# Patient Record
Sex: Female | Born: 1975 | Race: White | Hispanic: No | Marital: Married | Smoking: Never smoker
Health system: Southern US, Community
[De-identification: ages and names within clinical notes are randomized; demographics above are authoritative.]

## PROBLEM LIST (undated history)

## (undated) DIAGNOSIS — F32A Depression, unspecified: Secondary | ICD-10-CM

## (undated) DIAGNOSIS — E785 Hyperlipidemia, unspecified: Secondary | ICD-10-CM

## (undated) DIAGNOSIS — J309 Allergic rhinitis, unspecified: Secondary | ICD-10-CM

## (undated) DIAGNOSIS — F419 Anxiety disorder, unspecified: Secondary | ICD-10-CM

## (undated) DIAGNOSIS — K219 Gastro-esophageal reflux disease without esophagitis: Secondary | ICD-10-CM

## (undated) DIAGNOSIS — F329 Major depressive disorder, single episode, unspecified: Secondary | ICD-10-CM

## (undated) DIAGNOSIS — Z9221 Personal history of antineoplastic chemotherapy: Secondary | ICD-10-CM

## (undated) DIAGNOSIS — F319 Bipolar disorder, unspecified: Secondary | ICD-10-CM

## (undated) DIAGNOSIS — C801 Malignant (primary) neoplasm, unspecified: Secondary | ICD-10-CM

## (undated) DIAGNOSIS — E119 Type 2 diabetes mellitus without complications: Secondary | ICD-10-CM

## (undated) HISTORY — PX: KNEE SURGERY: SHX244

## (undated) HISTORY — PX: IMAGE GUIDED SINUS SURGERY: SHX6570

## (undated) HISTORY — PX: NASAL SEPTUM SURGERY: SHX37

## (undated) HISTORY — PX: REDUCTION MAMMAPLASTY: SUR839

## (undated) HISTORY — PX: TONSILLECTOMY: SUR1361

---

## 2003-10-05 ENCOUNTER — Other Ambulatory Visit: Payer: Self-pay

## 2005-05-28 ENCOUNTER — Ambulatory Visit: Payer: Self-pay | Admitting: Unknown Physician Specialty

## 2008-02-03 ENCOUNTER — Encounter: Payer: Self-pay | Admitting: Family Medicine

## 2008-02-18 ENCOUNTER — Encounter: Payer: Self-pay | Admitting: Family Medicine

## 2008-03-20 ENCOUNTER — Encounter: Payer: Self-pay | Admitting: Family Medicine

## 2008-06-07 ENCOUNTER — Encounter: Payer: Self-pay | Admitting: Family Medicine

## 2008-06-20 ENCOUNTER — Encounter: Payer: Self-pay | Admitting: Family Medicine

## 2008-07-20 ENCOUNTER — Encounter: Payer: Self-pay | Admitting: Family Medicine

## 2009-03-22 ENCOUNTER — Ambulatory Visit: Payer: Self-pay

## 2011-11-21 ENCOUNTER — Emergency Department: Payer: Self-pay | Admitting: Emergency Medicine

## 2011-11-21 LAB — CBC
HCT: 38.4 % (ref 35.0–47.0)
HGB: 12.7 g/dL (ref 12.0–16.0)
MCH: 27.9 pg (ref 26.0–34.0)
MCV: 84 fL (ref 80–100)
Platelet: 352 10*3/uL (ref 150–440)
RBC: 4.56 10*6/uL (ref 3.80–5.20)
RDW: 13.9 % (ref 11.5–14.5)

## 2011-11-22 ENCOUNTER — Emergency Department: Payer: Self-pay | Admitting: Emergency Medicine

## 2011-11-23 ENCOUNTER — Emergency Department: Payer: Self-pay | Admitting: Emergency Medicine

## 2011-11-26 ENCOUNTER — Emergency Department: Payer: Self-pay | Admitting: Emergency Medicine

## 2011-11-26 LAB — URINALYSIS, COMPLETE
Bilirubin,UR: NEGATIVE
Nitrite: NEGATIVE
Ph: 6 (ref 4.5–8.0)
RBC,UR: 59 /HPF (ref 0–5)
Specific Gravity: 1.031 (ref 1.003–1.030)
Squamous Epithelial: 12
WBC UR: 21 /HPF (ref 0–5)

## 2011-11-26 LAB — CBC
HCT: 35.3 %
HGB: 11.6 g/dL — ABNORMAL LOW
MCH: 27.9 pg
MCHC: 32.8 g/dL
MCV: 85 fL
Platelet: 324 x10 3/mm 3
RBC: 4.15 X10 6/mm 3
RDW: 13.1 %
WBC: 11.1 x10 3/mm 3 — ABNORMAL HIGH

## 2011-12-17 ENCOUNTER — Encounter: Payer: Self-pay | Admitting: Maternal & Fetal Medicine

## 2012-04-21 LAB — ETHANOL: Ethanol %: <0.003 % (ref 0.000–0.080)

## 2012-04-21 LAB — TSH: Thyroid Stimulating Horm: 3.33 u[IU]/mL

## 2012-04-21 LAB — DRUG SCREEN, URINE
Amphetamines, Ur Screen: NEGATIVE (ref ?–1000)
Barbiturates, Ur Screen: NEGATIVE (ref ?–200)
Benzodiazepine, Ur Scrn: NEGATIVE (ref ?–200)
Methadone, Ur Screen: NEGATIVE (ref ?–300)
Tricyclic, Ur Screen: NEGATIVE (ref ?–1000)

## 2012-04-21 LAB — URINALYSIS, COMPLETE
Blood: NEGATIVE
Glucose,UR: 50 mg/dL (ref 0–75)
Ph: 6 (ref 4.5–8.0)
Protein: NEGATIVE
RBC,UR: <1 /HPF (ref 0–5)
Specific Gravity: 1.004 (ref 1.003–1.030)
Squamous Epithelial: 1

## 2012-04-21 LAB — COMPREHENSIVE METABOLIC PANEL WITH GFR
Albumin: 2.6 g/dL — ABNORMAL LOW (ref 3.4–5.0)
Alkaline Phosphatase: 98 U/L (ref 50–136)
Anion Gap: 10 (ref 7–16)
BUN: 5 mg/dL — ABNORMAL LOW (ref 7–18)
Bilirubin,Total: 0.6 mg/dL (ref 0.2–1.0)
Calcium, Total: 8.6 mg/dL (ref 8.5–10.1)
Chloride: 105 mmol/L (ref 98–107)
Co2: 21 mmol/L (ref 21–32)
Creatinine: 0.49 mg/dL — ABNORMAL LOW (ref 0.60–1.30)
EGFR (African American): >60
EGFR (Non-African Amer.): >60
Glucose: 117 mg/dL — ABNORMAL HIGH (ref 65–99)
Osmolality: 270 (ref 275–301)
Potassium: 3.7 mmol/L (ref 3.5–5.1)
SGOT(AST): 13 U/L — ABNORMAL LOW (ref 15–37)
SGPT (ALT): 7 U/L — ABNORMAL LOW (ref 12–78)
Sodium: 136 mmol/L (ref 136–145)
Total Protein: 6.8 g/dL (ref 6.4–8.2)

## 2012-04-21 LAB — CBC
HGB: 11.3 g/dL — ABNORMAL LOW (ref 12.0–16.0)
MCH: 27.8 pg (ref 26.0–34.0)
MCHC: 33.6 g/dL (ref 32.0–36.0)
Platelet: 354 10*3/uL (ref 150–440)
RBC: 4.08 10*6/uL (ref 3.80–5.20)
RDW: 14.5 % (ref 11.5–14.5)

## 2012-04-22 ENCOUNTER — Inpatient Hospital Stay: Payer: Self-pay | Admitting: Psychiatry

## 2012-06-08 ENCOUNTER — Inpatient Hospital Stay: Payer: Self-pay | Admitting: Obstetrics and Gynecology

## 2012-06-08 LAB — CBC WITH DIFFERENTIAL/PLATELET
Eosinophil #: 0.1 10*3/uL (ref 0.0–0.7)
HCT: 32.5 % — ABNORMAL LOW (ref 35.0–47.0)
HGB: 10.9 g/dL — ABNORMAL LOW (ref 12.0–16.0)
MCH: 26.9 pg (ref 26.0–34.0)
MCHC: 33.5 g/dL (ref 32.0–36.0)
MCV: 80 fL (ref 80–100)
Monocyte #: 0.7 x10 3/mm (ref 0.2–0.9)
Neutrophil #: 7.1 10*3/uL — ABNORMAL HIGH (ref 1.4–6.5)
Neutrophil %: 67.1 %
Platelet: 205 10*3/uL (ref 150–440)
RBC: 4.05 10*6/uL (ref 3.80–5.20)
RDW: 14.6 % — ABNORMAL HIGH (ref 11.5–14.5)

## 2012-06-08 LAB — COMPREHENSIVE METABOLIC PANEL
Albumin: 1.9 g/dL — ABNORMAL LOW (ref 3.4–5.0)
Anion Gap: 11 (ref 7–16)
BUN: 12 mg/dL (ref 7–18)
Calcium, Total: 8.5 mg/dL (ref 8.5–10.1)
Creatinine: 0.56 mg/dL — ABNORMAL LOW (ref 0.60–1.30)
Glucose: 74 mg/dL (ref 65–99)
Osmolality: 280 (ref 275–301)
Potassium: 3.6 mmol/L (ref 3.5–5.1)
SGPT (ALT): 20 U/L (ref 12–78)
Sodium: 141 mmol/L (ref 136–145)
Total Protein: 5.4 g/dL — ABNORMAL LOW (ref 6.4–8.2)

## 2012-06-08 LAB — PROTEIN / CREATININE RATIO, URINE: Protein, Random Urine: >2000 mg/dL — ABNORMAL HIGH (ref 0–12)

## 2012-06-08 LAB — DRUG SCREEN, URINE
Amphetamines, Ur Screen: NEGATIVE (ref ?–1000)
Benzodiazepine, Ur Scrn: NEGATIVE (ref ?–200)
MDMA (Ecstasy)Ur Screen: NEGATIVE (ref ?–500)
Methadone, Ur Screen: NEGATIVE (ref ?–300)
Opiate, Ur Screen: POSITIVE (ref ?–300)
Tricyclic, Ur Screen: NEGATIVE (ref ?–1000)

## 2012-06-08 LAB — TROPONIN I: Troponin-I: <0.02 ng/mL

## 2012-06-08 LAB — LIPASE, BLOOD: Lipase: 215 U/L (ref 73–393)

## 2012-06-09 LAB — URINALYSIS, COMPLETE
Bacteria: NONE SEEN
Bilirubin,UR: NEGATIVE
Blood: NEGATIVE
Glucose,UR: NEGATIVE mg/dL (ref 0–75)
Hyaline Cast: 4
Leukocyte Esterase: NEGATIVE
Nitrite: NEGATIVE
Ph: 6 (ref 4.5–8.0)
Protein: >=500
RBC,UR: 3 /HPF (ref 0–5)
Specific Gravity: 1.03 (ref 1.003–1.030)
Squamous Epithelial: 1
WBC UR: 7 /HPF (ref 0–5)

## 2012-06-09 LAB — MAGNESIUM: Magnesium: 6 mg/dL — ABNORMAL HIGH

## 2012-06-10 LAB — HEMATOCRIT: HCT: 31.4 % — ABNORMAL LOW (ref 35.0–47.0)

## 2013-10-02 ENCOUNTER — Encounter: Payer: Self-pay | Admitting: Family Medicine

## 2013-10-18 ENCOUNTER — Encounter: Payer: Self-pay | Admitting: Family Medicine

## 2014-01-20 LAB — CBC WITH DIFFERENTIAL/PLATELET
Basophil #: 0.1 10*3/uL (ref 0.0–0.1)
Basophil %: 0.9 %
EOS PCT: 3.6 %
Eosinophil #: 0.4 10*3/uL (ref 0.0–0.7)
HCT: 44.2 % (ref 35.0–47.0)
HGB: 14.7 g/dL (ref 12.0–16.0)
Lymphocyte #: 3.7 10*3/uL — ABNORMAL HIGH (ref 1.0–3.6)
Lymphocyte %: 36.5 %
MCH: 28.4 pg (ref 26.0–34.0)
MCHC: 33.2 g/dL (ref 32.0–36.0)
MCV: 86 fL (ref 80–100)
Monocyte #: 0.6 x10 3/mm (ref 0.2–0.9)
Monocyte %: 6.4 %
NEUTROS ABS: 5.3 10*3/uL (ref 1.4–6.5)
Neutrophil %: 52.6 %
Platelet: 345 10*3/uL (ref 150–440)
RBC: 5.17 10*6/uL (ref 3.80–5.20)
RDW: 12.8 % (ref 11.5–14.5)
WBC: 10.1 10*3/uL (ref 3.6–11.0)

## 2014-01-20 LAB — COMPREHENSIVE METABOLIC PANEL
ALK PHOS: 108 U/L
AST: 13 U/L — AB (ref 15–37)
Albumin: 3.2 g/dL — ABNORMAL LOW (ref 3.4–5.0)
Anion Gap: 7 (ref 7–16)
BUN: 11 mg/dL (ref 7–18)
Bilirubin,Total: 0.4 mg/dL (ref 0.2–1.0)
Calcium, Total: 8.8 mg/dL (ref 8.5–10.1)
Chloride: 100 mmol/L (ref 98–107)
Co2: 24 mmol/L (ref 21–32)
Creatinine: 0.65 mg/dL (ref 0.60–1.30)
EGFR (Non-African Amer.): >60
Glucose: 453 mg/dL — ABNORMAL HIGH (ref 65–99)
Osmolality: 282 (ref 275–301)
Potassium: 3.9 mmol/L (ref 3.5–5.1)
SGPT (ALT): 26 U/L (ref 12–78)
Sodium: 131 mmol/L — ABNORMAL LOW (ref 136–145)
Total Protein: 7.4 g/dL (ref 6.4–8.2)

## 2014-01-20 LAB — URINALYSIS, COMPLETE
BLOOD: NEGATIVE
Bacteria: NONE SEEN
Bilirubin,UR: NEGATIVE
Glucose,UR: >=500 mg/dL (ref 0–75)
Leukocyte Esterase: NEGATIVE
Nitrite: NEGATIVE
Ph: 6 (ref 4.5–8.0)
Protein: NEGATIVE
RBC,UR: <1 /HPF (ref 0–5)
Specific Gravity: 1.038 (ref 1.003–1.030)
Squamous Epithelial: 2

## 2014-01-21 ENCOUNTER — Inpatient Hospital Stay: Payer: Self-pay | Admitting: Surgery

## 2014-01-21 LAB — PREGNANCY, URINE: Pregnancy Test, Urine: NEGATIVE m[IU]/mL

## 2014-01-21 LAB — HEMOGLOBIN A1C: Hemoglobin A1C: 12.8 % — ABNORMAL HIGH (ref 4.2–6.3)

## 2014-01-21 LAB — LIPASE, BLOOD: Lipase: 539 U/L — ABNORMAL HIGH (ref 73–393)

## 2014-01-22 LAB — PATHOLOGY REPORT

## 2014-01-24 LAB — BASIC METABOLIC PANEL
Anion Gap: 10 (ref 7–16)
BUN: 2 mg/dL — AB (ref 7–18)
CHLORIDE: 103 mmol/L (ref 98–107)
CREATININE: 0.42 mg/dL — AB (ref 0.60–1.30)
Calcium, Total: 8 mg/dL — ABNORMAL LOW (ref 8.5–10.1)
Co2: 22 mmol/L (ref 21–32)
EGFR (African American): >60
EGFR (Non-African Amer.): >60
Glucose: 226 mg/dL — ABNORMAL HIGH (ref 65–99)
Osmolality: 273 (ref 275–301)
POTASSIUM: 3.4 mmol/L — AB (ref 3.5–5.1)
SODIUM: 135 mmol/L — AB (ref 136–145)

## 2014-01-24 LAB — CBC WITH DIFFERENTIAL/PLATELET
BASOS PCT: 0.4 %
Basophil #: 0 10*3/uL (ref 0.0–0.1)
EOS ABS: 0.2 10*3/uL (ref 0.0–0.7)
Eosinophil %: 1.8 %
HCT: 35.6 % (ref 35.0–47.0)
HGB: 11.8 g/dL — AB (ref 12.0–16.0)
LYMPHS PCT: 13.2 %
Lymphocyte #: 1.4 10*3/uL (ref 1.0–3.6)
MCH: 28.3 pg (ref 26.0–34.0)
MCHC: 33.1 g/dL (ref 32.0–36.0)
MCV: 86 fL (ref 80–100)
Monocyte #: 0.6 x10 3/mm (ref 0.2–0.9)
Monocyte %: 5.7 %
Neutrophil #: 8.6 10*3/uL — ABNORMAL HIGH (ref 1.4–6.5)
Neutrophil %: 78.9 %
Platelet: 305 10*3/uL (ref 150–440)
RBC: 4.16 10*6/uL (ref 3.80–5.20)
RDW: 13 % (ref 11.5–14.5)
WBC: 11 10*3/uL (ref 3.6–11.0)

## 2014-02-08 ENCOUNTER — Other Ambulatory Visit: Payer: Self-pay | Admitting: Surgery

## 2014-03-29 ENCOUNTER — Emergency Department: Payer: Self-pay | Admitting: Emergency Medicine

## 2014-03-29 LAB — URINALYSIS, COMPLETE
Bacteria: NONE SEEN
Bilirubin,UR: NEGATIVE
Blood: NEGATIVE
Glucose,UR: >=500 mg/dL (ref 0–75)
Leukocyte Esterase: NEGATIVE
Nitrite: NEGATIVE
PROTEIN: NEGATIVE
Ph: 6 (ref 4.5–8.0)
SPECIFIC GRAVITY: 1.04 (ref 1.003–1.030)
WBC UR: 8 /HPF (ref 0–5)

## 2014-03-29 LAB — COMPREHENSIVE METABOLIC PANEL
ALBUMIN: 3 g/dL — AB (ref 3.4–5.0)
Alkaline Phosphatase: 100 U/L
Anion Gap: 12 (ref 7–16)
BUN: 6 mg/dL — ABNORMAL LOW (ref 7–18)
Bilirubin,Total: 0.6 mg/dL (ref 0.2–1.0)
CALCIUM: 9 mg/dL (ref 8.5–10.1)
CHLORIDE: 100 mmol/L (ref 98–107)
Co2: 22 mmol/L (ref 21–32)
Creatinine: 0.85 mg/dL (ref 0.60–1.30)
EGFR (African American): >60
EGFR (Non-African Amer.): >60
Glucose: 337 mg/dL — ABNORMAL HIGH (ref 65–99)
OSMOLALITY: 279 (ref 275–301)
POTASSIUM: 3.4 mmol/L — AB (ref 3.5–5.1)
SGOT(AST): 14 U/L — ABNORMAL LOW (ref 15–37)
SGPT (ALT): 14 U/L
Sodium: 134 mmol/L — ABNORMAL LOW (ref 136–145)
TOTAL PROTEIN: 8.1 g/dL (ref 6.4–8.2)

## 2014-03-29 LAB — CBC WITH DIFFERENTIAL/PLATELET
BASOS ABS: 0 10*3/uL (ref 0.0–0.1)
BASOS PCT: 0.3 %
EOS ABS: 0.2 10*3/uL (ref 0.0–0.7)
Eosinophil %: 1.7 %
HCT: 42 % (ref 35.0–47.0)
HGB: 13.6 g/dL (ref 12.0–16.0)
Lymphocyte #: 2 10*3/uL (ref 1.0–3.6)
Lymphocyte %: 14.9 %
MCH: 27.5 pg (ref 26.0–34.0)
MCHC: 32.5 g/dL (ref 32.0–36.0)
MCV: 85 fL (ref 80–100)
Monocyte #: 0.7 x10 3/mm (ref 0.2–0.9)
Monocyte %: 5.5 %
NEUTROS ABS: 10.4 10*3/uL — AB (ref 1.4–6.5)
NEUTROS PCT: 77.6 %
Platelet: 402 10*3/uL (ref 150–440)
RBC: 4.96 10*6/uL (ref 3.80–5.20)
RDW: 13.1 % (ref 11.5–14.5)
WBC: 13.3 10*3/uL — ABNORMAL HIGH (ref 3.6–11.0)

## 2014-03-29 LAB — LIPASE, BLOOD: LIPASE: 108 U/L (ref 73–393)

## 2014-12-07 NOTE — Consult Note (Signed)
Brief Consult Note: Diagnosis: Mood disorder NOS.   Patient was seen by consultant.   Consult note dictated.   Recommend further assessment or treatment.   Orders entered.   Discussed with Attending MD.   Comments: Heather Bell has a h/o bipolar disorder. She stopped all her medications when became pregnant 6 months ago. She became increasingly depressed. Last night, she became suicidal with a plan to cut her wrist or use CO to poison herself.   PLAN: 1. The patient needs admission to psychiatry. No beds available.  2. We will start prozac for depression and restoril for sleep.  3. I will follow along.  Electronic Signatures: Orson Slick (MD)  (Signed 02-Sep-13 14:09)  Authored: Brief Consult Note   Last Updated: 02-Sep-13 14:09 by Orson Slick (MD)

## 2014-12-07 NOTE — Consult Note (Signed)
PATIENT NAME:  Heather Bell, Heather Bell MR#:  [id number] DATE OF BIRTH:  1975-12-20  DATE OF CONSULTATION:  04/21/2012  REFERRING PHYSICIAN:  Graciella Freer, MD  CONSULTING PHYSICIAN:  Kaelin Holford B. Santina Trillo, MD  REASON FOR CONSULTATION: To evaluate a suicidal patient.   IDENTIFYING DATA: Heather Bell is a 39 year old female with a history of bipolar disorder.   CHIEF COMPLAINT: "I have to be at home at 7".   HISTORY OF PRESENT ILLNESS: Heather Bell has a history of bipolar disorder. She stopped taking all her medications six months ago when she discovered that she was pregnant. She has been doing okay up until the day of admission when she has learned that her boyfriend of a year and a half and father of her baby, six months pregnant now, is leaving and does not want to have anything to do with her or the baby. She scratched her arm superficially but was thinking that since she is afraid of pain this would not work. She decided to kill herself by carbon monoxide poisoning. She came to her senses and came to the hospital asking for help. In the Emergency Room she is extremely tearful. She complains of her very difficult financial situation. She supports herself and her 50 year old daughter through food stamps and child support. She feels that she absolutely cannot afford another baby. She will most likely give it up for adoption. She reports many symptoms of depression with poor sleep, decreased appetite, anhedonia, feeling of guilt, hopelessness, worthlessness, poor energy and concentration, poor memory, social isolation, crying, suicidal ideation. She denies symptoms suggestive of bipolar mania. She denies psychotic symptoms. There is no alcohol, illicit substance, or prescription drug abuse.   PAST PSYCHIATRIC HISTORY: She has never been hospitalized. No suicide attempts. In the past she was treated with Effexor, Abilify, Topamax, and Elavil but stopped her medication during her current pregnancy. She has a  39 year old daughter but denies having symptoms of depression during previous pregnancy or postpartum.   FAMILY PSYCHIATRIC HISTORY: Many family members on her mother's side suffered depression. They were never diagnosed.   PAST MEDICAL HISTORY: Six months pregnant.   ALLERGIES: Compazine.   MEDICATIONS ON ADMISSION: None.   SOCIAL HISTORY: She is divorced. She has a 53 year old daughter at home. She is not employed. She survives through subsidized housing, food stamps, and [employer]. She owns a car. She is already worried that she will not be able to pay electrical bill through the winter and is considering sending her 41 year old to live with her father who is supportive. She has very little social support. Her father has been ill and requires multiple surgeries. This is additional stress for the patient.   REVIEW OF SYSTEMS: CONSTITUTIONAL: No fevers or chills. No weight changes. EYES: No double or blurred vision. ENT: No hearing loss. RESPIRATORY: No shortness of breath or cough. CARDIOVASCULAR: No chest pain or orthopnea. GASTROINTESTINAL: No abdominal pain, nausea, vomiting, or diarrhea. GU: No incontinence or frequency. ENDOCRINE: No heat or cold intolerance. LYMPHATIC: No anemia or easy bruising. INTEGUMENTARY: No acne or rash. MUSCULOSKELETAL: No muscle or joint pain. NEUROLOGIC: No tingling or weakness. PSYCHIATRIC: See history of present illness for details.   PHYSICAL EXAMINATION:   VITAL SIGNS: Blood pressure 114/75, pulse 88, respirations 18, temperature 98.2.   GENERAL: This is a slender female very tearful. The rest of the physical examination is deferred to her primary attending.   LABORATORY DATA: Chemistries within normal limits except for blood glucose of 117. Blood alcohol level 0.  LFTs within normal limits. TSH 3.33. Urine tox screen negative for substances. CBC within normal limits except for white blood count of 14.2. Urinalysis is not suggestive of urinary tract infection.  Serum acetaminophen and salicylates are low.   MENTAL STATUS EXAMINATION: The patient is alert and oriented to person, place, time, and situation. She is pleasant, polite, and cooperative. She is extremely tearful. She maintains good eye contact. She is in bed wearing hospital scrubs. Her speech is soft. Mood is depressed with flat affect. Thought processing is logical and goal oriented. Thought content she denies suicidal or homicidal ideation at the moment but if discharged she would still consider poisoning with carbon monoxide. There are no delusions or paranoia. There are no auditory or visual hallucinations. Her cognition is grossly intact. She registers 3 out of 3 and recalls 3 out of 3 objects after five minutes. She can spell world forward and backward. She knows the current president. Her insight and judgment are questionable.   SUICIDE RISK ASSESSMENT: This is a patient with a history of depression, mood instability, and multiple social problems who cut her wrist and is considering suicide with carbon monoxide poisoning.   DIAGNOSES:  AXIS I: Major depressive disorder, recurrent, severe versus bipolar affective disorder, depressed.   AXIS II: Deferred.   AXIS III: Six months pregnant.   AXIS IV: Mental illness, pregnancy, financial, employment, primary support, access to care.   AXIS V: GAF on admission 25.   PLAN:  1. The patient needs admission to Psychiatry. Will admit for the first bed available. 2. We started Prozac for depression and Restoril for sleep.  3. I will follow-up.   ____________________________ Wardell Honour. Bary Leriche, MD jbp:[id number] D: 04/21/2012 16:48:31 ET T: 04/22/2012 09:20:36 ET JOB#: [id number]  cc: Ayahna Solazzo B. Bary Leriche, MD, <Dictator> Clovis Fredrickson MD ELECTRONICALLY SIGNED 04/23/2012 23:56

## 2014-12-07 NOTE — H&P (Signed)
PATIENT NAME:  Heather Bell, Heather Bell MR#:  [id number] DATE OF BIRTH:  [birth year]  DATE OF ADMISSION:  04/22/2012  REFERRING PHYSICIAN: Dr. Graciella Freer  ATTENDING PHYSICIAN: Karolyna Bianchini B. Bary Leriche, MD   IDENTIFYING DATA: Heather Bell is a 39 year old female with a history of bipolar disorder.   CHIEF COMPLAINT: "I need to go home."   HISTORY OF PRESENT ILLNESS: Heather Bell has a history of bipolar disorder. Six months ago when she became pregnant she stopped taking her medications. She has been doing all right until the day prior to admission when the father of her baby and boyfriend of a year and a half decided to leave her and did not want to have anything to with the patient or her baby. In response she scratched her arm superficially and came to the hospital but she was considering strongly poisoning herself and the baby with carbon monoxide. In the Emergency Room she was extremely tearful talking about her troubles. She is in a very difficult financial situation. She supports herself from child support and food stamps. She feels that she will not be able to afford another child. She has been considering giving this child for adoption. She reports many symptoms of depression that started apparently way before break-up with the boyfriend with poor sleep, decreased appetite, anhedonia, feeling of guilt, hopelessness, worthlessness, poor energy and concentration, poor memory, social isolation, crying and suicidal ideation. She denies symptoms suggestive of bipolar mania. She denies psychotic symptoms. There is no alcohol, illicit substance or prescription drug abuse.   PAST PSYCHIATRIC HISTORY: She has never been hospitalized. There were no suicide attempts. She was treated in the past with Effexor, Abilify, Topamax and Elavil until she became pregnant. She did not experience any mood changes during her previous pregnancy 13 years ago.   FAMILY PSYCHIATRIC HISTORY: On her mother's side multiple family members  with depression.   PAST MEDICAL HISTORY: Six months pregnant.   ALLERGIES: Compazine.   MEDICATIONS ON ADMISSION: None.   SOCIAL HISTORY: She is divorced. She lives with her 64 year old daughter. She is not employed. She survives through subsidized housing, food stamps and child support. She owns a car. Before all her troubles she was already worried about her ability to pay electrical bill during this winter and she feels that she will be forced to send her 41 year old to stay with her father who is very supportive. She herself has no social support except for the father who has been ill and will require multiple surgeries and needs to be helped himself.   REVIEW OF SYSTEMS: CONSTITUTIONAL: No fevers or chills. No weight changes. EYES: No double or blurred vision. ENT: No hearing loss. RESPIRATORY: No shortness of breath or cough. CARDIOVASCULAR: No chest pain or orthopnea. GASTROINTESTINAL: No abdominal pain, nausea, vomiting, or diarrhea. GENITOURINARY: No incontinence or frequency. ENDOCRINE: No heat or cold intolerance. LYMPHATIC: No anemia or easy bruising. INTEGUMENTARY: No acne or rash. MUSCULOSKELETAL: No muscle or joint pain. NEUROLOGIC: No tingling or weakness. PSYCHIATRIC: See history of present illness for details.   PHYSICAL EXAMINATION:  VITAL SIGNS: Blood pressure 133/66, pulse 100, respirations 20, temperature 98.6.   GENERAL: This is a well-developed female in no acute distress.   HEENT: The pupils are equal, round, and reactive to light. Sclerae anicteric.   NECK: Supple. No thyromegaly.   LUNGS: Clear to auscultation. No dullness to percussion.   HEART: Regular rhythm and rate. No murmurs, rubs, or gallops.   ABDOMEN: Soft, nontender, nondistended. Positive bowel sounds.  MUSCULOSKELETAL: Normal muscle strength in all extremities.   SKIN: No rashes or bruises.   LYMPHATIC: No cervical adenopathy.   NEUROLOGIC: Cranial nerves II through XII are intact.    LABORATORY, DIAGNOSTIC AND RADIOLOGICAL DATA: Chemistries are within normal limits except for blood glucose of 117. Blood alcohol level zero. LFTs within normal limits. TSH 3.33. Urine drug screen is negative for substances. CBC: White blood count 14.2, hemoglobin 11.3, hematocrit 33.7, platelets 354. Urinalysis is not suggestive of urinary tract infection. Serum acetaminophen and salicylates are low.   MENTAL STATUS EXAMINATION ON ADMISSION: The patient is alert and oriented to person, place, time, and situation. She is complaining about being on involuntary commitment and coming to the hospital. She is arguing that she needs to go home to take care of her daughter and her dogs. She is cooperative. She is tearful. She maintains good eye contact. She is wearing hospital scrubs and a yellow shirt. Her speech is of normal rhythm, rate, and volume, loud at times. Mood is depressed with labile affect. Thought processing is logical and goal oriented. Thought content: She denies suicidal or homicidal ideation but came to the hospital after scratching her arm and as of yesterday she still considered poisoning with carbon monoxide. There are no delusions or paranoia. There are no auditory or visual hallucinations. Her cognition is grossly intact. She registers three out of three and recalls three out of three objects after five minutes. She can spell world forwards and backwards. She knows current president. Her insight and judgment are questionable.   SUICIDE RISK ASSESSMENT ON ADMISSION: This is a patient with history of depression, mood instability and multiple severe social problems who cut her wrists and considered suicide by carbon monoxide poisoning.   DIAGNOSES:  AXIS I: Major depressive disorder, recurrent, severe versus bipolar affective disorder depressed.   AXIS II: Deferred.   AXIS III: Six months pregnant.   AXIS IV: Mental illness, pregnancy, financial, employment, primary support, access to  care.   AXIS V: GAF on admission 25.   PLAN: The patient was admitted to [place] unit for safety, stabilization and medication management. She was initially placed on suicide precautions and was closely monitored for any unsafe behaviors. She underwent full psychiatric and risk assessment. She received pharmacotherapy, individual and group psychotherapy, substance abuse counseling, and support from therapeutic milieu.  1. Suicidality: The patient denies today.  2. Mood: We started her on Zoloft with Ambien for sleep. Will monitor her symptoms. It is unclear whether or not patient requires a mood stabilizer. It is safe to add it now. Will discuss with the patient.  3. Disposition: She will be discharged to home when stable.   ____________________________ Herma Ard B. Wael Maestas, MD jbp:cms D: 04/22/2012 20:27:07 ET T: 04/23/2012 08:02:59 ET JOB#: [id number]  cc: Zabria Liss B. Bary Leriche, MD, <Dictator> Clovis Fredrickson MD ELECTRONICALLY SIGNED 04/23/2012 23:57

## 2014-12-11 NOTE — Consult Note (Signed)
PATIENT NAME:  Heather Bell, Heather Bell MR#:  [id number] DATE OF BIRTH:  [birth year]  DATE OF CONSULTATION:  01/21/2014  REFERRING PHYSICIAN: Consuela Mimes, MD  CONSULTING PHYSICIAN:  Belia Heman. Verdell Carmine, MD PRIMARY CARE PHYSICIAN: [facility].   REASON FOR CONSULTATION: Hyperglycemia.   HISTORY OF PRESENT ILLNESS: This is a 39 year old female who presented to the hospital with right lower quadrant abdominal pain and nausea. She is noted to have acute appendicitis based on her CT scan findings. The patient is going to the Operating Room later today. The patient was noted to be severely hyperglycemic with a random blood sugar on serum greater than 200. Hospitalist services were contacted for further treatment and evaluation. Upon further questioning, the patient does admit to having increasing thirst and some polyuria over the past few weeks, but did not think it was any different from her norm. She denies any weight loss. No other associated symptoms presently.   REVIEW OF SYSTEMS:  CONSTITUTIONAL: No documented fever. No weight gain or weight loss.  EYES: No blurred or double vision.  ENT: No tinnitus. No postnasal drip. No redness of the oropharynx.  RESPIRATORY: No cough, no wheeze, no hemoptysis, no dyspnea.  CARDIOVASCULAR: No chest pain, no orthopnea, no palpitations, no syncope.  GASTROINTESTINAL: Positive nausea. No vomiting. No diarrhea. Positive abdominal pain. No melena or hematochezia.  GENITOURINARY: No dysuria, no hematuria.  ENDOCRINE: Positive nocturia. Positive polyuria. Positive increased thirst. No heat or cold intolerance. HEMATOLOGIC:  No anemia, no bruising, no bleeding.  INTEGUMENTARY: No rashes. No lesions.  MUSCULOSKELETAL: No arthritis, no swelling, no gout.  NEUROLOGIC: No numbness or tingling. No ataxia. No seizure-type activity.  PSYCHIATRIC: Positive anxiety, depression. No ADD.   PAST MEDICAL HISTORY: Consistent with anxiety/depression and  GERD.  ALLERGIES: COMPAZINE AND MORPHINE.  SOCIAL HISTORY: No smoking. No alcohol abuse. No illicit drug abuse. Lives at home with her husband and two kids.   FAMILY HISTORY: Mother and father are both alive. Father has diabetes.   CURRENT MEDICATIONS: As follows: Ambien 10 mg at bedtime, amitriptyline 75 mg at bedtime, Brintellix 20 mg daily, cetirizine 10 mg daily, clindamycin topical to be applied b.i.d. Elidel 1% topical cream to be applied b.i.d., ranitidine 150 mg b.i.d., Topamax 100 mg daily.   PHYSICAL EXAMINATION: Presently is as follows:  VITAL SIGNS: Temperature 98, pulse 109, respirations 18, blood pressure 132/76, sats 94% on room air.  GENERAL: She is a pleasant -appearing female in no apparent distress.  HEENT: She is atraumatic, normocephalic. Extraocular muscles are intact. Pupils equal and reactive to light. Sclerae anicteric. No conjunctival injection. No pharyngeal erythema.  NECK: Supple. There is no jugular venous distention, no bruits, no lymphadenopathy, no thyromegaly.  HEART: Regular rate and rhythm, tachycardic. No murmurs, no rubs, no clicks.  LUNGS: Clear to auscultation bilaterally. No rales or rhonchi. No wheezes.  ABDOMEN: Soft, flat, tender in the right lower quadrant. No rebound. No rigidity. Good bowel sounds. No hepatosplenomegaly appreciated.  EXTREMITIES: No evidence of any cyanosis, clubbing, or peripheral edema. Has +2 pedal and radial pulses bilaterally.  NEUROLOGICAL: The patient is alert, awake, and oriented x 3 with no focal motor or sensory appreciated bilaterally.  SKIN: Moist and warm with no rashes appreciated.  LYMPHATIC: There is no cervical or axillary lymphadenopathy.   LABORATORY DATA: Showed a serum glucose of 453, BUN 11, creatinine 0.6, sodium 131, potassium 3.9, chloride 100, bicarbonate 24. LFTs within normal limits. White cell count 10.1, hemoglobin 14.7, hematocrit 44.2, platelet count  345. The patient did have a CT scan of the  abdomen and pelvis done with contrast, which showed an acute nonperforated appendicitis.   ASSESSMENT AND PLAN: This is a 39 year old female with a history of anxiety, depression, GERD, who presents to the hospital with a right lower quadrant abdominal pain with nausea and noted to have acute appendicitis. Incidentally, the patient was also noted to be severely hyperglycemic with blood sugars greater than 400 and likely has new-onset diabetes.    1. Hyperglycemia. This is likely due to new-onset diabetes. The patient does have a family history of it. Her random blood sugar is greater than 200. I will check a hemoglobin A1c. We will continue sliding scale insulin coverage for now as the patient is n.p.o. We will get a diabetic lifestyle referral. Once the patient starts taking orally. I can consider starting her on oral medications like metformin and glipizide. Follow blood sugars closely at this point.  2. Anxiety/depression. Continue with her  Brintellix  and Topamax.  3. Acute appendicitis. Continue Unasyn and the patient is going to the Operating Room later today. Continue care as per surgery.   Thank you so much for the consultation. We will follow along with you.   TIME SPENT: 45 minutes.   ____________________________ Belia Heman. Verdell Carmine, MD vjs:sg D: 01/21/2014 11:22:00 ET T: 01/21/2014 11:34:45 ET JOB#: [id number]  cc: Belia Heman. Verdell Carmine, MD, <Dictator> Henreitta Leber MD ELECTRONICALLY SIGNED 01/23/2014 22:00

## 2014-12-11 NOTE — Consult Note (Signed)
Brief Consult Note: Diagnosis: 1. Hyperglycemia - New onset DM 2. hx of Anxiety/Depression 3. Acute appendicitis.   Patient was seen by consultant.   Consult note dictated.   Recommend to proceed with surgery or procedure.   Orders entered.   Comments: 39 yo female w/ hx of anxiety/depression, GERD came in due to RLQ pain, nausea and noted to have acute appendicitis.  Incidentally pt. noted to be hyperglycemic and likely has new onset DM.   1. Hyperglycemia - likely due to New onset DM.  - random BS > 200.  Will check a hemoglobinA1c.  - will cont. SSI as pt. is NPO for now.  Diabetic Lifestyle referral.  - once taking PO will try oral meds (Metformin, Glipizide).   2. Anxiety/Depression - cont. Brintellix, Topomax.   3. Acute Appendicitis - cont. Unasyn and going to OR later today.  - cont. care as per surgery.   Thanks for the consult and will follow with you.   Job # [id number].  Electronic Signatures: Henreitta Leber (MD)  (Signed 04-Jun-15 11:22)  Authored: Brief Consult Note   Last Updated: 04-Jun-15 11:22 by Henreitta Leber (MD)

## 2014-12-11 NOTE — H&P (Signed)
History of Present Illness Heather Bell with a few days of nausea, who began having RLQ abdominal pain 14 hours ago (5PM yesterday). No vomiting or anorexia. Pt is hungry: last solid food at midnight. No diarrhea.   Past Med/Surgical Hx:  suicidal ideations:   Post Traumatic Stress Disorder:   ADHD - Attention Deficit Disorder:   GERD - Esophageal Reflux:   Anxiety:   Bipolar Disorder:   Tonsillectomy:   Knee Surgery - Left:   ALLERGIES:  Compazine: Other  Morphine Sulfate: Other  HOME MEDICATIONS: Medication Instructions Status  ranitidine 150 mg oral capsule 1 cap(s) orally 2 times a day Active  cetirizine 10 mg oral tablet 1 tab(s) orally once a day Active  Brintellix 20 mg oral tablet 1 tab(s) orally once a day Active  Lo Loestrin Fe biphasic 10 mcg-1 mg oral tablet 1 tab(s) orally once a day Active  cetirizine 10 mg oral capsule 1 cap(s) orally once a day Active  Topamax 100 mg oral tablet 1 tab(s) orally once a day Active  Ambien 10 mg oral tablet 1 tab(s) orally once a day (at bedtime) Active  amitriptyline 75 mg oral tablet 1 tab(s) orally once a day (at bedtime) Active  Elidel 1% topical cream Apply topically to affected area 2 times a day Active  clindamycin topical 1% topical lotion Apply topically to affected area 2 times a day Active   Family and Social History:  Family History Non-Contributory   Social History negative tobacco, negative ETOH, negative Illicit drugs   Place of Living lives with boyfriend and two kids, unemployed   Review of Systems:  Fever/Chills No   Cough No   Sputum No   Abdominal Pain Yes   Diarrhea No   Constipation No   Nausea/Vomiting Yes   SOB/DOE No   Chest Pain No   Dysuria No   Tolerating PT Yes   Tolerating Diet Yes  Nauseated   Medications/Allergies Reviewed Medications/Allergies reviewed   Physical Exam:  GEN well developed, well nourished   HEENT pink conjunctivae, PERRL, hearing intact to voice, moist oral  mucosa, Oropharynx clear   NECK supple  thyroid not tender  trachea midline   RESP normal resp effort  clear BS  no use of accessory muscles   CARD regular rate  no murmur  No LE edema  no JVD  no Rub   ABD positive tenderness  RLQ tenderness with rebound   EXTR negative cyanosis/clubbing, negative edema   SKIN normal to palpation, skin turgor good   NEURO cranial nerves intact, negative tremor, follows commands   PSYCH alert, A+O to time, place, person   Lab Results: Hepatic:  03-Jun-15 23:04   Bilirubin, Total 0.4  Alkaline Phosphatase 108 (45-117 NOTE: New Reference Range 07/10/13)  SGPT (ALT) 26  SGOT (AST)  13  Total Protein, Serum 7.4  Albumin, Serum  3.2  Routine Chem:  03-Jun-15 23:03   Lipase  539 (Result(s) reported on 21 Jan 2014 at 03:27AM.)    23:04   Glucose, Serum  453  BUN 11  Creatinine (comp) 0.65  Sodium, Serum  131  Potassium, Serum 3.9  Chloride, Serum 100  CO2, Serum 24  Calcium (Total), Serum 8.8  Osmolality (calc) 282  eGFR (African American) >60  eGFR (Non-African American) >60 (eGFR values <97m/min/1.73 m2 may be an indication of chronic kidney disease (CKD). Calculated eGFR is useful in patients with stable renal function. The eGFR calculation will not be reliable in acutely ill  patients when serum creatinine is changing rapidly. It is not useful in  patients on dialysis. The eGFR calculation may not be applicable to patients at the low and high extremes of body sizes, pregnant women, and vegetarians.)  Anion Gap 7  Routine UA:  03-Jun-15 23:04   Color (UA) Straw  Clarity (UA) Clear  Glucose (UA) >=500  Bilirubin (UA) Negative  Ketones (UA) Trace  Specific Gravity (UA) 1.038  Blood (UA) Negative  pH (UA) 6.0  Protein (UA) Negative  Nitrite (UA) Negative  Leukocyte Esterase (UA) Negative (Result(s) reported on 20 Jan 2014 at 11:23PM.)  RBC (UA) <1 /HPF  WBC (UA) 1 /HPF  Bacteria (UA) NONE SEEN  Epithelial Cells (UA) 2 /HPF  (Result(s) reported on 20 Jan 2014 at 11:23PM.)  Routine Sero:  03-Jun-15 23:04   Pregnancy Test, Urine NEGATIVE (The results of the qualitative urine HCG (Pregnancy Test) should be evaluated in light of other clinical information.  There are limitations to the test which, in certain clinical situations, may result in a false positive or negative result. Thehigh dose hook effect can occur in urine samples with extremely high HCG concentrations.  This effect can produce a negative result in certain situations. It is suggested that results of the qualitative HCG be confirmed by an alternate methodology, such as the quantitative serum beta HCG test.)  Routine Hem:  03-Jun-15 23:04   WBC (CBC) 10.1  RBC (CBC) 5.17  Hemoglobin (CBC) 14.7  Hematocrit (CBC) 44.2  Platelet Count (CBC) 345  MCV 86  MCH 28.4  MCHC 33.2  RDW 12.8  Neutrophil % 52.6  Lymphocyte % 36.5  Monocyte % 6.4  Eosinophil % 3.6  Basophil % 0.9  Neutrophil # 5.3  Lymphocyte #  3.7  Monocyte # 0.6  Eosinophil # 0.4  Basophil # 0.1 (Result(s) reported on 20 Jan 2014 at 11:23PM.)   Radiology Results: LabUnknown:    04-Jun-15 06:05, CT Abdomen and Pelvis With Contrast  PACS Image  CT:  CT Abdomen and Pelvis With Contrast  REASON FOR EXAM:    (1) rlq pain, tender, +rebound; (2) rlq pain, tender,   +rebound;    NOTE: Nursing  COMMENTS:   May transport without cardiac monitor    PROCEDURE: CT  - CT ABDOMEN / PELVIS  W  - Jan 21 2014  6:05AM     CLINICAL DATA:  Right lower quadrant pain.    EXAM:  CT ABDOMEN AND PELVIS WITH CONTRAST    TECHNIQUE:  Multidetector CT imaging of the abdomen and pelvis was performed  using the standard protocol following bolus administration of  intravenous contrast.  CONTRAST:  100 cc Isovue 370 intravenous    COMPARISON:  None.    FINDINGS:  BODY WALL: Unremarkable.    LOWER CHEST: Unremarkable.    ABDOMEN/PELVIS:    Liver: No focal abnormality.  Probable diffuse  fatty infiltration.    Biliary: No evidence of biliary obstruction or stone.  Pancreas: Unremarkable.    Spleen: Unremarkable.    Adrenals: Unremarkable.    Kidneys and ureters: No hydronephrosis or stone.    Bladder: Unremarkable.    Reproductive: Unremarkable.    Bowel: The proximal portion of the appendix is dilated by  low-density material up to 14 mm. Based on size, this is likely  fluid rather than a low-density mass. There is periappendiceal fat  inflammation. No abscess or pneumoperitoneum. No bowel obstruction.    Retroperitoneum: No mass or adenopathy.    Peritoneum: No  free fluid or gas.    Vascular: No acute abnormality.    OSSEOUS: No acute abnormalities.    Critical Value/emergent results were called by telephone at the time  of interpretation on 01/21/2014 at 6:28 AM to Dr. Carrie Mew ,  who verbally acknowledged these results.     IMPRESSION:  Acute, non perforated appendicitis.      Electronically Signed    By: Jorje Guild M.D.    On: 01/21/2014 06:29         Verified By: Gilford Silvius, M.D.,    Assessment/Admission Diagnosis Acute appendicitis   Plan Lap appy   Electronic Signatures: Consuela Mimes (MD)  (Signed 04-Jun-15 07:18)  Authored: CHIEF COMPLAINT and HISTORY, PAST MEDICAL/SURGIAL HISTORY, ALLERGIES, HOME MEDICATIONS, FAMILY AND SOCIAL HISTORY, REVIEW OF SYSTEMS, PHYSICAL EXAM, LABS, Radiology, ASSESSMENT AND PLAN   Last Updated: 04-Jun-15 07:18 by Consuela Mimes (MD)

## 2014-12-11 NOTE — Op Note (Signed)
PATIENT NAME:  [name], [name] MR#:  [id number] DATE OF BIRTH:  1975/11/12  DATE OF PROCEDURE:  01/21/2014  PREOPERATIVE DIAGNOSIS: Acute appendicitis.   POSTOPERATIVE DIAGNOSIS: Acute appendicitis.   PROCEDURE PERFORMED: Laparoscopic appendectomy.   SURGEON: Marlyce Huge, MD  ANESTHESIA: General.   ESTIMATED BLOOD LOSS: 25 mL.  COMPLICATIONS: None.   SPECIMEN: Appendix.   INDICATION FOR SURGERY: [name] is a pleasant 39 year old female who presents with acute onset right lower quadrant pain, leukocytosis and a CT scan concerning for appendicitis. She was brought to the operating room suite for laparoscopic appendectomy.   DETAILS OF PROCEDURE: Informed consent was obtained. [name] was brought to the operating room suite. She was induced. Endotracheal tube was placed. General anesthesia was administered. Her abdomen was then prepped and draped in standard surgical fashion. A timeout was then performed correctly identifying the patient's name, operative site, and procedure to be performed. A supraumbilical incision was made and deepened down to the fascia. The fascia was incised. The peritoneum was entered. Two stay sutures were placed through the fasciotomy. A Hassan trocar was placed through the fasciotomy. The abdomen was insufflated. Under direct visualization, left lower quadrant and right suprapubic 5 mm trocars were placed. The abdomen was then examined. The appendix was visualized. There was not a large amount of inflammation, however, there was thickening of the adjacent mesoappendix and mesoappendiceal fat and upon grabbing this bulbous area, in the more proximal appendix, there was a small amount of purulence. I then proceeded to make 2 holes in the mesoappendix, 3 in which I placed staplers and ligated the mesoappendix. I then used a hook to clear the mesoappendix, at the base of the appendix. I then placed a laparoscopic stapler across the base of the appendix.  The appendix was then taken out with an Endo Catch bag. I then examined the mesoappendix for bleeding. There was a small amount of hemorrhage from the mesoappendix, which was controlled with application of laparoscopic clips. There was a small amount of oozing at which time Surgicel was placed into the wound bed of the mesoappendix. I then looked at the staple line from the base of the appendix and it appeared to be hemostatic and adequate. Hemostasis was obtained. The trocars were taken out under direct visualization. The abdomen was desufflated. The supraumbilical fascia was closed with figure-of-eight 0 Vicryl. All port sites were closed at the skin with interrupted 4-0 Monocryl deep dermal. Steri-Strips, Telfa gauze and Tegaderm were then placed across the wound. The patient was then awoken, extubated and brought to the postanesthesia care unit. There were no immediate complications. Needle, sponge, and instrument counts were correct at the end of the procedure. ____________________________ Heather Bell. Heather Zetino, MD cal:sb D: 01/22/2014 09:23:26 ET T: 01/22/2014 09:53:43 ET JOB#: [id number]  cc: Heather Gave A. Rosette Bellavance, MD, <Dictator> Floyde Parkins MD ELECTRONICALLY SIGNED 01/23/2014 13:31

## 2014-12-11 NOTE — Discharge Summary (Signed)
PATIENT NAME:  [name], [name] MR#:  [id number] DATE OF BIRTH:  08/23/1975  DATE OF ADMISSION:  01/21/2014 DATE OF DISCHARGE:  01/25/2014  DISCHARGE DIAGNOSES: 1.  Acute appendicitis.  2.  Diabetes mellitus, newly diagnosed. 3.  History of posttraumatic stress disorder.  4.  History of attention deficit/hyperactivity disorder.  5.  History of gastroesophageal reflux.  6.  History of anxiety.  7.  History of bipolar disorder.  8.  History of tonsillectomy. 9.  Knee surgery.   PROCEDURE PERFORMED:  Laparoscopic appendectomy.   DISCHARGE MEDICATIONS:  Ranitidine 150 mg by mouth twice daily, Brintellix 20 mg by mouth daily, Lo Estren, iron, biphasic 10 mcg/1 oral tablet by mouth daily, cetirizine 10 mg by mouth daily, Topamax 100 mg by mouth daily, Ambien 10 mg by mouth at bedtime, amitriptyline 75 mg by mouth at bedtime, Percocet 1 tab by mouth q. 6 hours as needed pain.  Metformin 1000 mg twice daily, glipizide 2.5 mg by mouth daily.   INDICATION FOR ADMISSION:  Heather Bell is a 39 year old female who was noted to have right lower quadrant pain, leukocytosis and CT findings consistent with acute appendicitis.  She thus was brought to the Operating Room suite for laparoscopic appendectomy.  She was also noted to have elevated blood sugars and was worked up for diabetes mellitus.   HOSPITAL COURSE:  Heather Bell was admitted and underwent laparoscopic appendectomy.  Postoperatively, she was noted to have significant pain and required PCA initially for pain which was discontinued just prior to discharge.  She at the time of discharge was taking good by mouth with good by mouth pain control, was voiding and stooling without difficulty.  Her blood sugars were much controlled also on oral hypoglycemic agents.  Of note, her hemoglobin A1c was 12.8 at the time of admission.   DISCHARGE INSTRUCTIONS:  Are as follows:  Heather Bell is to follow up with me in approximately one week for routine follow-up.   She is also to follow up with [facility] where she obtains her medical care for workup and management of her newly diagnosed diabetes mellitus.  She is to call or return to the ED if has increased pain, nausea, vomiting, redness, drainage from incision.    ____________________________ Glena Norfolk Malva Diesing, MD cal:ea D: 02/04/2014 21:09:04 ET T: 02/05/2014 05:46:29 ET JOB#: [id number]  cc: Harrell Gave A. Jessicalynn Deshong, MD, <Dictator> Floyde Parkins MD ELECTRONICALLY SIGNED 02/09/2014 10:48

## 2014-12-28 NOTE — H&P (Signed)
L&D Evaluation:  History:   HPI 39 yo G2P1001at [redacted]w[redacted]d by [facility] of 07/20/2012 presenting with intermitten LUQ pain that started earlier today.  Pain was positional, then suddenly exacerbated this evening.  Has experienced associated shortness of breath and called the ambulance.  In ambulance BP noted to be hypertensive 150-180/90-110 no history of CHTN or elevated BP's thus far this pregnancy.  She has noted increasing edema in the last 3 weeks.  Currently reports HA, no vision changes.  +FM, no LOF, no VB, no dysuria, no constipation, no diarrhea.  During course of evaluation patient reported feeling like her throat was swelling up no insect bites//sting, new food or allergen exposures  PRegnancy noteable for AMA, 1st trimester screen with DSR of 1:120 and negative follow up harmony.  Her 1-hr glucola was borderline elevated at 142, 3-hr 95, 166, 176, 134. Received influenza vaccination 05/29/12.  A negative (anti-D on new OB panel to weak to titer did receive rhogam early in pregnancy), RI, VZI, HIV neg, HBsAg neg, RPR NR    Presents with abdominal pain    Patient's Medical History Bipolar & ADD    Patient's Surgical History Knee surgery, sinus surgery, tonsillectomy    Medications Flexeril, prevident, zantac, zoloft, zyrtec    Allergies compazine, dust mites, mold, pollen    Social History none    Family History Non-Contributory   ROS:   ROS All systems were reviewed.  HEENT, CNS, GI, GU, Respiratory, CV, Renal and Musculoskeletal systems were found to be normal.   Exam:   Vital Signs BP >140/90    Urine Protein 3+, UA and Pr/Cr ratio pending    General In visible distress    Mental Status clear    Chest clear  no stridor    Heart normal sinus rhythm    Abdomen gravid, non-tender, some LUQ pain, no rebound, no guarding,    Estimated Fetal Weight Average for gestational age    Fetal Position vtx    Edema 2+    Reflexes 3+    Clonus negative    FHT normal rate with no  decels    Ucx absent   Plan:   Plan EFM/NST, monitor BP, 39 yo G2P1001 at [redacted]w[redacted]d presenting with chest pain/LUQ pain, elevated blood pressures and shortness of breath    Comments 1) Shortness of breath/CP     - ABG normal (obtained on non-rebreather)     - CXR cardiogenic pulomonary edema, read as mild cardiac enlargement     - Will obtain echo pending stabalization if does not rule in for pre-eclampsia     - Cardiac enzymes (negative) EKG by EMS NSR  2) Abdominal pain     - lactate normal on ABG     - Lipase, CMP, CBC     - Abdominal US once stabalized  3) HTN      - uric acid (CMP as above)     - monitor serial BP's     - Drug abuse urine stat     - Suspect possible severe pre-eclampsia given physical exam, will hold on magnesium until urine Pr/Cr ratio returns given pumonary edema,  may need to start regardless if consistent with preE and use diuretic   Electronic Signatures for Addendum Section:  Dorthula Nettles (MD) (Signed Addendum 20-Oct-13 20:53)  CBC normal no elevation in WBC.  Remains hypertensive adminsiter hydralzain 5mg  IV push once, repeat in 20 minutes if no response at a dose of 10mg  IV push, if no  reponse in additional 20 minutes adminsiter and additional 10mg  IV once.  Monitor oxygen saturation stop magnesium of crackles develop.  Will consider 10mg  IV lasix as well.   Electronic Signatures: Dorthula Nettles (MD)  (Signed 20-Oct-13 21:18)  Authored: L&D Evaluation   Last Updated: 20-Oct-13 21:18 by Dorthula Nettles (MD)

## 2015-08-01 ENCOUNTER — Other Ambulatory Visit: Payer: Self-pay | Admitting: Internal Medicine

## 2015-08-01 DIAGNOSIS — R740 Nonspecific elevation of levels of transaminase and lactic acid dehydrogenase [LDH]: Principal | ICD-10-CM

## 2015-08-01 DIAGNOSIS — R7401 Elevation of levels of liver transaminase levels: Secondary | ICD-10-CM

## 2015-08-05 ENCOUNTER — Ambulatory Visit
Admission: RE | Admit: 2015-08-05 | Discharge: 2015-08-05 | Disposition: A | Discharge status: Home / Self Care | Payer: Medicaid Other | Source: Ambulatory Visit | Attending: Internal Medicine | Admitting: Internal Medicine

## 2015-08-05 DIAGNOSIS — R7401 Elevation of levels of liver transaminase levels: Secondary | ICD-10-CM

## 2015-08-05 DIAGNOSIS — R740 Nonspecific elevation of levels of transaminase and lactic acid dehydrogenase [LDH]: Secondary | ICD-10-CM | POA: Insufficient documentation

## 2015-10-08 ENCOUNTER — Ambulatory Visit
Admission: EM | Admit: 2015-10-08 | Discharge: 2015-10-08 | Disposition: A | Discharge status: Home / Self Care | Payer: Medicaid Other | Attending: Family Medicine | Admitting: Family Medicine

## 2015-10-08 DIAGNOSIS — F329 Major depressive disorder, single episode, unspecified: Secondary | ICD-10-CM | POA: Diagnosis not present

## 2015-10-08 DIAGNOSIS — E119 Type 2 diabetes mellitus without complications: Secondary | ICD-10-CM | POA: Diagnosis not present

## 2015-10-08 DIAGNOSIS — Z7984 Long term (current) use of oral hypoglycemic drugs: Secondary | ICD-10-CM | POA: Diagnosis not present

## 2015-10-08 DIAGNOSIS — E785 Hyperlipidemia, unspecified: Secondary | ICD-10-CM | POA: Insufficient documentation

## 2015-10-08 DIAGNOSIS — K219 Gastro-esophageal reflux disease without esophagitis: Secondary | ICD-10-CM | POA: Insufficient documentation

## 2015-10-08 DIAGNOSIS — B2790 Infectious mononucleosis, unspecified without complication: Secondary | ICD-10-CM

## 2015-10-08 DIAGNOSIS — Z79899 Other long term (current) drug therapy: Secondary | ICD-10-CM | POA: Diagnosis not present

## 2015-10-08 DIAGNOSIS — J029 Acute pharyngitis, unspecified: Secondary | ICD-10-CM | POA: Diagnosis present

## 2015-10-08 DIAGNOSIS — J329 Chronic sinusitis, unspecified: Secondary | ICD-10-CM | POA: Diagnosis present

## 2015-10-08 HISTORY — DX: Depression, unspecified: F32.A

## 2015-10-08 HISTORY — DX: Gastro-esophageal reflux disease without esophagitis: K21.9

## 2015-10-08 HISTORY — DX: Major depressive disorder, single episode, unspecified: F32.9

## 2015-10-08 HISTORY — DX: Anxiety disorder, unspecified: F41.9

## 2015-10-08 HISTORY — DX: Hyperlipidemia, unspecified: E78.5

## 2015-10-08 HISTORY — DX: Allergic rhinitis, unspecified: J30.9

## 2015-10-08 HISTORY — DX: Type 2 diabetes mellitus without complications: E11.9

## 2015-10-08 LAB — CBC WITH DIFFERENTIAL/PLATELET
BASOS ABS: 0 10*3/uL (ref 0–0.1)
Basophils Relative: 0 %
EOS PCT: 0 %
Eosinophils Absolute: 0 10*3/uL (ref 0–0.7)
HEMATOCRIT: 36 % (ref 35.0–47.0)
HEMOGLOBIN: 11.6 g/dL — AB (ref 12.0–16.0)
LYMPHS PCT: 27 %
Lymphs Abs: 2.8 10*3/uL (ref 1.0–3.6)
MCH: 24.8 pg — ABNORMAL LOW (ref 26.0–34.0)
MCHC: 32.3 g/dL (ref 32.0–36.0)
MCV: 77 fL — AB (ref 80.0–100.0)
Monocytes Absolute: 0.6 10*3/uL (ref 0.2–0.9)
Monocytes Relative: 6 %
NEUTROS ABS: 7 10*3/uL — AB (ref 1.4–6.5)
NEUTROS PCT: 67 %
PLATELETS: 379 10*3/uL (ref 150–440)
RBC: 4.68 MIL/uL (ref 3.80–5.20)
RDW: 15.6 % — ABNORMAL HIGH (ref 11.5–14.5)
WBC: 10.4 10*3/uL (ref 3.6–11.0)

## 2015-10-08 LAB — MONONUCLEOSIS SCREEN: Mono Screen: POSITIVE — AB

## 2015-10-08 LAB — RAPID STREP SCREEN (MED CTR MEBANE ONLY): Streptococcus, Group A Screen (Direct): NEGATIVE

## 2015-10-08 MED ORDER — KETOROLAC TROMETHAMINE 60 MG/2ML IM SOLN
60.0000 mg | Freq: Once | INTRAMUSCULAR | Status: AC
  Administered 2015-10-08: 60 mg via INTRAMUSCULAR

## 2015-10-08 NOTE — ED Provider Notes (Signed)
CSN: [id number]     Arrival date & time 10/08/15  1535 History   None    Chief Complaint  Patient presents with  . Sore Throat  . Sinusitis   (Consider location/radiation/quality/duration/timing/severity/associated sxs/prior Treatment) HPI  40 yo F with sore throat and malaise increasing over past week. Tired, myalgia. Afebrile Appetite OK but solid intake decreased with sore throat- took Ibuprofen last evening . Nothing today  Hx of frequent sinus and throat infections -underwent tonsillectomy at age 55. ( reports 6 day admit post op w/ comps) Improved overall but still gets  sinusitis intermittently -  Uses Zyrtec daily but does not tolerate Flonase, uses Nasonex intermittently  Sure she has a sinus infection now because of post nasal drip. Afebrile. No facial pain. Throat feels raw and swollen to her,but is taking nothing for pain or inflammation. Had ENT care until she lost her job- now followed by [facility]- may be able to have referral- Hx [facility] .   No NVD  Saw PCP early in week and atb deferred. Past Medical History  Diagnosis Date  . Diabetes ([place])   . Hyperlipidemia   . Allergic rhinitis   . Anxiety   . GERD (gastroesophageal reflux disease)   . Depression    Past Surgical History  Procedure Laterality Date  . Tonsillectomy    . Knee surgery     History reviewed. No pertinent family history. Social History  Substance Use Topics  . Smoking status: Never Smoker   . Smokeless tobacco: None  . Alcohol Use: No   OB History    No data available     Review of Systems 10 Systems reviewed and are negative for acute change except as noted in the HPI. Allergies  Compazine and Morphine and related  Now remembers also Levaquin intolerance  Home Medications   Prior to Admission medications   Medication Sig Start Date End Date Taking? Authorizing Provider  amitriptyline (ELAVIL) 75 MG tablet Take 75 mg by mouth at bedtime.   Yes Historical Provider, MD   cetirizine (ZYRTEC ALLERGY) 10 MG tablet Take 10 mg by mouth daily.   Yes Historical Provider, MD  clonazePAM (KLONOPIN) 1 MG tablet Take 1 mg by mouth 3 (three) times daily as needed for anxiety.   Yes Historical Provider, MD  glipiZIDE (GLUCOTROL) 10 MG tablet Take 10 mg by mouth 2 (two) times daily before a meal.   Yes Historical Provider, MD  linagliptin (TRADJENTA) 5 MG TABS tablet Take 5 mg by mouth daily.   Yes Historical Provider, MD  metFORMIN (GLUMETZA) 500 MG (MOD) 24 hr tablet Take 500 mg by mouth 2 (two) times daily with a meal.   Yes Historical Provider, MD  mometasone (NASONEX) 50 MCG/ACT nasal spray Place 2 sprays into the nose daily.   Yes Historical Provider, MD  norethindrone (MICRONOR,CAMILA,ERRIN) 0.35 MG tablet Take 1 tablet by mouth daily.   Yes Historical Provider, MD  OLANZapine (ZYPREXA) 7.5 MG tablet Take 7.5 mg by mouth at bedtime.   Yes Historical Provider, MD  pioglitazone (ACTOS) 30 MG tablet Take 30 mg by mouth daily.   Yes Historical Provider, MD  ranitidine (ZANTAC) 150 MG tablet Take 150 mg by mouth 2 (two) times daily.   Yes Historical Provider, MD  simvastatin (ZOCOR) 20 MG tablet Take 20 mg by mouth daily.   Yes Historical Provider, MD  temazepam (RESTORIL) 15 MG capsule Take 15 mg by mouth at bedtime as needed for sleep.  Yes Historical Provider, MD  topiramate (TOPAMAX) 100 MG tablet Take 100 mg by mouth 2 (two) times daily.   Yes Historical Provider, MD   Meds Ordered and Administered this Visit   Medications  ketorolac (TORADOL) injection 60 mg (60 mg Intramuscular Given 10/08/15 1848)  Feeling significantly  better as prepared to leave  BP 117/76 mmHg  Pulse 92  Temp(Src) 97.9 F (36.6 C) (Oral)  Resp 17  Ht 5\' 5"  (1.651 m)  Wt 201 lb (91.173 kg)  BMI 33.45 kg/m2  SpO2 99%  LMP 09/28/2015 No data found.   Physical Exam    Constitutional -alert and oriented, in some  distress complaining of throat discomfort, Afebrile Head-atraumatic,  normocephalic Eyes- conjunctiva normal, EOMI ,conjugate gaze Ears: grossly normal hearing, canals clear, TMs neg Nose- no congestion or rhinorrhea, no facial tenderness to percussion Mouth/throat- mucous membranes moist ,oropharynx erythematous, tonsils absent, Stat strep negative No exudate or swelling identified,  Neck- supple, shotty anterior nodes; posterior chain is ropey and tender to palpation . FROM chin to chest  CV- regular rate and rhythmn Resp-no distress, normal respiratory effort,clear to auscultation bilaterally GI- soft,non-tender,no distention, no splenic tenderness, normal BS-obese GU- not examined MSK- non tender, normal ROM, ambulatory, no gait instability,on /off table solo Neuro- normal speech and language, no gross focal neurological deficit appreciated,  Skin-warm,dry ,intact;  Psych-mood and affect grossly normal; speech and behavior grossly normal  ED Course  Procedures (including critical care time)  Labs Review Labs Reviewed  CBC WITH DIFFERENTIAL/PLATELET - Abnormal; Notable for the following:    Hemoglobin 11.6 (*)    MCV 77.0 (*)    MCH 24.8 (*)    RDW 15.6 (*)    Neutro Abs 7.0 (*)    All other components within normal limits  MONONUCLEOSIS SCREEN - Abnormal; Notable for the following:    Mono Screen POSITIVE (*)    All other components within normal limits  RAPID STREP SCREEN (NOT AT [facility])  CULTURE, GROUP A STREP [facility])   Results for orders placed or performed during the hospital encounter of 10/08/15  Rapid strep screen  Result Value Ref Range   Streptococcus, Group A Screen (Direct) NEGATIVE NEGATIVE  CBC with Differential  Result Value Ref Range   WBC 10.4 3.6 - 11.0 K/uL   RBC 4.68 3.80 - 5.20 MIL/uL   Hemoglobin 11.6 (L) 12.0 - 16.0 g/dL   HCT 36.0 35.0 - 47.0 %   MCV 77.0 (L) 80.0 - 100.0 fL   MCH 24.8 (L) 26.0 - 34.0 pg   MCHC 32.3 32.0 - 36.0 g/dL   RDW 15.6 (H) 11.5 - 14.5 %   Platelets 379 150 - 440 K/uL   Neutrophils  Relative % 67 %   Neutro Abs 7.0 (H) 1.4 - 6.5 K/uL   Lymphocytes Relative 27 %   Lymphs Abs 2.8 1.0 - 3.6 K/uL   Monocytes Relative 6 %   Monocytes Absolute 0.6 0.2 - 0.9 K/uL   Eosinophils Relative 0 %   Eosinophils Absolute 0.0 0 - 0.7 K/uL   Basophils Relative 0 %   Basophils Absolute 0.0 0 - 0.1 K/uL  Mononucleosis screen  Result Value Ref Range   Mono Screen POSITIVE (A) NEGATIVE   Imaging Review No results found.     MDM   1. Mononucleosis    Plan: Test results and diagnosis reviewed with patient-informational handout given Rx as per orders;  benefits, risks, potential side effects reviewed  Rest -fluids- tylenol/ibuprofen continuous/cyclic -  no contact sports/activities-no alcohol Advised duration of symptoms can at times  be extended...needs to make effort to rest. She feel so much better now with the Toradol on board and is encouraged to keep cyclic tylenol and ibuprofen alternating as a priority the next few days at least.. Ice cream, popsicles, chilled fruit cup. Solids are not stressed, fluids are. Watch for adequate voiding. Seek additional medical care if symptoms worsen or are not improving      Jan Fireman, PA-C 10/10/15 2207

## 2015-10-08 NOTE — Discharge Instructions (Signed)
Infectious Mononucleosis °Infectious mononucleosis is an infection caused by a virus. This illness is often called "mono." It causes symptoms that affect various areas of the body, including the throat, upper air passages, and lymph glands. The liver or spleen may also be affected. °The virus spreads from person to person through close contact. The illness is usually not serious and often goes away in 2-4 weeks without treatment. In rare cases, symptoms can be more severe and last longer, sometimes up to several months. Because the illness can sometimes cause the liver or spleen to become enlarged, you should not participate in contact sports or strenuous exercise until your health care provider approves. °CAUSES  °Infectious mononucleosis is caused by the Epstein-Barr virus. This virus spreads through contact with an infected person's saliva or other bodily fluids. It is often spread through kissing. It may also spread through coughing or sharing utensils or drinking glasses that were recently used by an infected person. An infected person will not always appear ill but can still spread the virus. °RISK FACTORS °This illness is most common in adolescents and young adults. °SIGNS AND SYMPTOMS  °The most common symptoms of infectious mononucleosis are: °· Sore throat.   °· Headache.   °· Fatigue.   °· Muscle aches.   °· Swollen glands.   °· Fever.   °· Poor appetite.   °· Enlarged liver or spleen.   °Some less common symptoms that can also occur include: °· Rash. This is more common if you take antibiotic medicines. °· Feeling sick to your stomach (nauseous).   °· Abdominal pain.   °DIAGNOSIS  °Your health care provider will take your medical history and do a physical exam. Blood tests can be done to confirm the diagnosis.  °TREATMENT  °Infectious mononucleosis usually goes away on its own with time. It cannot be cured with medicines, but medicines are sometimes used to relieve symptoms. Steroid medicine is sometimes  needed if the swelling in the throat causes breathing or swallowing problems. Treatment in a hospital is sometimes needed for severe cases.  °HOME CARE INSTRUCTIONS  °· Rest as needed.   °· Do not participate in contact sports, strenuous exercise, or heavy lifting until your health care provider approves. The liver and spleen could be seriously injured if they are enlarged from the illness. You may need to wait a couple months before participating in sports.   °· Drink enough fluid to keep your urine clear or pale yellow.   °· Do not drink alcohol. °· Take medicines only as directed by your health care provider. Children under 18 years of age should not take aspirin because of the association with Reye syndrome.   °· Eat soft foods. Cold foods such as ice cream or frozen ice pops can soothe a sore throat. °· If you have a sore throat, gargle with a mixture of salt and water. This may help relieve your discomfort. Mix 1 tsp of salt in 1 cup of warm water. Sucking on hard candy may also help.   °· Start regular activities gradually after the fever is gone. Be sure to rest when tired.   °· Avoid kissing or sharing utensils or drinking glasses until your health care provider tells you that you are no longer contagious.   °PREVENTION  °To avoid spreading the virus, do not kiss anyone or share utensils, drinking glasses, or food until your health care provider tells you that you are no longer contagious. °SEEK MEDICAL CARE IF:  °· Your fever is not gone after 10 days. °· You have swollen lymph nodes that are not   back to normal after 4 weeks. °· Your activity level is not back to normal after 2 months.   °· You have yellow coloring to your eyes and skin (jaundice). °· You have constipation.   °SEEK IMMEDIATE MEDICAL CARE IF:  °· You have severe pain in the abdomen or shoulder. °· You are drooling. °· You have trouble swallowing. °· You have trouble breathing. °· You develop a stiff neck. °· You develop a severe  headache. °· You cannot stop throwing up (vomiting). °· You have convulsions. °· You are confused. °· You have trouble with balance. °· You have signs of dehydration. These may include: °¨ Weakness. °¨ Sunken eyes. °¨ Pale skin. °¨ Dry mouth. °¨ Rapid breathing or pulse. °  °This information is not intended to replace advice given to you by your health care provider. Make sure you discuss any questions you have with your health care provider. °  °Document Released: 08/03/2000 Document Revised: 08/27/2014 Document Reviewed: 04/13/2014 °Elsevier Interactive Patient Education ©2016 [employer] ° °

## 2015-10-08 NOTE — ED Notes (Addendum)
Patient complains of a sore throat. Patient has a swollen throat with white pus pockets bilaterally. Patient states that she can barely swallow. Patient reports that symptoms started a week or more ago and saw her Primary MD on Thursday and she told her to call her if not improving.

## 2015-10-09 ENCOUNTER — Encounter: Payer: Self-pay | Admitting: Physician Assistant

## 2015-10-10 LAB — CULTURE, GROUP A STREP (THRC)

## 2016-09-17 ENCOUNTER — Ambulatory Visit: Payer: BLUE CROSS/BLUE SHIELD | Admitting: Physical Therapy

## 2016-09-20 ENCOUNTER — Encounter: Payer: BLUE CROSS/BLUE SHIELD | Admitting: Physical Therapy

## 2016-09-27 ENCOUNTER — Encounter: Payer: BLUE CROSS/BLUE SHIELD | Admitting: Physical Therapy

## 2016-10-01 ENCOUNTER — Encounter: Payer: BLUE CROSS/BLUE SHIELD | Admitting: Physical Therapy

## 2017-01-01 ENCOUNTER — Other Ambulatory Visit: Payer: Self-pay | Admitting: Gastroenterology

## 2017-01-01 DIAGNOSIS — R1031 Right lower quadrant pain: Secondary | ICD-10-CM

## 2017-01-01 DIAGNOSIS — R1032 Left lower quadrant pain: Secondary | ICD-10-CM

## 2017-01-01 DIAGNOSIS — Z87898 Personal history of other specified conditions: Secondary | ICD-10-CM | POA: Insufficient documentation

## 2017-01-10 ENCOUNTER — Ambulatory Visit: Payer: BLUE CROSS/BLUE SHIELD

## 2017-04-19 ENCOUNTER — Ambulatory Visit: Admit: 2017-04-19 | Payer: BLUE CROSS/BLUE SHIELD | Admitting: Gastroenterology

## 2017-04-19 SURGERY — ESOPHAGOGASTRODUODENOSCOPY (EGD) WITH PROPOFOL
Anesthesia: General

## 2019-07-07 ENCOUNTER — Other Ambulatory Visit: Payer: Self-pay | Admitting: Internal Medicine

## 2019-07-07 DIAGNOSIS — N6313 Unspecified lump in the right breast, lower outer quadrant: Secondary | ICD-10-CM

## 2019-07-10 ENCOUNTER — Ambulatory Visit
Admission: RE | Admit: 2019-07-10 | Discharge: 2019-07-10 | Disposition: A | Discharge status: Home / Self Care | Payer: BC Managed Care – PPO | Source: Ambulatory Visit | Attending: Internal Medicine | Admitting: Internal Medicine

## 2019-07-10 DIAGNOSIS — N6313 Unspecified lump in the right breast, lower outer quadrant: Secondary | ICD-10-CM

## 2019-07-14 ENCOUNTER — Other Ambulatory Visit: Payer: Self-pay | Admitting: Internal Medicine

## 2019-07-14 DIAGNOSIS — N6310 Unspecified lump in the right breast, unspecified quadrant: Secondary | ICD-10-CM

## 2019-07-14 DIAGNOSIS — R928 Other abnormal and inconclusive findings on diagnostic imaging of breast: Secondary | ICD-10-CM

## 2019-07-21 ENCOUNTER — Other Ambulatory Visit: Payer: BC Managed Care – PPO

## 2019-07-21 ENCOUNTER — Ambulatory Visit: Payer: BC Managed Care – PPO

## 2019-07-22 ENCOUNTER — Ambulatory Visit
Admission: RE | Admit: 2019-07-22 | Discharge: 2019-07-22 | Disposition: A | Discharge status: Home / Self Care | Payer: BC Managed Care – PPO | Source: Ambulatory Visit | Attending: Internal Medicine | Admitting: Internal Medicine

## 2019-07-22 DIAGNOSIS — R928 Other abnormal and inconclusive findings on diagnostic imaging of breast: Secondary | ICD-10-CM

## 2019-07-22 DIAGNOSIS — N6310 Unspecified lump in the right breast, unspecified quadrant: Secondary | ICD-10-CM

## 2019-07-22 HISTORY — PX: BREAST BIOPSY: SHX20

## 2019-07-24 ENCOUNTER — Other Ambulatory Visit: Payer: Self-pay

## 2019-07-24 ENCOUNTER — Other Ambulatory Visit: Payer: Self-pay | Admitting: Oncology

## 2019-07-24 DIAGNOSIS — C50919 Malignant neoplasm of unspecified site of unspecified female breast: Secondary | ICD-10-CM

## 2019-07-24 NOTE — Progress Notes (Signed)
Phoned patient today to establish navigation.  Patient scheduled for Surgical consult with Dr. Peyton Najjar, and Med/Onc consult with Dr. Mike Gip.  Will deliver Breast Cancer Treatment Handbook/ folder with hospital services to Dr. Deniece Ree office prior to visit.

## 2019-07-28 LAB — SURGICAL PATHOLOGY

## 2019-07-28 NOTE — Progress Notes (Signed)
[facility]  [address] [place], [place] [address] Phone: [phone]  Fax: [phone]   Clinic Day:  07/30/2019  Referring physician: Ellamae Sia, MD  Chief Complaint: Heather [place] is a 43 y.o. female with clinical stage IIB triple negative right breast cancer who is referred in consultation by Dr. Ellamae Bell for assessment and management.   HPI:  The patient felt a knot in her right breast less than 1 month ago while showering.   Bilateral mammogram and right unilateral ultrasound on 07/10/2019 revealed a 1.7 x 1.6 x 1.6 cm mass at the 7 o'clock region of the right breast 7 cm from the nipple.  There was an abnormal 1.4 x 0.7 x 0.9 cm axillary lymph node. There was a second 3 mm axillary lymph node with minimal focal cortical thickening.  An ultrasound-guided core biopsies of the mass and the enlarged right axillary lymph node was recommended. BI-RADS category 5.   Ultrasound-guided needle core biopsy on 07/22/2019 revealed a grade III invasive mammary carcinoma, no special type of the right breast.  Specimen was 10 mm.  Ductal carcinoma in situ (DCIS) was not identified. Right axillary lymph node was positive for metastatic carcinoma.  Tumor was ER - (< 1%), PR - (< 1%), and Her2/neu -.  She was seen for initial consult by Dr. Windell Bell on 07/28/2019. She denied any pain. She had no skin changes. She had no nipple discharge.  The possibility of neoadjuvant chemotherapy was discussed as well as port placement.  Symptomatically, she feels "ok".  She notes an achy feeling in her right axillae every one in awhile.  Since her biopsy, the breast mass is larger (hematoma).  She bruises easily.  She has issues with reflux and constipation.  Her weight fluctuate; she has lost about 30 pounds over the past 8 months or longer. The patient is unsure of the etiology, but notes a decreased appetite on her medications. One new medication is doxepin.  Her mood is "okay".    Menarche was at age 48. She has 2 children (27 year old daughter and 48 year old son).  She was 17 years old at her first birth.  Her last menstrual cycle was 2 months ago. She has been on birth control pills since 43 years old.   She is concerned about hereditary breast cancer.  She has no family history of breast or ovarian cancer.  Her father, 2 paternal uncles, and a paternal aunt had lung cancer.   Past Medical History:  Diagnosis Date   Allergic rhinitis    Anxiety    Depression    Diabetes (HCC)    GERD (gastroesophageal reflux disease)    Hyperlipidemia     Past Surgical History:  Procedure Laterality Date   BREAST BIOPSY Right 07/22/2019   mass bx, path pending, heart marker   BREAST BIOPSY Right 07/22/2019   LN bx, path pending,  butterfly hydromarker   KNEE SURGERY     TONSILLECTOMY      Family History  Problem Relation Age of Onset   Lung cancer Father    Lung cancer Paternal Uncle   Patient is unaware of any type of cancers on her mother's side. Her father, 2 paternal uncles, and a paternal aunt had lung cancer. Heart disease runs in the family on her father's side.    Social History:  reports that she has never smoked. She has never used smokeless tobacco. She reports that  she does not drink alcohol or use drugs. She denies any exposure to radiation or toxins. She has 2 children (24 year old daughter and 58 year old son).  She use to work in the [employer]. She is currently not working. Her husband Heather Bell is working in [place] today. The patient is accompanied by her husband, Heather [place], via phone today.  Allergies:  Allergies  Allergen Reactions   Doxycycline Other (See Comments)    "made all my symptoms worse" Other reaction(s): Other (See Comments)    Fluticasone Other (See Comments)    Other reaction(s): Other (See Comments)    Levofloxacin Other (See Comments)    "made all my symptoms worse"    Compazine  [Prochlorperazine Edisylate]    Morphine And Related     Current Medications: Current Outpatient Medications  Medication Sig Dispense Refill   amitriptyline (ELAVIL) 75 MG tablet Take 75 mg by mouth at bedtime.     cetirizine (ZYRTEC) 10 MG tablet Take by mouth.     doxepin (SINEQUAN) 25 MG capsule TK 1 TO 2 CS PO QHS     lamoTRIgine (LAMICTAL) 200 MG tablet Take by mouth.     linagliptin (TRADJENTA) 5 MG TABS tablet Take 5 mg by mouth daily.     Lurasidone HCl 60 MG TABS Take by mouth.     metFORMIN (GLUMETZA) 500 MG (MOD) 24 hr tablet Take 500 mg by mouth 2 (two) times daily with a meal.     norethindrone (MICRONOR,CAMILA,ERRIN) 0.35 MG tablet Take 1 tablet by mouth daily.     OLANZapine (ZYPREXA) 7.5 MG tablet Take 7.5 mg by mouth at bedtime.     omeprazole (PRILOSEC) 20 MG capsule TK 1 C PO D FOR REFLUX     QUEtiapine (SEROQUEL) 300 MG tablet Take by mouth.     simvastatin (ZOCOR) 20 MG tablet Take 20 mg by mouth daily.     topiramate (TOPAMAX) 100 MG tablet Take 100 mg by mouth 2 (two) times daily.     clonazePAM (KLONOPIN) 1 MG tablet Take 1 mg by mouth 3 (three) times daily as needed for anxiety.     glipiZIDE (GLUCOTROL) 10 MG tablet Take 10 mg by mouth 2 (two) times daily before a meal.     mometasone (NASONEX) 50 MCG/ACT nasal spray Place 2 sprays into the nose daily.     pioglitazone (ACTOS) 30 MG tablet Take 30 mg by mouth daily.     ranitidine (ZANTAC) 150 MG tablet Take 150 mg by mouth 2 (two) times daily.     temazepam (RESTORIL) 15 MG capsule Take 15 mg by mouth at bedtime as needed for sleep.     No current facility-administered medications for this visit.    Review of Systems  Constitutional: Positive for weight loss (30 pounds in 8 months). Negative for chills, diaphoresis, fever and malaise/fatigue.       Feels "ok".  HENT: Negative.  Negative for congestion, ear pain, hearing loss, nosebleeds, sinus pain and sore throat.   Eyes: Negative.   Negative for blurred vision and double vision.  Respiratory: Negative.  Negative for cough, sputum production and shortness of breath.   Cardiovascular: Negative.  Negative for chest pain, palpitations, orthopnea and leg swelling.  Gastrointestinal: Positive for constipation. Negative for abdominal pain, blood in stool, diarrhea, heartburn, melena, nausea and vomiting.       Decreased appetite.  GERD.  Genitourinary: Negative for dysuria, frequency, hematuria and urgency.  Premenopausal.  Musculoskeletal: Positive for myalgias (right axillary region achy). Negative for back pain, joint pain and neck pain.  Skin: Negative.  Negative for itching and rash.  Neurological: Negative.  Negative for dizziness, tingling, sensory change, speech change, focal weakness, weakness and headaches.  Endo/Heme/Allergies: Bruises/bleeds easily (bruising only).       Type 2 diabetes.  Psychiatric/Behavioral: Negative for memory loss. The patient is not nervous/anxious and does not have insomnia.        Bipolar affective disorder. Mood "ok" today.  All other systems reviewed and are negative.  Performance status (ECOG): 0  Vitals Blood pressure 114/66, pulse 96, temperature 98.8 F (37.1 C), temperature source Oral, weight 135 lb 5.8 oz (61.4 kg), SpO2 100 %.   Physical Exam  Constitutional: She is oriented to person, place, and time. She appears well-developed and well-nourished. No distress.  HENT:  Head: Normocephalic and atraumatic.  Mouth/Throat: Oropharynx is clear and moist. No oropharyngeal exudate.  Redd brown hair.  Mask.  Eyes: Pupils are equal, round, and reactive to light. Conjunctivae and EOM are normal. No scleral icterus.  Hazel/green eyes.  Neck: No JVD present.  Cardiovascular: Normal rate, regular rhythm and normal heart sounds.  No murmur heard. Pulmonary/Chest: Effort normal and breath sounds normal. No respiratory distress. She has no wheezes. She has no rales. She exhibits no  tenderness. Right breast exhibits tenderness. Left breast exhibits no skin change (scattered fibrocystic changes).  RIGHT breast mass and hematoma (3-3.5 cm; 7 o'clock) with lateral ecchymosis.  Abdominal: Soft. Bowel sounds are normal. She exhibits no distension. There is no abdominal tenderness. There is no rebound and no guarding.  Musculoskeletal:        General: No tenderness or edema. Normal range of motion.     Cervical back: Normal range of motion and neck supple.  Lymphadenopathy:       Head (right side): No preauricular, no posterior auricular and no occipital adenopathy present.       Head (left side): No preauricular, no posterior auricular and no occipital adenopathy present.    She has no cervical adenopathy.    She has axillary adenopathy (small right axillary node).       Right: No inguinal and no supraclavicular adenopathy present.       Left: No inguinal and no supraclavicular adenopathy present.  Neurological: She is alert and oriented to person, place, and time.  Skin: Skin is warm and dry. No rash noted. She is not diaphoretic. No erythema. No pallor.  Psychiatric: She has a normal mood and affect. Her behavior is normal. Judgment and thought content normal.  Nursing note and vitals reviewed.   No visits with results within 3 Day(s) from this visit.  Latest known visit with results is:  Hospital Outpatient Visit on 07/22/2019  Component Date Value Ref Range Status   SURGICAL PATHOLOGY 07/22/2019    Final-Edited                   Value:SURGICAL PATHOLOGY CASE: [id number] PATIENT: Heather Bell Surgical Pathology Report  Specimen Submitted: A. Breast, right B. Lymph node, right axilla  Clinical History: Heart-shaped clip in mass at 7:00 right breast, HydroMARK clip in right axilla, ultrasound-guided biopsy  DIAGNOSIS: A. RIGHT BREAST; ULTRASOUND-GUIDED NEEDLE CORE BIOPSY: - INVASIVE MAMMARY CARCINOMA, NO SPECIAL TYPE.  Size of invasive carcinoma: 10 mm in  this sample Histologic grade of invasive carcinoma: Grade 3  Glandular/tubular differentiation score: 3                      Nuclear pleomorphism score: 3                      Mitotic rate score: 3                      Total score: 9 Ductal carcinoma in situ: Not identified  ER/PR/HER2: Immunohistochemistry will be performed on block A1, with reflex to [place] for HER2 2+. The results will be reported in an addendum.  Comment: The definitive grade and classification will be assigned on the excisional specime                         n.  Focal areas of possible squamous differentiation are noted; a component of metaplastic carcinoma cannot be excluded. These findings were communicated to Electa Sniff via [facility] secure chat on 07/23/19.  B. LYMPH NODE, RIGHT AXILLA; ULTRASOUND-GUIDED NEEDLE CORE BIOPSY: - POSITIVE FOR METASTATIC CARCINOMA (4MM IN THIS SAMPLE).  GROSS DESCRIPTION: A. Labeled: Ultrasound-guided right breast core biopsy at 7 o'clock position and 7 cm from nipple Received: In formalin Time/date in fixative: Tissue procedure time 1:39 PM, tissue put in formalin time 1:39 PM 07/22/2019 Cold ischemic time: Less than 1 minute Total fixation time: 8 hours Core pieces: 3 Size: 1.4 cm in length and 0.2 cm in diameter each Description: Fibrofatty soft tissue cores Ink color: Blue Entirely submitted in 1 cassette.  B. Labeled: Right axilla lymph node Received: In formalin Time/date in fixative: Tissue procedure time 1:45 PM, tissue put in formalin time 1:45 PM 07/22/2019 Cold ischemic time: L                         ess than 1 minute Total fixation time: 7 hours Core pieces: 2 Size: 1.3 cm in length and 0.1 cm in diameter each Description: Fibrofatty soft tissue cores Ink color: Black Entirely submitted in 1 cassette.  Final Diagnosis performed by Betsy Pries, MD.   Electronically signed 07/23/2019 1:34:15PM The electronic signature indicates that the  named Attending Pathologist has evaluated the specimen Technical component performed at [facility], [address] [place], [place] [address] Lab: [phone] Dir: Rush Farmer, MD, MMM  Professional component performed at [facility], [facility], [place], [place], [place] [address] Lab: [phone] Dir: Dellia Nims. Reuel Derby, MD    Assessment:  Heather Bell is a 43 y.o. female with clinical T1cN1 triple negative right breast cancer s/p ultrasound guided core biopsy.  Pathology revealed a grade III invasive mammary carcinoma, no special type of the right breast.  Specimen was 10 mm.  Ductal carcinoma in situ (DCIS) was not identified. Right axillary lymph node was positive for metastatic carcinoma.  Tumor was ER - (< 1%), PR - (< 1%), and Her2/neu -.  Bilateral mammogram and right unilateral ultrasound on 07/10/2019 revealed a 1.7 x 1.6 x 1.6 cm mass at the 7 o'clock region of the right breast 7 cm from the nipple.  There was an abnormal 1.4 x 0.7 x 0.9 cm axillary lymph node. There was a second 3 mm axillary lymph node with minimal focal cortical thickening.  An ultrasound-guided core biopsies of the mass and the enlarged right axillary lymph node was recommended. BI-RADS category 5.   Symptomatically, she has lost 30 pounds  in the past 8 months.  She denies any bone pain.  Exam reveals a 3-3.5 cm right breast mass with associated hematoma and a small axillary lymph node.  Plan: 1.   Labs today:  CBC with diff, CMP, CA27.29. 2.   Clinical stage IIB triple negative right breast cancer  Discuss neoadjuvant chemotherapy secondary to triple negative disease   Provides prognostic information based on response to treatment.   Affords addition of adjuvant treatment based on potential residual disease.  Discuss chemotherapy regimens:   Dose dense AC x 4 followed by weekly Taxol x 12.   Dose dense AC x 4 and carboplatin + Taxol weekly.    Discuss trials incorporating addition of  carboplatin to Taxol.     Increase pathologic CR by 20% with unclear affect on [place] term outcome.     Discuss carboplatin every 3 weeks vs weekly (AUC 1.5).     Discuss sequencing of chemotherapy (AC versus Taxol +/- carboplatin first).    Review potential side effects in detail of adriamycin, cyclophosphamide, Taxol, and carboplatin.    Discuss obtaining a baseline echo.   Discuss potential use of adjuvant Xeloda in the future if residual disease at time of surgery.  Discuss ineffectiveness of endocrine therapy.  Discuss [employer] genetic testing    Discuss port-a-cath placement with Dr Heather Bell.  Discuss consideration of staging studies (chest, abdomen, and pelvic CT or PET scan).   Unclear significance of weight loss.   Patient in agreement. 3.   Schedule echocardiogram. 4.   Schedule PET scan. 5.   Schedule chemotherapy class. 6.   Preauth AC with Neulasta support and Taxol + carboplatin weekly. [place] duty letter. 8.   RTC for MD assessment, review of testing, and finalization of treatment plan.  I discussed the assessment and treatment plan with the patient.  The patient was provided an opportunity to ask questions and all were answered.  The patient agreed with the plan and demonstrated an understanding of the instructions.  The patient was advised to call back if the symptoms worsen or if the condition fails to improve as anticipated.  I provided 48 minutes of face-to-face time during this this encounter and > 50% was spent counseling as documented under my assessment and plan.    Efrain Clauson C. Mike Gip, MD, PhD    07/30/2019, 11:28 AM  I, Selena Batten, am acting as scribe for [place]. Mike Gip, MD, PhD.  I, Shoshanah Dapper C. Mike Gip, MD, have reviewed the above documentation for accuracy and completeness, and I agree with the above.

## 2019-07-29 ENCOUNTER — Other Ambulatory Visit: Payer: Self-pay

## 2019-07-30 ENCOUNTER — Inpatient Hospital Stay: Payer: BC Managed Care – PPO

## 2019-07-30 ENCOUNTER — Encounter: Payer: Self-pay | Admitting: Hematology and Oncology

## 2019-07-30 ENCOUNTER — Inpatient Hospital Stay: Payer: BC Managed Care – PPO | Attending: Hematology and Oncology | Admitting: Hematology and Oncology

## 2019-07-30 ENCOUNTER — Other Ambulatory Visit: Payer: Self-pay | Admitting: Hematology and Oncology

## 2019-07-30 VITALS — BP 114/66 | HR 96 | Temp 98.8°F | Wt 135.4 lb

## 2019-07-30 DIAGNOSIS — F418 Other specified anxiety disorders: Secondary | ICD-10-CM | POA: Insufficient documentation

## 2019-07-30 DIAGNOSIS — Z171 Estrogen receptor negative status [ER-]: Secondary | ICD-10-CM | POA: Insufficient documentation

## 2019-07-30 DIAGNOSIS — Z7951 Long term (current) use of inhaled steroids: Secondary | ICD-10-CM | POA: Diagnosis not present

## 2019-07-30 DIAGNOSIS — F319 Bipolar disorder, unspecified: Secondary | ICD-10-CM | POA: Diagnosis not present

## 2019-07-30 DIAGNOSIS — E119 Type 2 diabetes mellitus without complications: Secondary | ICD-10-CM | POA: Diagnosis not present

## 2019-07-30 DIAGNOSIS — K219 Gastro-esophageal reflux disease without esophagitis: Secondary | ICD-10-CM | POA: Diagnosis not present

## 2019-07-30 DIAGNOSIS — E785 Hyperlipidemia, unspecified: Secondary | ICD-10-CM | POA: Diagnosis not present

## 2019-07-30 DIAGNOSIS — C50511 Malignant neoplasm of lower-outer quadrant of right female breast: Secondary | ICD-10-CM | POA: Diagnosis present

## 2019-07-30 DIAGNOSIS — R634 Abnormal weight loss: Secondary | ICD-10-CM | POA: Insufficient documentation

## 2019-07-30 DIAGNOSIS — C50919 Malignant neoplasm of unspecified site of unspecified female breast: Secondary | ICD-10-CM | POA: Insufficient documentation

## 2019-07-30 DIAGNOSIS — Z79899 Other long term (current) drug therapy: Secondary | ICD-10-CM | POA: Insufficient documentation

## 2019-07-30 DIAGNOSIS — Z7984 Long term (current) use of oral hypoglycemic drugs: Secondary | ICD-10-CM | POA: Diagnosis not present

## 2019-07-30 DIAGNOSIS — Z801 Family history of malignant neoplasm of trachea, bronchus and lung: Secondary | ICD-10-CM | POA: Insufficient documentation

## 2019-07-30 DIAGNOSIS — C773 Secondary and unspecified malignant neoplasm of axilla and upper limb lymph nodes: Secondary | ICD-10-CM | POA: Insufficient documentation

## 2019-07-30 DIAGNOSIS — K5900 Constipation, unspecified: Secondary | ICD-10-CM | POA: Diagnosis not present

## 2019-07-30 LAB — CBC WITH DIFFERENTIAL/PLATELET
Abs Immature Granulocytes: 0.01 10*3/uL (ref 0.00–0.07)
Basophils Absolute: 0 10*3/uL (ref 0.0–0.1)
Basophils Relative: 1 %
Eosinophils Absolute: 0.3 10*3/uL (ref 0.0–0.5)
Eosinophils Relative: 3 %
HCT: 35 % — ABNORMAL LOW (ref 36.0–46.0)
Hemoglobin: 11.3 g/dL — ABNORMAL LOW (ref 12.0–15.0)
Immature Granulocytes: 0 %
Lymphocytes Relative: 36 %
Lymphs Abs: 3.1 10*3/uL (ref 0.7–4.0)
MCH: 27.3 pg (ref 26.0–34.0)
MCHC: 32.3 g/dL (ref 30.0–36.0)
MCV: 84.5 fL (ref 80.0–100.0)
Monocytes Absolute: 0.6 10*3/uL (ref 0.1–1.0)
Monocytes Relative: 6 %
Neutro Abs: 4.6 10*3/uL (ref 1.7–7.7)
Neutrophils Relative %: 54 %
Platelets: 384 10*3/uL (ref 150–400)
RBC: 4.14 MIL/uL (ref 3.87–5.11)
RDW: 13.8 % (ref 11.5–15.5)
WBC: 8.6 10*3/uL (ref 4.0–10.5)
nRBC: 0 % (ref 0.0–0.2)

## 2019-07-30 LAB — COMPREHENSIVE METABOLIC PANEL
ALT: 9 U/L (ref 0–44)
AST: 12 U/L — ABNORMAL LOW (ref 15–41)
Albumin: 4 g/dL (ref 3.5–5.0)
Alkaline Phosphatase: 42 U/L (ref 38–126)
Anion gap: 4 — ABNORMAL LOW (ref 5–15)
BUN: 12 mg/dL (ref 6–20)
CO2: 25 mmol/L (ref 22–32)
Calcium: 9.1 mg/dL (ref 8.9–10.3)
Chloride: 109 mmol/L (ref 98–111)
Creatinine, Ser: 0.62 mg/dL (ref 0.44–1.00)
GFR calc Af Amer: >60 mL/min (ref >60)
GFR calc non Af Amer: >60 mL/min (ref >60)
Glucose, Bld: 96 mg/dL (ref 70–99)
Potassium: 4.1 mmol/L (ref 3.5–5.1)
Sodium: 138 mmol/L (ref 135–145)
Total Bilirubin: 0.4 mg/dL (ref 0.3–1.2)
Total Protein: 7.6 g/dL (ref 6.5–8.1)

## 2019-07-31 ENCOUNTER — Other Ambulatory Visit: Payer: Self-pay | Admitting: Hematology and Oncology

## 2019-07-31 DIAGNOSIS — R634 Abnormal weight loss: Secondary | ICD-10-CM | POA: Insufficient documentation

## 2019-07-31 LAB — CANCER ANTIGEN 27.29: CA 27.29: 8.5 U/mL (ref 0.0–38.6)

## 2019-07-31 NOTE — Progress Notes (Signed)
ON PATHWAY REGIMEN - Breast  No Change  Continue With Treatment as Ordered.     A cycle is every 21 days (cycles 1-4):     Paclitaxel      Carboplatin    A cycle is every 14 days (cycles 5-8):     Doxorubicin      Cyclophosphamide      Pegfilgrastim-xxxx   **Always confirm dose/schedule in your pharmacy ordering system**  Patient Characteristics: Preoperative or Nonsurgical Candidate (Clinical Staging), Neoadjuvant Therapy followed by Surgery, Invasive Disease, Chemotherapy, HER2 Negative/Unknown/Equivocal, ER Negative/Unknown, Platinum Therapy Indicated Therapeutic Status: Preoperative or Nonsurgical Candidate (Clinical Staging) AJCC M Category: cM0 AJCC Grade: G3 Breast Surgical Plan: Neoadjuvant Therapy followed by Surgery ER Status: Negative (-) AJCC 8 Stage Grouping: IIB HER2 Status: Negative (-) AJCC T Category: cT1c AJCC N Category: cN1 PR Status: Negative (-) Type of Therapy: Platinum Therapy Indicated Intent of Therapy: Curative Intent, Discussed with Patient

## 2019-07-31 NOTE — Progress Notes (Signed)
START ON PATHWAY REGIMEN - Breast     A cycle is every 21 days (cycles 1-4):     Paclitaxel      Carboplatin    A cycle is every 14 days (cycles 5-8):     Doxorubicin      Cyclophosphamide      Pegfilgrastim-xxxx   **Always confirm dose/schedule in your pharmacy ordering system**  Patient Characteristics: Preoperative or Nonsurgical Candidate (Clinical Staging), Neoadjuvant Therapy followed by Surgery, Invasive Disease, Chemotherapy, HER2 Negative/Unknown/Equivocal, ER Negative/Unknown, Platinum Therapy Indicated Therapeutic Status: Preoperative or Nonsurgical Candidate (Clinical Staging) AJCC M Category: cM0 AJCC Grade: G3 Breast Surgical Plan: Neoadjuvant Therapy followed by Surgery ER Status: Negative (-) AJCC 8 Stage Grouping: IIB HER2 Status: Negative (-) AJCC T Category: cT1c AJCC N Category: cN1 PR Status: Negative (-) Type of Therapy: Platinum Therapy Indicated Intent of Therapy: Curative Intent, Discussed with Patient

## 2019-08-04 ENCOUNTER — Ambulatory Visit: Payer: Self-pay | Admitting: General Surgery

## 2019-08-04 ENCOUNTER — Other Ambulatory Visit
Admission: RE | Admit: 2019-08-04 | Discharge: 2019-08-04 | Disposition: A | Discharge status: Home / Self Care | Payer: BC Managed Care – PPO | Source: Ambulatory Visit | Attending: General Surgery | Admitting: General Surgery

## 2019-08-04 ENCOUNTER — Other Ambulatory Visit: Payer: Self-pay

## 2019-08-04 DIAGNOSIS — Z20828 Contact with and (suspected) exposure to other viral communicable diseases: Secondary | ICD-10-CM | POA: Insufficient documentation

## 2019-08-04 DIAGNOSIS — Z01812 Encounter for preprocedural laboratory examination: Secondary | ICD-10-CM | POA: Diagnosis not present

## 2019-08-04 LAB — SARS CORONAVIRUS 2 (TAT 6-24 HRS): SARS Coronavirus 2: NEGATIVE

## 2019-08-04 NOTE — H&P (Signed)
PATIENT PROFILE: Heather Bell is a 43 y.o. female who presents to the Clinic for consultation at the request of Dr. Quay Bell for evaluation of right breast cancer.  PCP:  Heather Bender, MD  HISTORY OF PRESENT ILLNESS: Heather Bell reports feeling a knot in her right breast a month ago.  She notified her primary care physician.  Diagnostic mammogram and ultrasound was ordered.  The diagnostic mammogram and ultrasound showed a mass.  An abnormal node was also identified on the right axilla.  This led to core biopsy of both areas.  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  Patient denies pain.  There is no pain radiation.  There is no alleviating or aggravating factor.  She denies skin changes.  She denies nipple discharge.  Family history of breast cancer, none (unknown history of mother side) Family history of other cancers: Lung cancer from his father side Menarche: 43 years old Last menstrual cycle: 2 months ago OCPs: Since 43 years old Number of pregnancies: 2 Estrogen therapy: None No history of preradiation to the chest No previous breast biopsy  PROBLEM LIST:         Problem List  Date Reviewed: 01/01/2017         Noted   H/O diarrhea 01/01/2017      GENERAL REVIEW OF SYSTEMS:   General ROS: negative for - chills, fatigue, fever, weight gain or weight loss Allergy and Immunology ROS: negative for - hives  Hematological and Lymphatic ROS: negative for - bleeding problems or bruising, negative for palpable nodes Endocrine ROS: negative for - heat or cold intolerance, hair changes Respiratory ROS: negative for - cough, shortness of breath or wheezing Cardiovascular ROS: no chest pain or palpitations GI ROS: negative for nausea, vomiting, abdominal pain, diarrhea, constipation Musculoskeletal ROS: negative for - joint swelling or muscle pain Neurological ROS: negative for - confusion, syncope Dermatological  ROS: negative for pruritus and rash Psychiatric: negative for anxiety, depression, difficulty sleeping and memory loss  MEDICATIONS: Current Medications        Current Outpatient Medications  Medication Sig Dispense Refill  . blood-glucose meter Misc TRUETRACK BLOOD GLUCOSE DEVI    . cetirizine (ZYRTEC) 10 MG tablet CETIRIZINE HCL 10 MG TABS    . clonazePAM (KLONOPIN) 1 MG tablet Take by mouth.    . dextroamphetamine-amphetamine (ADDERALL XR) 15 MG XR capsule TK 1 C PO QAM  0  . doxepin (SINEQUAN) 25 MG capsule TK 1 TO 2 CS PO QHS    . lamoTRIgine (LAMICTAL) 100 MG tablet   3  . lancets TRUEPLUS LANCETS 26G    . metFORMIN (GLUCOPHAGE) 500 MG tablet Take 1,000 mg by mouth.    . mometasone (NASONEX) 50 mcg/actuation nasal spray 2 sprays by Each Nare route daily.    . norethindrone (MICRONOR) 0.35 mg tablet Take by mouth.    Marland Kitchen omeprazole (PRILOSEC) 20 MG DR capsule TK 1 C PO D FOR REFLUX    . peg-electrolyte (NULYTELY) solution Take 4,000 mLs by mouth as directed. 4000 mL 0  . QUEtiapine (SEROQUEL XR) 300 MG 24 hr tablet QUETIAPINE FUMARATE ER 300 MG XR24H-TAB    . ranitidine (ZANTAC) 150 MG tablet Take 150 mg by mouth.    . simvastatin (ZOCOR) 20 MG tablet Take 20 mg by mouth.    . temazepam (RESTORIL) 7.5 MG capsule Take 15 mg by mouth.    . topiramate (TOPAMAX) 100 MG tablet Take 100  mg by mouth.     No current facility-administered medications for this visit.       ALLERGIES: Compazine  [prochlorperazine edisylate], Doxycycline, Fluticasone, Levofloxacin, and Morphine  PAST MEDICAL HISTORY:     Past Medical History:  Diagnosis Date  . Anxiety   . Bipolar affective disorder (CMS-HCC)   . Diabetes mellitus type 2, uncomplicated (CMS-HCC)   . GERD (gastroesophageal reflux disease)     PAST SURGICAL HISTORY:      Past Surgical History:  Procedure Laterality Date  . APPENDECTOMY    . arthroscopy    . TONSILLECTOMY        FAMILY HISTORY:      Family History  Problem Relation Age of Onset  . Heart disease Father   . Lung cancer Father   . Stroke Father   . Lung cancer Maternal Uncle   . Lung cancer Paternal Aunt   . Lung cancer Paternal Uncle   . Heart disease Paternal Uncle   . Heart disease Maternal Grandfather      SOCIAL HISTORY: Social History          Socioeconomic History  . Marital status: Married    Spouse name: Not on file  . Number of children: Not on file  . Years of education: Not on file  . Highest education level: Not on file  Occupational History  . Not on file  Social Needs  . Financial resource strain: Not on file  . Food insecurity    Worry: Not on file    Inability: Not on file  . Transportation needs    Medical: Not on file    Non-medical: Not on file  Tobacco Use  . Smoking status: Never Smoker  . Smokeless tobacco: Never Used  Substance and Sexual Activity  . Alcohol use: No  . Drug use: No  . Sexual activity: Not on file  Other Topics Concern  . Not on file  Social History Narrative  . Not on file      PHYSICAL EXAM:    Vitals:   07/28/19 1331  BP: 112/72  Pulse: 100   Body mass index is 32.95 kg/m. Weight: 89.8 kg (198 lb)   GENERAL: Alert, active, oriented x3  HEENT: Pupils equal reactive to light. Extraocular movements are intact. Sclera clear. Palpebral conjunctiva normal red color.Pharynx clear.  NECK: Supple with no palpable mass and no adenopathy.  LUNGS: Sound clear with no rales rhonchi or wheezes.  HEART: Regular rhythm S1 and S2 without murmur.  BREAST: Palpable mass in the lower outer quadrant of the right breast.  There is no palpable axillary adenopathy.  There is no nipple discharge.  There is no palpable mass, skin changes or abnormalities of the left breast.  ABDOMEN: Soft and depressible, nontender with no palpable mass, no hepatomegaly. Wounds dry and clean.  EXTREMITIES:  Well-developed well-nourished symmetrical with no dependent edema.  NEUROLOGICAL: Awake alert oriented, facial expression symmetrical, moving all extremities.  REVIEW OF DATA: I have reviewed the following data today: No visits with results within 3 Month(s) from this visit.  Latest known visit with results is:  No results found for any previous visit.  I personally evaluated the pathology results of the right breast core needle biopsy.  I discussed in detail the results with the patient.  ASSESSMENT: Ms. Meola is a 43 y.o. female presenting for consultation for right breast cancer.    Breast cancer metastasized to axillary lymph node, right (CMS-HCC) [C50.911, C77.3]  I  discussed with the patient the diagnosis of right breast cancer.  Patient with 10 to 16 mm right breast mass.  Biopsy showed invasive mammary carcinoma.  One lymph node in the right axilla abnormal on ultrasound shows metastasis from invasive mammary carcinoma.  I discussed in details with the patient about this finding.  Patient was evaluated by Oncology and Chemotherapy was recommended. Patient oriented about the port a cath.   PLAN: 1. Insertion of Port a cath [phone], [id number], [id number])   Patient verbalized understanding, all questions were answered, and were agreeable with the plan outlined above.     Herbert Pun, MD  Electronically signed by Herbert Pun, MD

## 2019-08-04 NOTE — H&P (View-Only) (Signed)
PATIENT PROFILE: JAYLEY MIDGETTE is a 43 y.o. female who presents to the Clinic for consultation at the request of Dr. Quay Burow for evaluation of right breast cancer.  PCP:  Lorayne Bender, MD  HISTORY OF PRESENT ILLNESS: Ms. Bouska reports feeling a knot in her right breast a month ago.  She notified her primary care physician.  Diagnostic mammogram and ultrasound was ordered.  The diagnostic mammogram and ultrasound showed a mass.  An abnormal node was also identified on the right axilla.  This led to core biopsy of both areas.  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  Patient denies pain.  There is no pain radiation.  There is no alleviating or aggravating factor.  She denies skin changes.  She denies nipple discharge.  Family history of breast cancer, none (unknown history of mother side) Family history of other cancers: Lung cancer from his father side Menarche: 43 years old Last menstrual cycle: 2 months ago OCPs: Since 43 years old Number of pregnancies: 2 Estrogen therapy: None No history of preradiation to the chest No previous breast biopsy  PROBLEM LIST:         Problem List  Date Reviewed: 01/01/2017         Noted   H/O diarrhea 01/01/2017      GENERAL REVIEW OF SYSTEMS:   General ROS: negative for - chills, fatigue, fever, weight gain or weight loss Allergy and Immunology ROS: negative for - hives  Hematological and Lymphatic ROS: negative for - bleeding problems or bruising, negative for palpable nodes Endocrine ROS: negative for - heat or cold intolerance, hair changes Respiratory ROS: negative for - cough, shortness of breath or wheezing Cardiovascular ROS: no chest pain or palpitations GI ROS: negative for nausea, vomiting, abdominal pain, diarrhea, constipation Musculoskeletal ROS: negative for - joint swelling or muscle pain Neurological ROS: negative for - confusion, syncope Dermatological  ROS: negative for pruritus and rash Psychiatric: negative for anxiety, depression, difficulty sleeping and memory loss  MEDICATIONS: Current Medications        Current Outpatient Medications  Medication Sig Dispense Refill  . blood-glucose meter Misc TRUETRACK BLOOD GLUCOSE DEVI    . cetirizine (ZYRTEC) 10 MG tablet CETIRIZINE HCL 10 MG TABS    . clonazePAM (KLONOPIN) 1 MG tablet Take by mouth.    . dextroamphetamine-amphetamine (ADDERALL XR) 15 MG XR capsule TK 1 C PO QAM  0  . doxepin (SINEQUAN) 25 MG capsule TK 1 TO 2 CS PO QHS    . lamoTRIgine (LAMICTAL) 100 MG tablet   3  . lancets TRUEPLUS LANCETS 26G    . metFORMIN (GLUCOPHAGE) 500 MG tablet Take 1,000 mg by mouth.    . mometasone (NASONEX) 50 mcg/actuation nasal spray 2 sprays by Each Nare route daily.    . norethindrone (MICRONOR) 0.35 mg tablet Take by mouth.    Marland Kitchen omeprazole (PRILOSEC) 20 MG DR capsule TK 1 C PO D FOR REFLUX    . peg-electrolyte (NULYTELY) solution Take 4,000 mLs by mouth as directed. 4000 mL 0  . QUEtiapine (SEROQUEL XR) 300 MG 24 hr tablet QUETIAPINE FUMARATE ER 300 MG XR24H-TAB    . ranitidine (ZANTAC) 150 MG tablet Take 150 mg by mouth.    . simvastatin (ZOCOR) 20 MG tablet Take 20 mg by mouth.    . temazepam (RESTORIL) 7.5 MG capsule Take 15 mg by mouth.    . topiramate (TOPAMAX) 100 MG tablet Take 100  mg by mouth.     No current facility-administered medications for this visit.       ALLERGIES: Compazine  [prochlorperazine edisylate], Doxycycline, Fluticasone, Levofloxacin, and Morphine  PAST MEDICAL HISTORY:     Past Medical History:  Diagnosis Date  . Anxiety   . Bipolar affective disorder (CMS-HCC)   . Diabetes mellitus type 2, uncomplicated (CMS-HCC)   . GERD (gastroesophageal reflux disease)     PAST SURGICAL HISTORY:      Past Surgical History:  Procedure Laterality Date  . APPENDECTOMY    . arthroscopy    . TONSILLECTOMY        FAMILY HISTORY:      Family History  Problem Relation Age of Onset  . Heart disease Father   . Lung cancer Father   . Stroke Father   . Lung cancer Maternal Uncle   . Lung cancer Paternal Aunt   . Lung cancer Paternal Uncle   . Heart disease Paternal Uncle   . Heart disease Maternal Grandfather      SOCIAL HISTORY: Social History          Socioeconomic History  . Marital status: Married    Spouse name: Not on file  . Number of children: Not on file  . Years of education: Not on file  . Highest education level: Not on file  Occupational History  . Not on file  Social Needs  . Financial resource strain: Not on file  . Food insecurity    Worry: Not on file    Inability: Not on file  . Transportation needs    Medical: Not on file    Non-medical: Not on file  Tobacco Use  . Smoking status: Never Smoker  . Smokeless tobacco: Never Used  Substance and Sexual Activity  . Alcohol use: No  . Drug use: No  . Sexual activity: Not on file  Other Topics Concern  . Not on file  Social History Narrative  . Not on file      PHYSICAL EXAM:    Vitals:   07/28/19 1331  BP: 112/72  Pulse: 100   Body mass index is 32.95 kg/m. Weight: 89.8 kg (198 lb)   GENERAL: Alert, active, oriented x3  HEENT: Pupils equal reactive to light. Extraocular movements are intact. Sclera clear. Palpebral conjunctiva normal red color.Pharynx clear.  NECK: Supple with no palpable mass and no adenopathy.  LUNGS: Sound clear with no rales rhonchi or wheezes.  HEART: Regular rhythm S1 and S2 without murmur.  BREAST: Palpable mass in the lower outer quadrant of the right breast.  There is no palpable axillary adenopathy.  There is no nipple discharge.  There is no palpable mass, skin changes or abnormalities of the left breast.  ABDOMEN: Soft and depressible, nontender with no palpable mass, no hepatomegaly. Wounds dry and clean.  EXTREMITIES:  Well-developed well-nourished symmetrical with no dependent edema.  NEUROLOGICAL: Awake alert oriented, facial expression symmetrical, moving all extremities.  REVIEW OF DATA: I have reviewed the following data today: No visits with results within 3 Month(s) from this visit.  Latest known visit with results is:  No results found for any previous visit.  I personally evaluated the pathology results of the right breast core needle biopsy.  I discussed in detail the results with the patient.  ASSESSMENT: Ms. Coffie is a 43 y.o. female presenting for consultation for right breast cancer.    Breast cancer metastasized to axillary lymph node, right (CMS-HCC) [C50.911, C77.3]  I  discussed with the patient the diagnosis of right breast cancer.  Patient with 10 to 16 mm right breast mass.  Biopsy showed invasive mammary carcinoma.  One lymph node in the right axilla abnormal on ultrasound shows metastasis from invasive mammary carcinoma.  I discussed in details with the patient about this finding.  Patient was evaluated by Oncology and Chemotherapy was recommended. Patient oriented about the port a cath.   PLAN: 1. Insertion of Port a cath [phone], [id number], [id number])   Patient verbalized understanding, all questions were answered, and were agreeable with the plan outlined above.     Herbert Pun, MD  Electronically signed by Herbert Pun, MD

## 2019-08-04 NOTE — Patient Instructions (Signed)
Doxorubicin injection What is this medicine? DOXORUBICIN (dox oh ROO bi sin) is a chemotherapy drug. It is used to treat many kinds of cancer like leukemia, lymphoma, neuroblastoma, sarcoma, and Wilms' tumor. It is also used to treat bladder cancer, breast cancer, lung cancer, ovarian cancer, stomach cancer, and thyroid cancer. This medicine may be used for other purposes; ask your health care provider or pharmacist if you have questions. COMMON BRAND NAME(S): Adriamycin, Adriamycin PFS, Adriamycin RDF, Rubex What should I tell my health care provider before I take this medicine? They need to know if you have any of these conditions:  heart disease  history of low blood counts caused by a medicine  liver disease  recent or ongoing radiation therapy  an unusual or allergic reaction to doxorubicin, other chemotherapy agents, other medicines, foods, dyes, or preservatives  pregnant or trying to get pregnant  breast-feeding How should I use this medicine? This drug is given as an infusion into a vein. It is administered in a hospital or clinic by a specially trained health care professional. If you have pain, swelling, burning or any unusual feeling around the site of your injection, tell your health care professional right away. Talk to your pediatrician regarding the use of this medicine in children. Special care may be needed. Overdosage: If you think you have taken too much of this medicine contact a poison control center or emergency room at once. NOTE: This medicine is only for you. Do not share this medicine with others. What if I miss a dose? It is important not to miss your dose. Call your doctor or health care professional if you are unable to keep an appointment. What may interact with this medicine? This medicine may interact with the following medications:  6-mercaptopurine  paclitaxel  phenytoin  St. John's Wort  trastuzumab  verapamil This list may not describe  all possible interactions. Give your health care provider a list of all the medicines, herbs, non-prescription drugs, or dietary supplements you use. Also tell them if you smoke, drink alcohol, or use illegal drugs. Some items may interact with your medicine. What should I watch for while using this medicine? This drug may make you feel generally unwell. This is not uncommon, as chemotherapy can affect healthy cells as well as cancer cells. Report any side effects. Continue your course of treatment even though you feel ill unless your doctor tells you to stop. There is a maximum amount of this medicine you should receive throughout your life. The amount depends on the medical condition being treated and your overall health. Your doctor will watch how much of this medicine you receive in your lifetime. Tell your doctor if you have taken this medicine before. You may need blood work done while you are taking this medicine. Your urine may turn red for a few days after your dose. This is not blood. If your urine is dark or brown, call your doctor. In some cases, you may be given additional medicines to help with side effects. Follow all directions for their use. Call your doctor or health care professional for advice if you get a fever, chills or sore throat, or other symptoms of a cold or flu. Do not treat yourself. This drug decreases your body's ability to fight infections. Try to avoid being around people who are sick. This medicine may increase your risk to bruise or bleed. Call your doctor or health care professional if you notice any unusual bleeding. Talk to your doctor   about your risk of cancer. You may be more at risk for certain types of cancers if you take this medicine. Do not become pregnant while taking this medicine or for 6 months after stopping it. Women should inform their doctor if they wish to become pregnant or think they might be pregnant. Men should not father a child while taking this  medicine and for 6 months after stopping it. There is a potential for serious side effects to an unborn child. Talk to your health care professional or pharmacist for more information. Do not breast-feed an infant while taking this medicine. This medicine has caused ovarian failure in some women and reduced sperm counts in some men This medicine may interfere with the ability to have a child. Talk with your doctor or health care professional if you are concerned about your fertility. This medicine may cause a decrease in Co-Enzyme Q-10. You should make sure that you get enough Co-Enzyme Q-10 while you are taking this medicine. Discuss the foods you eat and the vitamins you take with your health care professional. What side effects may I notice from receiving this medicine? Side effects that you should report to your doctor or health care professional as soon as possible:  allergic reactions like skin rash, itching or hives, swelling of the face, lips, or tongue  breathing problems  chest pain  fast or irregular heartbeat  low blood counts - this medicine may decrease the number of white blood cells, red blood cells and platelets. You may be at increased risk for infections and bleeding.  pain, redness, or irritation at site where injected  signs of infection - fever or chills, cough, sore throat, pain or difficulty passing urine  signs of decreased platelets or bleeding - bruising, pinpoint red spots on the skin, black, tarry stools, blood in the urine  swelling of the ankles, feet, hands  tiredness  weakness Side effects that usually do not require medical attention (report to your doctor or health care professional if they continue or are bothersome):  diarrhea  hair loss  mouth sores  nail discoloration or damage  nausea  red colored urine  vomiting This list may not describe all possible side effects. Call your doctor for medical advice about side effects. You may report  side effects to FDA at 1-800-FDA-1088. Where should I keep my medicine? This drug is given in a hospital or clinic and will not be stored at home. NOTE: This sheet is a summary. It may not cover all possible information. If you have questions about this medicine, talk to your doctor, pharmacist, or health care provider.  2020 [employer]/Gold Standard (2017-03-20 11:01:26) Cyclophosphamide injection What is this medicine? CYCLOPHOSPHAMIDE (sye kloe FOSS fa mide) is a chemotherapy drug. It slows the growth of cancer cells. This medicine is used to treat many types of cancer like lymphoma, myeloma, leukemia, breast cancer, and ovarian cancer, to name a few. This medicine may be used for other purposes; ask your health care provider or pharmacist if you have questions. COMMON BRAND NAME(S): Cytoxan, Neosar What should I tell my health care provider before I take this medicine? They need to know if you have any of these conditions:  blood disorders  history of other chemotherapy  infection  kidney disease  liver disease  recent or ongoing radiation therapy  tumors in the bone marrow  an unusual or allergic reaction to cyclophosphamide, other chemotherapy, other medicines, foods, dyes, or preservatives  pregnant or trying to get  pregnant  breast-feeding How should I use this medicine? This drug is usually given as an injection into a vein or muscle or by infusion into a vein. It is administered in a hospital or clinic by a specially trained health care professional. Talk to your pediatrician regarding the use of this medicine in children. Special care may be needed. Overdosage: If you think you have taken too much of this medicine contact a poison control center or emergency room at once. NOTE: This medicine is only for you. Do not share this medicine with others. What if I miss a dose? It is important not to miss your dose. Call your doctor or health care professional if you are  unable to keep an appointment. What may interact with this medicine? This medicine may interact with the following medications:  amiodarone  amphotericin B  azathioprine  certain antiviral medicines for HIV or AIDS such as protease inhibitors (e.g., indinavir, ritonavir) and zidovudine  certain blood pressure medications such as benazepril, captopril, enalapril, fosinopril, lisinopril, moexipril, monopril, perindopril, quinapril, ramipril, trandolapril  certain cancer medications such as anthracyclines (e.g., daunorubicin, doxorubicin), busulfan, cytarabine, paclitaxel, pentostatin, tamoxifen, trastuzumab  certain diuretics such as chlorothiazide, chlorthalidone, hydrochlorothiazide, indapamide, metolazone  certain medicines that treat or prevent blood clots like warfarin  certain muscle relaxants such as succinylcholine  cyclosporine  etanercept  indomethacin  medicines to increase blood counts like filgrastim, pegfilgrastim, sargramostim  medicines used as general anesthesia  metronidazole  natalizumab This list may not describe all possible interactions. Give your health care provider a list of all the medicines, herbs, non-prescription drugs, or dietary supplements you use. Also tell them if you smoke, drink alcohol, or use illegal drugs. Some items may interact with your medicine. What should I watch for while using this medicine? Visit your doctor for checks on your progress. This drug may make you feel generally unwell. This is not uncommon, as chemotherapy can affect healthy cells as well as cancer cells. Report any side effects. Continue your course of treatment even though you feel ill unless your doctor tells you to stop. Drink water or other fluids as directed. Urinate often, even at night. In some cases, you may be given additional medicines to help with side effects. Follow all directions for their use. Call your doctor or health care professional for advice if  you get a fever, chills or sore throat, or other symptoms of a cold or flu. Do not treat yourself. This drug decreases your body's ability to fight infections. Try to avoid being around people who are sick. This medicine may increase your risk to bruise or bleed. Call your doctor or health care professional if you notice any unusual bleeding. Be careful brushing and flossing your teeth or using a toothpick because you may get an infection or bleed more easily. If you have any dental work done, tell your dentist you are receiving this medicine. You may get drowsy or dizzy. Do not drive, use machinery, or do anything that needs mental alertness until you know how this medicine affects you. Do not become pregnant while taking this medicine or for 1 year after stopping it. Women should inform their doctor if they wish to become pregnant or think they might be pregnant. Men should not father a child while taking this medicine and for 4 months after stopping it. There is a potential for serious side effects to an unborn child. Talk to your health care professional or pharmacist for more information. Do not breast-feed  an infant while taking this medicine. This medicine may interfere with the ability to have a child. This medicine has caused ovarian failure in some women. This medicine has caused reduced sperm counts in some men. You should talk with your doctor or health care professional if you are concerned about your fertility. If you are going to have surgery, tell your doctor or health care professional that you have taken this medicine. What side effects may I notice from receiving this medicine? Side effects that you should report to your doctor or health care professional as soon as possible:  allergic reactions like skin rash, itching or hives, swelling of the face, lips, or tongue  low blood counts - this medicine may decrease the number of white blood cells, red blood cells and platelets. You may be  at increased risk for infections and bleeding.  signs of infection - fever or chills, cough, sore throat, pain or difficulty passing urine  signs of decreased platelets or bleeding - bruising, pinpoint red spots on the skin, black, tarry stools, blood in the urine  signs of decreased red blood cells - unusually weak or tired, fainting spells, lightheadedness  breathing problems  dark urine  dizziness  palpitations  swelling of the ankles, feet, hands  trouble passing urine or change in the amount of urine  weight gain  yellowing of the eyes or skin Side effects that usually do not require medical attention (report to your doctor or health care professional if they continue or are bothersome):  changes in nail or skin color  hair loss  missed menstrual periods  mouth sores  nausea, vomiting This list may not describe all possible side effects. Call your doctor for medical advice about side effects. You may report side effects to FDA at 1-800-FDA-1088. Where should I keep my medicine? This drug is given in a hospital or clinic and will not be stored at home. NOTE: This sheet is a summary. It may not cover all possible information. If you have questions about this medicine, talk to your doctor, pharmacist, or health care provider.  2020 Elsevier/Gold Standard (2012-06-20 16:22:58) Paclitaxel injection What is this medicine? PACLITAXEL (PAK li TAX el) is a chemotherapy drug. It targets fast dividing cells, like cancer cells, and causes these cells to die. This medicine is used to treat ovarian cancer, breast cancer, lung cancer, Kaposi's sarcoma, and other cancers. This medicine may be used for other purposes; ask your health care provider or pharmacist if you have questions. COMMON BRAND NAME(S): Onxol, Taxol What should I tell my health care provider before I take this medicine? They need to know if you have any of these conditions:  history of irregular  heartbeat  liver disease  low blood counts, like low white cell, platelet, or red cell counts  lung or breathing disease, like asthma  tingling of the fingers or toes, or other nerve disorder  an unusual or allergic reaction to paclitaxel, alcohol, polyoxyethylated castor oil, other chemotherapy, other medicines, foods, dyes, or preservatives  pregnant or trying to get pregnant  breast-feeding How should I use this medicine? This drug is given as an infusion into a vein. It is administered in a hospital or clinic by a specially trained health care professional. Talk to your pediatrician regarding the use of this medicine in children. Special care may be needed. Overdosage: If you think you have taken too much of this medicine contact a poison control center or emergency room at once. NOTE: This  medicine is only for you. Do not share this medicine with others. What if I miss a dose? It is important not to miss your dose. Call your doctor or health care professional if you are unable to keep an appointment. What may interact with this medicine? Do not take this medicine with any of the following medications:  disulfiram  metronidazole This medicine may also interact with the following medications:  antiviral medicines for hepatitis, HIV or AIDS  certain antibiotics like erythromycin and clarithromycin  certain medicines for fungal infections like ketoconazole and itraconazole  certain medicines for seizures like carbamazepine, phenobarbital, phenytoin  gemfibrozil  nefazodone  rifampin  St. John's wort This list may not describe all possible interactions. Give your health care provider a list of all the medicines, herbs, non-prescription drugs, or dietary supplements you use. Also tell them if you smoke, drink alcohol, or use illegal drugs. Some items may interact with your medicine. What should I watch for while using this medicine? Your condition will be monitored  carefully while you are receiving this medicine. You will need important blood work done while you are taking this medicine. This medicine can cause serious allergic reactions. To reduce your risk you will need to take other medicine(s) before treatment with this medicine. If you experience allergic reactions like skin rash, itching or hives, swelling of the face, lips, or tongue, tell your doctor or health care professional right away. In some cases, you may be given additional medicines to help with side effects. Follow all directions for their use. This drug may make you feel generally unwell. This is not uncommon, as chemotherapy can affect healthy cells as well as cancer cells. Report any side effects. Continue your course of treatment even though you feel ill unless your doctor tells you to stop. Call your doctor or health care professional for advice if you get a fever, chills or sore throat, or other symptoms of a cold or flu. Do not treat yourself. This drug decreases your body's ability to fight infections. Try to avoid being around people who are sick. This medicine may increase your risk to bruise or bleed. Call your doctor or health care professional if you notice any unusual bleeding. Be careful brushing and flossing your teeth or using a toothpick because you may get an infection or bleed more easily. If you have any dental work done, tell your dentist you are receiving this medicine. Avoid taking products that contain aspirin, acetaminophen, ibuprofen, naproxen, or ketoprofen unless instructed by your doctor. These medicines may hide a fever. Do not become pregnant while taking this medicine. Women should inform their doctor if they wish to become pregnant or think they might be pregnant. There is a potential for serious side effects to an unborn child. Talk to your health care professional or pharmacist for more information. Do not breast-feed an infant while taking this medicine. Men are  advised not to father a child while receiving this medicine. This product may contain alcohol. Ask your pharmacist or healthcare provider if this medicine contains alcohol. Be sure to tell all healthcare providers you are taking this medicine. Certain medicines, like metronidazole and disulfiram, can cause an unpleasant reaction when taken with alcohol. The reaction includes flushing, headache, nausea, vomiting, sweating, and increased thirst. The reaction can last from 30 minutes to several hours. What side effects may I notice from receiving this medicine? Side effects that you should report to your doctor or health care professional as soon as possible:  allergic reactions like skin rash, itching or hives, swelling of the face, lips, or tongue  breathing problems  changes in vision  fast, irregular heartbeat  high or low blood pressure  mouth sores  pain, tingling, numbness in the hands or feet  signs of decreased platelets or bleeding - bruising, pinpoint red spots on the skin, black, tarry stools, blood in the urine  signs of decreased red blood cells - unusually weak or tired, feeling faint or lightheaded, falls  signs of infection - fever or chills, cough, sore throat, pain or difficulty passing urine  signs and symptoms of liver injury like dark yellow or brown urine; general ill feeling or flu-like symptoms; light-colored stools; loss of appetite; nausea; right upper belly pain; unusually weak or tired; yellowing of the eyes or skin  swelling of the ankles, feet, hands  unusually slow heartbeat Side effects that usually do not require medical attention (report to your doctor or health care professional if they continue or are bothersome):  diarrhea  hair loss  loss of appetite  muscle or joint pain  nausea, vomiting  pain, redness, or irritation at site where injected  tiredness This list may not describe all possible side effects. Call your doctor for medical  advice about side effects. You may report side effects to FDA at 1-800-FDA-1088. Where should I keep my medicine? This drug is given in a hospital or clinic and will not be stored at home. NOTE: This sheet is a summary. It may not cover all possible information. If you have questions about this medicine, talk to your doctor, pharmacist, or health care provider.  2020 [employer]/Gold Standard (2017-04-09 13:14:55) Carboplatin injection What is this medicine? CARBOPLATIN (KAR boe pla tin) is a chemotherapy drug. It targets fast dividing cells, like cancer cells, and causes these cells to die. This medicine is used to treat ovarian cancer and many other cancers. This medicine may be used for other purposes; ask your health care provider or pharmacist if you have questions. COMMON BRAND NAME(S): Paraplatin What should I tell my health care provider before I take this medicine? They need to know if you have any of these conditions:  blood disorders  hearing problems  kidney disease  recent or ongoing radiation therapy  an unusual or allergic reaction to carboplatin, cisplatin, other chemotherapy, other medicines, foods, dyes, or preservatives  pregnant or trying to get pregnant  breast-feeding How should I use this medicine? This drug is usually given as an infusion into a vein. It is administered in a hospital or clinic by a specially trained health care professional. Talk to your pediatrician regarding the use of this medicine in children. Special care may be needed. Overdosage: If you think you have taken too much of this medicine contact a poison control center or emergency room at once. NOTE: This medicine is only for you. Do not share this medicine with others. What if I miss a dose? It is important not to miss a dose. Call your doctor or health care professional if you are unable to keep an appointment. What may interact with this medicine?  medicines for seizures  medicines to  increase blood counts like filgrastim, pegfilgrastim, sargramostim  some antibiotics like amikacin, gentamicin, neomycin, streptomycin, tobramycin  vaccines Talk to your doctor or health care professional before taking any of these medicines:  acetaminophen  aspirin  ibuprofen  ketoprofen  naproxen This list may not describe all possible interactions. Give your health care provider a list of  all the medicines, herbs, non-prescription drugs, or dietary supplements you use. Also tell them if you smoke, drink alcohol, or use illegal drugs. Some items may interact with your medicine. What should I watch for while using this medicine? Your condition will be monitored carefully while you are receiving this medicine. You will need important blood work done while you are taking this medicine. This drug may make you feel generally unwell. This is not uncommon, as chemotherapy can affect healthy cells as well as cancer cells. Report any side effects. Continue your course of treatment even though you feel ill unless your doctor tells you to stop. In some cases, you may be given additional medicines to help with side effects. Follow all directions for their use. Call your doctor or health care professional for advice if you get a fever, chills or sore throat, or other symptoms of a cold or flu. Do not treat yourself. This drug decreases your body's ability to fight infections. Try to avoid being around people who are sick. This medicine may increase your risk to bruise or bleed. Call your doctor or health care professional if you notice any unusual bleeding. Be careful brushing and flossing your teeth or using a toothpick because you may get an infection or bleed more easily. If you have any dental work done, tell your dentist you are receiving this medicine. Avoid taking products that contain aspirin, acetaminophen, ibuprofen, naproxen, or ketoprofen unless instructed by your doctor. These medicines may  hide a fever. Do not become pregnant while taking this medicine. Women should inform their doctor if they wish to become pregnant or think they might be pregnant. There is a potential for serious side effects to an unborn child. Talk to your health care professional or pharmacist for more information. Do not breast-feed an infant while taking this medicine. What side effects may I notice from receiving this medicine? Side effects that you should report to your doctor or health care professional as soon as possible:  allergic reactions like skin rash, itching or hives, swelling of the face, lips, or tongue  signs of infection - fever or chills, cough, sore throat, pain or difficulty passing urine  signs of decreased platelets or bleeding - bruising, pinpoint red spots on the skin, black, tarry stools, nosebleeds  signs of decreased red blood cells - unusually weak or tired, fainting spells, lightheadedness  breathing problems  changes in hearing  changes in vision  chest pain  high blood pressure  low blood counts - This drug may decrease the number of white blood cells, red blood cells and platelets. You may be at increased risk for infections and bleeding.  nausea and vomiting  pain, swelling, redness or irritation at the injection site  pain, tingling, numbness in the hands or feet  problems with balance, talking, walking  trouble passing urine or change in the amount of urine Side effects that usually do not require medical attention (report to your doctor or health care professional if they continue or are bothersome):  hair loss  loss of appetite  metallic taste in the mouth or changes in taste This list may not describe all possible side effects. Call your doctor for medical advice about side effects. You may report side effects to FDA at 1-800-FDA-1088. Where should I keep my medicine? This drug is given in a hospital or clinic and will not be stored at home. NOTE:  This sheet is a summary. It may not cover all possible information. If  you have questions about this medicine, talk to your doctor, pharmacist, or health care provider.  2020 Elsevier/Gold Standard (2007-11-11 14:38:05)

## 2019-08-05 ENCOUNTER — Other Ambulatory Visit: Payer: Self-pay

## 2019-08-05 ENCOUNTER — Inpatient Hospital Stay: Payer: BC Managed Care – PPO

## 2019-08-05 ENCOUNTER — Inpatient Hospital Stay: Payer: BC Managed Care – PPO | Admitting: Hospice and Palliative Medicine

## 2019-08-06 ENCOUNTER — Inpatient Hospital Stay (HOSPITAL_BASED_OUTPATIENT_CLINIC_OR_DEPARTMENT_OTHER): Payer: BC Managed Care – PPO | Admitting: Hospice and Palliative Medicine

## 2019-08-06 DIAGNOSIS — C50919 Malignant neoplasm of unspecified site of unspecified female breast: Secondary | ICD-10-CM

## 2019-08-07 ENCOUNTER — Encounter
Admission: RE | Disposition: A | Discharge status: Home / Self Care | Payer: Self-pay | Source: Home / Self Care | Attending: General Surgery

## 2019-08-07 ENCOUNTER — Ambulatory Visit: Payer: BC Managed Care – PPO

## 2019-08-07 ENCOUNTER — Ambulatory Visit: Payer: BC Managed Care – PPO | Admitting: Registered Nurse

## 2019-08-07 ENCOUNTER — Encounter: Payer: Self-pay | Admitting: General Surgery

## 2019-08-07 ENCOUNTER — Ambulatory Visit
Admission: RE | Admit: 2019-08-07 | Discharge: 2019-08-07 | Disposition: A | Discharge status: Home / Self Care | Payer: BC Managed Care – PPO | Attending: General Surgery | Admitting: General Surgery

## 2019-08-07 DIAGNOSIS — E119 Type 2 diabetes mellitus without complications: Secondary | ICD-10-CM | POA: Insufficient documentation

## 2019-08-07 DIAGNOSIS — C50911 Malignant neoplasm of unspecified site of right female breast: Secondary | ICD-10-CM | POA: Diagnosis not present

## 2019-08-07 DIAGNOSIS — C773 Secondary and unspecified malignant neoplasm of axilla and upper limb lymph nodes: Secondary | ICD-10-CM | POA: Insufficient documentation

## 2019-08-07 DIAGNOSIS — F419 Anxiety disorder, unspecified: Secondary | ICD-10-CM | POA: Insufficient documentation

## 2019-08-07 DIAGNOSIS — K219 Gastro-esophageal reflux disease without esophagitis: Secondary | ICD-10-CM | POA: Insufficient documentation

## 2019-08-07 DIAGNOSIS — Z7984 Long term (current) use of oral hypoglycemic drugs: Secondary | ICD-10-CM | POA: Diagnosis not present

## 2019-08-07 DIAGNOSIS — Z79899 Other long term (current) drug therapy: Secondary | ICD-10-CM | POA: Insufficient documentation

## 2019-08-07 DIAGNOSIS — F319 Bipolar disorder, unspecified: Secondary | ICD-10-CM | POA: Diagnosis not present

## 2019-08-07 DIAGNOSIS — Z95828 Presence of other vascular implants and grafts: Secondary | ICD-10-CM

## 2019-08-07 HISTORY — PX: PORTACATH PLACEMENT: SHX2246

## 2019-08-07 HISTORY — DX: Bipolar disorder, unspecified: F31.9

## 2019-08-07 HISTORY — DX: Malignant (primary) neoplasm, unspecified: C80.1

## 2019-08-07 LAB — GLUCOSE, CAPILLARY
Glucose-Capillary: 93 mg/dL (ref 70–99)
Glucose-Capillary: 131 mg/dL — ABNORMAL HIGH (ref 70–99)

## 2019-08-07 LAB — POCT PREGNANCY, URINE: Preg Test, Ur: NEGATIVE

## 2019-08-07 SURGERY — INSERTION, TUNNELED CENTRAL VENOUS DEVICE, WITH PORT
Anesthesia: General | Site: Chest

## 2019-08-07 MED ORDER — ACETAMINOPHEN 10 MG/ML IV SOLN
INTRAVENOUS | Status: DC | PRN
  Administered 2019-08-07: 1000 mg via INTRAVENOUS

## 2019-08-07 MED ORDER — ONDANSETRON HCL 4 MG/2ML IJ SOLN
INTRAMUSCULAR | Status: AC
  Filled 2019-08-07: qty 2

## 2019-08-07 MED ORDER — FENTANYL CITRATE (PF) 100 MCG/2ML IJ SOLN: 25.0000 ug | INTRAMUSCULAR | Status: DC | PRN

## 2019-08-07 MED ORDER — ONDANSETRON HCL 4 MG/2ML IJ SOLN
INTRAMUSCULAR | Status: DC | PRN
  Administered 2019-08-07: 4 mg via INTRAVENOUS

## 2019-08-07 MED ORDER — SODIUM CHLORIDE (PF) 0.9 % IJ SOLN
INTRAMUSCULAR | Status: AC
  Filled 2019-08-07: qty 50

## 2019-08-07 MED ORDER — FENTANYL CITRATE (PF) 100 MCG/2ML IJ SOLN
INTRAMUSCULAR | Status: AC
  Filled 2019-08-07: qty 2

## 2019-08-07 MED ORDER — CEFAZOLIN SODIUM-DEXTROSE 2-4 GM/100ML-% IV SOLN
INTRAVENOUS | Status: AC
  Filled 2019-08-07: qty 100

## 2019-08-07 MED ORDER — HEPARIN SODIUM (PORCINE) 5000 UNIT/ML IJ SOLN
INTRAMUSCULAR | Status: AC
  Filled 2019-08-07: qty 1

## 2019-08-07 MED ORDER — CEFAZOLIN SODIUM-DEXTROSE 2-4 GM/100ML-% IV SOLN
2.0000 g | INTRAVENOUS | Status: AC
  Administered 2019-08-07: 2 g via INTRAVENOUS

## 2019-08-07 MED ORDER — PROPOFOL 10 MG/ML IV BOLUS
INTRAVENOUS | Status: DC | PRN
  Administered 2019-08-07: 120 mg via INTRAVENOUS

## 2019-08-07 MED ORDER — DEXAMETHASONE SODIUM PHOSPHATE 10 MG/ML IJ SOLN
INTRAMUSCULAR | Status: DC | PRN
  Administered 2019-08-07: 10 mg via INTRAVENOUS

## 2019-08-07 MED ORDER — BUPIVACAINE-EPINEPHRINE (PF) 0.25% -1:200000 IJ SOLN
INTRAMUSCULAR | Status: DC | PRN
  Administered 2019-08-07: 8 mL

## 2019-08-07 MED ORDER — LIDOCAINE HCL (CARDIAC) PF 100 MG/5ML IV SOSY
PREFILLED_SYRINGE | INTRAVENOUS | Status: DC | PRN
  Administered 2019-08-07: 60 mg via INTRAVENOUS

## 2019-08-07 MED ORDER — DEXMEDETOMIDINE HCL 200 MCG/2ML IV SOLN
INTRAVENOUS | Status: DC | PRN
  Administered 2019-08-07: 8 ug via INTRAVENOUS
  Administered 2019-08-07: 12 ug via INTRAVENOUS
  Administered 2019-08-07: 8 ug via INTRAVENOUS

## 2019-08-07 MED ORDER — MIDAZOLAM HCL 2 MG/2ML IJ SOLN
INTRAMUSCULAR | Status: DC | PRN
  Administered 2019-08-07: 2 mg via INTRAVENOUS

## 2019-08-07 MED ORDER — ACETAMINOPHEN NICU IV SYRINGE 10 MG/ML
INTRAVENOUS | Status: AC
  Filled 2019-08-07: qty 1

## 2019-08-07 MED ORDER — LIDOCAINE HCL (PF) 2 % IJ SOLN
INTRAMUSCULAR | Status: AC
  Filled 2019-08-07: qty 10

## 2019-08-07 MED ORDER — DEXAMETHASONE SODIUM PHOSPHATE 10 MG/ML IJ SOLN
INTRAMUSCULAR | Status: AC
  Filled 2019-08-07: qty 1

## 2019-08-07 MED ORDER — FENTANYL CITRATE (PF) 100 MCG/2ML IJ SOLN
INTRAMUSCULAR | Status: DC | PRN
  Administered 2019-08-07 (×4): 25 ug via INTRAVENOUS

## 2019-08-07 MED ORDER — BUPIVACAINE HCL (PF) 0.25 % IJ SOLN
INTRAMUSCULAR | Status: AC
  Filled 2019-08-07: qty 30

## 2019-08-07 MED ORDER — MIDAZOLAM HCL 2 MG/2ML IJ SOLN
INTRAMUSCULAR | Status: AC
  Filled 2019-08-07: qty 2

## 2019-08-07 MED ORDER — NABUMETONE 500 MG PO TABS: 500.0000 mg | ORAL_TABLET | Freq: Two times a day (BID) | ORAL | 0 refills | Status: AC

## 2019-08-07 MED ORDER — SODIUM CHLORIDE 0.9 % IV SOLN: INTRAVENOUS | Status: DC

## 2019-08-07 MED ORDER — PROPOFOL 500 MG/50ML IV EMUL
INTRAVENOUS | Status: AC
  Filled 2019-08-07: qty 50

## 2019-08-07 MED ORDER — ONDANSETRON HCL 4 MG PO TABS
ORAL_TABLET | ORAL | Status: AC
  Filled 2019-08-07: qty 1

## 2019-08-07 MED ORDER — EPINEPHRINE PF 1 MG/ML IJ SOLN
INTRAMUSCULAR | Status: AC
  Filled 2019-08-07: qty 1

## 2019-08-07 MED ORDER — ONDANSETRON HCL 4 MG/2ML IJ SOLN
4.0000 mg | Freq: Once | INTRAMUSCULAR | Status: AC | PRN
  Administered 2019-08-07: 4 mg via INTRAVENOUS

## 2019-08-07 MED ORDER — OXYCODONE HCL 5 MG PO TABS
ORAL_TABLET | ORAL | Status: AC
  Filled 2019-08-07: qty 1

## 2019-08-07 MED ORDER — OXYCODONE HCL 5 MG PO TABS: 5.0000 mg | ORAL_TABLET | Freq: Once | ORAL | Status: DC

## 2019-08-07 SURGICAL SUPPLY — 33 items
BLADE SURG SZ11 CARB STEEL (BLADE) ×3 IMPLANT
BOOT SUTURE AID YELLOW STND (SUTURE) ×3 IMPLANT
CANISTER SUCT 1200ML W/VALVE (MISCELLANEOUS) ×3 IMPLANT
CHLORAPREP W/TINT 26 (MISCELLANEOUS) ×3 IMPLANT
COVER LIGHT HANDLE STERIS (MISCELLANEOUS) ×6 IMPLANT
COVER PROBE FLX POLY STRL (MISCELLANEOUS) ×2 IMPLANT
COVER WAND RF STERILE (DRAPES) ×3 IMPLANT
DERMABOND ADVANCED (GAUZE/BANDAGES/DRESSINGS) ×2
DERMABOND ADVANCED .7 DNX12 (GAUZE/BANDAGES/DRESSINGS) ×1 IMPLANT
DRAPE C-ARM XRAY 36X54 (DRAPES) ×3 IMPLANT
ELECT REM PT RETURN 9FT ADLT (ELECTROSURGICAL) ×3
ELECTRODE REM PT RTRN 9FT ADLT (ELECTROSURGICAL) ×1 IMPLANT
GLOVE BIO SURGEON STRL SZ 6.5 (GLOVE) ×2 IMPLANT
GLOVE BIO SURGEONS STRL SZ 6.5 (GLOVE) ×1
GLOVE BIOGEL PI IND STRL 6.5 (GLOVE) ×1 IMPLANT
GLOVE BIOGEL PI INDICATOR 6.5 (GLOVE) ×4
GOWN STRL REUS W/ TWL LRG LVL3 (GOWN DISPOSABLE) ×3 IMPLANT
GOWN STRL REUS W/TWL LRG LVL3 (GOWN DISPOSABLE) ×6
KIT PORT POWER 8FR ISP CVUE (Port) ×3 IMPLANT
KIT TURNOVER KIT A (KITS) ×3 IMPLANT
LABEL OR SOLS (LABEL) ×3 IMPLANT
NDL FILTER BLUNT 18X1 1/2 (NEEDLE) ×1 IMPLANT
NEEDLE FILTER BLUNT 18X 1/2SAF (NEEDLE) ×2
NEEDLE FILTER BLUNT 18X1 1/2 (NEEDLE) ×1 IMPLANT
PACK PORT-A-CATH (MISCELLANEOUS) ×3 IMPLANT
SUT MNCRL AB 4-0 PS2 18 (SUTURE) ×3 IMPLANT
SUT PROLENE 2 0 FS (SUTURE) ×3 IMPLANT
SUT VIC AB 2-0 SH 27 (SUTURE) ×2
SUT VIC AB 2-0 SH 27XBRD (SUTURE) ×1 IMPLANT
SUT VIC AB 3-0 SH 27 (SUTURE) ×2
SUT VIC AB 3-0 SH 27X BRD (SUTURE) ×1 IMPLANT
SYR 10ML LL (SYRINGE) ×3 IMPLANT
SYR 3ML LL SCALE MARK (SYRINGE) ×3 IMPLANT

## 2019-08-07 NOTE — Anesthesia Preprocedure Evaluation (Addendum)
Anesthesia Evaluation  Patient identified by MRN, date of birth, ID band Patient awake    Reviewed: Allergy & Precautions, NPO status , Patient's Chart, lab work & pertinent test results  Airway Mallampati: III       Dental   Pulmonary neg pulmonary ROS,    Pulmonary exam normal        Cardiovascular negative cardio ROS Normal cardiovascular exam     Neuro/Psych PSYCHIATRIC DISORDERS Anxiety Depression Bipolar Disorder negative neurological ROS     GI/Hepatic Neg liver ROS, GERD  Medicated,  Endo/Other  diabetes  Renal/GU negative Renal ROS  negative genitourinary   Musculoskeletal negative musculoskeletal ROS (+)   Abdominal Normal abdominal exam  (+)   Peds negative pediatric ROS (+)  Hematology negative hematology ROS (+)   Anesthesia Other Findings Past Medical History: No date: Allergic rhinitis No date: Anxiety No date: Bipolar disorder ([place])     Comment:  depression No date: Cancer ([place])     Comment:  breast right No date: Depression No date: Diabetes ([facility]) No date: GERD (gastroesophageal reflux disease) No date: Hyperlipidemia  Reproductive/Obstetrics                             Anesthesia Physical Anesthesia Plan  ASA: II  Anesthesia Plan: General   Post-op Pain Management:    Induction: Intravenous  PONV Risk Score and Plan:   Airway Management Planned: Oral ETT and LMA  Additional Equipment:   Intra-op Plan:   Post-operative Plan: Extubation in OR  Informed Consent: I have reviewed the patients History and Physical, chart, labs and discussed the procedure including the risks, benefits and alternatives for the proposed anesthesia with the patient or authorized representative who has indicated his/her understanding and acceptance.     Dental advisory given  Plan Discussed with: CRNA and Surgeon  Anesthesia Plan Comments:        Anesthesia Quick  Evaluation

## 2019-08-07 NOTE — Anesthesia Post-op Follow-up Note (Signed)
Anesthesia QCDR form completed.        

## 2019-08-07 NOTE — Progress Notes (Signed)
Attempted to reach patient for Chemo Care visit but was unable to reach by phone.

## 2019-08-07 NOTE — OR Nursing (Signed)
Nausea with no vomiting.  Peppermint oil for inhalation for nausea.  Zofran as ordered IV given

## 2019-08-07 NOTE — Progress Notes (Signed)
Per patient she has taken oxycodone and hydrocodone before in the past and has not had any breathing issues

## 2019-08-07 NOTE — Progress Notes (Signed)
Met patient in chemotherapy class.  Given Breast Cancer Treatment Handbook/folders with hospital services/contact information.

## 2019-08-07 NOTE — Anesthesia Procedure Notes (Signed)
Procedure Name: LMA Insertion Date/Time: 08/07/2019 9:31 AM Performed by: Lia Foyer, CRNA Pre-anesthesia Checklist: Patient identified, Emergency Drugs available, Suction available and Patient being monitored Patient Re-evaluated:Patient Re-evaluated prior to induction Oxygen Delivery Method: Circle system utilized Preoxygenation: Pre-oxygenation with 100% oxygen Induction Type: IV induction Ventilation: Mask ventilation without difficulty LMA: LMA inserted LMA Size: 4.0 Number of attempts: 1 Airway Equipment and Method: Patient positioned with wedge pillow Tube secured with: Tape

## 2019-08-07 NOTE — Transfer of Care (Signed)
Immediate Anesthesia Transfer of Care Note  Patient: Heather Bell  Procedure(s) Performed: INSERTION PORT-A-CATH (N/A Chest)  Patient Location: PACU  Anesthesia Type:General  Level of Consciousness: awake and patient cooperative  Airway & Oxygen Therapy: Patient Spontanous Breathing and Patient connected to face mask oxygen  Post-op Assessment: Report given to RN, Post -op Vital signs reviewed and stable and Patient moving all extremities X 4  Post vital signs: Reviewed and stable  Last Vitals:  Vitals Value Taken Time  BP 108/73 08/07/19 1045  Temp 36.2 C 08/07/19 1045  Pulse 87 08/07/19 1049  Resp 29 08/07/19 1049  SpO2 100 % 08/07/19 1049  Vitals shown include unvalidated device data.  Last Pain:  Vitals:   08/07/19 0815  TempSrc: Temporal  PainSc: 0-No pain         Complications: No apparent anesthesia complications

## 2019-08-07 NOTE — Interval H&P Note (Signed)
History and Physical Interval Note:  08/07/2019 8:50 AM  Heather Bell  has presented today for surgery, with the diagnosis of C50.911, C77.3 Breast cancer metastisized to Axillary lymphnode, rt.  The various methods of treatment have been discussed with the patient and family. After consideration of risks, benefits and other options for treatment, the patient has consented to  Procedure(s): INSERTION PORT-A-CATH (N/A) as a surgical intervention.  The patient's history has been reviewed, patient examined, no change in status, stable for surgery.  I have reviewed the patient's chart and labs.  Questions were answered to the patient's satisfaction.  Patient expressing that prefers having port placed on the subclavian vein due to anxiety problem feeling the catheter on the below the skin. Oriented about higher risk of pneumothorax. Patient still want to proceed with subclavian if possible.    Herbert Pun

## 2019-08-07 NOTE — Op Note (Signed)
SURGICAL PROCEDURE REPORT  DATE OF PROCEDURE: 08/07/2019   SURGEON: Dr. Windell Moment   ANESTHESIA: Local with general anesthesia   PRE-OPERATIVE DIAGNOSIS: Advanced breast cancer requiring durable central venous access for chemotherapy   POST-OPERATIVE DIAGNOSIS: Advanced breast cancer requiring durable central venous access for chemotherapy   PROCEDURE(S):  1.) Percutaneous access of Left subclavian vein under ultrasound guidance 2.) Insertion of tunneled Left subclavian central venous catheter with subcutaneous port  INTRAOPERATIVE FINDINGS: Patent easily compressible Left subclavian vein with appropriate respiratory variations and well-secured tunneled central venous catheter with subcutaneous port at completion of the procedure  ESTIMATED BLOOD LOSS: Minimal (<20 mL)   SPECIMENS: None   IMPLANTS: 56F tunneled Bard PowerPort central venous catheter with subcutaneous port  DRAINS: None   COMPLICATIONS: None apparent   CONDITION AT COMPLETION: Hemodynamically stable, awake   DISPOSITION: PACU   INDICATION(S) FOR PROCEDURE:  Patient is a 43 y.o. female who presented with advanced breast cancer requiring durable central venous access for chemotherapy. All risks, benefits, and alternatives to above elective procedures were discussed with the patient, who elected to proceed, and informed consent was accordingly obtained at that time.  DETAILS OF PROCEDURE:  Patient was brought to the operative suite and appropriately identified. In Trendelenburg position, Left subclavian venous access site was prepped and draped in the usual sterile fashion, and following a brief timeout, percutaneous Left subclavian venous access was obtained under ultrasound guidance using Seldinger technique, by which local anesthetic was injected over the Left subclavian vein, and access needle was inserted under direct ultrasound visualization into the Left subvlavian vein, through which soft guidewire was  advanced, over which access needle was withdrawn. Guidewire was secured, attention was directed to injection of local anesthetic along the planned tunnel site, 2-3 cm transverse Left chest incision was made and confirmed to accommodate the subcutaneous port, and flushed catheter was tunneled retrograde from the port site over the Left chest to the Left subclavian access site with the attached port well-secured to the catheter and within the subcutaneous pocket. Insertion sheath was advanced over the guidewire, which was withdrawn along with the insertion sheath dilator. The catheter was introduced through the sheath and left on the [place] junction under fluoro guidance and catheter cut to desire lenght. Catheter connected to port and fixed to the pocket on two side to avoid twisting. Port was confirmed to withdraw blood and flush easily, after which concentrated heparin was instilled into the port and catheter. Dermis at the subcutaneous pocket was re-approximated using buried interrupted 3-0 Vicryl suture, and 4-0 Monocryl suture was used to re-approximate skin at the insertion and subcutaneous port sites in running subcuticular fashion for the subcutaneous port and buried interrupted fashion for the insertion site. Skin was cleaned, dried, and sterile skin glue was applied. Patient was then safely transferred to PACU for a chest x-ray. Ultrasound images are available on paper chart and Fluoroscopy guidance images are available in Epic.

## 2019-08-07 NOTE — Discharge Instructions (Signed)
  Diet: Resume home heart healthy regular diet.   Activity: No heavy lifting >20 pounds (children, pets, laundry, garbage) or strenuous activity until follow-up, but light activity and walking are encouraged. Do not drive or drink alcohol if taking narcotic pain medications.  Wound care: May shower with soapy water and pat dry (do not rub incisions), but no baths or submerging incision underwater until follow-up. (no swimming)   Medications: Resume all home medications. For mild to moderate pain: acetaminophen (Tylenol) or nabumetone.   Call office [phone]) at any time if any questions, worsening pain, fevers/chills, bleeding, drainage from incision site, or other concerns.  AMBULATORY SURGERY  DISCHARGE INSTRUCTIONS   1) The drugs that you were given will stay in your system until tomorrow so for the next 24 hours you should not:  A) Drive an automobile B) Make any legal decisions C) Drink any alcoholic beverage   2) You may resume regular meals tomorrow.  Today it is better to start with liquids and gradually work up to solid foods.  You may eat anything you prefer, but it is better to start with liquids, then soup and crackers, and gradually work up to solid foods.   3) Please notify your doctor immediately if you have any unusual bleeding, trouble breathing, redness and pain at the surgery site, drainage, fever, or pain not relieved by medication.   4) Additional Instructions:    Please contact your physician with any problems or Same Day Surgery at [phone], Monday through Friday 6 am to 4 pm, or [place] at [facility] number at [phone].

## 2019-08-10 ENCOUNTER — Other Ambulatory Visit: Payer: Self-pay | Admitting: General Surgery

## 2019-08-10 MED ORDER — HYDROCODONE-ACETAMINOPHEN 5-325 MG PO TABS: 1.0000 | ORAL_TABLET | ORAL | 0 refills | Status: AC | PRN

## 2019-08-10 NOTE — Anesthesia Postprocedure Evaluation (Signed)
Anesthesia Post Note  Patient: Heather Bell  Procedure(s) Performed: INSERTION PORT-A-CATH (N/A Chest)  Patient location during evaluation: PACU Anesthesia Type: General Level of consciousness: awake and alert and oriented Pain management: pain level controlled Vital Signs Assessment: post-procedure vital signs reviewed and stable Respiratory status: spontaneous breathing Cardiovascular status: blood pressure returned to baseline Anesthetic complications: no     Last Vitals:  Vitals:   08/07/19 1128 08/07/19 1215  BP: 116/69 110/64  Pulse: 88 87  Resp: 16 16  Temp: (!) 36.3 C   SpO2: 100% 100%    Last Pain:  Vitals:   08/10/19 0832  TempSrc:   PainSc: 5                  Khristy Kalan

## 2019-08-11 ENCOUNTER — Encounter
Admission: RE | Admit: 2019-08-11 | Discharge: 2019-08-11 | Disposition: A | Discharge status: Home / Self Care | Payer: BC Managed Care – PPO | Source: Ambulatory Visit | Attending: Hematology and Oncology | Admitting: Hematology and Oncology

## 2019-08-11 ENCOUNTER — Encounter: Payer: Self-pay | Admitting: *Deleted

## 2019-08-11 ENCOUNTER — Other Ambulatory Visit: Payer: Self-pay

## 2019-08-11 DIAGNOSIS — C50919 Malignant neoplasm of unspecified site of unspecified female breast: Secondary | ICD-10-CM | POA: Diagnosis not present

## 2019-08-11 LAB — GLUCOSE, CAPILLARY: Glucose-Capillary: 77 mg/dL (ref 70–99)

## 2019-08-11 MED ORDER — FLUDEOXYGLUCOSE F - 18 (FDG) INJECTION
7.0000 | Freq: Once | INTRAVENOUS | Status: AC | PRN
  Administered 2019-08-11: 7.753 via INTRAVENOUS

## 2019-08-13 ENCOUNTER — Other Ambulatory Visit: Payer: Self-pay

## 2019-08-13 ENCOUNTER — Ambulatory Visit
Admission: RE | Admit: 2019-08-13 | Discharge: 2019-08-13 | Disposition: A | Discharge status: Home / Self Care | Payer: BC Managed Care – PPO | Source: Ambulatory Visit | Attending: Hematology and Oncology | Admitting: Hematology and Oncology

## 2019-08-13 DIAGNOSIS — Z01810 Encounter for preprocedural cardiovascular examination: Secondary | ICD-10-CM | POA: Insufficient documentation

## 2019-08-13 DIAGNOSIS — C50919 Malignant neoplasm of unspecified site of unspecified female breast: Secondary | ICD-10-CM

## 2019-08-13 DIAGNOSIS — E785 Hyperlipidemia, unspecified: Secondary | ICD-10-CM | POA: Diagnosis not present

## 2019-08-13 DIAGNOSIS — C7981 Secondary malignant neoplasm of breast: Secondary | ICD-10-CM | POA: Diagnosis present

## 2019-08-13 NOTE — Progress Notes (Signed)
*  PRELIMINARY RESULTS* Echocardiogram 2D Echocardiogram has been performed.  Heather Bell 08/13/2019, 1:31 PM

## 2019-08-15 NOTE — Progress Notes (Signed)
[facility]  [address], [address] [place], [place] [address] Phone: [phone]  Fax: [phone]   Telemedicine Office Visit:  08/17/2019  Referring physician: [facility] [place] connected with Heather Bell on 08/17/19 at 1:15 PM by videoconferencing and verified that I was speaking with the correct person using 2 identifiers.  The patient was at home.  I discussed the limitations, risk, security and privacy concerns of performing an evaluation and management service by videoconferencing and the availability of in person appointments.  I also discussed with the patient that there may be a patient responsible charge related to this service.  The patient expressed understanding and agreed to proceed.   Chief Complaint: Heather Bell is a 43 y.o. female with  clinical stage IIB triple negative right breast cancer who is seen for review of interim studies and finalization of treatment plan.  HPI: The patient was last seen in the medical oncology clinic on 07/30/2019 for initial consultation. At that time, she had lost 30 pounds in the past 8 months. She denied any bone pain. Exam revealed a 3-3.5 cm right breast mass with associated hematoma and a small axillary lymph node. CA27.29 was 8.5  Port-a-cath was placed on 08/07/2019 by Dr Windell Moment.  PET scan on 08/11/2019 showed a hypermetabolic mass in the inferior RIGHT breast consistent with her breast carcinoma. There was no evidence hypermetabolic nodal metastasis.  There was no distant metastatic disease.  There was no pulmonary metastasis or skeletal metastasis.  Echo on 08/13/2019 revealed an ejection fraction of 50 to 55%.  The left ventricle demonstrated global hypokinesis.  Symptomatically, she notes that the port site is still sore, but is doing fine otherwise.  She describes some left shoulder pain.  Pain was present before her port was placed.  She denies any chest pain, shortness of breath,  lower extremity edema, orthopnea or PND.  She notes a dystonic reaction to Compazine.  She has had Ativan in the past.   Past Medical History:  Diagnosis Date  . Allergic rhinitis   . Anxiety   . Bipolar disorder ([place])    depression  . Cancer [facility])    breast right  . Depression   . Diabetes ([place])   . GERD (gastroesophageal reflux disease)   . Hyperlipidemia     Past Surgical History:  Procedure Laterality Date  . BREAST BIOPSY Right 07/22/2019   mass bx, path pending, heart marker  . BREAST BIOPSY Right 07/22/2019   LN bx, path pending,  butterfly hydromarker  . IMAGE GUIDED SINUS SURGERY    . KNEE SURGERY    . NASAL SEPTUM SURGERY    . PORTACATH PLACEMENT N/A 08/07/2019   Procedure: INSERTION PORT-A-CATH;  Surgeon: Herbert Pun, MD;  Location: [facility] ORS;  Service: General;  Laterality: N/A;  . TONSILLECTOMY      Family History  Problem Relation Age of Onset  . Lung cancer Father   . Lung cancer Paternal Uncle     Social History:  reports that she has never smoked. She has never used smokeless tobacco. She reports that she does not drink alcohol or use drugs. She denies any exposure to radiation or toxins. She has 2 children (42 year old daughter and 55 year old son).  She use to work in the [employer]. She is currently not working. Her husband is [name]. The patient is accompanied by her husband (who is in [facility] due to work)  today.  Participants in the patient's visit and their role in the encounter included the patient, Samul Dada, scribe, and [employer], CMA, today.  The intake visit was provided by Park Meo and Vito Berger, CMA.    Allergies:  Allergies  Allergen Reactions  . Compazine [Prochlorperazine Edisylate] Other (See Comments)    Acute dystonic reaction  . Morphine And Related Shortness Of Breath  . Doxycycline Other (See Comments)    "made all my symptoms worse"   . Fluticasone Other (See Comments)    Doesn't  work  . Levofloxacin Other (See Comments)    "made all my symptoms worse"     Current Medications: Current Outpatient Medications  Medication Sig Dispense Refill  . amphetamine-dextroamphetamine (ADDERALL XR) 25 MG 24 hr capsule Take 25 mg by mouth daily.    . cetirizine (ZYRTEC) 10 MG tablet Take 10 mg by mouth daily.     . clonazePAM (KLONOPIN) 1 MG tablet Take 1 mg by mouth 3 (three) times daily as needed for anxiety.    Marland Kitchen doxepin (SINEQUAN) 25 MG capsule Take 25 mg by mouth at bedtime.     . lamoTRIgine (LAMICTAL) 200 MG tablet Take 200 mg by mouth at bedtime.     Marland Kitchen lurasidone (LATUDA) 40 MG TABS tablet Take 40 mg by mouth at bedtime. Take with 60 mg to equal 100 mg at night    . Lurasidone HCl 60 MG TABS Take 60 mg by mouth at bedtime. Take with 40 mg to equal 100 mg at night    . metFORMIN (GLUCOPHAGE-XR) 500 MG 24 hr tablet Take 1,000 mg by mouth 2 (two) times daily.    . mometasone (NASONEX) 50 MCG/ACT nasal spray Place 2 sprays into the nose daily as needed (allergies).     . Multiple Vitamin (MULTIVITAMIN WITH MINERALS) TABS tablet Take 1 tablet by mouth daily.    . norethindrone (MICRONOR,CAMILA,ERRIN) 0.35 MG tablet Take 1 tablet by mouth daily.    Marland Kitchen omeprazole (PRILOSEC) 20 MG capsule Take 20 mg by mouth daily.     . QUEtiapine (SEROQUEL XR) 300 MG 24 hr tablet Take 300 mg by mouth at bedtime.    . simvastatin (ZOCOR) 20 MG tablet Take 20 mg by mouth daily.    . temazepam (RESTORIL) 15 MG capsule Take 15 mg by mouth at bedtime as needed for sleep.    Marland Kitchen topiramate (TOPAMAX) 100 MG tablet Take 50-100 mg by mouth See admin instructions. Take 100 mg in the morning and 50 mg at night    . bismuth subsalicylate (PEPTO BISMOL) 262 MG/15ML suspension Take 30 mLs by mouth every 6 (six) hours as needed for indigestion or diarrhea or loose stools.    Marland Kitchen ibuprofen (ADVIL) 200 MG tablet Take 400-800 mg by mouth every 6 (six) hours as needed for headache or moderate pain.     No current  facility-administered medications for this visit.    Review of Systems  Constitutional: Negative.  Negative for chills, diaphoresis, fever, malaise/fatigue and weight loss (30 pounds in 8 months; weight stable).       Feels "ok".  HENT: Negative.  Negative for congestion, ear pain, hearing loss, nosebleeds, sinus pain and sore throat.   Eyes: Negative.  Negative for blurred vision and double vision.  Respiratory: Negative.  Negative for cough, sputum production and shortness of breath.   Cardiovascular: Negative.  Negative for chest pain, palpitations, orthopnea and leg swelling.  Gastrointestinal: Positive for constipation. Negative for abdominal pain, blood in  stool, diarrhea, heartburn, melena, nausea and vomiting.       Reflux.  Genitourinary: Negative.  Negative for dysuria, flank pain, frequency, hematuria and urgency.       Premenopausal.  Musculoskeletal: Positive for joint pain (left shoulder pain). Negative for back pain, falls, myalgias and neck pain.  Skin: Negative.  Negative for itching and rash.  Neurological: Negative.  Negative for dizziness, tingling, sensory change, speech change, focal weakness, weakness and headaches.  Endo/Heme/Allergies: Bruises/bleeds easily (bruising only).       Type II diabetes.  Psychiatric/Behavioral: Negative for memory loss. The patient is not nervous/anxious and does not have insomnia.        Bipolar affective disorder. Mood "ok".  All other systems reviewed and are negative.  Performance status (ECOG):  0  Vitals There were no vitals taken for this visit.   Physical Exam  Constitutional: She appears well-developed and well-nourished.  HENT:  Head: Normocephalic and atraumatic.  Brown hair pulled back.  Eyes: Conjunctivae and EOM are normal.  Brown eyes.  Psychiatric: She has a normal mood and affect. Her behavior is normal. Judgment and thought content normal.  Nursing note reviewed.   No visits with results within 3 Day(s) from  this visit.  Latest known visit with results is:  Hospital Outpatient Visit on 08/11/2019  Component Date Value Ref Range Status  . Glucose-Capillary 08/11/2019 77  70 - 99 mg/dL Final    Assessment:  Heather Bell is a 43 y.o. female with clinical T1cN1 triple negative right breast cancer s/p ultrasound guided core biopsy.  Pathology revealed a grade III invasive mammary carcinoma, no special type of the right breast.  Specimen was 10 mm.  Ductal carcinoma in situ (DCIS) was not identified. Right axillary lymph node was positive for metastatic carcinoma.  Tumor was ER - (< 1%), PR - (< 1%), and Her2/neu -.  Bilateral mammogram and right unilateral ultrasound on 07/10/2019 revealed a 1.7 x 1.6 x 1.6 cm mass at the 7 o'clock region of the right breast 7 cm from the nipple.  There was an abnormal 1.4 x 0.7 x 0.9 cm axillary lymph node. There was a second 3 mm axillary lymph node with minimal focal cortical thickening.  An ultrasound-guided core biopsies of the mass and the enlarged right axillary lymph node was recommended. BI-RADS category 5.   PET scan on 08/11/2019 showed a hypermetabolic mass in the inferior RIGHT breast consistent with her breast carcinoma. There was no evidence hypermetabolic nodal metastasis.  There was no distant metastatic disease.  There was no pulmonary metastasis or skeletal metastasis.  Echo on 08/13/2019 revealed an ejection fraction of 50 to 55%.  The left ventricle demonstrated global hypokinesis.    She is on birth control pills.  Symptomatically, she is doing well.  Weight is stable.  Port site is a little tender.  She notes some left shoulder pain.  Plan: 1.   Clinical stage IIB triple negative right breast cancer             Review interval PET scan- no evidence of metastatic disease.  Review plan for neoadjuvant chemotherapy.   Review plan for weekly Taxol + carboplatin x 12 followed by [facility] x 4 with Neulasta support.   Potential side effects  re-reviewed.   Patient attended chemotherapy class.   Rx: EMLA cream, ondansetron, Ativan.  Discuss echo- EF 50-55% with notation of global hypokinesis.   EF appears adequate (>= 50%) with plan for total anthracycline dose  240 mg/m2 over 4 cycles.    Suspect patient will do well with treatment.     Consider close monitoring.    Discuss referral to cardiology prior to initiation of chemotherapy to ensure no contraindication and recommendations.     Patient in agreement.  I have spoken to Dr End who will see patient on 08/20/2019.             Discuss patient's birth control pills.   Discuss discontinuation of current BCP.   Consider alternative methods of BCP.  Follow-up [facility] genetic testing. 2.   Left ventricle hypokinesis  Echo revealed EF 50-55% with global hypokinesis.  Patient without any symptoms of CHF.  Unclear why EF borderline.  Referral to cardiology, Dr End, prior to initiation of chemotherapy.   Per Dr End, appt on 08/20/2019 at 4:20 pm. 3.   RTC on 08/25/2019 for MD assessment, labs (CBC with diff, CMP, Mg), and week #1 carboplatin and Taxol.  I discussed the assessment and treatment plan with the patient.  The patient was provided an opportunity to ask questions and all were answered.  The patient agreed with the plan and demonstrated an understanding of the instructions.  The patient was advised to call back if the symptoms worsen or if the condition fails to improve as anticipated.  I provided 26 minutes (1:12 PM - 1:38 PM) of face-to-face time during this this encounter and > 50% was spent counseling as documented under my assessment and plan.    Lequita Asal, MD, PhD    08/17/2019, 1:38 PM  I, Samul Dada, am acting as a scribe for Lequita Asal, MD.  I, [place] Mike Gip, MD, have reviewed the above documentation for accuracy and completeness, and I agree with the above.

## 2019-08-17 ENCOUNTER — Other Ambulatory Visit: Payer: Self-pay

## 2019-08-17 ENCOUNTER — Inpatient Hospital Stay (HOSPITAL_BASED_OUTPATIENT_CLINIC_OR_DEPARTMENT_OTHER): Payer: BC Managed Care – PPO | Admitting: Hematology and Oncology

## 2019-08-17 ENCOUNTER — Encounter: Payer: Self-pay | Admitting: Hematology and Oncology

## 2019-08-17 DIAGNOSIS — I5189 Other ill-defined heart diseases: Secondary | ICD-10-CM

## 2019-08-17 DIAGNOSIS — Z7189 Other specified counseling: Secondary | ICD-10-CM | POA: Diagnosis not present

## 2019-08-17 DIAGNOSIS — C50919 Malignant neoplasm of unspecified site of unspecified female breast: Secondary | ICD-10-CM

## 2019-08-17 MED ORDER — ONDANSETRON HCL 8 MG PO TABS: 8.0000 mg | ORAL_TABLET | Freq: Two times a day (BID) | ORAL | 1 refills | Status: DC | PRN

## 2019-08-17 MED ORDER — LIDOCAINE-PRILOCAINE 2.5-2.5 % EX CREA: TOPICAL_CREAM | CUTANEOUS | 0 refills | Status: DC

## 2019-08-17 MED ORDER — LORAZEPAM 0.5 MG PO TABS: 0.5000 mg | ORAL_TABLET | Freq: Four times a day (QID) | ORAL | 0 refills | Status: DC | PRN

## 2019-08-20 ENCOUNTER — Other Ambulatory Visit: Payer: Self-pay

## 2019-08-20 ENCOUNTER — Ambulatory Visit (INDEPENDENT_AMBULATORY_CARE_PROVIDER_SITE_OTHER): Payer: BC Managed Care – PPO | Admitting: Internal Medicine

## 2019-08-20 ENCOUNTER — Telehealth: Payer: Self-pay

## 2019-08-20 ENCOUNTER — Encounter: Payer: Self-pay | Admitting: Internal Medicine

## 2019-08-20 VITALS — BP 98/60 | HR 97 | Ht 66.0 in | Wt 137.0 lb

## 2019-08-20 DIAGNOSIS — I429 Cardiomyopathy, unspecified: Secondary | ICD-10-CM

## 2019-08-20 NOTE — Telephone Encounter (Signed)
Faxed the patient PA for Emla cream to [id number]. faxed confirmation confirmed

## 2019-08-20 NOTE — Progress Notes (Signed)
[facility]  [address], [address] [place], [place] [address] Phone: [phone]  Fax: [phone]   Clinic Day:  08/25/2019  Referring physician: [facility], [place]  Chief Complaint: Heather Bell is a 43 y.o. female with clinical stage IIBtriple negativerightbreast cancer who is seen for assessment prior to week #1 of carboplatin and Taxol.   HPI: The patient was last seen in the medical oncology clinic on 08/17/2019 via telemedicine.  At that time, she was doing well. Weight was stable.  Port site was slightly tender s/p placement.  She described some left shoulder pain. We discussed a plan for weekly Taxol and carboplatin x 12 followed by [facility] x 4 with Neulasta support. [facility] genetic testing was pending.  Echo revealed an EF 50-55% with notation of global hypokinesis. She was referred to cardiology prior to initiation of chemotherapy.   She was seen by Dr. Nelva Bush, cardiologist, on 08/20/2019.  She felt well.  She had mild exertional dyspnea that was longstanding. She did not have any limitations to do her daily activities. She had occasional orthostatic lightheadedness.  Echo images were felt suboptimal.  MUGA scan was recommended.   MUGA on 08/24/2019 revealed an EF of 60%.  During the interim, she felt "ok". She notes her incision site is still tender. Her port site is healing nicely. She continues to have left shoulder pain. Pain increases with movement. She would like to seen by a surgeon for further evaluation. She notes having a recent eye appointment ; she denies any glaucoma.    The patient had questions regarding her nausea medication. I informed the patient about all her medications given during and after treatment. The patient was concerned about other medications that could help control her periods. She notes the only reason she was on birth control is to regulate her periods. I advised her to follow-up with her PCP.  I informed the  patient that she can not become pregnant during chemotherapy. Patient understood.   She consented to treatment today.    Past Medical History:  Diagnosis Date  . Allergic rhinitis   . Anxiety   . Bipolar disorder ([place])    depression  . Cancer [facility])    breast right  . Depression   . Diabetes ([place])   . GERD (gastroesophageal reflux disease)   . Hyperlipidemia     Past Surgical History:  Procedure Laterality Date  . BREAST BIOPSY Right 07/22/2019   mass bx, path pending, heart marker  . BREAST BIOPSY Right 07/22/2019   LN bx, path pending,  butterfly hydromarker  . IMAGE GUIDED SINUS SURGERY    . KNEE SURGERY    . NASAL SEPTUM SURGERY    . PORTACATH PLACEMENT N/A 08/07/2019   Procedure: INSERTION PORT-A-CATH;  Surgeon: Herbert Pun, MD;  Location: [facility] ORS;  Service: General;  Laterality: N/A;  . TONSILLECTOMY      Family History  Problem Relation Age of Onset  . Heart attack Mother 6       s/p CABG x 5  . Lung cancer Father   . Lung cancer Paternal Uncle   . Heart disease Paternal Uncle     Social History:  reports that she has never smoked. She has never used smokeless tobacco. She reports that she does not drink alcohol or use drugs. She denies any exposure to radiation or toxins.She has 2 children (65 year old daughter and 94 year old son).She use to work inthefast foodindustry. She is  currently not working. Her husband is [name]. [name] travels a lot for his job. The patient is alone today.  Allergies:  Allergies  Allergen Reactions  . Compazine [Prochlorperazine Edisylate] Other (See Comments)    Acute dystonic reaction  . Morphine And Related Shortness Of Breath  . Doxycycline Other (See Comments)    "made all my symptoms worse"   . Fluticasone Other (See Comments)    Doesn't work  . Levofloxacin Other (See Comments)    "made all my symptoms worse"     Current Medications: Current Outpatient Medications  Medication Sig Dispense Refill   . amphetamine-dextroamphetamine (ADDERALL XR) 25 MG 24 hr capsule Take 25 mg by mouth daily.    . cetirizine (ZYRTEC) 10 MG tablet Take 10 mg by mouth daily.     . clonazePAM (KLONOPIN) 1 MG tablet Take 1 mg by mouth 3 (three) times daily as needed for anxiety.    Marland Kitchen doxepin (SINEQUAN) 25 MG capsule Take 25 mg by mouth at bedtime.     Marland Kitchen ibuprofen (ADVIL) 200 MG tablet Take 400-800 mg by mouth every 6 (six) hours as needed for headache or moderate pain.    Marland Kitchen lamoTRIgine (LAMICTAL) 200 MG tablet Take 200 mg by mouth at bedtime.     . lidocaine-prilocaine (EMLA) cream Apply topically over port-a-cath 30 minutes to 1 hour prior to treatment. 30 g 0  . LORazepam (ATIVAN) 0.5 MG tablet Take 1 tablet (0.5 mg total) by mouth every 6 (six) hours as needed (Nausea or vomiting). 20 tablet 0  . lurasidone (LATUDA) 40 MG TABS tablet Take 40 mg by mouth at bedtime. Take with 60 mg to equal 100 mg at night    . Lurasidone HCl 60 MG TABS Take 60 mg by mouth at bedtime. Take with 40 mg to equal 100 mg at night    . metFORMIN (GLUCOPHAGE-XR) 500 MG 24 hr tablet Take 1,000 mg by mouth 2 (two) times daily.    . Multiple Vitamin (MULTIVITAMIN WITH MINERALS) TABS tablet Take 1 tablet by mouth daily.    Marland Kitchen omeprazole (PRILOSEC) 20 MG capsule Take 20 mg by mouth daily.     . QUEtiapine (SEROQUEL XR) 300 MG 24 hr tablet Take 300 mg by mouth at bedtime.    . simvastatin (ZOCOR) 20 MG tablet Take 20 mg by mouth daily.    . temazepam (RESTORIL) 15 MG capsule Take 15 mg by mouth at bedtime as needed for sleep.    Marland Kitchen topiramate (TOPAMAX) 100 MG tablet Take 50-100 mg by mouth See admin instructions. Take 100 mg in the morning and 50 mg at night    . mometasone (NASONEX) 50 MCG/ACT nasal spray Place 2 sprays into the nose daily as needed (allergies).     . ondansetron (ZOFRAN) 8 MG tablet Take 1 tablet (8 mg total) by mouth 2 (two) times daily as needed for refractory nausea / vomiting. Start on day 3 after carboplatin chemo.  (Patient not taking: Reported on 08/25/2019) 30 tablet 1   No current facility-administered medications for this visit.   Facility-Administered Medications Ordered in Other Visits  Medication Dose Route Frequency Provider Last Rate Last Admin  . heparin lock flush 100 unit/mL  500 Units Intravenous Once Lonie Rummell C, MD      . sodium chloride flush (NS) 0.9 % injection 10 mL  10 mL Intravenous PRN Lequita Asal, MD        Review of Systems  Constitutional: Negative.  Negative for  chills, diaphoresis, fever, malaise/fatigue and weight loss (stable).       Feels "ok".  HENT: Negative.  Negative for congestion, ear pain, hearing loss, nosebleeds, sinus pain, sore throat and tinnitus.   Eyes: Negative.  Negative for blurred vision and double vision.  Respiratory: Negative.  Negative for cough, hemoptysis, sputum production and shortness of breath.   Cardiovascular: Negative.  Negative for chest pain, palpitations, orthopnea and leg swelling.  Gastrointestinal: Negative.  Negative for abdominal pain, blood in stool, constipation, diarrhea, heartburn, melena, nausea and vomiting.       Reflux.  Genitourinary: Negative.  Negative for dysuria, flank pain, frequency, hematuria and urgency.       Premenopausal.  Musculoskeletal: Positive for joint pain (left shoulder; pain increases with movement). Negative for back pain, myalgias and neck pain.       Tender incision site.  Skin: Negative.  Negative for itching and rash.       Port site healing well.  Neurological: Negative.  Negative for dizziness, tingling, sensory change, weakness and headaches.  Endo/Heme/Allergies: Bruises/bleeds easily (bruising only).       Type II diabetes.  Psychiatric/Behavioral: Negative.  Negative for depression and memory loss. The patient is not nervous/anxious and does not have insomnia.        Bipolar affective disorder.  All other systems reviewed and are negative.  Performance status (ECOG):  0-1  Vitals Blood pressure 99/61, pulse 99, temperature (!) 97.4 F (36.3 C), temperature source Tympanic, resp. rate 18, height _0  (1.676 m), weight 136 lb 12.7 oz (62.1 kg), SpO2 100 %.   Physical Exam  Constitutional: She is oriented to person, place, and time. She appears well-developed and well-nourished. No distress.  HENT:  Head: Normocephalic and atraumatic.  Mouth/Throat: Oropharynx is clear and moist. No oropharyngeal exudate.  Short brown hair.  Eyes: Pupils are equal, round, and reactive to light. Conjunctivae and EOM are normal. No scleral icterus.  Brown eyes.  Cardiovascular: Normal rate, regular rhythm and normal heart sounds.  No murmur heard. Pulmonary/Chest: Effort normal and breath sounds normal. No respiratory distress. She has no wheezes. She has no rales. She exhibits no tenderness.  Abdominal: Soft. Bowel sounds are normal. She exhibits no distension. There is no abdominal tenderness. There is no rebound and no guarding.  Musculoskeletal:        General: No tenderness or edema. Normal range of motion.     Cervical back: Normal range of motion and neck supple.     Comments: Left shoulder has slight decreased ROM; can move arm forward easily and backward with slight limitation; unable to lift arm to 90 degrees.  Lymphadenopathy:       Head (right side): No preauricular, no posterior auricular and no occipital adenopathy present.       Head (left side): No preauricular, no posterior auricular and no occipital adenopathy present.    She has no cervical adenopathy.    She has no axillary adenopathy.       Right: No inguinal and no supraclavicular adenopathy present.       Left: No inguinal and no supraclavicular adenopathy present.  Neurological: She is alert and oriented to person, place, and time.  Skin: Skin is warm and dry. No rash noted. She is not diaphoretic. No erythema. No pallor.  Psychiatric: She has a normal mood and affect. Her behavior is normal.  Judgment and thought content normal.  Nursing note and vitals reviewed.   Infusion on 08/25/2019  Component  Date Value Ref Range Status  . Magnesium 08/25/2019 2.0  1.7 - 2.4 mg/dL Final   Performed at [facility], [address], [place], [place] [address]  . Sodium 08/25/2019 138  135 - 145 mmol/L Final  . Potassium 08/25/2019 3.7  3.5 - 5.1 mmol/L Final  . Chloride 08/25/2019 108  98 - 111 mmol/L Final  . CO2 08/25/2019 22  22 - 32 mmol/L Final  . Glucose, Bld 08/25/2019 107* 70 - 99 mg/dL Final  . BUN 08/25/2019 16  6 - 20 mg/dL Final  . Creatinine, Ser 08/25/2019 0.74  0.44 - 1.00 mg/dL Final  . Calcium 08/25/2019 9.3  8.9 - 10.3 mg/dL Final  . Total Protein 08/25/2019 7.6  6.5 - 8.1 g/dL Final  . Albumin 08/25/2019 4.2  3.5 - 5.0 g/dL Final  . AST 08/25/2019 16  15 - 41 U/L Final  . ALT 08/25/2019 10  0 - 44 U/L Final  . Alkaline Phosphatase 08/25/2019 54  38 - 126 U/L Final  . Total Bilirubin 08/25/2019 0.5  0.3 - 1.2 mg/dL Final  . GFR calc non Af Amer 08/25/2019 >60  >60 mL/min Final  . GFR calc Af Amer 08/25/2019 >60  >60 mL/min Final  . Anion gap 08/25/2019 8  5 - 15 Final   Performed at [facility], [address] [place], [place] [address]  . WBC 08/25/2019 7.3  4.0 - 10.5 K/uL Final  . RBC 08/25/2019 4.20  3.87 - 5.11 MIL/uL Final  . Hemoglobin 08/25/2019 11.3* 12.0 - 15.0 g/dL Final  . HCT 08/25/2019 35.4* 36.0 - 46.0 % Final  . MCV 08/25/2019 84.3  80.0 - 100.0 fL Final  . MCH 08/25/2019 26.9  26.0 - 34.0 pg Final  . MCHC 08/25/2019 31.9  30.0 - 36.0 g/dL Final  . RDW 08/25/2019 13.5  11.5 - 15.5 % Final  . Platelets 08/25/2019 331  150 - 400 K/uL Final  . nRBC 08/25/2019 0.0  0.0 - 0.2 % Final  . Neutrophils Relative % 08/25/2019 61  % Final  . Neutro Abs 08/25/2019 4.4  1.7 - 7.7 K/uL Final  . Lymphocytes Relative 08/25/2019 29  % Final  . Lymphs Abs 08/25/2019 2.2  0.7 - 4.0 K/uL Final  . Monocytes Relative 08/25/2019 7  % Final   . Monocytes Absolute 08/25/2019 0.5  0.1 - 1.0 K/uL Final  . Eosinophils Relative 08/25/2019 2  % Final  . Eosinophils Absolute 08/25/2019 0.2  0.0 - 0.5 K/uL Final  . Basophils Relative 08/25/2019 1  % Final  . Basophils Absolute 08/25/2019 0.1  0.0 - 0.1 K/uL Final  . Immature Granulocytes 08/25/2019 0  % Final  . Abs Immature Granulocytes 08/25/2019 0.02  0.00 - 0.07 K/uL Final   Performed at [facility], [address] [place], [place] [address]    Assessment:  Heather Bell is a 43 y.o. female with clinical T1cN1triple negativerightbreast cancers/p ultrasound guided core biopsy. Pathology revealeda grade IIIinvasive mammary carcinoma, no special type of the right breast. Specimen was 10 mm.Ductal carcinoma in situ(DCIS)was not identified.Right axillarylymph nodewas positive for metastatic carcinoma.Tumor was ER - (< 1%), PR - (< 1%), and Her2/neu -.  Bilateral mammogram and right unilateral ultrasoundon 07/10/2019 revealed a 1.7 x 1.6 x 1.6 cmmass at the7 o'clock region of the right breast 7 cm from the nipple. There was an abnormal1.4 x 0.7 x 0.9 cmaxillary lymph node. Therewas a second3 mmaxillary lymph node  with minimal focal cortical thickening.An ultrasound-guided core biopsies of the mass and the enlarged right axillary lymph node was recommended. BI-RADS category 5.   PET scan on 08/11/2019 showed a hypermetabolic mass in the inferior RIGHT breast consistent with her breast carcinoma. There was no evidence hypermetabolic nodal metastasis.  There was no distant metastatic disease.  There was no pulmonary metastasis or skeletal metastasis.  Echo on 08/13/2019 revealed an ejection fraction of 50 to 55%.  The left ventricle demonstrated global hypokinesis.  MUGA on 08/24/2019 revealed an EF of 60%.  She is followed by Dr End.  She is pre-menopausal.  She is on birth control pills.  Symptomatically, she feels "ok".  She notes some limited  range of motion of her left arm.  Exam is stable.  Plan: 1.   Labs today; CBC with diff, CMP, Mg. 2.   Urine pregnancy test. 3.   Clinical stageIIB triple negativeright breast cancer Review interval PET scan- no evidence of metastatic disease.             Review plan for neoadjuvant chemotherapy.                         Review plan for weekly Taxol + carboplatin x 12 followed by [facility] x 4 with Neulasta support.                         Potential side effects re-reviewed.                         Patient attended chemotherapy class.   Discuss plan for breast ultrasound at 6 and 12 weeks. Discuss patient's birth control pills.                         Discuss discontinuation of current BCP.                         Consider alternative methods of BCP with primary care physician.        Begin week #1 carboplatin and Taxol. 4.   Left ventricle hypokinesis  Initial echo revealed an EF 50-55% with notation of global hypokinesis.             MUGA revealed an EF of 60%.  Patient cleared for chemotherapy. 5.   Decreased ROM left arm  Etiology unclear.  Possibly related to port-a-cath placement.  Follow-up with Dr Windell Moment. 6.   Follow-up [facility] genetic testing. 7.   RTC in 1 week for MD assessment, labs (CBC with diff, CMP, Mg, urine pregnancy test) and week #2 carboplatin and Taxol.  I discussed the assessment and treatment plan with the patient.  The patient was provided an opportunity to ask questions and all were answered.  The patient agreed with the plan and demonstrated an understanding of the instructions.  The patient was advised to call back if the symptoms worsen or if the condition fails to improve as anticipated.  I provided 22 minutes of face-to-face time during this this encounter and > 50% was spent counseling as documented under my assessment and plan.    Lequita Asal, MD, PhD    08/25/2019, 9:14 AM  I, Selena Batten, am acting as scribe for  [employer]. Mike Gip, MD, PhD.  I, Lilja Soland C. Mike Gip, MD, have reviewed the above documentation for accuracy and completeness, and  I agree with the above.

## 2019-08-20 NOTE — Telephone Encounter (Signed)
Spoke with the patient to see what steroids she allergic to. The patient she is not allergic to any steroids. She was able to provide me with the medication list of her allergy Compazine, Morphine, Doxycycline, and Levofloxacin. The patient was understanding and agreeable.

## 2019-08-20 NOTE — Progress Notes (Signed)
New Outpatient Visit Date: 08/20/2019  Referring Provider: Nolon Stalls, MD [place] [facility]  Chief Complaint: Abnormal echocardiogram  HPI:  Heather Bell is a 43 y.o. female who is being seen today for the evaluation of abnormal echocardiogram at the request of Dr. Mike Gip. She has a history of hyperlipidemia, diabetes mellitus, depression, and recently diagnosed right breast cancer.  She underwent prechemotherapy echocardiogram, with LVEF being read as 50-55% with normal function but global hypokinesis.  Today, Heather Bell reports that she feels well.  She has mild exertional dyspnea that is longstanding.  She attributes this to deconditioning, as she has been fairly sedentary.  She does not have any limitations doing her daily activities.  She denies chest pain, orthopnea, PND, and edema.  She endorses occasional orthostatic lightheadedness.  --------------------------------------------------------------------------------------------------  Cardiovascular History & Procedures: Cardiovascular Problems:  Question cardiomyopathy  Risk Factors:  Diabetes mellitus, hyperlipidemia, and family history  Cath/PCI:  None  CV Surgery:  None  EP Procedures and Devices:  None  Non-Invasive Evaluation(s):  TTE (08/13/2019): Normal LV size and wall thickness.  LVEF 50-55% with global hypokinesis.  Normal RV size and function.  Aortic sclerosis.  Trivial mitral regurgitation.  Recent CV Pertinent Labs: Lab Results  Component Value Date   K 4.1 07/30/2019   K 3.4 (L) 03/29/2014   MG 6.0 (H) 06/09/2012   BUN 12 07/30/2019   BUN 6 (L) 03/29/2014   CREATININE 0.62 07/30/2019   CREATININE 0.85 03/29/2014    --------------------------------------------------------------------------------------------------  Past Medical History:  Diagnosis Date  . Allergic rhinitis   . Anxiety   . Bipolar disorder ([place])    depression  . Cancer [facility])    breast right  .  Depression   . Diabetes ([place])   . GERD (gastroesophageal reflux disease)   . Hyperlipidemia     Past Surgical History:  Procedure Laterality Date  . BREAST BIOPSY Right 07/22/2019   mass bx, path pending, heart marker  . BREAST BIOPSY Right 07/22/2019   LN bx, path pending,  butterfly hydromarker  . IMAGE GUIDED SINUS SURGERY    . KNEE SURGERY    . NASAL SEPTUM SURGERY    . PORTACATH PLACEMENT N/A 08/07/2019   Procedure: INSERTION PORT-A-CATH;  Surgeon: Herbert Pun, MD;  Location: [facility] ORS;  Service: General;  Laterality: N/A;  . TONSILLECTOMY      Current Meds  Medication Sig  . amphetamine-dextroamphetamine (ADDERALL XR) 25 MG 24 hr capsule Take 25 mg by mouth daily.  . cetirizine (ZYRTEC) 10 MG tablet Take 10 mg by mouth daily.   . clonazePAM (KLONOPIN) 1 MG tablet Take 1 mg by mouth 3 (three) times daily as needed for anxiety.  Marland Kitchen doxepin (SINEQUAN) 25 MG capsule Take 25 mg by mouth at bedtime.   Marland Kitchen ibuprofen (ADVIL) 200 MG tablet Take 400-800 mg by mouth every 6 (six) hours as needed for headache or moderate pain.  Marland Kitchen lamoTRIgine (LAMICTAL) 200 MG tablet Take 200 mg by mouth at bedtime.   . lidocaine-prilocaine (EMLA) cream Apply topically over port-a-cath 30 minutes to 1 hour prior to treatment.  Marland Kitchen LORazepam (ATIVAN) 0.5 MG tablet Take 1 tablet (0.5 mg total) by mouth every 6 (six) hours as needed (Nausea or vomiting).  Marland Kitchen lurasidone (LATUDA) 40 MG TABS tablet Take 40 mg by mouth at bedtime. Take with 60 mg to equal 100 mg at night  . Lurasidone HCl 60 MG TABS Take 60 mg by mouth at bedtime. Take with 40 mg  to equal 100 mg at night  . metFORMIN (GLUCOPHAGE-XR) 500 MG 24 hr tablet Take 1,000 mg by mouth 2 (two) times daily.  . mometasone (NASONEX) 50 MCG/ACT nasal spray Place 2 sprays into the nose daily as needed (allergies).   . Multiple Vitamin (MULTIVITAMIN WITH MINERALS) TABS tablet Take 1 tablet by mouth daily.  Marland Kitchen omeprazole (PRILOSEC) 20 MG capsule Take 20 mg by  mouth daily.   . ondansetron (ZOFRAN) 8 MG tablet Take 1 tablet (8 mg total) by mouth 2 (two) times daily as needed for refractory nausea / vomiting. Start on day 3 after carboplatin chemo.  Marland Kitchen QUEtiapine (SEROQUEL XR) 300 MG 24 hr tablet Take 300 mg by mouth at bedtime.  . simvastatin (ZOCOR) 20 MG tablet Take 20 mg by mouth daily.  . temazepam (RESTORIL) 15 MG capsule Take 15 mg by mouth at bedtime as needed for sleep.  Marland Kitchen topiramate (TOPAMAX) 100 MG tablet Take 50-100 mg by mouth See admin instructions. Take 100 mg in the morning and 50 mg at night    Allergies: Compazine [prochlorperazine edisylate], Morphine and related, Doxycycline, Fluticasone, and Levofloxacin  Social History   Tobacco Use  . Smoking status: Never Smoker  . Smokeless tobacco: Never Used  Substance Use Topics  . Alcohol use: No    Alcohol/week: 0.0 standard drinks  . Drug use: No    Family History  Problem Relation Age of Onset  . Heart attack Mother 57       s/p CABG x 5  . Lung cancer Father   . Lung cancer Paternal Uncle   . Heart disease Paternal Uncle     Review of Systems: A 12-system review of systems was performed and was negative except as noted in the HPI.  --------------------------------------------------------------------------------------------------  Physical Exam: BP 98/60 (BP Location: Right Arm, Patient Position: Sitting, Cuff Size: Normal)   Pulse 97   Ht 5\' 6"  (1.676 m)   Wt 137 lb (62.1 kg)   SpO2 98%   BMI 22.11 kg/m   General: NAD. HEENT: No conjunctival pallor or scleral icterus. Facemask in place. Neck: Supple without lymphadenopathy, thyromegaly, JVD, or HJR. No carotid bruit. Lungs: Normal work of breathing. Clear to auscultation bilaterally without wheezes or crackles. Heart: Regular rate and rhythm without murmurs, rubs, or gallops. Non-displaced PMI. Abd: Bowel sounds present. Soft, NT/ND without hepatosplenomegaly Ext: No lower extremity edema. Radial, PT, and DP  pulses are 2+ bilaterally Skin: Warm and dry without rash. Neuro: CNIII-XII intact. Strength and fine-touch sensation intact in upper and lower extremities bilaterally. Psych: Normal mood and affect.  EKG: Normal sinus rhythm with low voltage.  No significant abnormality.  Lab Results  Component Value Date   WBC 8.6 07/30/2019   HGB 11.3 (L) 07/30/2019   HCT 35.0 (L) 07/30/2019   MCV 84.5 07/30/2019   PLT 384 07/30/2019    Lab Results  Component Value Date   NA 138 07/30/2019   K 4.1 07/30/2019   CL 109 07/30/2019   CO2 25 07/30/2019   BUN 12 07/30/2019   CREATININE 0.62 07/30/2019   GLUCOSE 96 07/30/2019   ALT 9 07/30/2019    No results found for: CHOL, HDL, LDLCALC, LDLDIRECT, TRIG, CHOLHDL   --------------------------------------------------------------------------------------------------  ASSESSMENT AND PLAN: Cardiomyopathy: I have personally reviewed her echocardiogram performed last week.  The images are suboptimal due to chest wall interference.  I believe her LVEF is probably low normal (50-55%) as indicated in the report.  Given that she is scheduled  to begin chemotherapy for right breast cancer next week (including potentially cardiotoxic agents), I have recommended that we perform a MUGA scan on Monday to better assess her LVEF.  Additionally, this should be more reproducible to trend her systolic function in the future.  If LVEF is confirmed to be normal, I would not recommend any further intervention from a cardiac standpoint and proceeding with chemotherapy as planned.  If LVEF is reduced, initiation of evidence-based heart failure therapy and ischemia evaluation will need to be considered.  Follow-up: Return to clinic in 6 months.  Nelva Bush, MD 08/20/2019 3:38 PM

## 2019-08-20 NOTE — Patient Instructions (Addendum)
Medication Instructions:  Your physician recommends that you continue on your current medications as directed. Please refer to the Current Medication list given to you today.  *If you need a refill on your cardiac medications before your next appointment, please call your pharmacy*  Lab Work: none If you have labs (blood work) drawn today and your tests are completely normal, you will receive your results only by: Marland Kitchen MyChart Message (if you have MyChart) OR . A paper copy in the mail If you have any lab test that is abnormal or we need to change your treatment, we will call you to review the results.  Testing/Procedures: Your physician has requested that you have a MUGA: A multigated acquisition (MUGA) scan is a test that looks at the chambers and blood vessels of the heart. Please see the CareNotes handout/brochure given to you today for further information. Insurance  Josem Kaufmann # [id number] exp 09/18/19  LOCATION:  ____[facility] ____________ DATE/TIME:  _____01/11/2019 at 10 am, ARRIVE at 9:30 am_____   Follow-Up: At [facility], you and your health needs are our priority.  As part of our continuing mission to provide you with exceptional heart care, we have created designated Provider Care Teams.  These Care Teams include your primary Cardiologist (physician) and Advanced Practice Providers (APPs -  Physician Assistants and Nurse Practitioners) who all work together to provide you with the care you need, when you need it.  Your next appointment:   6 month(s)  The format for your next appointment:   In Person  Provider:    You may see DR Harrell Gave END or one of the following Advanced Practice Providers on your designated Care Team:    Murray Hodgkins, NP  Christell Faith, PA-C  Marrianne Mood, PA-C

## 2019-08-20 NOTE — Telephone Encounter (Signed)
Spoke with the patient to inform her that she has been approved for the Emla cream. The patient was understanding and agreeable.

## 2019-08-21 ENCOUNTER — Encounter: Payer: Self-pay | Admitting: Internal Medicine

## 2019-08-24 ENCOUNTER — Encounter
Admission: RE | Admit: 2019-08-24 | Discharge: 2019-08-24 | Disposition: A | Discharge status: Home / Self Care | Payer: BC Managed Care – PPO | Source: Ambulatory Visit | Attending: Internal Medicine | Admitting: Internal Medicine

## 2019-08-24 ENCOUNTER — Other Ambulatory Visit: Payer: Self-pay

## 2019-08-24 DIAGNOSIS — I429 Cardiomyopathy, unspecified: Secondary | ICD-10-CM | POA: Diagnosis present

## 2019-08-24 MED ORDER — TECHNETIUM TC 99M-LABELED RED BLOOD CELLS IV KIT
21.1700 | PACK | Freq: Once | INTRAVENOUS | Status: AC | PRN
  Administered 2019-08-24: 11:00:00 21.17 via INTRAVENOUS

## 2019-08-25 ENCOUNTER — Encounter: Payer: Self-pay | Admitting: Hematology and Oncology

## 2019-08-25 ENCOUNTER — Inpatient Hospital Stay: Payer: BC Managed Care – PPO

## 2019-08-25 ENCOUNTER — Inpatient Hospital Stay: Payer: BC Managed Care – PPO | Attending: Hematology and Oncology | Admitting: Hematology and Oncology

## 2019-08-25 VITALS — BP 111/71 | HR 101 | Resp 18

## 2019-08-25 VITALS — BP 99/61 | HR 99 | Temp 97.4°F | Resp 18 | Ht 66.0 in | Wt 136.8 lb

## 2019-08-25 DIAGNOSIS — Z791 Long term (current) use of non-steroidal anti-inflammatories (NSAID): Secondary | ICD-10-CM | POA: Insufficient documentation

## 2019-08-25 DIAGNOSIS — Z171 Estrogen receptor negative status [ER-]: Secondary | ICD-10-CM | POA: Diagnosis not present

## 2019-08-25 DIAGNOSIS — Z79899 Other long term (current) drug therapy: Secondary | ICD-10-CM | POA: Insufficient documentation

## 2019-08-25 DIAGNOSIS — R197 Diarrhea, unspecified: Secondary | ICD-10-CM | POA: Diagnosis not present

## 2019-08-25 DIAGNOSIS — M25512 Pain in left shoulder: Secondary | ICD-10-CM | POA: Diagnosis not present

## 2019-08-25 DIAGNOSIS — E785 Hyperlipidemia, unspecified: Secondary | ICD-10-CM | POA: Diagnosis not present

## 2019-08-25 DIAGNOSIS — Z7984 Long term (current) use of oral hypoglycemic drugs: Secondary | ICD-10-CM | POA: Insufficient documentation

## 2019-08-25 DIAGNOSIS — C50911 Malignant neoplasm of unspecified site of right female breast: Secondary | ICD-10-CM | POA: Diagnosis present

## 2019-08-25 DIAGNOSIS — K5900 Constipation, unspecified: Secondary | ICD-10-CM | POA: Insufficient documentation

## 2019-08-25 DIAGNOSIS — Z5111 Encounter for antineoplastic chemotherapy: Secondary | ICD-10-CM

## 2019-08-25 DIAGNOSIS — F319 Bipolar disorder, unspecified: Secondary | ICD-10-CM | POA: Insufficient documentation

## 2019-08-25 DIAGNOSIS — K219 Gastro-esophageal reflux disease without esophagitis: Secondary | ICD-10-CM | POA: Insufficient documentation

## 2019-08-25 DIAGNOSIS — F418 Other specified anxiety disorders: Secondary | ICD-10-CM | POA: Diagnosis not present

## 2019-08-25 DIAGNOSIS — C50919 Malignant neoplasm of unspecified site of unspecified female breast: Secondary | ICD-10-CM | POA: Diagnosis not present

## 2019-08-25 DIAGNOSIS — D649 Anemia, unspecified: Secondary | ICD-10-CM | POA: Diagnosis not present

## 2019-08-25 DIAGNOSIS — E119 Type 2 diabetes mellitus without complications: Secondary | ICD-10-CM | POA: Diagnosis not present

## 2019-08-25 DIAGNOSIS — E876 Hypokalemia: Secondary | ICD-10-CM | POA: Diagnosis not present

## 2019-08-25 DIAGNOSIS — R110 Nausea: Secondary | ICD-10-CM | POA: Insufficient documentation

## 2019-08-25 DIAGNOSIS — R0609 Other forms of dyspnea: Secondary | ICD-10-CM | POA: Insufficient documentation

## 2019-08-25 DIAGNOSIS — Z3202 Encounter for pregnancy test, result negative: Secondary | ICD-10-CM | POA: Insufficient documentation

## 2019-08-25 DIAGNOSIS — Z7951 Long term (current) use of inhaled steroids: Secondary | ICD-10-CM | POA: Diagnosis not present

## 2019-08-25 DIAGNOSIS — G8918 Other acute postprocedural pain: Secondary | ICD-10-CM | POA: Insufficient documentation

## 2019-08-25 LAB — COMPREHENSIVE METABOLIC PANEL
ALT: 10 U/L (ref 0–44)
AST: 16 U/L (ref 15–41)
Albumin: 4.2 g/dL (ref 3.5–5.0)
Alkaline Phosphatase: 54 U/L (ref 38–126)
Anion gap: 8 (ref 5–15)
BUN: 16 mg/dL (ref 6–20)
CO2: 22 mmol/L (ref 22–32)
Calcium: 9.3 mg/dL (ref 8.9–10.3)
Chloride: 108 mmol/L (ref 98–111)
Creatinine, Ser: 0.74 mg/dL (ref 0.44–1.00)
GFR calc Af Amer: >60 mL/min (ref >60)
GFR calc non Af Amer: >60 mL/min (ref >60)
Glucose, Bld: 107 mg/dL — ABNORMAL HIGH (ref 70–99)
Potassium: 3.7 mmol/L (ref 3.5–5.1)
Sodium: 138 mmol/L (ref 135–145)
Total Bilirubin: 0.5 mg/dL (ref 0.3–1.2)
Total Protein: 7.6 g/dL (ref 6.5–8.1)

## 2019-08-25 LAB — CBC WITH DIFFERENTIAL/PLATELET
Abs Immature Granulocytes: 0.02 10*3/uL (ref 0.00–0.07)
Basophils Absolute: 0.1 10*3/uL (ref 0.0–0.1)
Basophils Relative: 1 %
Eosinophils Absolute: 0.2 10*3/uL (ref 0.0–0.5)
Eosinophils Relative: 2 %
HCT: 35.4 % — ABNORMAL LOW (ref 36.0–46.0)
Hemoglobin: 11.3 g/dL — ABNORMAL LOW (ref 12.0–15.0)
Immature Granulocytes: 0 %
Lymphocytes Relative: 29 %
Lymphs Abs: 2.2 10*3/uL (ref 0.7–4.0)
MCH: 26.9 pg (ref 26.0–34.0)
MCHC: 31.9 g/dL (ref 30.0–36.0)
MCV: 84.3 fL (ref 80.0–100.0)
Monocytes Absolute: 0.5 10*3/uL (ref 0.1–1.0)
Monocytes Relative: 7 %
Neutro Abs: 4.4 10*3/uL (ref 1.7–7.7)
Neutrophils Relative %: 61 %
Platelets: 331 10*3/uL (ref 150–400)
RBC: 4.2 MIL/uL (ref 3.87–5.11)
RDW: 13.5 % (ref 11.5–15.5)
WBC: 7.3 10*3/uL (ref 4.0–10.5)
nRBC: 0 % (ref 0.0–0.2)

## 2019-08-25 LAB — MAGNESIUM: Magnesium: 2 mg/dL (ref 1.7–2.4)

## 2019-08-25 LAB — PREGNANCY, URINE: Preg Test, Ur: NEGATIVE

## 2019-08-25 MED ORDER — SODIUM CHLORIDE 0.9 % IV SOLN: 10.0000 mg | Freq: Once | INTRAVENOUS | Status: DC

## 2019-08-25 MED ORDER — FAMOTIDINE IN NACL 20-0.9 MG/50ML-% IV SOLN
20.0000 mg | Freq: Once | INTRAVENOUS | Status: AC
  Administered 2019-08-25: 20 mg via INTRAVENOUS
  Filled 2019-08-25: qty 50

## 2019-08-25 MED ORDER — DEXAMETHASONE SODIUM PHOSPHATE 10 MG/ML IJ SOLN: 10.0000 mg | Freq: Once | INTRAMUSCULAR | Status: DC

## 2019-08-25 MED ORDER — SODIUM CHLORIDE 0.9 % IV SOLN
80.0000 mg/m2 | Freq: Once | INTRAVENOUS | Status: AC
  Administered 2019-08-25: 132 mg via INTRAVENOUS
  Filled 2019-08-25: qty 22

## 2019-08-25 MED ORDER — SODIUM CHLORIDE 0.9 % IV SOLN
Freq: Once | INTRAVENOUS | Status: AC
  Filled 2019-08-25: qty 250

## 2019-08-25 MED ORDER — DIPHENHYDRAMINE HCL 50 MG/ML IJ SOLN
50.0000 mg | Freq: Once | INTRAMUSCULAR | Status: AC
  Administered 2019-08-25: 50 mg via INTRAVENOUS
  Filled 2019-08-25: qty 1

## 2019-08-25 MED ORDER — SODIUM CHLORIDE 0.9% FLUSH
10.0000 mL | INTRAVENOUS | Status: DC | PRN
  Filled 2019-08-25: qty 10

## 2019-08-25 MED ORDER — SODIUM CHLORIDE 0.9 % IV SOLN
221.6000 mg | Freq: Once | INTRAVENOUS | Status: AC
  Administered 2019-08-25: 220 mg via INTRAVENOUS
  Filled 2019-08-25: qty 22

## 2019-08-25 MED ORDER — HEPARIN SOD (PORK) LOCK FLUSH 100 UNIT/ML IV SOLN
500.0000 [IU] | Freq: Once | INTRAVENOUS | Status: AC
  Administered 2019-08-25: 500 [IU] via INTRAVENOUS
  Filled 2019-08-25: qty 5

## 2019-08-25 MED ORDER — SODIUM CHLORIDE 0.9 % IV SOLN
10.0000 mg | Freq: Once | INTRAVENOUS | Status: AC
  Administered 2019-08-25: 10 mg via INTRAVENOUS
  Filled 2019-08-25: qty 1

## 2019-08-25 MED ORDER — PALONOSETRON HCL INJECTION 0.25 MG/5ML
0.2500 mg | Freq: Once | INTRAVENOUS | Status: AC
  Administered 2019-08-25: 0.25 mg via INTRAVENOUS
  Filled 2019-08-25: qty 5

## 2019-08-25 NOTE — Patient Instructions (Signed)
Carboplatin injection What is this medicine? CARBOPLATIN (KAR boe pla tin) is a chemotherapy drug. It targets fast dividing cells, like cancer cells, and causes these cells to die. This medicine is used to treat ovarian cancer and many other cancers. This medicine may be used for other purposes; ask your health care provider or pharmacist if you have questions. COMMON BRAND NAME(S): Paraplatin What should I tell my health care provider before I take this medicine? They need to know if you have any of these conditions:  blood disorders  hearing problems  kidney disease  recent or ongoing radiation therapy  an unusual or allergic reaction to carboplatin, cisplatin, other chemotherapy, other medicines, foods, dyes, or preservatives  pregnant or trying to get pregnant  breast-feeding How should I use this medicine? This drug is usually given as an infusion into a vein. It is administered in a hospital or clinic by a specially trained health care professional. Talk to your pediatrician regarding the use of this medicine in children. Special care may be needed. Overdosage: If you think you have taken too much of this medicine contact a poison control center or emergency room at once. NOTE: This medicine is only for you. Do not share this medicine with others. What if I miss a dose? It is important not to miss a dose. Call your doctor or health care professional if you are unable to keep an appointment. What may interact with this medicine?  medicines for seizures  medicines to increase blood counts like filgrastim, pegfilgrastim, sargramostim  some antibiotics like amikacin, gentamicin, neomycin, streptomycin, tobramycin  vaccines Talk to your doctor or health care professional before taking any of these medicines:  acetaminophen  aspirin  ibuprofen  ketoprofen  naproxen This list may not describe all possible interactions. Give your health care provider a list of all the  medicines, herbs, non-prescription drugs, or dietary supplements you use. Also tell them if you smoke, drink alcohol, or use illegal drugs. Some items may interact with your medicine. What should I watch for while using this medicine? Your condition will be monitored carefully while you are receiving this medicine. You will need important blood work done while you are taking this medicine. This drug may make you feel generally unwell. This is not uncommon, as chemotherapy can affect healthy cells as well as cancer cells. Report any side effects. Continue your course of treatment even though you feel ill unless your doctor tells you to stop. In some cases, you may be given additional medicines to help with side effects. Follow all directions for their use. Call your doctor or health care professional for advice if you get a fever, chills or sore throat, or other symptoms of a cold or flu. Do not treat yourself. This drug decreases your body's ability to fight infections. Try to avoid being around people who are sick. This medicine may increase your risk to bruise or bleed. Call your doctor or health care professional if you notice any unusual bleeding. Be careful brushing and flossing your teeth or using a toothpick because you may get an infection or bleed more easily. If you have any dental work done, tell your dentist you are receiving this medicine. Avoid taking products that contain aspirin, acetaminophen, ibuprofen, naproxen, or ketoprofen unless instructed by your doctor. These medicines may hide a fever. Do not become pregnant while taking this medicine. Women should inform their doctor if they wish to become pregnant or think they might be pregnant. There is a   potential for serious side effects to an unborn child. Talk to your health care professional or pharmacist for more information. Do not breast-feed an infant while taking this medicine. What side effects may I notice from receiving this  medicine? Side effects that you should report to your doctor or health care professional as soon as possible:  allergic reactions like skin rash, itching or hives, swelling of the face, lips, or tongue  signs of infection - fever or chills, cough, sore throat, pain or difficulty passing urine  signs of decreased platelets or bleeding - bruising, pinpoint red spots on the skin, black, tarry stools, nosebleeds  signs of decreased red blood cells - unusually weak or tired, fainting spells, lightheadedness  breathing problems  changes in hearing  changes in vision  chest pain  high blood pressure  low blood counts - This drug may decrease the number of white blood cells, red blood cells and platelets. You may be at increased risk for infections and bleeding.  nausea and vomiting  pain, swelling, redness or irritation at the injection site  pain, tingling, numbness in the hands or feet  problems with balance, talking, walking  trouble passing urine or change in the amount of urine Side effects that usually do not require medical attention (report to your doctor or health care professional if they continue or are bothersome):  hair loss  loss of appetite  metallic taste in the mouth or changes in taste This list may not describe all possible side effects. Call your doctor for medical advice about side effects. You may report side effects to FDA at 1-800-FDA-1088. Where should I keep my medicine? This drug is given in a hospital or clinic and will not be stored at home. NOTE: This sheet is a summary. It may not cover all possible information. If you have questions about this medicine, talk to your doctor, pharmacist, or health care provider.  2020 Elsevier/Gold Standard (2007-11-11 14:38:05)  Paclitaxel injection What is this medicine? PACLITAXEL (PAK li TAX el) is a chemotherapy drug. It targets fast dividing cells, like cancer cells, and causes these cells to die. This  medicine is used to treat ovarian cancer, breast cancer, lung cancer, Kaposi's sarcoma, and other cancers. This medicine may be used for other purposes; ask your health care provider or pharmacist if you have questions. COMMON BRAND NAME(S): Onxol, Taxol What should I tell my health care provider before I take this medicine? They need to know if you have any of these conditions:  history of irregular heartbeat  liver disease  low blood counts, like low white cell, platelet, or red cell counts  lung or breathing disease, like asthma  tingling of the fingers or toes, or other nerve disorder  an unusual or allergic reaction to paclitaxel, alcohol, polyoxyethylated castor oil, other chemotherapy, other medicines, foods, dyes, or preservatives  pregnant or trying to get pregnant  breast-feeding How should I use this medicine? This drug is given as an infusion into a vein. It is administered in a hospital or clinic by a specially trained health care professional. Talk to your pediatrician regarding the use of this medicine in children. Special care may be needed. Overdosage: If you think you have taken too much of this medicine contact a poison control center or emergency room at once. NOTE: This medicine is only for you. Do not share this medicine with others. What if I miss a dose? It is important not to miss your dose. Call your doctor   or health care professional if you are unable to keep an appointment. What may interact with this medicine? Do not take this medicine with any of the following medications:  disulfiram  metronidazole This medicine may also interact with the following medications:  antiviral medicines for hepatitis, HIV or AIDS  certain antibiotics like erythromycin and clarithromycin  certain medicines for fungal infections like ketoconazole and itraconazole  certain medicines for seizures like carbamazepine, phenobarbital,  phenytoin  gemfibrozil  nefazodone  rifampin  St. John's wort This list may not describe all possible interactions. Give your health care provider a list of all the medicines, herbs, non-prescription drugs, or dietary supplements you use. Also tell them if you smoke, drink alcohol, or use illegal drugs. Some items may interact with your medicine. What should I watch for while using this medicine? Your condition will be monitored carefully while you are receiving this medicine. You will need important blood work done while you are taking this medicine. This medicine can cause serious allergic reactions. To reduce your risk you will need to take other medicine(s) before treatment with this medicine. If you experience allergic reactions like skin rash, itching or hives, swelling of the face, lips, or tongue, tell your doctor or health care professional right away. In some cases, you may be given additional medicines to help with side effects. Follow all directions for their use. This drug may make you feel generally unwell. This is not uncommon, as chemotherapy can affect healthy cells as well as cancer cells. Report any side effects. Continue your course of treatment even though you feel ill unless your doctor tells you to stop. Call your doctor or health care professional for advice if you get a fever, chills or sore throat, or other symptoms of a cold or flu. Do not treat yourself. This drug decreases your body's ability to fight infections. Try to avoid being around people who are sick. This medicine may increase your risk to bruise or bleed. Call your doctor or health care professional if you notice any unusual bleeding. Be careful brushing and flossing your teeth or using a toothpick because you may get an infection or bleed more easily. If you have any dental work done, tell your dentist you are receiving this medicine. Avoid taking products that contain aspirin, acetaminophen, ibuprofen,  naproxen, or ketoprofen unless instructed by your doctor. These medicines may hide a fever. Do not become pregnant while taking this medicine. Women should inform their doctor if they wish to become pregnant or think they might be pregnant. There is a potential for serious side effects to an unborn child. Talk to your health care professional or pharmacist for more information. Do not breast-feed an infant while taking this medicine. Men are advised not to father a child while receiving this medicine. This product may contain alcohol. Ask your pharmacist or healthcare provider if this medicine contains alcohol. Be sure to tell all healthcare providers you are taking this medicine. Certain medicines, like metronidazole and disulfiram, can cause an unpleasant reaction when taken with alcohol. The reaction includes flushing, headache, nausea, vomiting, sweating, and increased thirst. The reaction can last from 30 minutes to several hours. What side effects may I notice from receiving this medicine? Side effects that you should report to your doctor or health care professional as soon as possible:  allergic reactions like skin rash, itching or hives, swelling of the face, lips, or tongue  breathing problems  changes in vision  fast, irregular heartbeat  high   or low blood pressure  mouth sores  pain, tingling, numbness in the hands or feet  signs of decreased platelets or bleeding - bruising, pinpoint red spots on the skin, black, tarry stools, blood in the urine  signs of decreased red blood cells - unusually weak or tired, feeling faint or lightheaded, falls  signs of infection - fever or chills, cough, sore throat, pain or difficulty passing urine  signs and symptoms of liver injury like dark yellow or brown urine; general ill feeling or flu-like symptoms; light-colored stools; loss of appetite; nausea; right upper belly pain; unusually weak or tired; yellowing of the eyes or  skin  swelling of the ankles, feet, hands  unusually slow heartbeat Side effects that usually do not require medical attention (report to your doctor or health care professional if they continue or are bothersome):  diarrhea  hair loss  loss of appetite  muscle or joint pain  nausea, vomiting  pain, redness, or irritation at site where injected  tiredness This list may not describe all possible side effects. Call your doctor for medical advice about side effects. You may report side effects to FDA at 1-800-FDA-1088. Where should I keep my medicine? This drug is given in a hospital or clinic and will not be stored at home. NOTE: This sheet is a summary. It may not cover all possible information. If you have questions about this medicine, talk to your doctor, pharmacist, or health care provider.  2020 Elsevier/Gold Standard (2017-04-09 13:14:55)  

## 2019-08-25 NOTE — Progress Notes (Signed)
The patient c/o pain noted to left shoulder after she has had a port placement. No pain sitting still but with movement.

## 2019-08-25 NOTE — Progress Notes (Signed)
Patient here today for her initial treatment with Taxol and Carboplatin.  New port accessed without difficulty.  Patient educated to apply Emla cream above the port site since it slides down with movement.  All chemotherapy medications reviewed with patient and consent obtained.  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  I consulted with MD and pharmacists to see if we could replace the pepcid in the regimen.  No replacements found.  So, I will suggest giving the premeds in the order for the next treatment:  Aloxi, Benadryl, Decadron, and then Pepcid.  Hopefully, she will not have nausea with this order of medications.  Patient treated with first round of Taxol and Carbo and did well with the infusions.  No complaints of itching, trouble swallowing or pain.  Patient did get "dizzy" from the Benadryl and that side effect wore off after a few minutes per patient.  Patient discharged home without complaints.

## 2019-08-26 ENCOUNTER — Telehealth: Payer: Self-pay

## 2019-08-26 NOTE — Telephone Encounter (Signed)
Follow up call after receiving first chemo yesterday.  Pateint states infusion went well and is feeling great today.  Eating and drinking plenty of fluids.  Denies any nausea.  Encouraged patient to call for any questions or concerns.

## 2019-08-27 NOTE — Progress Notes (Signed)
[facility]  [address] [place], [place] [address] Phone: [phone]  Fax: [phone]   Clinic Day:  09/01/2019  Referring physician: [facility] [place]  Chief Complaint: Heather Bell is a 44 y.o. female with  clinical stage IIBtriple negativerightbreast cancer who is seen for assessment prior to week #2 of Taxol + carboplatin.  HPI: The patient was last seen in the medical oncology clinic on 08/25/2019. At that time, she felt "ok".  She noted some limited ROM to her left arm.  Hematocrit was 35.4, hemoglobin 11.3, platelets 331,000, WBC 7,300. CMP was normal.  She received week #1 Taxol and carboplatin.  The patient reported to Dr. Tasia Catchings on 08/29/2019 that she felt like her ovaries were "swollen". She had abdominal pain for 1 day after her first chemotherapy treatment. She felt a "knot at the area where her ovary was supposed to be" and was concerned. The "knot" was not painful.  Pain level was 2/10.  She was using Mirlax for constipation. She had no nausea or vomiting. Dr. Tasia Catchings recommended OTC stool softeners and evaluation.   During the interim, she has felt "so so".  She reports abdominal pain and nausea that started on Wednesday (08/26/2019) with relief on Sunday (08/30/2019). She started having diarrhea today after taking Miralax x 1 week. She stopped Miralax on Sunday because she finally had a bowel movement. She was constipated prior to taking Miralax.  She is now taking imodium. She took both of her prescribed nausea mediations. She had 3 bowel movements this morning.   She reports some mild cramping in left and right calfs and arch of right foot yesterday; cramps come and go.  Her left leg cramp has been off and on for 2 days. She denies any erythema or swelling.  She notes not being active at home; they are not an outside type of family.  She requested muscle relaxants.  I encouraged the patient to be more active and try a heating pad.    She reports that the lump in right breast is bigger; she noticed this yesterday.  She also notes some breast tenderness.  She denies any erythema or increased warmth.  Her BP is 86/54 in the clinic today. She describes dizziness/lightheadedness on standing.  She notes that she does not drink a lot of fluids. She likes to drink sweet tea and recently bought some Gatorade. She notes that she did not get any sleep last night.    Past Medical History:  Diagnosis Date   Allergic rhinitis    Anxiety    Bipolar disorder ([place])    depression   Cancer ([place])    breast right   Depression    Diabetes ([place])    GERD (gastroesophageal reflux disease)    Hyperlipidemia     Past Surgical History:  Procedure Laterality Date   BREAST BIOPSY Right 07/22/2019   mass bx, path pending, heart marker   BREAST BIOPSY Right 07/22/2019   LN bx, path pending,  butterfly hydromarker   IMAGE GUIDED SINUS SURGERY     KNEE SURGERY     NASAL SEPTUM SURGERY     PORTACATH PLACEMENT N/A 08/07/2019   Procedure: INSERTION PORT-A-CATH;  Surgeon: Herbert Pun, MD;  Location: [facility] ORS;  Service: General;  Laterality: N/A;   TONSILLECTOMY      Family History  Problem Relation Age of Onset   Heart attack Mother 76       s/p CABG x 5  Lung cancer Father    Lung cancer Paternal Uncle    Heart disease Paternal Uncle     Social History:  reports that she has never smoked. She has never used smokeless tobacco. She reports that she does not drink alcohol or use drugs. She denies any exposure to radiation or toxins.She has 2 children (59 year old daughter and 61 year old son).She use to work inthefast foodindustry. She is currently not working. Her husbandisMichael. [name] travels a lot for his job. The patient is alone today.  Allergies:  Allergies  Allergen Reactions   Compazine [Prochlorperazine Edisylate] Other (See Comments)    Acute dystonic reaction   Morphine And  Related Shortness Of Breath   Doxycycline Other (See Comments)    "made all my symptoms worse"    Fluticasone Other (See Comments)    Doesn't work   Levofloxacin Other (See Comments)    "made all my symptoms worse"     Current Medications: Current Outpatient Medications  Medication Sig Dispense Refill   amphetamine-dextroamphetamine (ADDERALL XR) 25 MG 24 hr capsule Take 25 mg by mouth daily.     cetirizine (ZYRTEC) 10 MG tablet Take 10 mg by mouth daily.      clonazePAM (KLONOPIN) 1 MG tablet Take 1 mg by mouth 3 (three) times daily as needed for anxiety.     doxepin (SINEQUAN) 25 MG capsule Take 25 mg by mouth at bedtime.      ibuprofen (ADVIL) 200 MG tablet Take 400-800 mg by mouth every 6 (six) hours as needed for headache or moderate pain.     lamoTRIgine (LAMICTAL) 200 MG tablet Take 200 mg by mouth at bedtime.      lidocaine-prilocaine (EMLA) cream Apply topically over port-a-cath 30 minutes to 1 hour prior to treatment. 30 g 0   LORazepam (ATIVAN) 0.5 MG tablet Take 1 tablet (0.5 mg total) by mouth every 6 (six) hours as needed (Nausea or vomiting). 20 tablet 0   lurasidone (LATUDA) 40 MG TABS tablet Take 40 mg by mouth at bedtime. Take with 60 mg to equal 100 mg at night     Lurasidone HCl 60 MG TABS Take 60 mg by mouth at bedtime. Take with 40 mg to equal 100 mg at night     metFORMIN (GLUCOPHAGE-XR) 500 MG 24 hr tablet Take 1,000 mg by mouth 2 (two) times daily.     mometasone (NASONEX) 50 MCG/ACT nasal spray Place 2 sprays into the nose daily as needed (allergies).      Multiple Vitamin (MULTIVITAMIN WITH MINERALS) TABS tablet Take 1 tablet by mouth daily.     omeprazole (PRILOSEC) 20 MG capsule Take 20 mg by mouth daily.      QUEtiapine (SEROQUEL XR) 300 MG 24 hr tablet Take 300 mg by mouth at bedtime.     simvastatin (ZOCOR) 20 MG tablet Take 20 mg by mouth daily.     temazepam (RESTORIL) 15 MG capsule Take 15 mg by mouth at bedtime as needed for sleep.      topiramate (TOPAMAX) 100 MG tablet Take 50-100 mg by mouth See admin instructions. Take 100 mg in the morning and 50 mg at night     ondansetron (ZOFRAN) 8 MG tablet Take 1 tablet (8 mg total) by mouth 2 (two) times daily as needed for refractory nausea / vomiting. Start on day 3 after carboplatin chemo. (Patient not taking: Reported on 08/25/2019) 30 tablet 1   No current facility-administered medications for this visit.  Facility-Administered Medications Ordered in Other Visits  Medication Dose Route Frequency Provider Last Rate Last Admin   heparin lock flush 100 unit/mL  500 Units Intravenous Once Colie Josten C, MD       sodium chloride flush (NS) 0.9 % injection 10 mL  10 mL Intravenous PRN Lequita Asal, MD   10 mL at 09/01/19 7096    Review of Systems  Constitutional: Negative.  Negative for chills, diaphoresis, fever, malaise/fatigue and weight loss (up 2 pounds).       Feels "so so".  HENT: Negative.  Negative for congestion, ear pain, hearing loss, nosebleeds, sinus pain and sore throat.   Eyes: Negative.  Negative for blurred vision and double vision.  Respiratory: Negative.  Negative for cough, hemoptysis, sputum production and shortness of breath.   Cardiovascular: Positive for chest pain (right breast lump larger and more tender). Negative for palpitations, orthopnea and leg swelling.  Gastrointestinal: Positive for abdominal pain, diarrhea (after taking Miralax; 3 bowel movements this morning) and nausea. Negative for blood in stool, constipation (initial prior to diarrhea), heartburn, melena and vomiting.       Reflux.  Genitourinary: Negative.  Negative for dysuria, frequency, hematuria and urgency.       Premenopausal.  Musculoskeletal: Positive for joint pain (left shoulder; pain increases with movement) and myalgias (cramping of left and right calf and arch of right foot). Negative for back pain, falls and neck pain.  Skin: Negative.  Negative for  itching and rash.  Neurological: Negative for dizziness (upon standing), tingling, sensory change, speech change, focal weakness, weakness and headaches.  Endo/Heme/Allergies: Bruises/bleeds easily (bruising only).       Type II diabetes.  Psychiatric/Behavioral: Negative for depression and memory loss. The patient has insomnia (last night). The patient is not nervous/anxious.        Bipolar affective disorder.  All other systems reviewed and are negative.  Performance status (ECOG):  1-2  Vitals Blood pressure (!) 86/54, pulse (!) 113, temperature (!) 96.8 F (36 C), temperature source Tympanic, resp. rate 16, weight 138 lb 7.2 oz (62.8 kg).   Physical Exam  Constitutional: She is oriented to person, place, and time. She appears well-developed and well-nourished. No distress.  HENT:  Head: Normocephalic and atraumatic.  Mouth/Throat: Oropharynx is clear and moist. No oropharyngeal exudate.  Short styled brown hair.  Mask.  Eyes: Pupils are equal, round, and reactive to light. Conjunctivae and EOM are normal. No scleral icterus.  Brown eyes.  Cardiovascular: Normal rate, regular rhythm and normal heart sounds.  No murmur heard. Pulmonary/Chest: Effort normal and breath sounds normal. No respiratory distress. She has no wheezes. She has no rales. She exhibits no tenderness.  RIGHT breast mass 6 cm x 5.5 cm.  Abdominal: Soft. Bowel sounds are normal. She exhibits no distension and no mass. There is no abdominal tenderness. There is no rebound and no guarding.  Musculoskeletal:        General: No edema. Normal range of motion.     Cervical back: Normal range of motion and neck supple.  Lymphadenopathy:       Head (right side): No preauricular, no posterior auricular and no occipital adenopathy present.       Head (left side): No preauricular, no posterior auricular and no occipital adenopathy present.    She has no cervical adenopathy.    She has no axillary adenopathy.       Right:  No inguinal and no supraclavicular adenopathy present.  Left: No inguinal and no supraclavicular adenopathy present.  Neurological: She is alert and oriented to person, place, and time.  Skin: Skin is warm and dry. No rash noted. She is not diaphoretic. No erythema. No pallor.  Psychiatric: She has a normal mood and affect. Her behavior is normal. Judgment and thought content normal.  Nursing note and vitals reviewed.   Infusion on 09/01/2019  Component Date Value Ref Range Status   WBC 09/01/2019 6.9  4.0 - 10.5 K/uL Final   RBC 09/01/2019 3.92  3.87 - 5.11 MIL/uL Final   Hemoglobin 09/01/2019 10.6* 12.0 - 15.0 g/dL Final   HCT 09/01/2019 33.1* 36.0 - 46.0 % Final   MCV 09/01/2019 84.4  80.0 - 100.0 fL Final   MCH 09/01/2019 27.0  26.0 - 34.0 pg Final   MCHC 09/01/2019 32.0  30.0 - 36.0 g/dL Final   RDW 09/01/2019 13.4  11.5 - 15.5 % Final   Platelets 09/01/2019 322  150 - 400 K/uL Final   nRBC 09/01/2019 0.0  0.0 - 0.2 % Final   Neutrophils Relative % 09/01/2019 53  % Final   Neutro Abs 09/01/2019 3.7  1.7 - 7.7 K/uL Final   Lymphocytes Relative 09/01/2019 35  % Final   Lymphs Abs 09/01/2019 2.4  0.7 - 4.0 K/uL Final   Monocytes Relative 09/01/2019 6  % Final   Monocytes Absolute 09/01/2019 0.4  0.1 - 1.0 K/uL Final   Eosinophils Relative 09/01/2019 4  % Final   Eosinophils Absolute 09/01/2019 0.3  0.0 - 0.5 K/uL Final   Basophils Relative 09/01/2019 1  % Final   Basophils Absolute 09/01/2019 0.0  0.0 - 0.1 K/uL Final   Immature Granulocytes 09/01/2019 1  % Final   Abs Immature Granulocytes 09/01/2019 0.06  0.00 - 0.07 K/uL Final   Performed at [facility], [address] [place], [place] [address]    Assessment:  Heather Bell is a 44 y.o. female with clinical T1cN1triple negativerightbreast cancers/p ultrasound guided core biopsy. Pathology revealeda grade IIIinvasive mammary carcinoma, no special type of the right breast.  Specimen was 10 mm.Ductal carcinoma in situ(DCIS)was not identified.Right axillarylymph nodewas positive for metastatic carcinoma.Tumor was ER - (<1%), PR - (<1%), and Her2/neu -.  Bilateral mammogram and right unilateral ultrasoundon 07/10/2019 revealed a 1.7 x 1.6 x 1.6 cmmass at the7 o'clock region of the right breast 7 cm from the nipple. There was an abnormal1.4 x 0.7 x 0.9 cmaxillary lymph node. Therewas a second3 mmaxillary lymph node with minimal focal cortical thickening.An ultrasound-guided core biopsies of the mass and the enlarged right axillary lymph node was recommended. BI-RADS category 5.  PET scanon 08/11/2019 showed a hypermetabolic mass in the inferior RIGHT breast consistentwith herbreast carcinoma. There was noevidence hypermetabolic nodal metastasis.There was no distant metastatic disease.There was nopulmonary metastasis or skeletal metastasis.  E[place]oon 12/24/2020revealedan ejection fraction of50 to 55%. The left ventricle demonstrated global hypokinesis.MUGA on 08/24/2019 revealed an EF of 60%.  She is followed by Dr End.  She is s/p week 1 of Taxol + carboplatin (08/25/2019).  She is pre-menopausal.  She stopped birth control pills.  Symptomatically, she has had issues with constipation then diarrhea after taking Miralax.  She has had lower extremity cramping.  Right breast mass is larger.  Plan: 1.   Labs today:  CBC with diff, CMP, Mg. 2.   Clinical stageIIB triple negativeright breast cancer PET scan- no evidence of metastatic disease. Patient undergoing neoadjuvant chemotherapy. Weekly Taxol + carboplatin  x 12 followed by AC x 4 with Neulasta support.  She is s/p week #1 Taxol and carboplatin,   She tolerated chemotherapy fairly well with some nausea controlled by ondansetron and Ativan.  She notes that her breast mass is larger.   Mass was 1.7 cm by ultrasound on  07/10/2019.   Mass s/p biopsy with hematoma was 3.5 cm by exam on 07/30/2019.   Mass today is 5.5- 6 cm.   Suspect hematoma rather than tumor growth.   Chemotherapy just initiated.   Discuss close monitoring.     Review initial plan for follow-up ultrasound after 6 weeks then 12 weeks of Taxol + carboplatin. Review labs.  Proceed with week #2 Taxol and carboplatin.  Follow-up [facility] genetic testing. 3. Left ventricle hypokinesis Echo revealed EF 50-55% with global hypokinesis.  Patient without any symptoms of CHF.  MUGA on 08/24/2019 revealed an EF of 60%.  Patient cleared for chemotherapy by Dr End.  4.Constipation and diarrhea  Discuss history of constipation followed by diarrhea after Miralax.  Patient now on Imodium.  Discuss management of bowels no avoid both constipation and diarrhea. 5.   Lightheaded  Check orthostatics today.  Patient notes poor fluid intake.  Encourage fluids.  NS 500 cc then repeat orthostatics. 6.   RTC in 1 week for MD assessment, labs (CBC with diff, CMP, magnesium) and week #3 of Taxol and carboplatin.  I discussed the assessment and treatment plan with the patient.  The patient was provided an opportunity to ask questions and all were answered.  The patient agreed with the plan and demonstrated an understanding of the instructions.  The patient was advised to call back if the symptoms worsen or if the condition fails to improve as anticipated.   Lequita Asal, MD, PhD    09/01/2019, 10:24 AM  I, Selena Batten, am acting as scribe for [employer]. Mike Gip, MD, PhD.  I, Kaidence Callaway C. Mike Gip, MD, have reviewed the above documentation for accuracy and completeness, and I agree with the above.

## 2019-08-28 ENCOUNTER — Ambulatory Visit: Payer: BC Managed Care – PPO | Attending: Internal Medicine

## 2019-08-28 ENCOUNTER — Telehealth: Payer: Self-pay | Admitting: *Deleted

## 2019-08-28 DIAGNOSIS — Z20822 Contact with and (suspected) exposure to covid-19: Secondary | ICD-10-CM | POA: Insufficient documentation

## 2019-08-28 NOTE — Telephone Encounter (Signed)
Patient called the [facility] this morning to state that she was around someone on Saturday the 2nd of January who has just tested positive for Covid-19.  Patient was given the information on how to call for an appointment for testing for herself.  Patient verbalized understanding.

## 2019-08-29 ENCOUNTER — Telehealth: Payer: Self-pay | Admitting: Oncology

## 2019-08-29 NOTE — Telephone Encounter (Signed)
Patient called answering service reporting " feeling ovaries are swollen". Called patient.  She told me that recently she had chemotherapy and she started to have abdominal pain 1 day after the chemotherapy.  She has been constipated and has used MiraLAX as needed. She had a small bowel movement on Friday and feels today pain is better.  Today currently she reports that her pain is 2 out of 10, constant.  Denies any nausea vomiting.  She feels "knot at the area where her ovary supposed to be" and is concerned. The 'knot" is not painful.  I recommend patient to try over-the-counter stool softener to help her constipation symptoms. For her "knot",  I recommend patient to see Dr.Corcoran for further evaluation. She appreciates explanation and agrees with the plan.

## 2019-08-30 LAB — NOVEL CORONAVIRUS, NAA: SARS-CoV-2, NAA: NOT DETECTED

## 2019-08-31 NOTE — Telephone Encounter (Signed)
  Please call patient.  If she is still having discomfort, we can see her in clinic.  I believe she is scheduled for week #2 carboplatin and Taxol this week.  M

## 2019-08-31 NOTE — Progress Notes (Signed)
Confirmed Name and DOB. Reports abdominal pain and nausea that started on Wednesday with relief on Sunday. Patient did talk to on call dr. At that time (Dr. Tasia Catchings). Started diarrhea today after taking Miralax x 1 week. Reports she was constipated prior to this. Patient also reports some mild cramping in left and right calf area yesterday that comes and goes. Also reports it is in her right foot. Denies any heat or swelling.   Patient also reports lump in right breast is bigger. Reports she noticed it yesterday. Reports some tenderness. Denies any erythema or heat.

## 2019-09-01 ENCOUNTER — Inpatient Hospital Stay: Payer: BC Managed Care – PPO

## 2019-09-01 ENCOUNTER — Encounter: Payer: Self-pay | Admitting: Hematology and Oncology

## 2019-09-01 ENCOUNTER — Inpatient Hospital Stay (HOSPITAL_BASED_OUTPATIENT_CLINIC_OR_DEPARTMENT_OTHER): Payer: BC Managed Care – PPO | Admitting: Hematology and Oncology

## 2019-09-01 ENCOUNTER — Other Ambulatory Visit: Payer: Self-pay

## 2019-09-01 VITALS — BP 110/68 | HR 92 | Temp 98.2°F | Resp 17

## 2019-09-01 VITALS — BP 86/54 | HR 113 | Temp 96.8°F | Resp 16 | Wt 138.4 lb

## 2019-09-01 DIAGNOSIS — Z5111 Encounter for antineoplastic chemotherapy: Secondary | ICD-10-CM | POA: Diagnosis not present

## 2019-09-01 DIAGNOSIS — R197 Diarrhea, unspecified: Secondary | ICD-10-CM | POA: Diagnosis not present

## 2019-09-01 DIAGNOSIS — C50919 Malignant neoplasm of unspecified site of unspecified female breast: Secondary | ICD-10-CM

## 2019-09-01 DIAGNOSIS — E860 Dehydration: Secondary | ICD-10-CM

## 2019-09-01 DIAGNOSIS — C50911 Malignant neoplasm of unspecified site of right female breast: Secondary | ICD-10-CM | POA: Diagnosis not present

## 2019-09-01 LAB — CBC WITH DIFFERENTIAL/PLATELET
Abs Immature Granulocytes: 0.06 K/uL (ref 0.00–0.07)
Basophils Absolute: 0 K/uL (ref 0.0–0.1)
Basophils Relative: 1 %
Eosinophils Absolute: 0.3 K/uL (ref 0.0–0.5)
Eosinophils Relative: 4 %
HCT: 33.1 % — ABNORMAL LOW (ref 36.0–46.0)
Hemoglobin: 10.6 g/dL — ABNORMAL LOW (ref 12.0–15.0)
Immature Granulocytes: 1 %
Lymphocytes Relative: 35 %
Lymphs Abs: 2.4 K/uL (ref 0.7–4.0)
MCH: 27 pg (ref 26.0–34.0)
MCHC: 32 g/dL (ref 30.0–36.0)
MCV: 84.4 fL (ref 80.0–100.0)
Monocytes Absolute: 0.4 K/uL (ref 0.1–1.0)
Monocytes Relative: 6 %
Neutro Abs: 3.7 K/uL (ref 1.7–7.7)
Neutrophils Relative %: 53 %
Platelets: 322 K/uL (ref 150–400)
RBC: 3.92 MIL/uL (ref 3.87–5.11)
RDW: 13.4 % (ref 11.5–15.5)
WBC: 6.9 K/uL (ref 4.0–10.5)
nRBC: 0 % (ref 0.0–0.2)

## 2019-09-01 LAB — COMPREHENSIVE METABOLIC PANEL WITH GFR
ALT: 22 U/L (ref 0–44)
AST: 24 U/L (ref 15–41)
Albumin: 3.8 g/dL (ref 3.5–5.0)
Alkaline Phosphatase: 55 U/L (ref 38–126)
Anion gap: 9 (ref 5–15)
BUN: 13 mg/dL (ref 6–20)
CO2: 21 mmol/L — ABNORMAL LOW (ref 22–32)
Calcium: 9.7 mg/dL (ref 8.9–10.3)
Chloride: 106 mmol/L (ref 98–111)
Creatinine, Ser: 0.55 mg/dL (ref 0.44–1.00)
GFR calc Af Amer: >60 mL/min
GFR calc non Af Amer: >60 mL/min
Glucose, Bld: 110 mg/dL — ABNORMAL HIGH (ref 70–99)
Potassium: 3.7 mmol/L (ref 3.5–5.1)
Sodium: 136 mmol/L (ref 135–145)
Total Bilirubin: 0.3 mg/dL (ref 0.3–1.2)
Total Protein: 7.2 g/dL (ref 6.5–8.1)

## 2019-09-01 LAB — MAGNESIUM: Magnesium: 2 mg/dL (ref 1.7–2.4)

## 2019-09-01 LAB — PREGNANCY, URINE: Preg Test, Ur: NEGATIVE

## 2019-09-01 MED ORDER — FAMOTIDINE IN NACL 20-0.9 MG/50ML-% IV SOLN
20.0000 mg | Freq: Once | INTRAVENOUS | Status: AC
  Administered 2019-09-01: 13:00:00 20 mg via INTRAVENOUS
  Filled 2019-09-01: qty 50

## 2019-09-01 MED ORDER — SODIUM CHLORIDE 0.9 % IV SOLN
Freq: Once | INTRAVENOUS | Status: AC
  Filled 2019-09-01: qty 250

## 2019-09-01 MED ORDER — SODIUM CHLORIDE 0.9 % IV SOLN
80.0000 mg/m2 | Freq: Once | INTRAVENOUS | Status: AC
  Administered 2019-09-01: 13:00:00 132 mg via INTRAVENOUS
  Filled 2019-09-01: qty 22

## 2019-09-01 MED ORDER — PALONOSETRON HCL INJECTION 0.25 MG/5ML
0.2500 mg | Freq: Once | INTRAVENOUS | Status: AC
  Administered 2019-09-01: 12:00:00 0.25 mg via INTRAVENOUS

## 2019-09-01 MED ORDER — HEPARIN SOD (PORK) LOCK FLUSH 100 UNIT/ML IV SOLN
INTRAVENOUS | Status: AC
  Filled 2019-09-01: qty 5

## 2019-09-01 MED ORDER — SODIUM CHLORIDE 0.9 % IV SOLN
221.6000 mg | Freq: Once | INTRAVENOUS | Status: AC
  Administered 2019-09-01: 220 mg via INTRAVENOUS
  Filled 2019-09-01: qty 22

## 2019-09-01 MED ORDER — SODIUM CHLORIDE 0.9% FLUSH
10.0000 mL | INTRAVENOUS | Status: DC | PRN
  Administered 2019-09-01: 09:00:00 10 mL via INTRAVENOUS
  Filled 2019-09-01: qty 10

## 2019-09-01 MED ORDER — DIPHENHYDRAMINE HCL 50 MG/ML IJ SOLN
50.0000 mg | Freq: Once | INTRAMUSCULAR | Status: AC
  Administered 2019-09-01: 50 mg via INTRAVENOUS

## 2019-09-01 MED ORDER — HEPARIN SOD (PORK) LOCK FLUSH 100 UNIT/ML IV SOLN
500.0000 [IU] | Freq: Once | INTRAVENOUS | Status: AC
  Administered 2019-09-01: 500 [IU] via INTRAVENOUS
  Filled 2019-09-01: qty 5

## 2019-09-01 MED ORDER — SODIUM CHLORIDE 0.9 % IV SOLN
10.0000 mg | Freq: Once | INTRAVENOUS | Status: AC
  Administered 2019-09-01: 12:00:00 10 mg via INTRAVENOUS
  Filled 2019-09-01: qty 1

## 2019-09-01 NOTE — Progress Notes (Signed)
Finished 531ml NS bolus over 1 hour. BP 94/64. Patient states she does feel better after fluid. MD gave approval to give chemotherapy today. Completed chemotherapy. No side effects voiced. BP 110/68 at time of discharge.

## 2019-09-01 NOTE — Patient Instructions (Signed)
Instructed patient to begin taking stool softeners per MD. I wrote down docusate sodium 1-2 tabs  Daily. Over the counter . Generic brand is fine.

## 2019-09-04 NOTE — Progress Notes (Signed)
Phoned patient to follow-up on chemotherapy, and to nominate for "[place]" boxes of hope.  Verbal consent for nomination given.

## 2019-09-07 ENCOUNTER — Ambulatory Visit
Admission: RE | Admit: 2019-09-07 | Discharge: 2019-09-07 | Disposition: A | Discharge status: Home / Self Care | Payer: BC Managed Care – PPO | Source: Ambulatory Visit | Attending: Hematology and Oncology | Admitting: Hematology and Oncology

## 2019-09-07 ENCOUNTER — Telehealth: Payer: Self-pay

## 2019-09-07 ENCOUNTER — Encounter: Payer: Self-pay | Admitting: Hematology and Oncology

## 2019-09-07 ENCOUNTER — Other Ambulatory Visit: Payer: Self-pay

## 2019-09-07 DIAGNOSIS — C50919 Malignant neoplasm of unspecified site of unspecified female breast: Secondary | ICD-10-CM | POA: Insufficient documentation

## 2019-09-07 NOTE — Telephone Encounter (Signed)
Radiology called and wanted to give report for this patient [phone]. MD was made aware and given number to contact.

## 2019-09-07 NOTE — Progress Notes (Signed)
The patient c/o having nausea in the last 2 days. The patient Name and DOB has been verified by phone today.

## 2019-09-07 NOTE — Progress Notes (Addendum)
[facility]  [address], [address] [place], [place] [address] Phone: [phone]  Fax: [phone]   Clinic Day:  09/08/2019  Referring physician: [facility], [place]  Chief Complaint: Heather Bell is a 44 y.o. female with clinical stage IIBtriple negativerightbreast cancer who is seen for assessment prior to week #3 of Taxol + carboplatin.   HPI: The patient was last seen in the medical oncology clinic on 09/01/2019. At that time, she had issues with constipation then subsequent diarrhea after taking Miralax.  She felt slightly dizzy.  Her breast mass was larger and felt secondary to her hematoma s/p biopsy.  She received IVF.  She received week #2 Taxol and carboplatin.   Right breast ultrasound on 09/07/2019 revealed the mass in the 7 o'clock position contained cystic components and measured 3 cm (compared to 1.7 cm on 07/10/2019).  The biopsied lymph node with 3 mm cortical thickening in the axilla was similar.  During the interim, she has felt "ok". She had diarrhea on 09/01/2019 and 09/02/2019.  On 09/04/2019, she had soft stool.  She had nausea on 09/05/2019 and 09/06/2019.  Nausea was worse after eating.  She took ondansetron on 09/05/2019 and felt better.  On 09/06/2019, she took ondansetron and Ativan.    She denies any numbness or tinging in her fingers or toes.  She has tenderness and discomfort from the right breast mass when she bumps it. Her left shoulder pain is worse. She denies feeling lightheaded. She has no reflux issues.  Her last bowel movement was semi-soft.   She has not been checking her blood sugar.  She is taking a daily multi-vitamin. Her calcium is 8.7 today.  I advised patient to begin taking calcium supplementation.    Past Medical History:  Diagnosis Date  . Allergic rhinitis   . Anxiety   . Bipolar disorder ([place])    depression  . Cancer [facility])    breast right  . Depression   . Diabetes ([place])   . GERD  (gastroesophageal reflux disease)   . Hyperlipidemia     Past Surgical History:  Procedure Laterality Date  . BREAST BIOPSY Right 07/22/2019   mass bx, path pending, heart marker  . BREAST BIOPSY Right 07/22/2019   LN bx, path pending,  butterfly hydromarker  . IMAGE GUIDED SINUS SURGERY    . KNEE SURGERY    . NASAL SEPTUM SURGERY    . PORTACATH PLACEMENT N/A 08/07/2019   Procedure: INSERTION PORT-A-CATH;  Surgeon: Herbert Pun, MD;  Location: [facility] ORS;  Service: General;  Laterality: N/A;  . TONSILLECTOMY      Family History  Problem Relation Age of Onset  . Lung cancer Father   . Heart attack Father   . Lung cancer Paternal Uncle   . Heart disease Paternal Uncle   . Lung cancer Paternal Aunt     Social History:  reports that she has never smoked. She has never used smokeless tobacco. She reports that she does not drink alcohol or use drugs. denies any exposure to radiation or toxins.She has 2 children (38 year old daughter and 81 year old son).She use to work inthefast foodindustry. She is currently not working. Her husbandisMichael.[name] travels a lot for his job. The patient is alone today.  Allergies:  Allergies  Allergen Reactions  . Compazine [Prochlorperazine Edisylate] Other (See Comments)    Acute dystonic reaction  . Morphine And Related Shortness Of Breath  . Doxycycline Other (See Comments)    "  made all my symptoms worse"   . Fluticasone Other (See Comments)    Doesn't work  . Levofloxacin Other (See Comments)    "made all my symptoms worse"     Current Medications: Current Outpatient Medications  Medication Sig Dispense Refill  . amphetamine-dextroamphetamine (ADDERALL XR) 25 MG 24 hr capsule Take 25 mg by mouth daily.    . cetirizine (ZYRTEC) 10 MG tablet Take 10 mg by mouth daily.     . clonazePAM (KLONOPIN) 1 MG tablet Take 1 mg by mouth 3 (three) times daily as needed for anxiety.    . docusate sodium (COLACE) 100 MG capsule Take  100 mg by mouth 2 (two) times daily.    Marland Kitchen doxepin (SINEQUAN) 25 MG capsule Take 25 mg by mouth at bedtime.     Marland Kitchen ibuprofen (ADVIL) 200 MG tablet Take 400-800 mg by mouth every 6 (six) hours as needed for headache or moderate pain.    Marland Kitchen lamoTRIgine (LAMICTAL) 200 MG tablet Take 200 mg by mouth at bedtime.     . lidocaine-prilocaine (EMLA) cream Apply topically over port-a-cath 30 minutes to 1 hour prior to treatment. 30 g 0  . LORazepam (ATIVAN) 0.5 MG tablet Take 1 tablet (0.5 mg total) by mouth every 6 (six) hours as needed (Nausea or vomiting). 20 tablet 0  . lurasidone (LATUDA) 40 MG TABS tablet Take 40 mg by mouth at bedtime. Take with 60 mg to equal 100 mg at night    . Lurasidone HCl 60 MG TABS Take 60 mg by mouth at bedtime. Take with 40 mg to equal 100 mg at night    . metFORMIN (GLUCOPHAGE-XR) 500 MG 24 hr tablet Take 1,000 mg by mouth 2 (two) times daily.    . mometasone (NASONEX) 50 MCG/ACT nasal spray Place 2 sprays into the nose daily as needed (allergies).     . Multiple Vitamin (MULTIVITAMIN WITH MINERALS) TABS tablet Take 1 tablet by mouth daily.    Marland Kitchen omeprazole (PRILOSEC) 20 MG capsule Take 20 mg by mouth daily.     . QUEtiapine (SEROQUEL XR) 300 MG 24 hr tablet Take 300 mg by mouth at bedtime.    . simvastatin (ZOCOR) 20 MG tablet Take 20 mg by mouth daily.    Marland Kitchen topiramate (TOPAMAX) 100 MG tablet Take 50-100 mg by mouth See admin instructions. Take 100 mg in the morning and 50 mg at night    . ondansetron (ZOFRAN) 8 MG tablet Take 1 tablet (8 mg total) by mouth 2 (two) times daily as needed for refractory nausea / vomiting. Start on day 3 after carboplatin chemo. (Patient not taking: Reported on 08/25/2019) 30 tablet 1  . temazepam (RESTORIL) 15 MG capsule Take 15 mg by mouth at bedtime as needed for sleep.     No current facility-administered medications for this visit.   Facility-Administered Medications Ordered in Other Visits  Medication Dose Route Frequency Provider Last  Rate Last Admin  . heparin lock flush 100 unit/mL  500 Units Intravenous Once Angeleen Horney C, MD      . sodium chloride flush (NS) 0.9 % injection 10 mL  10 mL Intravenous PRN Lequita Asal, MD   10 mL at 09/08/19 9629    Review of Systems  Constitutional: Negative.  Negative for chills, diaphoresis, fever, malaise/fatigue and weight loss (stable).       Feels "ok".  HENT: Negative.  Negative for congestion, ear discharge, ear pain, hearing loss, nosebleeds, sinus pain and sore throat.  Eyes: Negative.  Negative for blurred vision, double vision and photophobia.  Respiratory: Negative.  Negative for cough, hemoptysis, sputum production and shortness of breath.   Cardiovascular: Positive for chest pain (right breast tenderness). Negative for palpitations, orthopnea and leg swelling.  Gastrointestinal: Positive for nausea (2-3 days ago controlled with anti-emetics). Negative for abdominal pain, blood in stool, constipation, diarrhea (last bowel movement was semi-soft), heartburn (improved), melena and vomiting.  Genitourinary: Negative.  Negative for dysuria, hematuria and urgency.       Premenopausal.  Musculoskeletal: Positive for joint pain (left shoulder). Negative for back pain, falls, myalgias and neck pain.  Skin: Negative.  Negative for itching and rash.  Neurological: Negative.  Negative for dizziness, tingling, tremors, sensory change, speech change, focal weakness, weakness and headaches.  Endo/Heme/Allergies: Bruises/bleeds easily (bruising only).       Type II diabetes.  Psychiatric/Behavioral: Negative for memory loss. The patient is not nervous/anxious and does not have insomnia.        Bipolar affective disorder.  All other systems reviewed and are negative.  Performance status (ECOG): 1  Vitals Blood pressure 99/64, pulse 87, temperature (!) 96.9 F (36.1 C), temperature source Tympanic, resp. rate 18, height '5\' 6"'  (1.676 m), weight 138 lb 7.2 oz (62.8 kg), SpO2  100 %.   Physical Exam  Constitutional: She is oriented to person, place, and time. She appears well-developed and well-nourished. No distress.  HENT:  Head: Normocephalic and atraumatic.  Mouth/Throat: Oropharynx is clear and moist. No oropharyngeal exudate.  Short brown hair.  Mask.  Eyes: Pupils are equal, round, and reactive to light. Conjunctivae and EOM are normal. No scleral icterus.  Brown eyes.  Neck: No JVD present.  Cardiovascular: Normal rate, regular rhythm and normal heart sounds.  No murmur heard. Pulmonary/Chest: Effort normal and breath sounds normal. No respiratory distress. She has no wheezes. She has no rales. She exhibits no tenderness. Right breast exhibits tenderness.  RIGHT breast mass 5 x 5.5 cm.  Abdominal: Soft. Bowel sounds are normal. She exhibits no distension and no mass. There is no abdominal tenderness. There is no rebound and no guarding.  Musculoskeletal:        General: No tenderness or edema. Normal range of motion.     Cervical back: Normal range of motion and neck supple.  Lymphadenopathy:       Head (right side): No preauricular, no posterior auricular and no occipital adenopathy present.       Head (left side): No preauricular, no posterior auricular and no occipital adenopathy present.    She has no cervical adenopathy.    She has no axillary adenopathy.       Right: No inguinal and no supraclavicular adenopathy present.       Left: No inguinal and no supraclavicular adenopathy present.  Neurological: She is alert and oriented to person, place, and time.  Skin: Skin is warm and dry. No rash noted. She is not diaphoretic. No erythema. No pallor.  Psychiatric: She has a normal mood and affect. Her behavior is normal. Judgment and thought content normal.  Nursing note and vitals reviewed.   Infusion on 09/08/2019  Component Date Value Ref Range Status  . Magnesium 09/08/2019 1.9  1.7 - 2.4 mg/dL Final   Performed at [facility], [address] [place], [place] [address]  . Sodium 09/08/2019 137  135 - 145 mmol/L Final  . Potassium 09/08/2019 3.4* 3.5 - 5.1 mmol/L Final  . Chloride 09/08/2019 109  98 - 111 mmol/L Final  . CO2 09/08/2019 20* 22 - 32 mmol/L Final  . Glucose, Bld 09/08/2019 91  70 - 99 mg/dL Final  . BUN 09/08/2019 13  6 - 20 mg/dL Final  . Creatinine, Ser 09/08/2019 0.58  0.44 - 1.00 mg/dL Final  . Calcium 09/08/2019 8.7* 8.9 - 10.3 mg/dL Final  . Total Protein 09/08/2019 6.9  6.5 - 8.1 g/dL Final  . Albumin 09/08/2019 3.6  3.5 - 5.0 g/dL Final  . AST 09/08/2019 14* 15 - 41 U/L Final  . ALT 09/08/2019 14  0 - 44 U/L Final  . Alkaline Phosphatase 09/08/2019 57  38 - 126 U/L Final  . Total Bilirubin 09/08/2019 0.6  0.3 - 1.2 mg/dL Final  . GFR calc non Af Amer 09/08/2019 >60  >60 mL/min Final  . GFR calc Af Amer 09/08/2019 >60  >60 mL/min Final  . Anion gap 09/08/2019 8  5 - 15 Final   Performed at [facility], [address], [place], [place] [address]  . WBC 09/08/2019 4.9  4.0 - 10.5 K/uL Final  . RBC 09/08/2019 3.68* 3.87 - 5.11 MIL/uL Final  . Hemoglobin 09/08/2019 10.0* 12.0 - 15.0 g/dL Final  . HCT 09/08/2019 31.1* 36.0 - 46.0 % Final  . MCV 09/08/2019 84.5  80.0 - 100.0 fL Final  . MCH 09/08/2019 27.2  26.0 - 34.0 pg Final  . MCHC 09/08/2019 32.2  30.0 - 36.0 g/dL Final  . RDW 09/08/2019 13.5  11.5 - 15.5 % Final  . Platelets 09/08/2019 322  150 - 400 K/uL Final  . nRBC 09/08/2019 0.0  0.0 - 0.2 % Final  . Neutrophils Relative % 09/08/2019 56  % Final  . Neutro Abs 09/08/2019 2.8  1.7 - 7.7 K/uL Final  . Lymphocytes Relative 09/08/2019 34  % Final  . Lymphs Abs 09/08/2019 1.6  0.7 - 4.0 K/uL Final  . Monocytes Relative 09/08/2019 6  % Final  . Monocytes Absolute 09/08/2019 0.3  0.1 - 1.0 K/uL Final  . Eosinophils Relative 09/08/2019 3  % Final  . Eosinophils Absolute 09/08/2019 0.2  0.0 - 0.5 K/uL Final  . Basophils Relative 09/08/2019 1  % Final  . Basophils Absolute  09/08/2019 0.0  0.0 - 0.1 K/uL Final  . Immature Granulocytes 09/08/2019 0  % Final  . Abs Immature Granulocytes 09/08/2019 0.02  0.00 - 0.07 K/uL Final   Performed at [facility], [address], [place], [place] [address]  . Preg Test, Ur 09/08/2019 NEGATIVE  NEGATIVE Final   Performed at [facility], [address], [place], [place] [address]    Assessment:  Heather Bell is a 44 y.o. female with clinical T1cN1triple negativerightbreast cancers/p ultrasound guided core biopsy. Pathology revealeda grade IIIinvasive mammary carcinoma, no special type of the right breast. Specimen was 10 mm.Ductal carcinoma in situ(DCIS)was not identified.Right axillarylymph nodewas positive for metastatic carcinoma.Tumor was ER - (<1%), PR - (<1%), and Her2/neu -.  Bilateral mammogram and right unilateral ultrasoundon 07/10/2019 revealed a 1.7 x 1.6 x 1.6 cmmass at the7 o'clock region of the right breast 7 cm from the nipple. There was an abnormal1.4 x 0.7 x 0.9 cmaxillary lymph node. Therewas a second3 mmaxillary lymph node with minimal focal cortical thickening.An ultrasound-guided core biopsies of the mass and the enlarged right axillary lymph node was recommended. BI-RADS category 5.  PET scanon 08/11/2019 showed a hypermetabolic mass in the inferior RIGHT breast consistentwith herbreast carcinoma.  There was noevidence hypermetabolic nodal metastasis.There was no distant metastatic disease.There was nopulmonary metastasis or skeletal metastasis.  Echoon 12/24/2020revealedan ejection fraction of50 to 55%. The left ventricle demonstrated global hypokinesis.MUGAon 08/24/2019 revealed an EF of 60%.She is followed by Dr End.  She is s/p week #2 of Taxol + carboplatin (08/25/2019 - 09/01/2019).   Breast mass was larger after initiation of treatment felt secondary to post biopsy hematoma.  Right breast ultrasound on 09/07/2019 revealed  the mass in the 7 o'clock position contained cystic components and measured 3 cm (compared to 1.7 cm on 07/10/2019).  The biopsied lymph node with 3 mm cortical thickening in the axilla was similar.  She is premenopausal.  She stopped taking birth control pills on 08/17/2019.  Symptomatically, she has had some nausea s/p chemotherapy improved with ondansetron and Ativan.  Bowel movements have improved.  Exam reveals a stable breast mass.  Plan: 1.   Labs today:  CBC with diff, CMP, Mg, urine pregnancy test. 2.Clinical stageIIB triple negativeright breast cancer PET scan- no evidence of metastatic disease. Patient undergoing neoadjuvant chemotherapy. Weekly Taxol + carboplatin x 12 followed by AC x 4 with Neulasta support.             She is s/p week #2 Taxol and carboplatin.                         She has had some nausea controlled by ondansetron and Ativan.             Breast mass appears slightly smaller today.                         Mass was 1.7 cm by ultrasound on 07/10/2019.                         Mass s/p biopsy with hematoma was 3.5 cm by exam on 07/30/2019.                         Mass was 5.5 x 6 cm on 09/01/2019 and 5 x 5.5 cm today.                         Suspect hematoma rather than tumor growth.                         Continue close monitoring.                           Anticipate follow-up ultrasound after 6 weeks then 12 weeks of Taxol + carboplatin. Labs reviewed.  Proceed with week #3 Taxol and carboplatin.             Present at tumor board on 09/14/2019.  Follow-up [facility] genetic testing - still pending. 3.Nausea  Discuss management of nausea.  Encourage hydration. 4.   Constipation and diarrhea             Bowel movements are now semi-soft.  Patient no longer taking Miralax or stool softeners.  No further intervention needed. 5.   Normocytic anemia  Etiology unclear.  Suspect chemotherapy  induced anemia.  Patient denies any bleeding.  Diet appear fair.  Check labs (ferritin, iron studies, B12, folate). 6.   Hypokalemia  Etiology likely secondary to initial diarrhea  Rx:  potassium 10 meq po q day (dis: #4). 7.   Hypocalcemia  Calcium 8.7.  Encourage patient to take oral calcium + vitamin D supplementation. 8.   RTC in 1 week for MD assessment, labs (CBC with diff, CMP, Mg, urine pregnancy test) and week #4 Taxol and carboplatin.  I discussed the assessment and treatment plan with the patient.  The patient was provided an opportunity to ask questions and all were answered.  The patient agreed with the plan and demonstrated an understanding of the instructions.  The patient was advised to call back if the symptoms worsen or if the condition fails to improve as anticipated.  I provided 20 minutes of face-to-face time during this this encounter and > 50% was spent counseling as documented under my assessment and plan.    Lequita Asal, MD, PhD    09/08/2019, 9:56 AM  I, Selena Batten, am acting as scribe for [employer]. Mike Gip, MD, PhD.  I, Saqib Cazarez C. Mike Gip, MD, have reviewed the above documentation for accuracy and completeness, and I agree with the above.

## 2019-09-08 ENCOUNTER — Inpatient Hospital Stay: Payer: BC Managed Care – PPO

## 2019-09-08 ENCOUNTER — Inpatient Hospital Stay (HOSPITAL_BASED_OUTPATIENT_CLINIC_OR_DEPARTMENT_OTHER): Payer: BC Managed Care – PPO | Admitting: Hematology and Oncology

## 2019-09-08 ENCOUNTER — Encounter: Payer: Self-pay | Admitting: Hematology and Oncology

## 2019-09-08 VITALS — BP 99/64 | HR 87 | Temp 96.9°F | Resp 18 | Ht 66.0 in | Wt 138.4 lb

## 2019-09-08 DIAGNOSIS — C50911 Malignant neoplasm of unspecified site of right female breast: Secondary | ICD-10-CM | POA: Diagnosis not present

## 2019-09-08 DIAGNOSIS — E876 Hypokalemia: Secondary | ICD-10-CM

## 2019-09-08 DIAGNOSIS — D649 Anemia, unspecified: Secondary | ICD-10-CM

## 2019-09-08 DIAGNOSIS — C50919 Malignant neoplasm of unspecified site of unspecified female breast: Secondary | ICD-10-CM

## 2019-09-08 DIAGNOSIS — Z5111 Encounter for antineoplastic chemotherapy: Secondary | ICD-10-CM

## 2019-09-08 DIAGNOSIS — Z7189 Other specified counseling: Secondary | ICD-10-CM

## 2019-09-08 LAB — COMPREHENSIVE METABOLIC PANEL
ALT: 14 U/L (ref 0–44)
AST: 14 U/L — ABNORMAL LOW (ref 15–41)
Albumin: 3.6 g/dL (ref 3.5–5.0)
Alkaline Phosphatase: 57 U/L (ref 38–126)
Anion gap: 8 (ref 5–15)
BUN: 13 mg/dL (ref 6–20)
CO2: 20 mmol/L — ABNORMAL LOW (ref 22–32)
Calcium: 8.7 mg/dL — ABNORMAL LOW (ref 8.9–10.3)
Chloride: 109 mmol/L (ref 98–111)
Creatinine, Ser: 0.58 mg/dL (ref 0.44–1.00)
GFR calc Af Amer: >60 mL/min (ref >60)
GFR calc non Af Amer: >60 mL/min (ref >60)
Glucose, Bld: 91 mg/dL (ref 70–99)
Potassium: 3.4 mmol/L — ABNORMAL LOW (ref 3.5–5.1)
Sodium: 137 mmol/L (ref 135–145)
Total Bilirubin: 0.6 mg/dL (ref 0.3–1.2)
Total Protein: 6.9 g/dL (ref 6.5–8.1)

## 2019-09-08 LAB — CBC WITH DIFFERENTIAL/PLATELET
Abs Immature Granulocytes: 0.02 10*3/uL (ref 0.00–0.07)
Basophils Absolute: 0 10*3/uL (ref 0.0–0.1)
Basophils Relative: 1 %
Eosinophils Absolute: 0.2 10*3/uL (ref 0.0–0.5)
Eosinophils Relative: 3 %
HCT: 31.1 % — ABNORMAL LOW (ref 36.0–46.0)
Hemoglobin: 10 g/dL — ABNORMAL LOW (ref 12.0–15.0)
Immature Granulocytes: 0 %
Lymphocytes Relative: 34 %
Lymphs Abs: 1.6 10*3/uL (ref 0.7–4.0)
MCH: 27.2 pg (ref 26.0–34.0)
MCHC: 32.2 g/dL (ref 30.0–36.0)
MCV: 84.5 fL (ref 80.0–100.0)
Monocytes Absolute: 0.3 10*3/uL (ref 0.1–1.0)
Monocytes Relative: 6 %
Neutro Abs: 2.8 10*3/uL (ref 1.7–7.7)
Neutrophils Relative %: 56 %
Platelets: 322 10*3/uL (ref 150–400)
RBC: 3.68 MIL/uL — ABNORMAL LOW (ref 3.87–5.11)
RDW: 13.5 % (ref 11.5–15.5)
WBC: 4.9 10*3/uL (ref 4.0–10.5)
nRBC: 0 % (ref 0.0–0.2)

## 2019-09-08 LAB — FOLATE: Folate: 62.7 ng/mL (ref >5.9)

## 2019-09-08 LAB — IRON AND TIBC
Iron: 24 ug/dL — ABNORMAL LOW (ref 28–170)
Saturation Ratios: 7 % — ABNORMAL LOW (ref 10.4–31.8)
TIBC: 343 ug/dL (ref 250–450)
UIBC: 319 ug/dL

## 2019-09-08 LAB — FERRITIN: Ferritin: 21 ng/mL (ref 11–307)

## 2019-09-08 LAB — MAGNESIUM: Magnesium: 1.9 mg/dL (ref 1.7–2.4)

## 2019-09-08 LAB — VITAMIN B12: Vitamin B-12: 240 pg/mL (ref 180–914)

## 2019-09-08 LAB — PREGNANCY, URINE: Preg Test, Ur: NEGATIVE

## 2019-09-08 MED ORDER — SODIUM CHLORIDE 0.9 % IV SOLN
10.0000 mg | Freq: Once | INTRAVENOUS | Status: AC
  Administered 2019-09-08: 11:00:00 10 mg via INTRAVENOUS
  Filled 2019-09-08: qty 1

## 2019-09-08 MED ORDER — SODIUM CHLORIDE 0.9 % IV SOLN
Freq: Once | INTRAVENOUS | Status: AC
  Filled 2019-09-08: qty 250

## 2019-09-08 MED ORDER — PALONOSETRON HCL INJECTION 0.25 MG/5ML
0.2500 mg | Freq: Once | INTRAVENOUS | Status: AC
  Administered 2019-09-08: 11:00:00 0.25 mg via INTRAVENOUS
  Filled 2019-09-08: qty 5

## 2019-09-08 MED ORDER — SODIUM CHLORIDE 0.9% FLUSH
10.0000 mL | INTRAVENOUS | Status: DC | PRN
  Administered 2019-09-08: 09:00:00 10 mL via INTRAVENOUS
  Filled 2019-09-08: qty 10

## 2019-09-08 MED ORDER — POTASSIUM CHLORIDE ER 10 MEQ PO TBCR: 10.0000 meq | EXTENDED_RELEASE_TABLET | Freq: Every day | ORAL | 0 refills | Status: DC

## 2019-09-08 MED ORDER — FAMOTIDINE IN NACL 20-0.9 MG/50ML-% IV SOLN
20.0000 mg | Freq: Once | INTRAVENOUS | Status: AC
  Administered 2019-09-08: 11:00:00 20 mg via INTRAVENOUS
  Filled 2019-09-08: qty 50

## 2019-09-08 MED ORDER — SODIUM CHLORIDE 0.9 % IV SOLN
80.0000 mg/m2 | Freq: Once | INTRAVENOUS | Status: AC
  Administered 2019-09-08: 12:00:00 132 mg via INTRAVENOUS
  Filled 2019-09-08: qty 22

## 2019-09-08 MED ORDER — DIPHENHYDRAMINE HCL 50 MG/ML IJ SOLN
50.0000 mg | Freq: Once | INTRAMUSCULAR | Status: AC
  Administered 2019-09-08: 50 mg via INTRAVENOUS
  Filled 2019-09-08: qty 1

## 2019-09-08 MED ORDER — SODIUM CHLORIDE 0.9 % IV SOLN
221.6000 mg | Freq: Once | INTRAVENOUS | Status: AC
  Administered 2019-09-08: 13:00:00 220 mg via INTRAVENOUS
  Filled 2019-09-08: qty 22

## 2019-09-08 MED ORDER — HEPARIN SOD (PORK) LOCK FLUSH 100 UNIT/ML IV SOLN
500.0000 [IU] | Freq: Once | INTRAVENOUS | Status: AC
  Administered 2019-09-08: 14:00:00 500 [IU] via INTRAVENOUS
  Filled 2019-09-08: qty 5

## 2019-09-08 NOTE — Patient Instructions (Signed)
Carboplatin injection What is this medicine? CARBOPLATIN (KAR boe pla tin) is a chemotherapy drug. It targets fast dividing cells, like cancer cells, and causes these cells to die. This medicine is used to treat ovarian cancer and many other cancers. This medicine may be used for other purposes; ask your health care provider or pharmacist if you have questions. COMMON BRAND NAME(S): Paraplatin What should I tell my health care provider before I take this medicine? They need to know if you have any of these conditions:  blood disorders  hearing problems  kidney disease  recent or ongoing radiation therapy  an unusual or allergic reaction to carboplatin, cisplatin, other chemotherapy, other medicines, foods, dyes, or preservatives  pregnant or trying to get pregnant  breast-feeding How should I use this medicine? This drug is usually given as an infusion into a vein. It is administered in a hospital or clinic by a specially trained health care professional. Talk to your pediatrician regarding the use of this medicine in children. Special care may be needed. Overdosage: If you think you have taken too much of this medicine contact a poison control center or emergency room at once. NOTE: This medicine is only for you. Do not share this medicine with others. What if I miss a dose? It is important not to miss a dose. Call your doctor or health care professional if you are unable to keep an appointment. What may interact with this medicine?  medicines for seizures  medicines to increase blood counts like filgrastim, pegfilgrastim, sargramostim  some antibiotics like amikacin, gentamicin, neomycin, streptomycin, tobramycin  vaccines Talk to your doctor or health care professional before taking any of these medicines:  acetaminophen  aspirin  ibuprofen  ketoprofen  naproxen This list may not describe all possible interactions. Give your health care provider a list of all the  medicines, herbs, non-prescription drugs, or dietary supplements you use. Also tell them if you smoke, drink alcohol, or use illegal drugs. Some items may interact with your medicine. What should I watch for while using this medicine? Your condition will be monitored carefully while you are receiving this medicine. You will need important blood work done while you are taking this medicine. This drug may make you feel generally unwell. This is not uncommon, as chemotherapy can affect healthy cells as well as cancer cells. Report any side effects. Continue your course of treatment even though you feel ill unless your doctor tells you to stop. In some cases, you may be given additional medicines to help with side effects. Follow all directions for their use. Call your doctor or health care professional for advice if you get a fever, chills or sore throat, or other symptoms of a cold or flu. Do not treat yourself. This drug decreases your body's ability to fight infections. Try to avoid being around people who are sick. This medicine may increase your risk to bruise or bleed. Call your doctor or health care professional if you notice any unusual bleeding. Be careful brushing and flossing your teeth or using a toothpick because you may get an infection or bleed more easily. If you have any dental work done, tell your dentist you are receiving this medicine. Avoid taking products that contain aspirin, acetaminophen, ibuprofen, naproxen, or ketoprofen unless instructed by your doctor. These medicines may hide a fever. Do not become pregnant while taking this medicine. Women should inform their doctor if they wish to become pregnant or think they might be pregnant. There is a   potential for serious side effects to an unborn child. Talk to your health care professional or pharmacist for more information. Do not breast-feed an infant while taking this medicine. What side effects may I notice from receiving this  medicine? Side effects that you should report to your doctor or health care professional as soon as possible:  allergic reactions like skin rash, itching or hives, swelling of the face, lips, or tongue  signs of infection - fever or chills, cough, sore throat, pain or difficulty passing urine  signs of decreased platelets or bleeding - bruising, pinpoint red spots on the skin, black, tarry stools, nosebleeds  signs of decreased red blood cells - unusually weak or tired, fainting spells, lightheadedness  breathing problems  changes in hearing  changes in vision  chest pain  high blood pressure  low blood counts - This drug may decrease the number of white blood cells, red blood cells and platelets. You may be at increased risk for infections and bleeding.  nausea and vomiting  pain, swelling, redness or irritation at the injection site  pain, tingling, numbness in the hands or feet  problems with balance, talking, walking  trouble passing urine or change in the amount of urine Side effects that usually do not require medical attention (report to your doctor or health care professional if they continue or are bothersome):  hair loss  loss of appetite  metallic taste in the mouth or changes in taste This list may not describe all possible side effects. Call your doctor for medical advice about side effects. You may report side effects to FDA at 1-800-FDA-1088. Where should I keep my medicine? This drug is given in a hospital or clinic and will not be stored at home. NOTE: This sheet is a summary. It may not cover all possible information. If you have questions about this medicine, talk to your doctor, pharmacist, or health care provider.  2020 Elsevier/Gold Standard (2007-11-11 14:38:05)  Paclitaxel injection What is this medicine? PACLITAXEL (PAK li TAX el) is a chemotherapy drug. It targets fast dividing cells, like cancer cells, and causes these cells to die. This  medicine is used to treat ovarian cancer, breast cancer, lung cancer, Kaposi's sarcoma, and other cancers. This medicine may be used for other purposes; ask your health care provider or pharmacist if you have questions. COMMON BRAND NAME(S): Onxol, Taxol What should I tell my health care provider before I take this medicine? They need to know if you have any of these conditions:  history of irregular heartbeat  liver disease  low blood counts, like low white cell, platelet, or red cell counts  lung or breathing disease, like asthma  tingling of the fingers or toes, or other nerve disorder  an unusual or allergic reaction to paclitaxel, alcohol, polyoxyethylated castor oil, other chemotherapy, other medicines, foods, dyes, or preservatives  pregnant or trying to get pregnant  breast-feeding How should I use this medicine? This drug is given as an infusion into a vein. It is administered in a hospital or clinic by a specially trained health care professional. Talk to your pediatrician regarding the use of this medicine in children. Special care may be needed. Overdosage: If you think you have taken too much of this medicine contact a poison control center or emergency room at once. NOTE: This medicine is only for you. Do not share this medicine with others. What if I miss a dose? It is important not to miss your dose. Call your doctor   or health care professional if you are unable to keep an appointment. What may interact with this medicine? Do not take this medicine with any of the following medications:  disulfiram  metronidazole This medicine may also interact with the following medications:  antiviral medicines for hepatitis, HIV or AIDS  certain antibiotics like erythromycin and clarithromycin  certain medicines for fungal infections like ketoconazole and itraconazole  certain medicines for seizures like carbamazepine, phenobarbital,  phenytoin  gemfibrozil  nefazodone  rifampin  St. John's wort This list may not describe all possible interactions. Give your health care provider a list of all the medicines, herbs, non-prescription drugs, or dietary supplements you use. Also tell them if you smoke, drink alcohol, or use illegal drugs. Some items may interact with your medicine. What should I watch for while using this medicine? Your condition will be monitored carefully while you are receiving this medicine. You will need important blood work done while you are taking this medicine. This medicine can cause serious allergic reactions. To reduce your risk you will need to take other medicine(s) before treatment with this medicine. If you experience allergic reactions like skin rash, itching or hives, swelling of the face, lips, or tongue, tell your doctor or health care professional right away. In some cases, you may be given additional medicines to help with side effects. Follow all directions for their use. This drug may make you feel generally unwell. This is not uncommon, as chemotherapy can affect healthy cells as well as cancer cells. Report any side effects. Continue your course of treatment even though you feel ill unless your doctor tells you to stop. Call your doctor or health care professional for advice if you get a fever, chills or sore throat, or other symptoms of a cold or flu. Do not treat yourself. This drug decreases your body's ability to fight infections. Try to avoid being around people who are sick. This medicine may increase your risk to bruise or bleed. Call your doctor or health care professional if you notice any unusual bleeding. Be careful brushing and flossing your teeth or using a toothpick because you may get an infection or bleed more easily. If you have any dental work done, tell your dentist you are receiving this medicine. Avoid taking products that contain aspirin, acetaminophen, ibuprofen,  naproxen, or ketoprofen unless instructed by your doctor. These medicines may hide a fever. Do not become pregnant while taking this medicine. Women should inform their doctor if they wish to become pregnant or think they might be pregnant. There is a potential for serious side effects to an unborn child. Talk to your health care professional or pharmacist for more information. Do not breast-feed an infant while taking this medicine. Men are advised not to father a child while receiving this medicine. This product may contain alcohol. Ask your pharmacist or healthcare provider if this medicine contains alcohol. Be sure to tell all healthcare providers you are taking this medicine. Certain medicines, like metronidazole and disulfiram, can cause an unpleasant reaction when taken with alcohol. The reaction includes flushing, headache, nausea, vomiting, sweating, and increased thirst. The reaction can last from 30 minutes to several hours. What side effects may I notice from receiving this medicine? Side effects that you should report to your doctor or health care professional as soon as possible:  allergic reactions like skin rash, itching or hives, swelling of the face, lips, or tongue  breathing problems  changes in vision  fast, irregular heartbeat  high   or low blood pressure  mouth sores  pain, tingling, numbness in the hands or feet  signs of decreased platelets or bleeding - bruising, pinpoint red spots on the skin, black, tarry stools, blood in the urine  signs of decreased red blood cells - unusually weak or tired, feeling faint or lightheaded, falls  signs of infection - fever or chills, cough, sore throat, pain or difficulty passing urine  signs and symptoms of liver injury like dark yellow or brown urine; general ill feeling or flu-like symptoms; light-colored stools; loss of appetite; nausea; right upper belly pain; unusually weak or tired; yellowing of the eyes or  skin  swelling of the ankles, feet, hands  unusually slow heartbeat Side effects that usually do not require medical attention (report to your doctor or health care professional if they continue or are bothersome):  diarrhea  hair loss  loss of appetite  muscle or joint pain  nausea, vomiting  pain, redness, or irritation at site where injected  tiredness This list may not describe all possible side effects. Call your doctor for medical advice about side effects. You may report side effects to FDA at 1-800-FDA-1088. Where should I keep my medicine? This drug is given in a hospital or clinic and will not be stored at home. NOTE: This sheet is a summary. It may not cover all possible information. If you have questions about this medicine, talk to your doctor, pharmacist, or health care provider.  2020 Elsevier/Gold Standard (2017-04-09 13:14:55)  

## 2019-09-09 ENCOUNTER — Ambulatory Visit: Payer: BC Managed Care – PPO | Admitting: Internal Medicine

## 2019-09-09 ENCOUNTER — Ambulatory Visit: Payer: Self-pay | Admitting: Hematology and Oncology

## 2019-09-09 ENCOUNTER — Other Ambulatory Visit: Payer: Self-pay

## 2019-09-09 ENCOUNTER — Telehealth: Payer: Self-pay

## 2019-09-09 ENCOUNTER — Ambulatory Visit: Payer: Self-pay

## 2019-09-09 NOTE — Telephone Encounter (Signed)
Advised patient to start taking 1069mcg of vitamin 12 per day and 325mg  of ferrous sulfate BID (gradually/as tolerated). Warned patient of possible constipation side effects and told her that IV infusion is possible if she doesn't tolerate it orally. Also advised her to take with OJ/vitamin C for best absorption.  (per Dr Mike Gip)  Patient expresses understanding and has no questions

## 2019-09-12 NOTE — Progress Notes (Signed)
[facility]  [address], [address] [place], [place] [address] Phone: [phone]  Fax: [phone]   Clinic Day:  09/15/2019  Referring physician: Center, N[place]  Chief Complaint: Heather Bell is a 44 y.o. female with clinical stage IIBtriple negativerightbreast cancer who is seen for assessment prior to week #4 of Taxol + carboplatin.   HPI: The patient was last seen in the medical oncology clinic on 09/08/2019. At that time, she described some nausea s/p chemotherapy which had improved with ondansetron and Ativan.  Bowel movements had improved.  Exam revealed a stable breast mass.  Hematocrit was 31.1, hemoglobin 10.0, platelets 322,000, WBC 4,900.  Potassium was 3.4 and calcium 8.7.  She received week #3 Taxol and carboplatin.  She received a prescription for potassium.  She was encouraged to begin oral calcium.  Anemia work-up included a ferritin of 21 with an iron saturation of 7% and a TIBC of 343.   Vitamin B12 was 240 (low) and folate 62.7.   She was directed to begin 1,000 mcg of vitamin B12 a day and ferrous sulfate 325 mg BID with OJ or vitamin C on 09/09/2019.  She started B12 and calcium on 09/10/2019 and ferrous sulfate on 09/10/2019.  She was presented at tumor board on 09/14/2019.  Continued monitoring of her breast mass was recommended.  During the interim, she felt "not so great". She notes "bad" diarrhea since 09/12/2019. She had 9 bowel movements Saturday. Her first 4 bowel movements were normal. The last 5 were watery. She notes her bowels were painful.  She denies any bloody stool. She had not been around anyone sick and she did not eat any fast foods. She has not had a bowl movement yet today. Her last bowel movement was on 09/12/2019. She took 3 imodium pills on 09/12/2019.   She notes her stomach is "rolling" for which she is taking antacids. She feels nauseated and lightheaded today. She feels dehydrated because she has not  been able to eat anything or drink plenty of fluids since 09/12/2019. This morning she ate [place] toast. On 09/14/2019, she ate [place] toast, skipped lunch and ate a very small portion for dinner.   The lump in her right breast feels about the same. Pain increases in her breast if she bumps into it. Her left shoulder pain is improving. Her blood sugar is well controlled.  She is taking oral iron every other day. She took oral iron with OJ on 09/10/2019 and 09/12/2019. She had nausea on 09/14/2019 after taking oral iron and a chewable vitamin C tablet. She would like to hold her oral iron until symptoms resolve.   She initially stated that she would like to proceed with chemotherapy today. Pulse was 103. Patient was orthostatic. She received IVF.  After her chemotherapy premedications, she declined chemotherapy.   Past Medical History:  Diagnosis Date  . Allergic rhinitis   . Anxiety   . Bipolar disorder ([place])    depression  . Cancer [facility])    breast right  . Depression   . Diabetes ([place])   . GERD (gastroesophageal reflux disease)   . Hyperlipidemia     Past Surgical History:  Procedure Laterality Date  . BREAST BIOPSY Right 07/22/2019   mass bx, path pending, heart marker  . BREAST BIOPSY Right 07/22/2019   LN bx, path pending,  butterfly hydromarker  . IMAGE GUIDED SINUS SURGERY    . KNEE SURGERY    . NASAL SEPTUM  SURGERY    . PORTACATH PLACEMENT N/A 08/07/2019   Procedure: INSERTION PORT-A-CATH;  Surgeon: Herbert Pun, MD;  Location: [facility] ORS;  Service: General;  Laterality: N/A;  . TONSILLECTOMY      Family History  Problem Relation Age of Onset  . Lung cancer Father   . Heart attack Father   . Lung cancer Paternal Uncle   . Heart disease Paternal Uncle   . Lung cancer Paternal Aunt     Social History:  reports that she has never smoked. She has never used smokeless tobacco. She reports that she does not drink alcohol or use drugs. denies any exposure to  radiation or toxins.She has 2 children (26 year old daughter and 7 year old son).She use to work inthefast foodindustry. She is currently not working. Her husbandisMichael.[name] travels a lot for his job. The patient is alone today.  Allergies:  Allergies  Allergen Reactions  . Compazine [Prochlorperazine Edisylate] Other (See Comments)    Acute dystonic reaction  . Morphine And Related Shortness Of Breath  . Doxycycline Other (See Comments)    "made all my symptoms worse"   . Fluticasone Other (See Comments)    Doesn't work  . Levofloxacin Other (See Comments)    "made all my symptoms worse"     Current Medications: Current Outpatient Medications  Medication Sig Dispense Refill  . amphetamine-dextroamphetamine (ADDERALL XR) 25 MG 24 hr capsule Take 25 mg by mouth daily.    . cetirizine (ZYRTEC) 10 MG tablet Take 10 mg by mouth daily.     . clonazePAM (KLONOPIN) 1 MG tablet Take 1 mg by mouth 3 (three) times daily as needed for anxiety.    . docusate sodium (COLACE) 100 MG capsule Take 100 mg by mouth 2 (two) times daily.    Marland Kitchen doxepin (SINEQUAN) 25 MG capsule Take 25 mg by mouth at bedtime.     Marland Kitchen ibuprofen (ADVIL) 200 MG tablet Take 400-800 mg by mouth every 6 (six) hours as needed for headache or moderate pain.    Marland Kitchen lamoTRIgine (LAMICTAL) 200 MG tablet Take 200 mg by mouth at bedtime.     . lidocaine-prilocaine (EMLA) cream Apply topically over port-a-cath 30 minutes to 1 hour prior to treatment. 30 g 0  . LORazepam (ATIVAN) 0.5 MG tablet Take 1 tablet (0.5 mg total) by mouth every 6 (six) hours as needed (Nausea or vomiting). 20 tablet 0  . lurasidone (LATUDA) 40 MG TABS tablet Take 40 mg by mouth at bedtime. Take with 60 mg to equal 100 mg at night    . Lurasidone HCl 60 MG TABS Take 60 mg by mouth at bedtime. Take with 40 mg to equal 100 mg at night    . metFORMIN (GLUCOPHAGE-XR) 500 MG 24 hr tablet Take 1,000 mg by mouth 2 (two) times daily.    . mometasone (NASONEX)  50 MCG/ACT nasal spray Place 2 sprays into the nose daily as needed (allergies).     . Multiple Vitamin (MULTIVITAMIN WITH MINERALS) TABS tablet Take 1 tablet by mouth daily.    Marland Kitchen omeprazole (PRILOSEC) 20 MG capsule Take 20 mg by mouth daily.     . ondansetron (ZOFRAN) 8 MG tablet Take 1 tablet (8 mg total) by mouth 2 (two) times daily as needed for refractory nausea / vomiting. Start on day 3 after carboplatin chemo. 30 tablet 1  . potassium chloride (KLOR-CON) 10 MEQ tablet Take 1 tablet (10 mEq total) by mouth daily. 4 tablet 0  .  QUEtiapine (SEROQUEL XR) 300 MG 24 hr tablet Take 300 mg by mouth at bedtime.    . simvastatin (ZOCOR) 20 MG tablet Take 20 mg by mouth daily.    . temazepam (RESTORIL) 15 MG capsule Take 15 mg by mouth at bedtime as needed for sleep.    Marland Kitchen topiramate (TOPAMAX) 100 MG tablet Take 50-100 mg by mouth See admin instructions. Take 100 mg in the morning and 50 mg at night     No current facility-administered medications for this visit.   Facility-Administered Medications Ordered in Other Visits  Medication Dose Route Frequency Provider Last Rate Last Admin  . heparin lock flush 100 unit/mL  500 Units Intravenous Once Christianne Zacher C, MD      . sodium chloride flush (NS) 0.9 % injection 10 mL  10 mL Intravenous PRN Lequita Asal, MD   10 mL at 09/15/19 6378    Review of Systems  Constitutional: Positive for weight loss (4 pounds). Negative for chills, diaphoresis, fever and malaise/fatigue.       Feeling "not so great". Feels sick today.  HENT: Negative for congestion, ear pain, nosebleeds, sinus pain, sore throat and tinnitus.   Eyes: Negative.  Negative for blurred vision, double vision and photophobia.  Respiratory: Negative.  Negative for cough, hemoptysis, sputum production and shortness of breath.   Cardiovascular: Positive for chest pain (right breast tenderness increases when touched). Negative for palpitations and leg swelling.  Gastrointestinal:  Positive for diarrhea (watery( x 5), since 09/12/2019; painful x 9 bowel movements on 09/12/2019; on imodium x 3), heartburn (reflux; antacids) and nausea. Negative for abdominal pain, blood in stool, constipation, melena and vomiting.       Stomach is "rolling". Dehydrated. Decreased appetite. Last bowel movement on 09/12/2019 after taking imodium.  Eating small portions.  Genitourinary: Negative.  Negative for dysuria, frequency, hematuria and urgency.       Premenopausal.  Musculoskeletal: Negative.  Negative for back pain, joint pain (left shoulder; improving), myalgias and neck pain.  Skin: Negative.  Negative for itching and rash.  Neurological: Negative for dizziness, tingling, sensory change, speech change, focal weakness, weakness and headaches.       Lightheaded.  Endo/Heme/Allergies: Does not bruise/bleed easily.       Type II diabetes.  Psychiatric/Behavioral: Negative for memory loss. The patient is not nervous/anxious and does not have insomnia.        Bipolar affective disorder.  All other systems reviewed and are negative.  Performance status (ECOG):  1  Vitals Blood pressure 101/65, pulse (!) 103, temperature (!) 97.5 F (36.4 C), temperature source Tympanic, weight 134 lb 11.2 oz (61.1 kg), SpO2 100 %.   Physical Exam  Nursing note and vitals reviewed. Constitutional: She is oriented to person, place, and time. She appears well-developed and well-nourished. No distress.  Slightly fatigued appearing woman sitting comfortably in the exam room in no acute distress.  HENT:  Head: Normocephalic and atraumatic.  Mouth/Throat: No oropharyngeal exudate.  Scarf.  Mouth dry.  Mask.   Eyes: Pupils are equal, round, and reactive to light. Conjunctivae and EOM are normal. No scleral icterus.  Brown eyes.  Cardiovascular: Regular rhythm and normal heart sounds. Tachycardia present.  No murmur heard. Respiratory: Effort normal and breath sounds normal. No respiratory distress. She  has no wheezes. She has no rales. She exhibits no tenderness.  RIGHT breast mass  4.5 x 4.5 cm (previously 5 x 5.5 cm).  GI: Soft. Bowel sounds are normal. She exhibits  no distension and no mass. There is no abdominal tenderness. There is no rebound and no guarding.  Musculoskeletal:        General: No tenderness or edema. Normal range of motion.     Cervical back: Normal range of motion and neck supple.  Lymphadenopathy:       Head (right side): No preauricular, no posterior auricular and no occipital adenopathy present.       Head (left side): No preauricular, no posterior auricular and no occipital adenopathy present.    She has no cervical adenopathy.    She has no axillary adenopathy.       Right: No inguinal and no supraclavicular adenopathy present.       Left: No inguinal and no supraclavicular adenopathy present.  Neurological: She is alert and oriented to person, place, and time.  Skin: Skin is warm and dry. No rash noted. She is not diaphoretic. No erythema. No pallor.  Psychiatric: She has a normal mood and affect. Her behavior is normal. Judgment and thought content normal.    No visits with results within 3 Day(s) from this visit.  Latest known visit with results is:  Infusion on 09/08/2019  Component Date Value Ref Range Status  . Magnesium 09/08/2019 1.9  1.7 - 2.4 mg/dL Final   Performed at [facility], [address] [place], [place] [address]  . Sodium 09/08/2019 137  135 - 145 mmol/L Final  . Potassium 09/08/2019 3.4* 3.5 - 5.1 mmol/L Final  . Chloride 09/08/2019 109  98 - 111 mmol/L Final  . CO2 09/08/2019 20* 22 - 32 mmol/L Final  . Glucose, Bld 09/08/2019 91  70 - 99 mg/dL Final  . BUN 09/08/2019 13  6 - 20 mg/dL Final  . Creatinine, Ser 09/08/2019 0.58  0.44 - 1.00 mg/dL Final  . Calcium 09/08/2019 8.7* 8.9 - 10.3 mg/dL Final  . Total Protein 09/08/2019 6.9  6.5 - 8.1 g/dL Final  . Albumin 09/08/2019 3.6  3.5 - 5.0 g/dL Final  . AST 09/08/2019  14* 15 - 41 U/L Final  . ALT 09/08/2019 14  0 - 44 U/L Final  . Alkaline Phosphatase 09/08/2019 57  38 - 126 U/L Final  . Total Bilirubin 09/08/2019 0.6  0.3 - 1.2 mg/dL Final  . GFR calc non Af Amer 09/08/2019 >60  >60 mL/min Final  . GFR calc Af Amer 09/08/2019 >60  >60 mL/min Final  . Anion gap 09/08/2019 8  5 - 15 Final   Performed at [facility], [address] [place], [place] [address]  . WBC 09/08/2019 4.9  4.0 - 10.5 K/uL Final  . RBC 09/08/2019 3.68* 3.87 - 5.11 MIL/uL Final  . Hemoglobin 09/08/2019 10.0* 12.0 - 15.0 g/dL Final  . HCT 09/08/2019 31.1* 36.0 - 46.0 % Final  . MCV 09/08/2019 84.5  80.0 - 100.0 fL Final  . MCH 09/08/2019 27.2  26.0 - 34.0 pg Final  . MCHC 09/08/2019 32.2  30.0 - 36.0 g/dL Final  . RDW 09/08/2019 13.5  11.5 - 15.5 % Final  . Platelets 09/08/2019 322  150 - 400 K/uL Final  . nRBC 09/08/2019 0.0  0.0 - 0.2 % Final  . Neutrophils Relative % 09/08/2019 56  % Final  . Neutro Abs 09/08/2019 2.8  1.7 - 7.7 K/uL Final  . Lymphocytes Relative 09/08/2019 34  % Final  . Lymphs Abs 09/08/2019 1.6  0.7 - 4.0 K/uL Final  . Monocytes Relative 09/08/2019 6  %  Final  . Monocytes Absolute 09/08/2019 0.3  0.1 - 1.0 K/uL Final  . Eosinophils Relative 09/08/2019 3  % Final  . Eosinophils Absolute 09/08/2019 0.2  0.0 - 0.5 K/uL Final  . Basophils Relative 09/08/2019 1  % Final  . Basophils Absolute 09/08/2019 0.0  0.0 - 0.1 K/uL Final  . Immature Granulocytes 09/08/2019 0  % Final  . Abs Immature Granulocytes 09/08/2019 0.02  0.00 - 0.07 K/uL Final   Performed at [facility], [address] [place], [place] [address]  . Preg Test, Ur 09/08/2019 NEGATIVE  NEGATIVE Final   Performed at [facility], [address], [place], [place] [address]  . Folate 09/08/2019 62.7  >5.9 ng/mL Final   Comment: RESULT CONFIRMED BY MANUAL DILUTION KLW Performed at [facility], [address] [place], [place] [address]   .  Vitamin B-12 09/08/2019 240  180 - 914 pg/mL Final   Comment: (NOTE) This assay is not validated for testing neonatal or myeloproliferative syndrome specimens for Vitamin B12 levels. Performed at [facility], [place] [address], [place], [place] [address]   . Iron 09/08/2019 24* 28 - 170 ug/dL Final  . TIBC 09/08/2019 343  250 - 450 ug/dL Final  . Saturation Ratios 09/08/2019 7* 10.4 - 31.8 % Final  . UIBC 09/08/2019 319  ug/dL Final   Performed at [facility], [address] [place], [place] [address]  . Ferritin 09/08/2019 21  11 - 307 ng/mL Final   Performed at [facility], [address] [place], [place] [address]    Assessment:  Heather Bell is a 44 y.o. female with clinical T1cN1triple negativerightbreast cancers/p ultrasound guided core biopsy. Pathology revealeda grade IIIinvasive mammary carcinoma, no special type of the right breast. Specimen was 10 mm.Ductal carcinoma in situ(DCIS)was not identified.Right axillarylymph nodewas positive for metastatic carcinoma.Tumor was ER - (<1%), PR - (<1%), and Her2/neu -.  Bilateral mammogram and right unilateral ultrasoundon 07/10/2019 revealed a 1.7 x 1.6 x 1.6 cmmass at the7 o'clock region of the right breast 7 cm from the nipple. There was an abnormal1.4 x 0.7 x 0.9 cmaxillary lymph node. Therewas a second3 mmaxillary lymph node with minimal focal cortical thickening.An ultrasound-guided core biopsies of the mass and the enlarged right axillary lymph node was recommended. BI-RADS category 5.  PET scanon 08/11/2019 showed a hypermetabolic mass in the inferior RIGHT breast consistentwith herbreast carcinoma. There was noevidence hypermetabolic nodal metastasis.There was no distant metastatic disease.There was nopulmonary metastasis or skeletal metastasis.  [place] 12/24/2020revealedan ejection fraction of50 to 55%. The left ventricle demonstrated global  hypokinesis.MUGAon 08/24/2019 revealed an EF of 60%.She is followed by Dr End.  She is s/p week #3 of Taxol + carboplatin (08/25/2019 - 09/08/2019).   Breast mass was larger after initiation of treatment felt secondary to post biopsy hematoma.  Right breast ultrasound on 09/07/2019 revealed the mass in the 7 o'clock position contained cystic components and measured 3 cm (compared to 1.7 cm on 07/10/2019).  The biopsied lymph node with 3 mm cortical thickening in the axilla was similar.  She is premenopausal.  She stopped taking birth control pills on 08/17/2019.  She has iron deficiency.  Ferritin was 21 with an iron saturation of 7% and a TIBC 343 on 09/08/2019.  She is intolerant of oral iron.  She has B12 deficiency.  B12 was 240 on 09/08/2019.  She began oral B12 on 09/10/2019.  Symptomatically, she feels bad today.  She  has had GI symptoms since initiation of oral iron.  Fluid intake is poor.  She is dehydrated and orthostatic.  Breast mass is smaller.  Plan: 1.   Labs today:  CBC with diff, CMP, Mg, urine pregnancy test. 2.Clinical stageIIB triple negativeright breast cancer PET scan- no evidence of metastatic disease. Patient undergoing neoadjuvant chemotherapy. Weekly Taxol + carboplatin x 12 followed by AC x 4 with Neulasta support.             She is s/p week #3 Taxol and carboplatin.                         She has had some nausea controlled by ondansetron and Ativan.             Breast mass is smaller today:                         5.5 x 6 cm on 09/01/2019, 5 x 5.5 cm on 09/08/2019 and 4.5 x 4.5 cm on 09/15/2019.                         Continue to monitor weekly.                         Anticipate follow-up ultrasound after 6 weeks then 12 weeks of Taxol + carboplatin. Labs reviewed.  Initial plan to proceed with week #4 Taxol and carboplatin per patient request.   Patient then declined chemotherapy in  infusion center.   Discuss plan for cycle #4 next week.  Follow-up [facility] genetic testing. 3.GI upset and diarrhea             Etiology appears secondary to oral iron.  Discontinue oral iron.  Discuss initiation of IV iron. 4.   Dehydration and orthostasis  Patient drinks little fluids.  Encourage fluid intake.  Orthostatic today.   500 cc NS then recheck orthostatics.   Patient received an additional 500 cc NS. 5.   Iron deficiency anemia  Hematocrit 33.0.  Hemoglobin 10.7.  MCV 84.6 today.   Ferritin 21 with an iron saturation of 7% and a TIBC of 343 on 09/08/2019.  Patient denies any bleeding.  Diet is fair.  Patient intolerant of oral iron.  Discuss IV iron.   Potential side effects reviewed.   Preauth Venofer.   Information provided.   Patient consented.  Plan to begin IV iron this week. 6.   B12 deficiency  B12 was 240 on 09/08/2019.  Patient on oral B12 since 09/10/2019.  Check B12 in 1 month. 7.   Week #4 carboplatin and Taxol POSTPONED today. 8.   RTC on 09/18/2019 for IVF + Venofer. 9.   RTC on 09/22/2019 for MD assessment, labs (CBC with diff, CMP, Mg), and week #4 carboplatin + Taxol  I discussed the assessment and treatment plan with the patient.  The patient was provided an opportunity to ask questions and all were answered.  The patient agreed with the plan and demonstrated an understanding of the instructions.  The patient was advised to call back if the symptoms worsen or if the condition fails to improve as anticipated.  I provided 22 minutes of face-to-face time initially during this this encounter and > 50% was spent counseling as documented under my assessment and plan. An additional 5-10 minutes were spent with reassessment while patient was in the  infusion center.   Lequita Asal, MD, PhD    09/15/2019, 9:37 AM  I, Selena Batten, am acting as scribe for [employer]. Mike Gip, MD, PhD.  I, Marvyn Torrez C. Mike Gip, MD, have reviewed the above  documentation for accuracy and completeness, and I agree with the above.

## 2019-09-14 ENCOUNTER — Other Ambulatory Visit: Payer: Self-pay

## 2019-09-14 ENCOUNTER — Encounter: Payer: Self-pay | Admitting: Hematology and Oncology

## 2019-09-14 NOTE — Progress Notes (Signed)
No new changes noted today. The patient name and DOB has been verified by phone today. I have also explained to the patient that her [facility] test result should be resulted on 1/29 - 2/9. The patient was understanding.

## 2019-09-15 ENCOUNTER — Encounter: Payer: Self-pay | Admitting: Hematology and Oncology

## 2019-09-15 ENCOUNTER — Inpatient Hospital Stay: Payer: BC Managed Care – PPO

## 2019-09-15 ENCOUNTER — Inpatient Hospital Stay (HOSPITAL_BASED_OUTPATIENT_CLINIC_OR_DEPARTMENT_OTHER): Payer: BC Managed Care – PPO | Admitting: Hematology and Oncology

## 2019-09-15 VITALS — BP 101/65 | HR 103 | Temp 97.5°F | Wt 134.7 lb

## 2019-09-15 VITALS — BP 99/65 | HR 94 | Temp 97.1°F | Resp 18

## 2019-09-15 DIAGNOSIS — E538 Deficiency of other specified B group vitamins: Secondary | ICD-10-CM | POA: Diagnosis not present

## 2019-09-15 DIAGNOSIS — C50919 Malignant neoplasm of unspecified site of unspecified female breast: Secondary | ICD-10-CM | POA: Diagnosis not present

## 2019-09-15 DIAGNOSIS — I951 Orthostatic hypotension: Secondary | ICD-10-CM

## 2019-09-15 DIAGNOSIS — C50911 Malignant neoplasm of unspecified site of right female breast: Secondary | ICD-10-CM | POA: Diagnosis not present

## 2019-09-15 DIAGNOSIS — Z7189 Other specified counseling: Secondary | ICD-10-CM

## 2019-09-15 DIAGNOSIS — R197 Diarrhea, unspecified: Secondary | ICD-10-CM

## 2019-09-15 DIAGNOSIS — D509 Iron deficiency anemia, unspecified: Secondary | ICD-10-CM | POA: Diagnosis not present

## 2019-09-15 DIAGNOSIS — D649 Anemia, unspecified: Secondary | ICD-10-CM | POA: Insufficient documentation

## 2019-09-15 DIAGNOSIS — E876 Hypokalemia: Secondary | ICD-10-CM

## 2019-09-15 DIAGNOSIS — Z5111 Encounter for antineoplastic chemotherapy: Secondary | ICD-10-CM

## 2019-09-15 DIAGNOSIS — E860 Dehydration: Secondary | ICD-10-CM

## 2019-09-15 LAB — COMPREHENSIVE METABOLIC PANEL
ALT: 13 U/L (ref 0–44)
AST: 15 U/L (ref 15–41)
Albumin: 3.9 g/dL (ref 3.5–5.0)
Alkaline Phosphatase: 56 U/L (ref 38–126)
Anion gap: 10 (ref 5–15)
BUN: 12 mg/dL (ref 6–20)
CO2: 21 mmol/L — ABNORMAL LOW (ref 22–32)
Calcium: 9.1 mg/dL (ref 8.9–10.3)
Chloride: 108 mmol/L (ref 98–111)
Creatinine, Ser: 0.69 mg/dL (ref 0.44–1.00)
GFR calc Af Amer: >60 mL/min (ref >60)
GFR calc non Af Amer: >60 mL/min (ref >60)
Glucose, Bld: 85 mg/dL (ref 70–99)
Potassium: 3.6 mmol/L (ref 3.5–5.1)
Sodium: 139 mmol/L (ref 135–145)
Total Bilirubin: 0.6 mg/dL (ref 0.3–1.2)
Total Protein: 7.4 g/dL (ref 6.5–8.1)

## 2019-09-15 LAB — CBC WITH DIFFERENTIAL/PLATELET
Abs Immature Granulocytes: 0.02 10*3/uL (ref 0.00–0.07)
Basophils Absolute: 0.1 10*3/uL (ref 0.0–0.1)
Basophils Relative: 2 %
Eosinophils Absolute: 0.2 10*3/uL (ref 0.0–0.5)
Eosinophils Relative: 4 %
HCT: 33 % — ABNORMAL LOW (ref 36.0–46.0)
Hemoglobin: 10.7 g/dL — ABNORMAL LOW (ref 12.0–15.0)
Immature Granulocytes: 1 %
Lymphocytes Relative: 43 %
Lymphs Abs: 1.8 10*3/uL (ref 0.7–4.0)
MCH: 27.4 pg (ref 26.0–34.0)
MCHC: 32.4 g/dL (ref 30.0–36.0)
MCV: 84.6 fL (ref 80.0–100.0)
Monocytes Absolute: 0.2 10*3/uL (ref 0.1–1.0)
Monocytes Relative: 6 %
Neutro Abs: 1.8 10*3/uL (ref 1.7–7.7)
Neutrophils Relative %: 44 %
Platelets: 371 10*3/uL (ref 150–400)
RBC: 3.9 MIL/uL (ref 3.87–5.11)
RDW: 13.7 % (ref 11.5–15.5)
WBC: 4 10*3/uL (ref 4.0–10.5)
nRBC: 0 % (ref 0.0–0.2)

## 2019-09-15 LAB — MAGNESIUM: Magnesium: 2.1 mg/dL (ref 1.7–2.4)

## 2019-09-15 LAB — PREGNANCY, URINE: Preg Test, Ur: NEGATIVE

## 2019-09-15 MED ORDER — SODIUM CHLORIDE 0.9 % IV SOLN
80.0000 mg/m2 | Freq: Once | INTRAVENOUS | Status: DC
  Filled 2019-09-15: qty 22

## 2019-09-15 MED ORDER — SODIUM CHLORIDE 0.9 % IV SOLN
10.0000 mg | Freq: Once | INTRAVENOUS | Status: DC
  Filled 2019-09-15: qty 1

## 2019-09-15 MED ORDER — HEPARIN SOD (PORK) LOCK FLUSH 100 UNIT/ML IV SOLN
500.0000 [IU] | Freq: Once | INTRAVENOUS | Status: AC
  Administered 2019-09-15: 500 [IU] via INTRAVENOUS
  Filled 2019-09-15: qty 5

## 2019-09-15 MED ORDER — FAMOTIDINE IN NACL 20-0.9 MG/50ML-% IV SOLN: 20.0000 mg | Freq: Once | INTRAVENOUS | Status: DC

## 2019-09-15 MED ORDER — SODIUM CHLORIDE 0.9 % IV SOLN
221.6000 mg | Freq: Once | INTRAVENOUS | Status: DC
  Filled 2019-09-15: qty 22

## 2019-09-15 MED ORDER — SODIUM CHLORIDE 0.9 % IV SOLN
INTRAVENOUS | Status: DC
  Filled 2019-09-15 (×2): qty 250

## 2019-09-15 MED ORDER — SODIUM CHLORIDE 0.9 % IV SOLN
Freq: Once | INTRAVENOUS | Status: DC
  Filled 2019-09-15: qty 250

## 2019-09-15 MED ORDER — PALONOSETRON HCL INJECTION 0.25 MG/5ML
0.2500 mg | Freq: Once | INTRAVENOUS | Status: AC
  Administered 2019-09-15: 0.25 mg via INTRAVENOUS

## 2019-09-15 MED ORDER — DIPHENHYDRAMINE HCL 50 MG/ML IJ SOLN: 50.0000 mg | Freq: Once | INTRAMUSCULAR | Status: DC

## 2019-09-15 MED ORDER — SODIUM CHLORIDE 0.9% FLUSH
10.0000 mL | INTRAVENOUS | Status: DC | PRN
  Administered 2019-09-15: 10 mL via INTRAVENOUS
  Filled 2019-09-15: qty 10

## 2019-09-15 NOTE — Progress Notes (Signed)
Patient came into infusion from seeing MD sat in recliner and stated she is nauseated and weak. I informed her we would be giving her 1 hour of fluid prior to chemo today. She promptly went into her pocketbook and took a nausea pill. RN asked what nausea pill did she just take. She showed me the ativan bottle. Patient still feeling nauseated. I gave her the Aloxi a pre med for chemo. Patient became anxious still nauseated. MD ordered another 548ml of NS over another hour. Decision made to cancel chemotherapy for today. Patient will come back on Friday for IVFs and IV Venofer . Chemotherapy scheduled for next Tuesday.

## 2019-09-15 NOTE — Progress Notes (Signed)
HR 103 ok to treat per md

## 2019-09-16 ENCOUNTER — Inpatient Hospital Stay: Payer: BC Managed Care – PPO

## 2019-09-16 ENCOUNTER — Ambulatory Visit: Payer: BC Managed Care – PPO | Attending: Internal Medicine

## 2019-09-16 DIAGNOSIS — Z20822 Contact with and (suspected) exposure to covid-19: Secondary | ICD-10-CM

## 2019-09-17 ENCOUNTER — Inpatient Hospital Stay: Payer: BC Managed Care – PPO

## 2019-09-17 LAB — NOVEL CORONAVIRUS, NAA: SARS-CoV-2, NAA: NOT DETECTED

## 2019-09-18 ENCOUNTER — Other Ambulatory Visit: Payer: Self-pay

## 2019-09-18 ENCOUNTER — Inpatient Hospital Stay: Payer: BC Managed Care – PPO

## 2019-09-18 VITALS — BP 98/64 | HR 92 | Temp 97.4°F | Resp 18

## 2019-09-18 DIAGNOSIS — D509 Iron deficiency anemia, unspecified: Secondary | ICD-10-CM

## 2019-09-18 DIAGNOSIS — C50911 Malignant neoplasm of unspecified site of right female breast: Secondary | ICD-10-CM | POA: Diagnosis not present

## 2019-09-18 MED ORDER — IRON SUCROSE 20 MG/ML IV SOLN
200.0000 mg | Freq: Once | INTRAVENOUS | Status: AC
  Administered 2019-09-18: 200 mg via INTRAVENOUS

## 2019-09-18 MED ORDER — SODIUM CHLORIDE 0.9% FLUSH
10.0000 mL | INTRAVENOUS | Status: DC | PRN
  Administered 2019-09-18: 10 mL via INTRAVENOUS
  Filled 2019-09-18: qty 10

## 2019-09-18 MED ORDER — SODIUM CHLORIDE 0.9 % IV SOLN
Freq: Once | INTRAVENOUS | Status: AC
  Filled 2019-09-18: qty 250

## 2019-09-18 MED ORDER — HEPARIN SOD (PORK) LOCK FLUSH 100 UNIT/ML IV SOLN
500.0000 [IU] | Freq: Once | INTRAVENOUS | Status: AC
  Administered 2019-09-18: 500 [IU] via INTRAVENOUS
  Filled 2019-09-18: qty 5

## 2019-09-18 MED ORDER — SODIUM CHLORIDE 0.9 % IV SOLN: 200.0000 mg | Freq: Once | INTRAVENOUS | Status: DC

## 2019-09-18 NOTE — Patient Instructions (Signed)

## 2019-09-18 NOTE — Progress Notes (Signed)
Patient feeling better today.  No complaints of dizziness, nausea, or feeling poorly.  Patient given first venofer treatment without issues.  Patient instructed to increase fluid volume and to keep fluids readily available so that she stays hydrated.  Patient verbalized understanding.

## 2019-09-21 ENCOUNTER — Other Ambulatory Visit: Payer: Self-pay

## 2019-09-21 NOTE — Progress Notes (Signed)
Confirmed Name and DOB. Denies any concerns.  

## 2019-09-21 NOTE — Progress Notes (Signed)
[facility]  [address], [address] [place], [place] [address] Phone: [phone]  Fax: [phone]   Clinic Day:  09/22/2019  Referring physician: [facility], [place]  Chief Complaint: Heather Bell is a 44 y.o. female with clinical stage IIBtriple negativerightbreast cancer who is seen for assessment prior to week #4 of Taxol + carboplatin.   HPI: The patient was last seen in the medical oncology clinic on 09/15/2019. At that time, she felt "bad".  She described GI symptoms since initiation of oral iron.  Fluid intake was poor.  She was dehydrated and orthostatic.  Breast mass was smaller.  Hematocrit was 33.0, hemoglobin 10.7, platelets 371,000, WBC 4,000. CMP was normal. Magnesium was 2.1. Patient decided to cancel chemotherapy secondary to feeling weak and nauseated and planned to return on 09/18/2019 for IV iron.   She received Venofer on 09/18/2019.   She tolerated the IV iron well.   During the interim, she has felt "ok".  She denies any vomiting.  Nausea and diarrhea have resolved. She is eating regularly and has a normal appetite. She is drinking 32 oz of fluid on a "good day". When she getting up an walks around she is not dizzy or lightheaded.  I encouraged her to drink at least 1500 cc (50 oz) of fluid a day and keep a log of fluid intake.   She notes that she is not active; she sits around her home most of the day. She notes her stool was hard yesterday; she began taking stool softeners. She notes her right breast lump is smaller.  Her breast is not tender. Her blood sugar has remained normal. Her left shoulder continues to improve.    Past Medical History:  Diagnosis Date  . Allergic rhinitis   . Anxiety   . Bipolar disorder ([place])    depression  . Cancer [facility])    breast right  . Depression   . Diabetes ([place])   . GERD (gastroesophageal reflux disease)   . Hyperlipidemia     Past Surgical History:  Procedure Laterality Date  . BREAST  BIOPSY Right 07/22/2019   mass bx, path pending, heart marker  . BREAST BIOPSY Right 07/22/2019   LN bx, path pending,  butterfly hydromarker  . IMAGE GUIDED SINUS SURGERY    . KNEE SURGERY    . NASAL SEPTUM SURGERY    . PORTACATH PLACEMENT N/A 08/07/2019   Procedure: INSERTION PORT-A-CATH;  Surgeon: Herbert Pun, MD;  Location: [facility] ORS;  Service: General;  Laterality: N/A;  . TONSILLECTOMY      Family History  Problem Relation Age of Onset  . Lung cancer Father   . Heart attack Father   . Lung cancer Paternal Uncle   . Heart disease Paternal Uncle   . Lung cancer Paternal Aunt     Social History:  reports that she has never smoked. She has never used smokeless tobacco. She reports that she does not drink alcohol or use drugs. Denies any exposure to radiation or toxins.She has 2 children (72 year old daughter and 14 year old son).She use to work inthefast foodindustry. She is currently not working. Her husbandisMichael.[name] travels a lot for his job.  The patient is alone today.  Allergies:  Allergies  Allergen Reactions  . Compazine [Prochlorperazine Edisylate] Other (See Comments)    Acute dystonic reaction  . Morphine And Related Shortness Of Breath  . Doxycycline Other (See Comments)    "made all my symptoms worse"   .  Fluticasone Other (See Comments)    Doesn't work  . Levofloxacin Other (See Comments)    "made all my symptoms worse"     Current Medications: Current Outpatient Medications  Medication Sig Dispense Refill  . amphetamine-dextroamphetamine (ADDERALL XR) 25 MG 24 hr capsule Take 25 mg by mouth daily.    . cetirizine (ZYRTEC) 10 MG tablet Take 10 mg by mouth daily.     . clonazePAM (KLONOPIN) 1 MG tablet Take 1 mg by mouth 3 (three) times daily as needed for anxiety.    . docusate sodium (COLACE) 100 MG capsule Take 100 mg by mouth 2 (two) times daily.    Marland Kitchen doxepin (SINEQUAN) 25 MG capsule Take 25 mg by mouth at bedtime.     Marland Kitchen  ibuprofen (ADVIL) 200 MG tablet Take 400-800 mg by mouth every 6 (six) hours as needed for headache or moderate pain.    Marland Kitchen lamoTRIgine (LAMICTAL) 200 MG tablet Take 200 mg by mouth at bedtime.     . lidocaine-prilocaine (EMLA) cream Apply topically over port-a-cath 30 minutes to 1 hour prior to treatment. 30 g 0  . LORazepam (ATIVAN) 0.5 MG tablet Take 1 tablet (0.5 mg total) by mouth every 6 (six) hours as needed (Nausea or vomiting). 20 tablet 0  . lurasidone (LATUDA) 40 MG TABS tablet Take 40 mg by mouth at bedtime. Take with 60 mg to equal 100 mg at night    . Lurasidone HCl 60 MG TABS Take 60 mg by mouth at bedtime. Take with 40 mg to equal 100 mg at night    . metFORMIN (GLUCOPHAGE-XR) 500 MG 24 hr tablet Take 1,000 mg by mouth 2 (two) times daily.    . mometasone (NASONEX) 50 MCG/ACT nasal spray Place 2 sprays into the nose daily as needed (allergies).     . Multiple Vitamin (MULTIVITAMIN WITH MINERALS) TABS tablet Take 1 tablet by mouth daily.    Marland Kitchen omeprazole (PRILOSEC) 20 MG capsule Take 20 mg by mouth daily.     . potassium chloride (KLOR-CON) 10 MEQ tablet Take 1 tablet (10 mEq total) by mouth daily. 4 tablet 0  . QUEtiapine (SEROQUEL XR) 300 MG 24 hr tablet Take 300 mg by mouth at bedtime.    . simvastatin (ZOCOR) 20 MG tablet Take 20 mg by mouth daily.    Marland Kitchen topiramate (TOPAMAX) 100 MG tablet Take 50-100 mg by mouth See admin instructions. Take 100 mg in the morning and 50 mg at night    . ondansetron (ZOFRAN) 8 MG tablet Take 1 tablet (8 mg total) by mouth 2 (two) times daily as needed for refractory nausea / vomiting. Start on day 3 after carboplatin chemo. (Patient not taking: Reported on 09/21/2019) 30 tablet 1   No current facility-administered medications for this visit.   Facility-Administered Medications Ordered in Other Visits  Medication Dose Route Frequency Provider Last Rate Last Admin  . heparin lock flush 100 unit/mL  500 Units Intravenous Once Kmarion Rawl C, MD        . sodium chloride flush (NS) 0.9 % injection 10 mL  10 mL Intravenous PRN Lequita Asal, MD   10 mL at 09/22/19 0920    Review of Systems  Constitutional: Positive for weight loss (3 pounds). Negative for chills, diaphoresis, fever and malaise/fatigue.       Feels "ok".  Not active.  HENT: Negative.  Negative for congestion, ear pain, hearing loss, nosebleeds, sinus pain and sore throat.   Eyes: Negative.  Negative for blurred vision, double vision and photophobia.  Respiratory: Negative for cough, hemoptysis, sputum production and shortness of breath.   Cardiovascular: Negative for chest pain (right breast tenderness- improved), palpitations and leg swelling.  Gastrointestinal: Positive for constipation. Negative for abdominal pain, blood in stool, diarrhea, heartburn (reflux; antacids), melena, nausea and vomiting.       Eating well.  Normal appetite.  Genitourinary: Negative for dysuria, frequency, hematuria and urgency.       Premenopausal.   Musculoskeletal: Negative.  Negative for back pain, joint pain (left shoulder- improving), myalgias and neck pain.  Skin: Negative for itching and rash.       Right breast lump is smaller.  Neurological: Negative.  Negative for dizziness, tingling, sensory change, speech change, focal weakness, weakness and headaches.  Endo/Heme/Allergies: Does not bruise/bleed easily.       Type II diabetes.  Psychiatric/Behavioral: Negative.  Negative for depression and memory loss. The patient is not nervous/anxious and does not have insomnia.   All other systems reviewed and are negative.  Performance status (ECOG): 1  Vitals Blood pressure 96/63, pulse 93, temperature 98.2 F (36.8 C), temperature source Tympanic, resp. rate 18, height '5\' 6"'  (1.676 m), weight 137 lb 5.6 oz (62.3 kg).   Physical Exam  Constitutional: She is oriented to person, place, and time. She appears well-developed and well-nourished. No distress.  HENT:  Head: Normocephalic  and atraumatic.  Mouth/Throat: Oropharynx is clear and moist. Mucous membranes are dry. No oropharyngeal exudate.  Should length brown wig.  Mask.  Eyes: Pupils are equal, round, and reactive to light. Conjunctivae and EOM are normal. No scleral icterus.  Brown eyes.  Neck: No JVD present.  Cardiovascular: Normal rate, regular rhythm and normal heart sounds.  No murmur heard. Pulmonary/Chest: Effort normal and breath sounds normal. No respiratory distress. She has no wheezes. She has no rales. She exhibits no tenderness.  RIGHT breast mass is diffuse and flat 2.8 x 3.0 cm (previously 4.5 x 4.5 cm).  Abdominal: Soft. Bowel sounds are normal. She exhibits no distension and no mass. There is no abdominal tenderness. There is no rebound and no guarding.  Musculoskeletal:        General: No tenderness or edema. Normal range of motion.     Cervical back: Normal range of motion and neck supple.  Lymphadenopathy:       Head (right side): No preauricular, no posterior auricular and no occipital adenopathy present.       Head (left side): No preauricular, no posterior auricular and no occipital adenopathy present.    She has no cervical adenopathy.    She has no axillary adenopathy.       Right: No inguinal and no supraclavicular adenopathy present.       Left: No inguinal and no supraclavicular adenopathy present.  Neurological: She is alert and oriented to person, place, and time.  Skin: Skin is warm and dry. No rash noted. No erythema. No pallor.  Psychiatric: She has a normal mood and affect. Her behavior is normal. Judgment and thought content normal.  Nursing note and vitals reviewed.   No visits with results within 3 Day(s) from this visit.  Latest known visit with results is:  Lab on 09/16/2019  Component Date Value Ref Range Status  . SARS-CoV-2, NAA 09/16/2019 Not Detected  Not Detected Final   Comment: This nucleic acid amplification test was developed and its  performance characteristics determined by [employer]. Nucleic acid amplification tests include RT-PCR  and TMA. This test has not been FDA cleared or approved. This test has been authorized by FDA under an Emergency Use Authorization (EUA). This test is only authorized for the duration of time the declaration that circumstances exist justifying the authorization of the emergency use of in vitro diagnostic tests for detection of SARS-CoV-2 virus and/or diagnosis of COVID-19 infection under section 564(b)(1) of the Act, 21 U.S.C. 967RFF-6(B) (1), unless the authorization is terminated or revoked sooner. When diagnostic testing is negative, the possibility of a false negative result should be considered in the context of a patient's recent exposures and the presence of clinical signs and symptoms consistent with COVID-19. An individual without symptoms of COVID-19 and who is not shedding SARS-CoV-2 virus wo                          uld expect to have a negative (not detected) result in this assay.     Assessment:  Heather Bell is a 44 y.o. female with clinical T1cN1triple negativerightbreast cancers/p ultrasound guided core biopsy. Pathology revealeda grade IIIinvasive mammary carcinoma, no special type of the right breast. Specimen was 10 mm.Ductal carcinoma in situ(DCIS)was not identified.Right axillarylymph nodewas positive for metastatic carcinoma.Tumor was ER - (<1%), PR - (<1%), and Her2/neu -.  Bilateral mammogram and right unilateral ultrasoundon 07/10/2019 revealed a 1.7 x 1.6 x 1.6 cmmass at the7 o'clock region of the right breast 7 cm from the nipple. There was an abnormal1.4 x 0.7 x 0.9 cmaxillary lymph node. Therewas a second3 mmaxillary lymph node with minimal focal cortical thickening.An ultrasound-guided core biopsies of the mass and the enlarged right axillary lymph node was recommended. BI-RADS category 5.  PET scanon 08/11/2019  showed a hypermetabolic mass in the inferior RIGHT breast consistentwith herbreast carcinoma. There was noevidence hypermetabolic nodal metastasis.There was no distant metastatic disease.There was nopulmonary metastasis or skeletal metastasis.  Echoon 12/24/2020revealedan ejection fraction of50 to 55%. The left ventricle demonstrated global hypokinesis.MUGAon 08/24/2019 revealed an EF of 60%.She is followed by Dr End.  She is s/p week #3 of Taxol + carboplatin(08/25/2019 - 09/08/2019).   Breast mass was larger after initiation of treatment felt secondary to post biopsy hematoma.  Right breast ultrasound on 09/07/2019 revealed the mass in the 7 o'clock position contained cystic components and measured 3 cm (compared to 1.7 cm on 07/10/2019).  The biopsied lymph node with 3 mm cortical thickening in the axilla was similar.  She is premenopausal.  She stopped taking birth control pills on 08/17/2019.  She has iron deficiency.  Ferritin was 21 with an iron saturation of 7% and a TIBC 343 on 09/08/2019.   She is intolerant of oral iron.  She received Venofer on 09/18/2019.  She has B12 deficiency.  B12 was 240 on 09/08/2019.  Symptomatically, she feels better.  She is orthostatic today.  Fluid intake is minimal.  Breast mass is smaller.  Plan: 1.   Labs today: CBC with diff, CMP, magnesium, urine pregnancy test. 2.Clinical stageIIB triple negativeright breast cancer PET scan- no evidence of metastatic disease. Patient undergoingneoadjuvant chemotherapy. Weekly Taxol + carboplatin x 12 followed by AC x 4 with Neulasta support. She is s/p week #3 Taxol and carboplatin. She has had some nausea controlled by ondansetron and Ativan. Breast mass appears slightly smaller today. Mass was 1.7 cm by ultrasound on 07/10/2019. Mass s/p  biopsy with hematoma was 3.5 cm by exam on 07/30/2019. Mass was 5.5 x 6.0 cm on 09/01/2019  and 5.0 x 5.5 cm on 09/08/2019.   Mass was 4.5 x 4.5 cm on 09/15/2019 and 2.8 x 3.0 today. Hematoma is resolving and mass is smaller. Continue weekly measurements.  Anticipate follow-up ultrasound after 6 weeks then 12 weeks of Taxol + carboplatin. Labs reviewed.  Proceed with week #4 Taxol and carboplatin today. 3.Dehydration  Patient orthostatic today.  Discuss importance of good hydration.  IVF 500 cc/hr x 1 hour today.  Recheck orthostatics.  Check AM cortisol level today to r/o adrenal insufficiency. 4.   Nausea             No issues today.  Encourage hydration.  Discuss symptom management.  She has antiemetics at home to use on a prn bases.  Interventions are adequate.    5.   Constipation Discuss management of bowel movements. 6. Iron deficiency anemia             She is intolerant of oral iron.  She received Venofer on 09/18/2019.  Anticipate weekly Venofer x 2 (next dose in 1 week). 7.   RTC in 1 week for MD assessment, labs (CBC with differ, CMP, Mg, urine pregnancy test), Venofer and week #5 Taxol and carboplatin.  I discussed the assessment and treatment plan with the patient.  The patient was provided an opportunity to ask questions and all were answered.  The patient agreed with the plan and demonstrated an understanding of the instructions.  The patient was advised to call back if the symptoms worsen or if the condition fails to improve as anticipated.   Lequita Asal, MD, PhD    09/22/2019, 9:41 AM  I, Selena Batten, am acting as scribe for [employer]. Mike Gip, MD, PhD.  I, Jennipher Weatherholtz C. Mike Gip, MD, have reviewed the above documentation for accuracy and completeness, and I agree with the above.

## 2019-09-22 ENCOUNTER — Inpatient Hospital Stay: Payer: BC Managed Care – PPO

## 2019-09-22 ENCOUNTER — Inpatient Hospital Stay: Payer: BC Managed Care – PPO | Attending: Hematology and Oncology | Admitting: Hematology and Oncology

## 2019-09-22 ENCOUNTER — Encounter: Payer: Self-pay | Admitting: Hematology and Oncology

## 2019-09-22 VITALS — BP 96/63 | HR 93 | Temp 98.2°F | Resp 18 | Ht 66.0 in | Wt 137.3 lb

## 2019-09-22 VITALS — BP 112/72 | HR 93 | Temp 99.0°F | Resp 18

## 2019-09-22 DIAGNOSIS — E876 Hypokalemia: Secondary | ICD-10-CM | POA: Insufficient documentation

## 2019-09-22 DIAGNOSIS — E2740 Unspecified adrenocortical insufficiency: Secondary | ICD-10-CM | POA: Diagnosis not present

## 2019-09-22 DIAGNOSIS — R110 Nausea: Secondary | ICD-10-CM | POA: Insufficient documentation

## 2019-09-22 DIAGNOSIS — Z7984 Long term (current) use of oral hypoglycemic drugs: Secondary | ICD-10-CM | POA: Diagnosis not present

## 2019-09-22 DIAGNOSIS — F419 Anxiety disorder, unspecified: Secondary | ICD-10-CM | POA: Insufficient documentation

## 2019-09-22 DIAGNOSIS — Z5111 Encounter for antineoplastic chemotherapy: Secondary | ICD-10-CM | POA: Diagnosis not present

## 2019-09-22 DIAGNOSIS — R7989 Other specified abnormal findings of blood chemistry: Secondary | ICD-10-CM

## 2019-09-22 DIAGNOSIS — R5383 Other fatigue: Secondary | ICD-10-CM | POA: Insufficient documentation

## 2019-09-22 DIAGNOSIS — Z7951 Long term (current) use of inhaled steroids: Secondary | ICD-10-CM | POA: Insufficient documentation

## 2019-09-22 DIAGNOSIS — E785 Hyperlipidemia, unspecified: Secondary | ICD-10-CM | POA: Diagnosis not present

## 2019-09-22 DIAGNOSIS — E119 Type 2 diabetes mellitus without complications: Secondary | ICD-10-CM | POA: Insufficient documentation

## 2019-09-22 DIAGNOSIS — K5900 Constipation, unspecified: Secondary | ICD-10-CM | POA: Insufficient documentation

## 2019-09-22 DIAGNOSIS — C50919 Malignant neoplasm of unspecified site of unspecified female breast: Secondary | ICD-10-CM

## 2019-09-22 DIAGNOSIS — Z801 Family history of malignant neoplasm of trachea, bronchus and lung: Secondary | ICD-10-CM | POA: Diagnosis not present

## 2019-09-22 DIAGNOSIS — F319 Bipolar disorder, unspecified: Secondary | ICD-10-CM | POA: Diagnosis not present

## 2019-09-22 DIAGNOSIS — D509 Iron deficiency anemia, unspecified: Secondary | ICD-10-CM | POA: Diagnosis not present

## 2019-09-22 DIAGNOSIS — Z171 Estrogen receptor negative status [ER-]: Secondary | ICD-10-CM | POA: Insufficient documentation

## 2019-09-22 DIAGNOSIS — K219 Gastro-esophageal reflux disease without esophagitis: Secondary | ICD-10-CM | POA: Insufficient documentation

## 2019-09-22 DIAGNOSIS — Z791 Long term (current) use of non-steroidal anti-inflammatories (NSAID): Secondary | ICD-10-CM | POA: Insufficient documentation

## 2019-09-22 DIAGNOSIS — E538 Deficiency of other specified B group vitamins: Secondary | ICD-10-CM | POA: Diagnosis not present

## 2019-09-22 DIAGNOSIS — E860 Dehydration: Secondary | ICD-10-CM | POA: Diagnosis not present

## 2019-09-22 DIAGNOSIS — Z79899 Other long term (current) drug therapy: Secondary | ICD-10-CM | POA: Diagnosis not present

## 2019-09-22 DIAGNOSIS — Z3202 Encounter for pregnancy test, result negative: Secondary | ICD-10-CM | POA: Insufficient documentation

## 2019-09-22 DIAGNOSIS — I951 Orthostatic hypotension: Secondary | ICD-10-CM | POA: Diagnosis not present

## 2019-09-22 DIAGNOSIS — C50911 Malignant neoplasm of unspecified site of right female breast: Secondary | ICD-10-CM | POA: Diagnosis present

## 2019-09-22 LAB — CBC WITH DIFFERENTIAL/PLATELET
Abs Immature Granulocytes: 0.01 10*3/uL (ref 0.00–0.07)
Basophils Absolute: 0.1 10*3/uL (ref 0.0–0.1)
Basophils Relative: 1 %
Eosinophils Absolute: 0.1 10*3/uL (ref 0.0–0.5)
Eosinophils Relative: 2 %
HCT: 33.3 % — ABNORMAL LOW (ref 36.0–46.0)
Hemoglobin: 10.7 g/dL — ABNORMAL LOW (ref 12.0–15.0)
Immature Granulocytes: 0 %
Lymphocytes Relative: 41 %
Lymphs Abs: 2.1 10*3/uL (ref 0.7–4.0)
MCH: 27.6 pg (ref 26.0–34.0)
MCHC: 32.1 g/dL (ref 30.0–36.0)
MCV: 86 fL (ref 80.0–100.0)
Monocytes Absolute: 0.5 10*3/uL (ref 0.1–1.0)
Monocytes Relative: 9 %
Neutro Abs: 2.4 10*3/uL (ref 1.7–7.7)
Neutrophils Relative %: 47 %
Platelets: 275 10*3/uL (ref 150–400)
RBC: 3.87 MIL/uL (ref 3.87–5.11)
RDW: 14.5 % (ref 11.5–15.5)
WBC: 5.1 10*3/uL (ref 4.0–10.5)
nRBC: 0 % (ref 0.0–0.2)

## 2019-09-22 LAB — COMPREHENSIVE METABOLIC PANEL
ALT: 10 U/L (ref 0–44)
AST: 14 U/L — ABNORMAL LOW (ref 15–41)
Albumin: 4 g/dL (ref 3.5–5.0)
Alkaline Phosphatase: 52 U/L (ref 38–126)
Anion gap: 5 (ref 5–15)
BUN: 13 mg/dL (ref 6–20)
CO2: 23 mmol/L (ref 22–32)
Calcium: 8.9 mg/dL (ref 8.9–10.3)
Chloride: 108 mmol/L (ref 98–111)
Creatinine, Ser: 0.7 mg/dL (ref 0.44–1.00)
GFR calc Af Amer: >60 mL/min (ref >60)
GFR calc non Af Amer: >60 mL/min (ref >60)
Glucose, Bld: 96 mg/dL (ref 70–99)
Potassium: 3.7 mmol/L (ref 3.5–5.1)
Sodium: 136 mmol/L (ref 135–145)
Total Bilirubin: 0.3 mg/dL (ref 0.3–1.2)
Total Protein: 7.1 g/dL (ref 6.5–8.1)

## 2019-09-22 LAB — CORTISOL: Cortisol, Plasma: 5.3 ug/dL

## 2019-09-22 LAB — PREGNANCY, URINE: Preg Test, Ur: NEGATIVE

## 2019-09-22 LAB — MAGNESIUM: Magnesium: 2.1 mg/dL (ref 1.7–2.4)

## 2019-09-22 MED ORDER — SODIUM CHLORIDE 0.9 % IV SOLN
80.0000 mg/m2 | Freq: Once | INTRAVENOUS | Status: AC
  Administered 2019-09-22: 132 mg via INTRAVENOUS
  Filled 2019-09-22: qty 22

## 2019-09-22 MED ORDER — SODIUM CHLORIDE 0.9 % IV SOLN
Freq: Once | INTRAVENOUS | Status: AC
  Filled 2019-09-22: qty 250

## 2019-09-22 MED ORDER — HEPARIN SOD (PORK) LOCK FLUSH 100 UNIT/ML IV SOLN
500.0000 [IU] | Freq: Once | INTRAVENOUS | Status: AC
  Administered 2019-09-22: 500 [IU] via INTRAVENOUS
  Filled 2019-09-22: qty 5

## 2019-09-22 MED ORDER — PALONOSETRON HCL INJECTION 0.25 MG/5ML
0.2500 mg | Freq: Once | INTRAVENOUS | Status: AC
  Administered 2019-09-22: 0.25 mg via INTRAVENOUS

## 2019-09-22 MED ORDER — SODIUM CHLORIDE 0.9% FLUSH
10.0000 mL | INTRAVENOUS | Status: DC | PRN
  Administered 2019-09-22: 10 mL via INTRAVENOUS
  Filled 2019-09-22: qty 10

## 2019-09-22 MED ORDER — SODIUM CHLORIDE 0.9 % IV SOLN
221.6000 mg | Freq: Once | INTRAVENOUS | Status: AC
  Administered 2019-09-22: 220 mg via INTRAVENOUS
  Filled 2019-09-22 (×2): qty 22

## 2019-09-22 MED ORDER — FAMOTIDINE IN NACL 20-0.9 MG/50ML-% IV SOLN
20.0000 mg | Freq: Once | INTRAVENOUS | Status: AC
  Administered 2019-09-22: 20 mg via INTRAVENOUS

## 2019-09-22 MED ORDER — SODIUM CHLORIDE 0.9 % IV SOLN
10.0000 mg | Freq: Once | INTRAVENOUS | Status: AC
  Administered 2019-09-22: 10 mg via INTRAVENOUS
  Filled 2019-09-22: qty 10

## 2019-09-22 MED ORDER — DIPHENHYDRAMINE HCL 50 MG/ML IJ SOLN
50.0000 mg | Freq: Once | INTRAMUSCULAR | Status: AC
  Administered 2019-09-22: 50 mg via INTRAVENOUS

## 2019-09-22 NOTE — Progress Notes (Signed)
The patient b/p is running low today. Orthostatics was done 96/63 sitting p 93, 77/49 standing p 103. The patient does not report any lightheaded or dizziness today.

## 2019-09-24 ENCOUNTER — Telehealth: Payer: Self-pay

## 2019-09-24 NOTE — Telephone Encounter (Signed)
New patient information with labs has been faxed to Dr Gabriel Carina / faxed confirmation confirmed [phone]. Per Dr Mike Gip she has spoken with the provider and they have agreed to see the patient 1-2 weeks out. The patient was also in agreement.

## 2019-09-25 ENCOUNTER — Telehealth: Payer: Self-pay | Admitting: *Deleted

## 2019-09-25 NOTE — Telephone Encounter (Signed)
RN made a follow up phone call to check on patient after her chemotherapy treatment on Tuesday of this week. She states," I am doing better this time". Patient denies numbness or tingling. Denies N/V. Denies diarrhea or constipation. Doesn't feel unusually weak. She states she is doing better drinking liquids to prevent dehydration. Reinforced calling the main cancer center phone number anytime she has any problems or concerns as there is always an MD on Call nights and weekends  When the clinic is closed.

## 2019-09-28 ENCOUNTER — Other Ambulatory Visit: Payer: Self-pay

## 2019-09-28 DIAGNOSIS — C50511 Malignant neoplasm of lower-outer quadrant of right female breast: Secondary | ICD-10-CM | POA: Insufficient documentation

## 2019-09-28 DIAGNOSIS — C50919 Malignant neoplasm of unspecified site of unspecified female breast: Secondary | ICD-10-CM | POA: Insufficient documentation

## 2019-09-28 NOTE — Progress Notes (Signed)
[facility]  [address] [place], [place] [address] Phone: [phone]  Fax: [phone]   Clinic Day:  09/29/2019  Referring physician: [place], [place]  Chief Complaint: Heather Bell is a 44 y.o. female with clinical stage IIBtriple negativerightbreast cancer who is seen for assessment prior to week #5 of Taxol + carboplatin.   HPI: The patient was last seen in the medical oncology clinic on 09/22/2019. At that time, she was doing well.  Nausea and diarrhea had resolved.  Fluid intake remained slight.  Blood pressure was 96/63 (sitting) and 77/49 (standing).  Breast mass was smaller.  Sodium was 136 and potassium 3.7.  Cortisol was 5.3 (low). She received IVF and Venofer.  She received and week #5 Taxol + carboplatin.   Endocrinology was contacted regarding a concern for adrenal insufficiency.  She was seen in consultation by Dr Lavone Orn, endocrinologist, in the [facility] on 09/28/2019.  She had a high suspicion for secondary adrenal insufficiency given her low cortisol level and recurrent weekly Decadron.  Symptomatic given her lightheadedness.  Plan was for labs on 09/29/2019 for 8 AM with a repeat cortisol as well as TSH and ACTH.   She will be contacted when results are back regarding starting hydrocortisone to treat her adrenal insufficiency and if needed an eventual taper after she has completed chemotherapy.  During the interim, she has felt "lightheaded". She reports feeling lightheaded yesterday and this morning with mild improvement. She is drinking up to 24 ounces of water everyday. She notes one day she drank 40 oz of water, another she drank 48 oz, and a few days ago she drank 32 oz of water.   She reports nausea on 09/25/2019 and 09/26/2019, for which she took ondansetron for relief. She reports eating about the same. She is unable to finish her meals sometimes. She reports hard stool 1 week ago. The lump on her right  breast feels smaller and she denies any tenderness. She continues to have left shoulder pain. She denies any numbness and tingling.  Her blood sugar has remained in the upper 80's and mid 90's.   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   Past Medical History:  Diagnosis Date  . Allergic rhinitis   . Anxiety   . Bipolar disorder ([place])    depression  . Cancer [facility])    breast right  . Depression   . Diabetes ([place])   . GERD (gastroesophageal reflux disease)   . Hyperlipidemia     Past Surgical History:  Procedure Laterality Date  . BREAST BIOPSY Right 07/22/2019   mass bx, path pending, heart marker  . BREAST BIOPSY Right 07/22/2019   LN bx, path pending,  butterfly hydromarker  . IMAGE GUIDED SINUS SURGERY    . KNEE SURGERY    . NASAL SEPTUM SURGERY    . PORTACATH PLACEMENT N/A 08/07/2019   Procedure: INSERTION PORT-A-CATH;  Surgeon: Herbert Pun, MD;  Location: [facility] ORS;  Service: General;  Laterality: N/A;  . TONSILLECTOMY      Family History  Problem Relation Age of Onset  . Lung cancer Father   . Heart attack Father   . Lung cancer Paternal Uncle   . Heart disease Paternal Uncle   . Lung cancer Paternal Aunt     Social History:  reports that she has never smoked. She has never used smokeless tobacco. She reports that she  does not drink alcohol or use drugs. Denies any exposure to radiation or toxins.She has 2 children (42 year old daughter and 51 year old son).She use to work inthefast foodindustry. She is currently not working. Her husbandisMichael.[name] travels a lot for his job. The patient is alone today.  Allergies:  Allergies  Allergen Reactions  . Compazine [Prochlorperazine Edisylate] Other (See Comments)    Acute dystonic reaction  . Morphine And Related Shortness Of Breath  . Doxycycline Other (See Comments)    "made all my  symptoms worse"   . Fluticasone Other (See Comments)    Doesn't work  . Levofloxacin Other (See Comments)    "made all my symptoms worse"     Current Medications: Current Outpatient Medications  Medication Sig Dispense Refill  . amphetamine-dextroamphetamine (ADDERALL XR) 25 MG 24 hr capsule Take 25 mg by mouth daily.    . Calcium Carbonate-Vitamin D (CALCIUM 500 + D) 500-125 MG-UNIT TABS Take 1 tablet by mouth daily.    . cetirizine (ZYRTEC) 10 MG tablet Take 10 mg by mouth daily.     . clonazePAM (KLONOPIN) 1 MG tablet Take 1 mg by mouth 3 (three) times daily as needed for anxiety.    . docusate sodium (COLACE) 100 MG capsule Take 100 mg by mouth 2 (two) times daily.    Marland Kitchen doxepin (SINEQUAN) 25 MG capsule Take 25 mg by mouth at bedtime.     Marland Kitchen ibuprofen (ADVIL) 200 MG tablet Take 400-800 mg by mouth every 6 (six) hours as needed for headache or moderate pain.    Marland Kitchen lamoTRIgine (LAMICTAL) 200 MG tablet Take 200 mg by mouth at bedtime.     . lidocaine-prilocaine (EMLA) cream Apply topically over port-a-cath 30 minutes to 1 hour prior to treatment. 30 g 0  . LORazepam (ATIVAN) 0.5 MG tablet Take 1 tablet (0.5 mg total) by mouth every 6 (six) hours as needed (Nausea or vomiting). 20 tablet 0  . lurasidone (LATUDA) 40 MG TABS tablet Take 40 mg by mouth at bedtime. Take with 60 mg to equal 100 mg at night    . Lurasidone HCl 60 MG TABS Take 60 mg by mouth at bedtime. Take with 40 mg to equal 100 mg at night    . metFORMIN (GLUCOPHAGE-XR) 500 MG 24 hr tablet Take 1,000 mg by mouth 2 (two) times daily.    . mometasone (NASONEX) 50 MCG/ACT nasal spray Place 2 sprays into the nose daily as needed (allergies).     . Multiple Vitamin (MULTIVITAMIN WITH MINERALS) TABS tablet Take 1 tablet by mouth daily.    Marland Kitchen omeprazole (PRILOSEC) 20 MG capsule Take 20 mg by mouth daily.     . ondansetron (ZOFRAN) 8 MG tablet Take 1 tablet (8 mg total) by mouth 2 (two) times daily as needed for refractory nausea /  vomiting. Start on day 3 after carboplatin chemo. 30 tablet 1  . QUEtiapine (SEROQUEL XR) 300 MG 24 hr tablet Take 300 mg by mouth at bedtime.    . simvastatin (ZOCOR) 20 MG tablet Take 20 mg by mouth daily.    Marland Kitchen topiramate (TOPAMAX) 100 MG tablet Take 50-100 mg by mouth See admin instructions. Take 100 mg in the morning and 50 mg at night    . vitamin B-12 (CYANOCOBALAMIN) 1000 MCG tablet Take 1,000 mcg by mouth daily.     No current facility-administered medications for this visit.   Facility-Administered Medications Ordered in Other Visits  Medication Dose Route Frequency Provider Last  Rate Last Admin  . heparin lock flush 100 unit/mL  500 Units Intravenous Once Tatem Fesler C, MD      . sodium chloride flush (NS) 0.9 % injection 10 mL  10 mL Intravenous PRN Lequita Asal, MD   10 mL at 09/29/19 5809    Review of Systems  Constitutional: Positive for weight loss (3 pounds). Negative for chills, diaphoresis, fever and malaise/fatigue.       Feels "lightheaded".  HENT: Negative for congestion, hearing loss, nosebleeds, sinus pain and sore throat.   Eyes: Negative for blurred vision.  Respiratory: Negative for cough, hemoptysis, sputum production and shortness of breath.   Cardiovascular: Negative for chest pain (right breast tenderness; resolved), palpitations and leg swelling.  Gastrointestinal: Positive for nausea (on 09/25/2019 and 09/26/2019). Negative for abdominal pain, blood in stool, constipation, diarrhea, heartburn (reflux; on antacids), melena and vomiting.       Eating well and unable to finish meals at times. Drinking 24 oz of water a day. Hard stool 1 week ago.  Genitourinary: Negative for dysuria, frequency, hematuria and urgency.       Premenopausal.  Musculoskeletal: Positive for joint pain (left shoulder). Negative for back pain, myalgias and neck pain.  Skin: Negative for itching and rash.       Right breast lump is smaller.  Neurological: Negative for  dizziness, tingling, sensory change, weakness and headaches.       Lightheaded upon standing.  Endo/Heme/Allergies: Does not bruise/bleed easily.       Type II diabetes. Blood sugar in the upper 80's and mid 90's.  Psychiatric/Behavioral: Negative for depression and memory loss. The patient is not nervous/anxious and does not have insomnia.   All other systems reviewed and are negative.   Performance status (ECOG): 1  Vitals Blood pressure 102/61, pulse (!) 111, temperature 98.3 F (36.8 C), temperature source Oral, resp. rate 18, weight 134 lb 7.7 oz (61 kg), SpO2 99 %.   Physical Exam  Constitutional: She is oriented to person, place, and time. She appears well-developed and well-nourished. No distress.  HENT:  Head: Normocephalic and atraumatic.  Mouth/Throat: Oropharynx is clear and moist. Mucous membranes are dry. No oropharyngeal exudate.  Shoulder length brown wig. Mask.  Eyes: Pupils are equal, round, and reactive to light. Conjunctivae and EOM are normal. No scleral icterus.  Brown eyes.  Cardiovascular: Normal rate, regular rhythm and normal heart sounds.  No murmur heard. Pulmonary/Chest: Effort normal and breath sounds normal. No respiratory distress. She has no wheezes. She has no rales. She exhibits no tenderness.  RIGHT breast mass is flatter 2.5 x 2.3 cm (previously 2.8 x 3.0 cm).  Abdominal: Soft. Bowel sounds are normal. She exhibits no distension and no mass. There is no abdominal tenderness. There is no rebound and no guarding.  Musculoskeletal:        General: No tenderness or edema. Normal range of motion.     Cervical back: Normal range of motion and neck supple.  Lymphadenopathy:    She has no cervical adenopathy.    She has no axillary adenopathy.       Right: No supraclavicular adenopathy present.       Left: No supraclavicular adenopathy present.  Neurological: She is alert and oriented to person, place, and time.  Skin: Skin is warm and dry. She is not  diaphoretic.  Psychiatric: She has a normal mood and affect. Her behavior is normal. Judgment and thought content normal.  Nursing note and vitals reviewed.  Infusion on 09/29/2019  Component Date Value Ref Range Status  . Magnesium 09/29/2019 1.9  1.7 - 2.4 mg/dL Final   Performed at [facility], [address], [place], [place] [address]  . WBC 09/29/2019 4.3  4.0 - 10.5 K/uL Final  . RBC 09/29/2019 4.02  3.87 - 5.11 MIL/uL Final  . Hemoglobin 09/29/2019 11.0* 12.0 - 15.0 g/dL Final  . HCT 09/29/2019 34.3* 36.0 - 46.0 % Final  . MCV 09/29/2019 85.3  80.0 - 100.0 fL Final  . MCH 09/29/2019 27.4  26.0 - 34.0 pg Final  . MCHC 09/29/2019 32.1  30.0 - 36.0 g/dL Final  . RDW 09/29/2019 14.2  11.5 - 15.5 % Final  . Platelets 09/29/2019 199  150 - 400 K/uL Final  . nRBC 09/29/2019 0.0  0.0 - 0.2 % Final  . Neutrophils Relative % 09/29/2019 44  % Final  . Neutro Abs 09/29/2019 1.9  1.7 - 7.7 K/uL Final  . Lymphocytes Relative 09/29/2019 45  % Final  . Lymphs Abs 09/29/2019 2.0  0.7 - 4.0 K/uL Final  . Monocytes Relative 09/29/2019 6  % Final  . Monocytes Absolute 09/29/2019 0.3  0.1 - 1.0 K/uL Final  . Eosinophils Relative 09/29/2019 3  % Final  . Eosinophils Absolute 09/29/2019 0.1  0.0 - 0.5 K/uL Final  . Basophils Relative 09/29/2019 1  % Final  . Basophils Absolute 09/29/2019 0.0  0.0 - 0.1 K/uL Final  . Immature Granulocytes 09/29/2019 1  % Final  . Abs Immature Granulocytes 09/29/2019 0.02  0.00 - 0.07 K/uL Final   Performed at [facility], [address], [place], [place] [address]  . Sodium 09/29/2019 137  135 - 145 mmol/L Final  . Potassium 09/29/2019 3.4* 3.5 - 5.1 mmol/L Final  . Chloride 09/29/2019 106  98 - 111 mmol/L Final  . CO2 09/29/2019 22  22 - 32 mmol/L Final  . Glucose, Bld 09/29/2019 107* 70 - 99 mg/dL Final  . BUN 09/29/2019 16  6 - 20 mg/dL Final  . Creatinine, Ser 09/29/2019 0.75  0.44 - 1.00 mg/dL Final  . Calcium 09/29/2019 9.2   8.9 - 10.3 mg/dL Final  . Total Protein 09/29/2019 7.1  6.5 - 8.1 g/dL Final  . Albumin 09/29/2019 4.1  3.5 - 5.0 g/dL Final  . AST 09/29/2019 15  15 - 41 U/L Final  . ALT 09/29/2019 13  0 - 44 U/L Final  . Alkaline Phosphatase 09/29/2019 50  38 - 126 U/L Final  . Total Bilirubin 09/29/2019 0.7  0.3 - 1.2 mg/dL Final  . GFR calc non Af Amer 09/29/2019 >60  >60 mL/min Final  . GFR calc Af Amer 09/29/2019 >60  >60 mL/min Final  . Anion gap 09/29/2019 9  5 - 15 Final   Performed at [facility], [address] [place], [place] [address]  . Preg Test, Ur 09/29/2019 NEGATIVE  NEGATIVE Final   Performed at [facility], [address] [place], [place] [address]    Assessment:  MONSERRATT KNEZEVIC is a 44 y.o. female with clinical T1cN1triple negativerightbreast cancers/p ultrasound guided core biopsy. Pathology revealeda grade IIIinvasive mammary carcinoma, no special type of the right breast. Specimen was 10 mm.Ductal carcinoma in situ(DCIS)was not identified.Right axillarylymph nodewas positive for metastatic carcinoma.Tumor was ER - (<1%), PR - (<1%), and Her2/neu -.  Bilateral mammogram and right unilateral ultrasoundon 07/10/2019 revealed a 1.7 x 1.6 x 1.6 cmmass at the7 o'clock  region of the right breast 7 cm from the nipple. There was an abnormal1.4 x 0.7 x 0.9 cmaxillary lymph node. Therewas a second3 mmaxillary lymph node with minimal focal cortical thickening.An ultrasound-guided core biopsies of the mass and the enlarged right axillary lymph node was recommended. BI-RADS category 5.  PET scanon 08/11/2019 showed a hypermetabolic mass in the inferior RIGHT breast consistentwith herbreast carcinoma. There was noevidence hypermetabolic nodal metastasis.There was no distant metastatic disease.There was nopulmonary metastasis or skeletal metastasis.  Echoon 12/24/2020revealedan ejection fraction of50 to 55%. The left ventricle  demonstrated global hypokinesis.MUGAon 08/24/2019 revealed an EF of 60%.She is followed by Dr End.  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  She is s/p week #4of Taxol + carboplatin(08/25/2019 - 09/22/2019).   Breast masswas larger after initiation of treatment felt secondary to post biopsy hematoma. Right breast ultrasoundon 09/07/2019 revealed the mass in the 7 o'clock position contained cystic components and measured 3 cm (compared to 1.7 cm on 07/10/2019). The biopsied lymph node with 3 mm cortical thickening in the axilla was similar.  She is premenopausal. She stopped taking birth control pills on 08/17/2019.  She has iron deficiency. Ferritin was 21 with an iron saturation of 7% and a TIBC 343 on 09/08/2019.  She received Venofer on 09/18/2019.  She has B12 deficiency. B12 was 240 on 09/08/2019.  She has recurrent orthostatic hypotension.  Fluid intake is marginal.  AM cortisol was 5.3 (low) on 09/22/2019 suggestive of adrenal insufficiency.  Symptomatically, she has felt "light headed" at times.  Nausea is well managed.  Appetite is down; she has lost 3 pounds.  Breast mass is smaller.  Plan: 1.   Labs today: CBC with diff, CMP, Mg, urine pregnancy test. 2.Clinical stageIIB triple negativeright breast cancer PET scan- no evidence of metastatic disease. Patient undergoingneoadjuvant chemotherapy. Weekly Taxol + carboplatin x 12 followed by AC x 4 with Neulasta support. She is s/p week #4Taxol and carboplatin. Nausea is controlled by ondansetron and Ativan.   Nausea is likely exacerbated by dehydration. Breast mass is smaller. Mass was 1.7 cm by ultrasound on 07/10/2019. Mass s/p biopsy with hematoma was 3.5  cm by exam on 07/30/2019. Mass was 5.5 x 6.0 cm on 09/01/2019 and 5.0 x 5.5 cm on 09/08/2019. Mass was 4.5 x 4.5 cm on 09/15/2019 and 2.8 x 3.0 cm on 09/22/2019.   Mass is 2.5 x 2.3 cm today.   Hematoma and mass appear to be resolving. Continue weekly monitoring until next ultrasound.  Anticipate follow-up ultrasound after 6 weeks then 12 weeks of Taxol + carboplatin. Labs reviewed.  Proceed with week #5 Taxol and carboplatin.  Review [facility] genetic testing - variant of uncertain significance. 3.Orthostasis  Suspect combination of minimal fluid intake and adrenal insufficiency.  IVF 500 cc then repeat orthostatics. 4. Iron deficiency anemia Patient denies any bleeding. Diet is fair. She is intolerant of oral iron.  She received Venofer on 09/18/2019.  Venofer today and in 1 week. 5. Low cortisol  Cortisol low on 09/22/2019.  Patient is undergoing evaluation by endocrinology to r/o adrenal insufficiency.  Suspect patient does well initially after treatment secondary to Decadron premedication. 6.   Hypokalemia  Potassium 3.4 today.  Rx potassium 10 meq po q day (dis: #30). 7.   RTC in 1 week for MD assessment, labs (CBC with diff, CMP, Mg, urine pregnancy test), Venofer, and week #6 Taxol and carboplatin.  I discussed the assessment  and treatment plan with the patient.  The patient was provided an opportunity to ask questions and all were answered.  The patient agreed with the plan and demonstrated an understanding of the instructions.  The patient was advised to call back if the symptoms worsen or if the condition fails to improve as anticipated.   Lequita Asal, MD, PhD    09/29/2019, 9:09 AM  I, Selena Batten, am acting as scribe for [employer]. Mike Gip, MD, PhD.  I, Daniya Aramburo C. Mike Gip, MD, have reviewed the above  documentation for accuracy and completeness, and I agree with the above.

## 2019-09-28 NOTE — Progress Notes (Signed)
Confirmed Name and DOB. Reports lightheadedness with position changes today.

## 2019-09-29 ENCOUNTER — Inpatient Hospital Stay: Payer: BC Managed Care – PPO

## 2019-09-29 ENCOUNTER — Inpatient Hospital Stay (HOSPITAL_BASED_OUTPATIENT_CLINIC_OR_DEPARTMENT_OTHER): Payer: BC Managed Care – PPO | Admitting: Hematology and Oncology

## 2019-09-29 VITALS — BP 90/60 | HR 90 | Resp 18

## 2019-09-29 VITALS — BP 102/61 | HR 111 | Temp 98.3°F | Resp 18 | Wt 134.5 lb

## 2019-09-29 DIAGNOSIS — C50919 Malignant neoplasm of unspecified site of unspecified female breast: Secondary | ICD-10-CM

## 2019-09-29 DIAGNOSIS — E538 Deficiency of other specified B group vitamins: Secondary | ICD-10-CM | POA: Diagnosis not present

## 2019-09-29 DIAGNOSIS — R7989 Other specified abnormal findings of blood chemistry: Secondary | ICD-10-CM

## 2019-09-29 DIAGNOSIS — D509 Iron deficiency anemia, unspecified: Secondary | ICD-10-CM

## 2019-09-29 DIAGNOSIS — Z5111 Encounter for antineoplastic chemotherapy: Secondary | ICD-10-CM

## 2019-09-29 DIAGNOSIS — E2740 Unspecified adrenocortical insufficiency: Secondary | ICD-10-CM | POA: Diagnosis not present

## 2019-09-29 DIAGNOSIS — E876 Hypokalemia: Secondary | ICD-10-CM

## 2019-09-29 DIAGNOSIS — C50911 Malignant neoplasm of unspecified site of right female breast: Secondary | ICD-10-CM | POA: Diagnosis not present

## 2019-09-29 LAB — CBC WITH DIFFERENTIAL/PLATELET
Abs Immature Granulocytes: 0.02 10*3/uL (ref 0.00–0.07)
Basophils Absolute: 0 10*3/uL (ref 0.0–0.1)
Basophils Relative: 1 %
Eosinophils Absolute: 0.1 10*3/uL (ref 0.0–0.5)
Eosinophils Relative: 3 %
HCT: 34.3 % — ABNORMAL LOW (ref 36.0–46.0)
Hemoglobin: 11 g/dL — ABNORMAL LOW (ref 12.0–15.0)
Immature Granulocytes: 1 %
Lymphocytes Relative: 45 %
Lymphs Abs: 2 10*3/uL (ref 0.7–4.0)
MCH: 27.4 pg (ref 26.0–34.0)
MCHC: 32.1 g/dL (ref 30.0–36.0)
MCV: 85.3 fL (ref 80.0–100.0)
Monocytes Absolute: 0.3 10*3/uL (ref 0.1–1.0)
Monocytes Relative: 6 %
Neutro Abs: 1.9 10*3/uL (ref 1.7–7.7)
Neutrophils Relative %: 44 %
Platelets: 199 10*3/uL (ref 150–400)
RBC: 4.02 MIL/uL (ref 3.87–5.11)
RDW: 14.2 % (ref 11.5–15.5)
WBC: 4.3 10*3/uL (ref 4.0–10.5)
nRBC: 0 % (ref 0.0–0.2)

## 2019-09-29 LAB — COMPREHENSIVE METABOLIC PANEL
ALT: 13 U/L (ref 0–44)
AST: 15 U/L (ref 15–41)
Albumin: 4.1 g/dL (ref 3.5–5.0)
Alkaline Phosphatase: 50 U/L (ref 38–126)
Anion gap: 9 (ref 5–15)
BUN: 16 mg/dL (ref 6–20)
CO2: 22 mmol/L (ref 22–32)
Calcium: 9.2 mg/dL (ref 8.9–10.3)
Chloride: 106 mmol/L (ref 98–111)
Creatinine, Ser: 0.75 mg/dL (ref 0.44–1.00)
GFR calc Af Amer: >60 mL/min (ref >60)
GFR calc non Af Amer: >60 mL/min (ref >60)
Glucose, Bld: 107 mg/dL — ABNORMAL HIGH (ref 70–99)
Potassium: 3.4 mmol/L — ABNORMAL LOW (ref 3.5–5.1)
Sodium: 137 mmol/L (ref 135–145)
Total Bilirubin: 0.7 mg/dL (ref 0.3–1.2)
Total Protein: 7.1 g/dL (ref 6.5–8.1)

## 2019-09-29 LAB — PREGNANCY, URINE: Preg Test, Ur: NEGATIVE

## 2019-09-29 LAB — MAGNESIUM: Magnesium: 1.9 mg/dL (ref 1.7–2.4)

## 2019-09-29 MED ORDER — SODIUM CHLORIDE 0.9% FLUSH
10.0000 mL | INTRAVENOUS | Status: DC | PRN
  Administered 2019-09-29: 10 mL via INTRAVENOUS
  Filled 2019-09-29: qty 10

## 2019-09-29 MED ORDER — LIDOCAINE-PRILOCAINE 2.5-2.5 % EX CREA: TOPICAL_CREAM | CUTANEOUS | 2 refills | Status: DC

## 2019-09-29 MED ORDER — PALONOSETRON HCL INJECTION 0.25 MG/5ML
0.2500 mg | Freq: Once | INTRAVENOUS | Status: AC
  Administered 2019-09-29: 0.25 mg via INTRAVENOUS
  Filled 2019-09-29: qty 5

## 2019-09-29 MED ORDER — SODIUM CHLORIDE 0.9 % IV SOLN: 200.0000 mg | Freq: Once | INTRAVENOUS | Status: DC

## 2019-09-29 MED ORDER — SODIUM CHLORIDE 0.9 % IV SOLN
Freq: Once | INTRAVENOUS | Status: AC
  Filled 2019-09-29: qty 250

## 2019-09-29 MED ORDER — POTASSIUM CHLORIDE ER 10 MEQ PO TBCR: 10.0000 meq | EXTENDED_RELEASE_TABLET | Freq: Every day | ORAL | 0 refills | Status: DC

## 2019-09-29 MED ORDER — SODIUM CHLORIDE 0.9 % IV SOLN
10.0000 mg | Freq: Once | INTRAVENOUS | Status: AC
  Administered 2019-09-29: 10 mg via INTRAVENOUS
  Filled 2019-09-29: qty 10

## 2019-09-29 MED ORDER — FAMOTIDINE IN NACL 20-0.9 MG/50ML-% IV SOLN
20.0000 mg | Freq: Once | INTRAVENOUS | Status: AC
  Administered 2019-09-29: 12:00:00 20 mg via INTRAVENOUS
  Filled 2019-09-29: qty 50

## 2019-09-29 MED ORDER — DIPHENHYDRAMINE HCL 50 MG/ML IJ SOLN
50.0000 mg | Freq: Once | INTRAMUSCULAR | Status: AC
  Administered 2019-09-29: 12:00:00 50 mg via INTRAVENOUS
  Filled 2019-09-29: qty 1

## 2019-09-29 MED ORDER — IRON SUCROSE 20 MG/ML IV SOLN
200.0000 mg | Freq: Once | INTRAVENOUS | Status: AC
  Administered 2019-09-29: 200 mg via INTRAVENOUS

## 2019-09-29 MED ORDER — HEPARIN SOD (PORK) LOCK FLUSH 100 UNIT/ML IV SOLN
500.0000 [IU] | Freq: Once | INTRAVENOUS | Status: AC
  Administered 2019-09-29: 15:00:00 500 [IU] via INTRAVENOUS
  Filled 2019-09-29: qty 5

## 2019-09-29 MED ORDER — SODIUM CHLORIDE 0.9 % IV SOLN
221.6000 mg | Freq: Once | INTRAVENOUS | Status: AC
  Administered 2019-09-29: 14:00:00 220 mg via INTRAVENOUS
  Filled 2019-09-29: qty 22

## 2019-09-29 MED ORDER — SODIUM CHLORIDE 0.9 % IV SOLN
80.0000 mg/m2 | Freq: Once | INTRAVENOUS | Status: AC
  Administered 2019-09-29: 132 mg via INTRAVENOUS
  Filled 2019-09-29: qty 22

## 2019-09-29 NOTE — Patient Instructions (Signed)
Carboplatin injection What is this medicine? CARBOPLATIN (KAR boe pla tin) is a chemotherapy drug. It targets fast dividing cells, like cancer cells, and causes these cells to die. This medicine is used to treat ovarian cancer and many other cancers. This medicine may be used for other purposes; ask your health care provider or pharmacist if you have questions. COMMON BRAND NAME(S): Paraplatin What should I tell my health care provider before I take this medicine? They need to know if you have any of these conditions:  blood disorders  hearing problems  kidney disease  recent or ongoing radiation therapy  an unusual or allergic reaction to carboplatin, cisplatin, other chemotherapy, other medicines, foods, dyes, or preservatives  pregnant or trying to get pregnant  breast-feeding How should I use this medicine? This drug is usually given as an infusion into a vein. It is administered in a hospital or clinic by a specially trained health care professional. Talk to your pediatrician regarding the use of this medicine in children. Special care may be needed. Overdosage: If you think you have taken too much of this medicine contact a poison control center or emergency room at once. NOTE: This medicine is only for you. Do not share this medicine with others. What if I miss a dose? It is important not to miss a dose. Call your doctor or health care professional if you are unable to keep an appointment. What may interact with this medicine?  medicines for seizures  medicines to increase blood counts like filgrastim, pegfilgrastim, sargramostim  some antibiotics like amikacin, gentamicin, neomycin, streptomycin, tobramycin  vaccines Talk to your doctor or health care professional before taking any of these medicines:  acetaminophen  aspirin  ibuprofen  ketoprofen  naproxen This list may not describe all possible interactions. Give your health care provider a list of all the  medicines, herbs, non-prescription drugs, or dietary supplements you use. Also tell them if you smoke, drink alcohol, or use illegal drugs. Some items may interact with your medicine. What should I watch for while using this medicine? Your condition will be monitored carefully while you are receiving this medicine. You will need important blood work done while you are taking this medicine. This drug may make you feel generally unwell. This is not uncommon, as chemotherapy can affect healthy cells as well as cancer cells. Report any side effects. Continue your course of treatment even though you feel ill unless your doctor tells you to stop. In some cases, you may be given additional medicines to help with side effects. Follow all directions for their use. Call your doctor or health care professional for advice if you get a fever, chills or sore throat, or other symptoms of a cold or flu. Do not treat yourself. This drug decreases your body's ability to fight infections. Try to avoid being around people who are sick. This medicine may increase your risk to bruise or bleed. Call your doctor or health care professional if you notice any unusual bleeding. Be careful brushing and flossing your teeth or using a toothpick because you may get an infection or bleed more easily. If you have any dental work done, tell your dentist you are receiving this medicine. Avoid taking products that contain aspirin, acetaminophen, ibuprofen, naproxen, or ketoprofen unless instructed by your doctor. These medicines may hide a fever. Do not become pregnant while taking this medicine. Women should inform their doctor if they wish to become pregnant or think they might be pregnant. There is a  potential for serious side effects to an unborn child. Talk to your health care professional or pharmacist for more information. Do not breast-feed an infant while taking this medicine. What side effects may I notice from receiving this  medicine? Side effects that you should report to your doctor or health care professional as soon as possible:  allergic reactions like skin rash, itching or hives, swelling of the face, lips, or tongue  signs of infection - fever or chills, cough, sore throat, pain or difficulty passing urine  signs of decreased platelets or bleeding - bruising, pinpoint red spots on the skin, black, tarry stools, nosebleeds  signs of decreased red blood cells - unusually weak or tired, fainting spells, lightheadedness  breathing problems  changes in hearing  changes in vision  chest pain  high blood pressure  low blood counts - This drug may decrease the number of white blood cells, red blood cells and platelets. You may be at increased risk for infections and bleeding.  nausea and vomiting  pain, swelling, redness or irritation at the injection site  pain, tingling, numbness in the hands or feet  problems with balance, talking, walking  trouble passing urine or change in the amount of urine Side effects that usually do not require medical attention (report to your doctor or health care professional if they continue or are bothersome):  hair loss  loss of appetite  metallic taste in the mouth or changes in taste This list may not describe all possible side effects. Call your doctor for medical advice about side effects. You may report side effects to FDA at 1-800-FDA-1088. Where should I keep my medicine? This drug is given in a hospital or clinic and will not be stored at home. NOTE: This sheet is a summary. It may not cover all possible information. If you have questions about this medicine, talk to your doctor, pharmacist, or health care provider.  2020 Elsevier/Gold Standard (2007-11-11 14:38:05) Paclitaxel injection What is this medicine? PACLITAXEL (PAK li TAX el) is a chemotherapy drug. It targets fast dividing cells, like cancer cells, and causes these cells to die. This  medicine is used to treat ovarian cancer, breast cancer, lung cancer, Kaposi's sarcoma, and other cancers. This medicine may be used for other purposes; ask your health care provider or pharmacist if you have questions. COMMON BRAND NAME(S): Onxol, Taxol What should I tell my health care provider before I take this medicine? They need to know if you have any of these conditions:  history of irregular heartbeat  liver disease  low blood counts, like low white cell, platelet, or red cell counts  lung or breathing disease, like asthma  tingling of the fingers or toes, or other nerve disorder  an unusual or allergic reaction to paclitaxel, alcohol, polyoxyethylated castor oil, other chemotherapy, other medicines, foods, dyes, or preservatives  pregnant or trying to get pregnant  breast-feeding How should I use this medicine? This drug is given as an infusion into a vein. It is administered in a hospital or clinic by a specially trained health care professional. Talk to your pediatrician regarding the use of this medicine in children. Special care may be needed. Overdosage: If you think you have taken too much of this medicine contact a poison control center or emergency room at once. NOTE: This medicine is only for you. Do not share this medicine with others. What if I miss a dose? It is important not to miss your dose. Call your doctor or  health care professional if you are unable to keep an appointment. What may interact with this medicine? Do not take this medicine with any of the following medications:  disulfiram  metronidazole This medicine may also interact with the following medications:  antiviral medicines for hepatitis, HIV or AIDS  certain antibiotics like erythromycin and clarithromycin  certain medicines for fungal infections like ketoconazole and itraconazole  certain medicines for seizures like carbamazepine, phenobarbital,  phenytoin  gemfibrozil  nefazodone  rifampin  St. John's wort This list may not describe all possible interactions. Give your health care provider a list of all the medicines, herbs, non-prescription drugs, or dietary supplements you use. Also tell them if you smoke, drink alcohol, or use illegal drugs. Some items may interact with your medicine. What should I watch for while using this medicine? Your condition will be monitored carefully while you are receiving this medicine. You will need important blood work done while you are taking this medicine. This medicine can cause serious allergic reactions. To reduce your risk you will need to take other medicine(s) before treatment with this medicine. If you experience allergic reactions like skin rash, itching or hives, swelling of the face, lips, or tongue, tell your doctor or health care professional right away. In some cases, you may be given additional medicines to help with side effects. Follow all directions for their use. This drug may make you feel generally unwell. This is not uncommon, as chemotherapy can affect healthy cells as well as cancer cells. Report any side effects. Continue your course of treatment even though you feel ill unless your doctor tells you to stop. Call your doctor or health care professional for advice if you get a fever, chills or sore throat, or other symptoms of a cold or flu. Do not treat yourself. This drug decreases your body's ability to fight infections. Try to avoid being around people who are sick. This medicine may increase your risk to bruise or bleed. Call your doctor or health care professional if you notice any unusual bleeding. Be careful brushing and flossing your teeth or using a toothpick because you may get an infection or bleed more easily. If you have any dental work done, tell your dentist you are receiving this medicine. Avoid taking products that contain aspirin, acetaminophen, ibuprofen,  naproxen, or ketoprofen unless instructed by your doctor. These medicines may hide a fever. Do not become pregnant while taking this medicine. Women should inform their doctor if they wish to become pregnant or think they might be pregnant. There is a potential for serious side effects to an unborn child. Talk to your health care professional or pharmacist for more information. Do not breast-feed an infant while taking this medicine. Men are advised not to father a child while receiving this medicine. This product may contain alcohol. Ask your pharmacist or healthcare provider if this medicine contains alcohol. Be sure to tell all healthcare providers you are taking this medicine. Certain medicines, like metronidazole and disulfiram, can cause an unpleasant reaction when taken with alcohol. The reaction includes flushing, headache, nausea, vomiting, sweating, and increased thirst. The reaction can last from 30 minutes to several hours. What side effects may I notice from receiving this medicine? Side effects that you should report to your doctor or health care professional as soon as possible:  allergic reactions like skin rash, itching or hives, swelling of the face, lips, or tongue  breathing problems  changes in vision  fast, irregular heartbeat  high or  low blood pressure  mouth sores  pain, tingling, numbness in the hands or feet  signs of decreased platelets or bleeding - bruising, pinpoint red spots on the skin, black, tarry stools, blood in the urine  signs of decreased red blood cells - unusually weak or tired, feeling faint or lightheaded, falls  signs of infection - fever or chills, cough, sore throat, pain or difficulty passing urine  signs and symptoms of liver injury like dark yellow or brown urine; general ill feeling or flu-like symptoms; light-colored stools; loss of appetite; nausea; right upper belly pain; unusually weak or tired; yellowing of the eyes or  skin  swelling of the ankles, feet, hands  unusually slow heartbeat Side effects that usually do not require medical attention (report to your doctor or health care professional if they continue or are bothersome):  diarrhea  hair loss  loss of appetite  muscle or joint pain  nausea, vomiting  pain, redness, or irritation at site where injected  tiredness This list may not describe all possible side effects. Call your doctor for medical advice about side effects. You may report side effects to FDA at 1-800-FDA-1088. Where should I keep my medicine? This drug is given in a hospital or clinic and will not be stored at home. NOTE: This sheet is a summary. It may not cover all possible information. If you have questions about this medicine, talk to your doctor, pharmacist, or health care provider.  2020 Elsevier/Gold Standard (2017-04-09 13:14:55) Iron Sucrose injection What is this medicine? IRON SUCROSE (AHY ern SOO krohs) is an iron complex. Iron is used to make healthy red blood cells, which carry oxygen and nutrients throughout the body. This medicine is used to treat iron deficiency anemia in people with chronic kidney disease. This medicine may be used for other purposes; ask your health care provider or pharmacist if you have questions. COMMON BRAND NAME(S): Venofer What should I tell my health care provider before I take this medicine? They need to know if you have any of these conditions:  anemia not caused by low iron levels  heart disease  high levels of iron in the blood  kidney disease  liver disease  an unusual or allergic reaction to iron, other medicines, foods, dyes, or preservatives  pregnant or trying to get pregnant  breast-feeding How should I use this medicine? This medicine is for infusion into a vein. It is given by a health care professional in a hospital or clinic setting. Talk to your pediatrician regarding the use of this medicine in  children. While this drug may be prescribed for children as young as 2 years for selected conditions, precautions do apply. Overdosage: If you think you have taken too much of this medicine contact a poison control center or emergency room at once. NOTE: This medicine is only for you. Do not share this medicine with others. What if I miss a dose? It is important not to miss your dose. Call your doctor or health care professional if you are unable to keep an appointment. What may interact with this medicine? Do not take this medicine with any of the following medications:  deferoxamine  dimercaprol  other iron products This medicine may also interact with the following medications:  chloramphenicol  deferasirox This list may not describe all possible interactions. Give your health care provider a list of all the medicines, herbs, non-prescription drugs, or dietary supplements you use. Also tell them if you smoke, drink alcohol, or use  illegal drugs. Some items may interact with your medicine. What should I watch for while using this medicine? Visit your doctor or healthcare professional regularly. Tell your doctor or healthcare professional if your symptoms do not start to get better or if they get worse. You may need blood work done while you are taking this medicine. You may need to follow a special diet. Talk to your doctor. Foods that contain iron include: whole grains/cereals, dried fruits, beans, or peas, leafy green vegetables, and organ meats (liver, kidney). What side effects may I notice from receiving this medicine? Side effects that you should report to your doctor or health care professional as soon as possible:  allergic reactions like skin rash, itching or hives, swelling of the face, lips, or tongue  breathing problems  changes in blood pressure  cough  fast, irregular heartbeat  feeling faint or lightheaded, falls  fever or chills  flushing, sweating, or hot  feelings  joint or muscle aches/pains  seizures  swelling of the ankles or feet  unusually weak or tired Side effects that usually do not require medical attention (report to your doctor or health care professional if they continue or are bothersome):  diarrhea  feeling achy  headache  irritation at site where injected  nausea, vomiting  stomach upset  tiredness This list may not describe all possible side effects. Call your doctor for medical advice about side effects. You may report side effects to FDA at 1-800-FDA-1088. Where should I keep my medicine? This drug is given in a hospital or clinic and will not be stored at home. NOTE: This sheet is a summary. It may not cover all possible information. If you have questions about this medicine, talk to your doctor, pharmacist, or health care provider.  2020 Elsevier/Gold Standard (2011-05-17 17:14:35)

## 2019-09-29 NOTE — Progress Notes (Signed)
Patient states that she was nauseated before starting IV hydration.  Patient took her own Ativan prescribed pill.  Patient reports that she is feeling better and has less nausea.

## 2019-09-29 NOTE — Progress Notes (Signed)
Patient would like to know if her genetic testing results have come back yet. She states that the lightheadedness has improved some since yesterday/this morning.

## 2019-10-01 ENCOUNTER — Other Ambulatory Visit: Payer: Self-pay

## 2019-10-01 ENCOUNTER — Telehealth: Payer: Self-pay | Admitting: *Deleted

## 2019-10-01 NOTE — Telephone Encounter (Signed)
Called patient to check in after she received treatment this week. Patient states that she did not do a good job of hydrating yesterday but she is doing a better job today.  Advised patient to set goals on fluid intake.  Broth, ice cream, water, etc. Were suggested as fluid choices.  Patient verbalized understanding.

## 2019-10-02 ENCOUNTER — Inpatient Hospital Stay: Payer: BC Managed Care – PPO

## 2019-10-02 VITALS — BP 124/86 | HR 89 | Temp 97.3°F | Resp 18

## 2019-10-02 DIAGNOSIS — C50911 Malignant neoplasm of unspecified site of right female breast: Secondary | ICD-10-CM | POA: Diagnosis not present

## 2019-10-02 DIAGNOSIS — D509 Iron deficiency anemia, unspecified: Secondary | ICD-10-CM

## 2019-10-02 MED ORDER — SODIUM CHLORIDE 0.9 % IV SOLN
Freq: Once | INTRAVENOUS | Status: AC
  Filled 2019-10-02: qty 250

## 2019-10-02 MED ORDER — HEPARIN SOD (PORK) LOCK FLUSH 100 UNIT/ML IV SOLN
500.0000 [IU] | Freq: Once | INTRAVENOUS | Status: AC
  Administered 2019-10-02: 500 [IU] via INTRAVENOUS
  Filled 2019-10-02: qty 5

## 2019-10-02 MED ORDER — SODIUM CHLORIDE 0.9% FLUSH
10.0000 mL | INTRAVENOUS | Status: DC | PRN
  Administered 2019-10-02: 11:00:00 10 mL via INTRAVENOUS
  Filled 2019-10-02: qty 10

## 2019-10-02 NOTE — Patient Instructions (Signed)

## 2019-10-03 NOTE — Progress Notes (Signed)
[facility]  [address] [place], [place] [address] Phone: [phone]  Fax: [phone]   Clinic Day:  10/06/2019  Referring physician: [place], [place]  Chief Complaint: Heather Bell is a 44 y.o. female with clinical stage IIBtriple negativerightbreast cancer who is seen for assessment prior to week #6 of Taxol + carboplatin.   HPI: The patient was last seen in the medical oncology clinic on 09/29/2019. At that time, she notes some "lightheadedness".  She was undergoing evaluation by endocrinology for adrenal insufficiency.  Breast mass was smaller.  Hematocrit was 34.3, hemoglobin 11.0, platelets 199,000, WBC 4,300.  Potassium was 3.4. Magnesium 1.9.  She received 1 liter of NS.  She began oral potassium.  She received Venofer.  She received week #5 Taxol + carboplatin.   During the interim, she has been doing "ok".  She feels the same after her last treatment.  Her nausea is better, she only took medication to relieve her nausea 1 time last week. Her bowels are ok. She denies any bloody or black stool. She notes her appetite has not changed significantly. Her weight remains stable.   She is trying to drink more fluids. She reports that she is still not drinking enough fluids. I encouraged her to increase her fluid intake. She notes feeling tired this past week. She reports getting enough sleep. Her breast mass continues to feel smaller.  She notes her cortisol remained low at her endocrinology evaluation. She will have further studies done. She is currently not on steroids.   Patient asked to have a note signed that stated she was seen in the clinic today.    Past Medical History:  Diagnosis Date  . Allergic rhinitis   . Anxiety   . Bipolar disorder ([place])    depression  . Cancer [facility])    breast right  . Depression   . Diabetes ([place])   . GERD (gastroesophageal reflux disease)   . Hyperlipidemia     Past Surgical History:    Procedure Laterality Date  . BREAST BIOPSY Right 07/22/2019   mass bx, path pending, heart marker  . BREAST BIOPSY Right 07/22/2019   LN bx, path pending,  butterfly hydromarker  . IMAGE GUIDED SINUS SURGERY    . KNEE SURGERY    . NASAL SEPTUM SURGERY    . PORTACATH PLACEMENT N/A 08/07/2019   Procedure: INSERTION PORT-A-CATH;  Surgeon: Herbert Pun, MD;  Location: [facility] ORS;  Service: General;  Laterality: N/A;  . TONSILLECTOMY      Family History  Problem Relation Age of Onset  . Lung cancer Father   . Heart attack Father   . Lung cancer Paternal Uncle   . Heart disease Paternal Uncle   . Lung cancer Paternal Aunt     Social History:  reports that she has never smoked. She has never used smokeless tobacco. She reports that she does not drink alcohol or use drugs. Deniesany exposure to radiation or toxins.She has 2 children (43 year old daughter and 26 year old son).She use to work inthefast foodindustry. She is currently not working. Her husbandisMichael.[name] travels a lot for his job. The patient is alone today.  Allergies:  Allergies  Allergen Reactions  . Compazine [Prochlorperazine Edisylate] Other (See Comments)    Acute dystonic reaction  . Morphine And Related Shortness Of Breath  . Doxycycline Other (See Comments)    "made all my symptoms worse"   . Fluticasone Other (See Comments)  Doesn't work  . Levofloxacin Other (See Comments)    "made all my symptoms worse"     Current Medications: Current Outpatient Medications  Medication Sig Dispense Refill  . amphetamine-dextroamphetamine (ADDERALL XR) 25 MG 24 hr capsule Take 25 mg by mouth daily.    . Calcium Carbonate-Vitamin D (CALCIUM 500 + D) 500-125 MG-UNIT TABS Take 1 tablet by mouth daily.    . cetirizine (ZYRTEC) 10 MG tablet Take 10 mg by mouth daily.     . clonazePAM (KLONOPIN) 1 MG tablet Take 1 mg by mouth 3 (three) times daily as needed for anxiety.    . docusate sodium (COLACE)  100 MG capsule Take 100 mg by mouth 2 (two) times daily.    Marland Kitchen doxepin (SINEQUAN) 25 MG capsule Take 25 mg by mouth at bedtime.     Marland Kitchen ibuprofen (ADVIL) 200 MG tablet Take 400-800 mg by mouth every 6 (six) hours as needed for headache or moderate pain.    Marland Kitchen lamoTRIgine (LAMICTAL) 200 MG tablet Take 200 mg by mouth at bedtime.     . lidocaine-prilocaine (EMLA) cream Apply topically over port-a-cath 30 minutes to 1 hour prior to treatment. 30 g 2  . LORazepam (ATIVAN) 0.5 MG tablet Take 1 tablet (0.5 mg total) by mouth every 6 (six) hours as needed (Nausea or vomiting). 20 tablet 0  . lurasidone (LATUDA) 40 MG TABS tablet Take 40 mg by mouth at bedtime. Take with 60 mg to equal 100 mg at night    . Lurasidone HCl 60 MG TABS Take 60 mg by mouth at bedtime. Take with 40 mg to equal 100 mg at night    . metFORMIN (GLUCOPHAGE-XR) 500 MG 24 hr tablet Take 1,000 mg by mouth 2 (two) times daily.    . mometasone (NASONEX) 50 MCG/ACT nasal spray Place 2 sprays into the nose daily as needed (allergies).     . Multiple Vitamin (MULTIVITAMIN WITH MINERALS) TABS tablet Take 1 tablet by mouth daily.    Marland Kitchen omeprazole (PRILOSEC) 20 MG capsule Take 20 mg by mouth daily.     . ondansetron (ZOFRAN) 8 MG tablet Take 1 tablet (8 mg total) by mouth 2 (two) times daily as needed for refractory nausea / vomiting. Start on day 3 after carboplatin chemo. 30 tablet 1  . potassium chloride (KLOR-CON) 10 MEQ tablet Take 1 tablet (10 mEq total) by mouth daily. 30 tablet 0  . QUEtiapine (SEROQUEL XR) 300 MG 24 hr tablet Take 300 mg by mouth at bedtime.    . simvastatin (ZOCOR) 20 MG tablet Take 20 mg by mouth daily.    Marland Kitchen topiramate (TOPAMAX) 100 MG tablet Take 50-100 mg by mouth See admin instructions. Take 100 mg in the morning and 50 mg at night    . vitamin B-12 (CYANOCOBALAMIN) 1000 MCG tablet Take 1,000 mcg by mouth daily.     No current facility-administered medications for this visit.   Facility-Administered Medications  Ordered in Other Visits  Medication Dose Route Frequency Provider Last Rate Last Admin  . heparin lock flush 100 unit/mL  500 Units Intravenous Once Marielena Harvell C, MD      . sodium chloride flush (NS) 0.9 % injection 10 mL  10 mL Intravenous PRN Nolon Stalls C, MD   10 mL at 10/06/19 0900    Review of Systems  Constitutional: Positive for malaise/fatigue. Negative for chills, diaphoresis, fever and weight loss (stable).       Feels "ok".  HENT: Negative.  Negative for congestion, ear pain, hearing loss, nosebleeds, sinus pain and sore throat.   Eyes: Negative.  Negative for blurred vision, double vision and photophobia.  Respiratory: Negative.  Negative for cough, hemoptysis, sputum production, shortness of breath and wheezing.   Cardiovascular: Negative.  Negative for chest pain, palpitations and leg swelling.  Gastrointestinal: Negative for abdominal pain, blood in stool, constipation, diarrhea, heartburn (reflux; on antacids), melena, nausea (better) and vomiting.       Appetite slightly changed. Not drinking enough fluids.  Genitourinary: Negative for dysuria, flank pain, frequency, hematuria and urgency.       Premenopausal.   Musculoskeletal: Negative.  Negative for back pain, falls, joint pain, myalgias and neck pain.  Skin: Negative for itching and rash.       Right breast lump is smaller.  Neurological: Negative.  Negative for dizziness, tingling, sensory change, speech change, focal weakness, weakness and headaches.  Endo/Heme/Allergies: Does not bruise/bleed easily.       Type II diabetes.  Psychiatric/Behavioral: Negative.  Negative for depression and memory loss. The patient is not nervous/anxious and does not have insomnia.   All other systems reviewed and are negative.   Performance status (ECOG):  1  Vitals Temperature (!) 97.4 F (36.3 C), temperature source Tympanic, weight 134 lb 7.7 oz (61 kg), SpO2 100 %.   Physical Exam  Constitutional: She is  oriented to person, place, and time. She appears well-developed and well-nourished. No distress.  HENT:  Head: Normocephalic and atraumatic.  Mouth/Throat: Oropharynx is clear and moist. No oropharyngeal exudate.  Purple hat.  Mask.   Eyes: Pupils are equal, round, and reactive to light. Conjunctivae and EOM are normal. No scleral icterus.  Brown eyes.  Neck: No JVD present.  Cardiovascular: Normal rate, regular rhythm and normal heart sounds.  No murmur heard. Pulmonary/Chest: Effort normal and breath sounds normal. No respiratory distress. She has no wheezes. She has no rales. She exhibits no tenderness.  RIGHT breast mass 1.8 cm x 2.2 cm.  Abdominal: Soft. Bowel sounds are normal. She exhibits no distension and no mass. There is no abdominal tenderness. There is no rebound and no guarding.  Musculoskeletal:        General: No tenderness or edema. Normal range of motion.     Cervical back: Normal range of motion and neck supple.  Lymphadenopathy:       Head (right side): No preauricular, no posterior auricular and no occipital adenopathy present.       Head (left side): No preauricular, no posterior auricular and no occipital adenopathy present.    She has no cervical adenopathy.    She has no axillary adenopathy.       Right: No inguinal and no supraclavicular adenopathy present.       Left: No inguinal and no supraclavicular adenopathy present.  Neurological: She is alert and oriented to person, place, and time.  Skin: Skin is warm and dry. No rash noted. She is not diaphoretic. No erythema. No pallor.  Psychiatric: She has a normal mood and affect. Her behavior is normal. Judgment and thought content normal.  Nursing note and vitals reviewed.   Infusion on 10/06/2019  Component Date Value Ref Range Status  . WBC 10/06/2019 4.3  4.0 - 10.5 K/uL Final  . RBC 10/06/2019 3.78* 3.87 - 5.11 MIL/uL Final  . Hemoglobin 10/06/2019 10.5* 12.0 - 15.0 g/dL Final  . HCT 10/06/2019 32.8*  36.0 - 46.0 % Final  . MCV 10/06/2019 86.8  80.0 -  100.0 fL Final  . MCH 10/06/2019 27.8  26.0 - 34.0 pg Final  . MCHC 10/06/2019 32.0  30.0 - 36.0 g/dL Final  . RDW 10/06/2019 14.6  11.5 - 15.5 % Final  . Platelets 10/06/2019 214  150 - 400 K/uL Final  . nRBC 10/06/2019 0.0  0.0 - 0.2 % Final  . Neutrophils Relative % 10/06/2019 47  % Final  . Neutro Abs 10/06/2019 2.0  1.7 - 7.7 K/uL Final  . Lymphocytes Relative 10/06/2019 42  % Final  . Lymphs Abs 10/06/2019 1.8  0.7 - 4.0 K/uL Final  . Monocytes Relative 10/06/2019 5  % Final  . Monocytes Absolute 10/06/2019 0.2  0.1 - 1.0 K/uL Final  . Eosinophils Relative 10/06/2019 5  % Final  . Eosinophils Absolute 10/06/2019 0.2  0.0 - 0.5 K/uL Final  . Basophils Relative 10/06/2019 1  % Final  . Basophils Absolute 10/06/2019 0.0  0.0 - 0.1 K/uL Final  . Immature Granulocytes 10/06/2019 0  % Final  . Abs Immature Granulocytes 10/06/2019 0.01  0.00 - 0.07 K/uL Final   Performed at [facility], [address], [place], [place] [address]  . Preg Test, Ur 10/06/2019 NEGATIVE  NEGATIVE Final   Performed at [facility], [address] [place], [place] [address]    Assessment:  Heather Bell is a 44 y.o. female with clinical T1cN1triple negativerightbreast cancers/p ultrasound guided core biopsy. Pathology revealeda grade IIIinvasive mammary carcinoma, no special type of the right breast. Specimen was 10 mm.Ductal carcinoma in situ(DCIS)was not identified.Right axillarylymph nodewas positive for metastatic carcinoma.Tumor was ER - (<1%), PR - (<1%), and Her2/neu -.  Bilateral mammogram and right unilateral ultrasoundon 07/10/2019 revealed a 1.7 x 1.6 x 1.6 cmmass at the7 o'clock region of the right breast 7 cm from the nipple. There was an abnormal1.4 x 0.7 x 0.9 cmaxillary lymph node. Therewas a second3 mmaxillary lymph node with minimal focal cortical thickening.An ultrasound-guided core  biopsies of the mass and the enlarged right axillary lymph node was recommended. BI-RADS category 5.  PET scanon 08/11/2019 showed a hypermetabolic mass in the inferior RIGHT breast consistentwith herbreast carcinoma. There was noevidence hypermetabolic nodal metastasis.There was no distant metastatic disease.There was nopulmonary metastasis or skeletal metastasis.  Echoon 12/24/2020revealedan ejection fraction of50 to 55%. The left ventricle demonstrated global hypokinesis.MUGAon 08/24/2019 revealed an EF of 60%.She is followed by Dr End.  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  She is s/p week #5of Taxol + carboplatin(08/25/2019 - 09/29/2019).   Breast masswas larger after initiation of treatment felt secondary to post biopsy hematoma. Right breast ultrasoundon 09/07/2019 revealed the mass in the 7 o'clock position contained cystic components and measured 3 cm (compared to 1.7 cm on 07/10/2019). The biopsied lymph node with 3 mm cortical thickening in the axilla was similar.  She is premenopausal. She stopped taking birth control pills on 08/17/2019.  She has iron deficiency. Ferritin was 21 with an iron saturation of 7% and a TIBC 343 on 09/08/2019.  She received Venofer on 09/18/2019 and 09/29/2019.  She has B12 deficiency. B12 was 240 on 09/08/2019.  She has recurrent orthostatic hypotension.  Fluid intake is marginal.  AM cortisol was 5.3 (low) on 09/22/2019 suggestive of adrenal insufficiency.  She is undergoing evaluation by endocrinology (ACTH stim test).  Symptomatically, she feels "ok".  Fluid intake remains marginal.  She denies any neuropathy.  Breast mass is  1.8 x 2.2 cm.  Plan: 1.   Labs today:  CBC with diff, CMP, Mg, urine pregnancy test. 2.   Clinical stageIIB triple negativeright breast cancer PET scan- no evidence  of metastatic disease. Patient undergoingneoadjuvant chemotherapy. Weekly Taxol + carboplatin x 12 followed by AC x 4 with Neulasta support. She is s/p week #5Taxol and carboplatin. She is tolerating treatment well.   Nausea is well controlled.  She denies any neuropathy. Breast mass appears smaller. Mass was 1.7 cm by ultrasound on 07/10/2019. Mass s/p biopsy with hematoma was 3.5 cm by exam on 07/30/2019. Mass was 5.5 x 6.0 cm on 09/01/2019 and 5.0 x 5.5 cm on 09/08/2019. Mass was 4.5 x 4.5 cm on 09/15/2019 and 2.8 x 3.0 cm on 09/22/2019.                         Mass was 2.5 x 2.3 cm on 09/29/2019 and 1.8 x 2.2 cm today.                         Hematoma and mass are resolving. Discuss plan for weekly breast exams until ultrasound s/p 6 weeks of Taxol + carboplatin.    Anticipate follow-up exams every other week after ultrasound.    Schedule right breast ultrasound.    Ultrasound will also be performed after completion of Taxol + carboplatin/before initiation of AC. Labs reviewed.  Proceed with week #6 Taxol and carboplatin.  Discuss symptom management.  She has antiemetics at home to use on a prn bases.  Interventions are adequate.   Marland Kitchen  3.Orthostasis  Patient has low blood pressure weekly secondary to marginal fluid intake and low cortisol.  She received 1 liter NS last week.  IVF 500 cc NS over 1 hour with repeat orthostatics and possible additional fluid. 4.   Low cortisol  Patient is being scheduled for cortrosyn stim test.  She is currently not on steroids.  She appears to do better early after chemotherapy likely secondary to Decadron premedication. 5.   Iron deficiency anemia Hemoglobin has improved with iron supplementation.  She denies any  bleeding.  Diet is fair. She has received weekly Venofer x 2.  Venofer today (last planned dose).  Continue to monitor. 6. Hypokalemia, resolved Potassium 3.7.  Etiology likely secondary to potassium wasting due to carboplatin.  Continue daily oral potassium. 7.   Schedule right breast ultrasound on 10/12/2019. 8.   RTC on 10/09/2019 for IV fluids (NS 500 cc/hour x 1-2 hours). 9.   RTC on 10/13/2019 for MD assessment, labs (CBC with diff, CMP, Mg, urine pregnancy test), review of breast ultrasound and week #7 Taxol + carboplatin.  I discussed the assessment and treatment plan with the patient.  The patient was provided an opportunity to ask questions and all were answered.  The patient agreed with the plan and demonstrated an understanding of the instructions.  The patient was advised to call back if the symptoms worsen or if the condition fails to improve as anticipated.   Lequita Asal, MD, PhD    10/06/2019, 9:28 AM  I, Selena Batten, am acting as scribe for [employer]. Mike Gip, MD, PhD.  I, Thijs Brunton C. Mike Gip, MD, have reviewed the above documentation for accuracy and completeness, and I agree with the above.

## 2019-10-05 ENCOUNTER — Other Ambulatory Visit: Payer: Self-pay

## 2019-10-05 NOTE — Progress Notes (Signed)
Confirmed Name and DOB. Denies any concerns.  

## 2019-10-06 ENCOUNTER — Inpatient Hospital Stay (HOSPITAL_BASED_OUTPATIENT_CLINIC_OR_DEPARTMENT_OTHER): Payer: BC Managed Care – PPO | Admitting: Hematology and Oncology

## 2019-10-06 ENCOUNTER — Inpatient Hospital Stay: Payer: BC Managed Care – PPO

## 2019-10-06 ENCOUNTER — Encounter: Payer: Self-pay | Admitting: Hematology and Oncology

## 2019-10-06 VITALS — BP 93/64 | HR 103 | Temp 97.4°F | Wt 134.5 lb

## 2019-10-06 DIAGNOSIS — E2740 Unspecified adrenocortical insufficiency: Secondary | ICD-10-CM

## 2019-10-06 DIAGNOSIS — C50919 Malignant neoplasm of unspecified site of unspecified female breast: Secondary | ICD-10-CM | POA: Diagnosis not present

## 2019-10-06 DIAGNOSIS — Z5111 Encounter for antineoplastic chemotherapy: Secondary | ICD-10-CM

## 2019-10-06 DIAGNOSIS — E876 Hypokalemia: Secondary | ICD-10-CM

## 2019-10-06 DIAGNOSIS — D509 Iron deficiency anemia, unspecified: Secondary | ICD-10-CM | POA: Diagnosis not present

## 2019-10-06 DIAGNOSIS — E538 Deficiency of other specified B group vitamins: Secondary | ICD-10-CM | POA: Diagnosis not present

## 2019-10-06 DIAGNOSIS — R7989 Other specified abnormal findings of blood chemistry: Secondary | ICD-10-CM

## 2019-10-06 DIAGNOSIS — D649 Anemia, unspecified: Secondary | ICD-10-CM

## 2019-10-06 DIAGNOSIS — C50911 Malignant neoplasm of unspecified site of right female breast: Secondary | ICD-10-CM | POA: Diagnosis not present

## 2019-10-06 LAB — CBC WITH DIFFERENTIAL/PLATELET
Abs Immature Granulocytes: 0.01 10*3/uL (ref 0.00–0.07)
Basophils Absolute: 0 10*3/uL (ref 0.0–0.1)
Basophils Relative: 1 %
Eosinophils Absolute: 0.2 10*3/uL (ref 0.0–0.5)
Eosinophils Relative: 5 %
HCT: 32.8 % — ABNORMAL LOW (ref 36.0–46.0)
Hemoglobin: 10.5 g/dL — ABNORMAL LOW (ref 12.0–15.0)
Immature Granulocytes: 0 %
Lymphocytes Relative: 42 %
Lymphs Abs: 1.8 10*3/uL (ref 0.7–4.0)
MCH: 27.8 pg (ref 26.0–34.0)
MCHC: 32 g/dL (ref 30.0–36.0)
MCV: 86.8 fL (ref 80.0–100.0)
Monocytes Absolute: 0.2 10*3/uL (ref 0.1–1.0)
Monocytes Relative: 5 %
Neutro Abs: 2 10*3/uL (ref 1.7–7.7)
Neutrophils Relative %: 47 %
Platelets: 214 10*3/uL (ref 150–400)
RBC: 3.78 MIL/uL — ABNORMAL LOW (ref 3.87–5.11)
RDW: 14.6 % (ref 11.5–15.5)
WBC: 4.3 10*3/uL (ref 4.0–10.5)
nRBC: 0 % (ref 0.0–0.2)

## 2019-10-06 LAB — COMPREHENSIVE METABOLIC PANEL
ALT: 21 U/L (ref 0–44)
AST: 18 U/L (ref 15–41)
Albumin: 4.3 g/dL (ref 3.5–5.0)
Alkaline Phosphatase: 41 U/L (ref 38–126)
Anion gap: 5 (ref 5–15)
BUN: 15 mg/dL (ref 6–20)
CO2: 23 mmol/L (ref 22–32)
Calcium: 9.1 mg/dL (ref 8.9–10.3)
Chloride: 108 mmol/L (ref 98–111)
Creatinine, Ser: 0.71 mg/dL (ref 0.44–1.00)
GFR calc Af Amer: >60 mL/min (ref >60)
GFR calc non Af Amer: >60 mL/min (ref >60)
Glucose, Bld: 99 mg/dL (ref 70–99)
Potassium: 3.7 mmol/L (ref 3.5–5.1)
Sodium: 136 mmol/L (ref 135–145)
Total Bilirubin: 0.6 mg/dL (ref 0.3–1.2)
Total Protein: 7.1 g/dL (ref 6.5–8.1)

## 2019-10-06 LAB — MAGNESIUM: Magnesium: 1.9 mg/dL (ref 1.7–2.4)

## 2019-10-06 LAB — PREGNANCY, URINE: Preg Test, Ur: NEGATIVE

## 2019-10-06 MED ORDER — FAMOTIDINE IN NACL 20-0.9 MG/50ML-% IV SOLN
20.0000 mg | Freq: Once | INTRAVENOUS | Status: AC
  Administered 2019-10-06: 12:00:00 20 mg via INTRAVENOUS

## 2019-10-06 MED ORDER — IRON SUCROSE 20 MG/ML IV SOLN
200.0000 mg | Freq: Once | INTRAVENOUS | Status: AC
  Administered 2019-10-06: 200 mg via INTRAVENOUS

## 2019-10-06 MED ORDER — SODIUM CHLORIDE 0.9 % IV SOLN
Freq: Once | INTRAVENOUS | Status: AC
  Filled 2019-10-06: qty 250

## 2019-10-06 MED ORDER — SODIUM CHLORIDE 0.9% FLUSH
10.0000 mL | INTRAVENOUS | Status: DC | PRN
  Administered 2019-10-06: 09:00:00 10 mL via INTRAVENOUS
  Filled 2019-10-06: qty 10

## 2019-10-06 MED ORDER — SODIUM CHLORIDE 0.9 % IV SOLN
80.0000 mg/m2 | Freq: Once | INTRAVENOUS | Status: AC
  Administered 2019-10-06: 132 mg via INTRAVENOUS
  Filled 2019-10-06: qty 22

## 2019-10-06 MED ORDER — DIPHENHYDRAMINE HCL 50 MG/ML IJ SOLN
50.0000 mg | Freq: Once | INTRAMUSCULAR | Status: AC
  Administered 2019-10-06: 12:00:00 50 mg via INTRAVENOUS

## 2019-10-06 MED ORDER — HEPARIN SOD (PORK) LOCK FLUSH 100 UNIT/ML IV SOLN
500.0000 [IU] | Freq: Once | INTRAVENOUS | Status: AC
  Administered 2019-10-06: 15:00:00 500 [IU] via INTRAVENOUS
  Filled 2019-10-06: qty 5

## 2019-10-06 MED ORDER — PALONOSETRON HCL INJECTION 0.25 MG/5ML
0.2500 mg | Freq: Once | INTRAVENOUS | Status: AC
  Administered 2019-10-06: 12:00:00 0.25 mg via INTRAVENOUS

## 2019-10-06 MED ORDER — SODIUM CHLORIDE 0.9 % IV SOLN: 200.0000 mg | Freq: Once | INTRAVENOUS | Status: DC

## 2019-10-06 MED ORDER — SODIUM CHLORIDE 0.9 % IV SOLN
Freq: Once | INTRAVENOUS | Status: DC
  Filled 2019-10-06: qty 250

## 2019-10-06 MED ORDER — SODIUM CHLORIDE 0.9 % IV SOLN
221.6000 mg | Freq: Once | INTRAVENOUS | Status: AC
  Administered 2019-10-06: 220 mg via INTRAVENOUS
  Filled 2019-10-06: qty 22

## 2019-10-06 MED ORDER — SODIUM CHLORIDE 0.9 % IV SOLN
10.0000 mg | Freq: Once | INTRAVENOUS | Status: AC
  Administered 2019-10-06: 10 mg via INTRAVENOUS
  Filled 2019-10-06: qty 1

## 2019-10-09 ENCOUNTER — Inpatient Hospital Stay: Payer: BC Managed Care – PPO

## 2019-10-09 ENCOUNTER — Other Ambulatory Visit: Payer: Self-pay

## 2019-10-09 ENCOUNTER — Ambulatory Visit
Admission: RE | Admit: 2019-10-09 | Discharge: 2019-10-09 | Disposition: A | Discharge status: Home / Self Care | Payer: BC Managed Care – PPO | Source: Ambulatory Visit | Attending: Hematology and Oncology | Admitting: Hematology and Oncology

## 2019-10-09 VITALS — BP 104/68 | HR 96 | Temp 98.4°F | Resp 16

## 2019-10-09 DIAGNOSIS — C50911 Malignant neoplasm of unspecified site of right female breast: Secondary | ICD-10-CM | POA: Diagnosis not present

## 2019-10-09 DIAGNOSIS — E860 Dehydration: Secondary | ICD-10-CM

## 2019-10-09 DIAGNOSIS — C50919 Malignant neoplasm of unspecified site of unspecified female breast: Secondary | ICD-10-CM | POA: Diagnosis not present

## 2019-10-09 MED ORDER — SODIUM CHLORIDE 0.9% FLUSH
10.0000 mL | INTRAVENOUS | Status: DC | PRN
  Administered 2019-10-09: 10 mL via INTRAVENOUS
  Filled 2019-10-09: qty 10

## 2019-10-09 MED ORDER — HEPARIN SOD (PORK) LOCK FLUSH 100 UNIT/ML IV SOLN
500.0000 [IU] | Freq: Once | INTRAVENOUS | Status: AC
  Administered 2019-10-09: 500 [IU] via INTRAVENOUS
  Filled 2019-10-09: qty 5

## 2019-10-09 MED ORDER — SODIUM CHLORIDE 0.9 % IV SOLN
Freq: Once | INTRAVENOUS | Status: AC
  Filled 2019-10-09: qty 250

## 2019-10-11 NOTE — Progress Notes (Signed)
Patient is unable to be seen due to pending COVD test.

## 2019-10-12 ENCOUNTER — Ambulatory Visit: Payer: BC Managed Care – PPO | Attending: Internal Medicine

## 2019-10-12 ENCOUNTER — Encounter: Payer: Self-pay | Admitting: Hematology and Oncology

## 2019-10-12 ENCOUNTER — Other Ambulatory Visit: Payer: Self-pay

## 2019-10-12 ENCOUNTER — Ambulatory Visit: Payer: Self-pay

## 2019-10-12 DIAGNOSIS — I951 Orthostatic hypotension: Secondary | ICD-10-CM | POA: Insufficient documentation

## 2019-10-12 DIAGNOSIS — Z20822 Contact with and (suspected) exposure to covid-19: Secondary | ICD-10-CM

## 2019-10-12 NOTE — Progress Notes (Signed)
No new changes noted today. The patient Name and DOB has been verified by phone today. 

## 2019-10-13 ENCOUNTER — Ambulatory Visit: Payer: BC Managed Care – PPO

## 2019-10-13 ENCOUNTER — Inpatient Hospital Stay: Payer: BC Managed Care – PPO

## 2019-10-13 ENCOUNTER — Inpatient Hospital Stay: Payer: BC Managed Care – PPO | Admitting: Hematology and Oncology

## 2019-10-13 ENCOUNTER — Other Ambulatory Visit: Payer: Self-pay | Admitting: Hematology and Oncology

## 2019-10-13 VITALS — Wt 135.6 lb

## 2019-10-13 DIAGNOSIS — Z5111 Encounter for antineoplastic chemotherapy: Secondary | ICD-10-CM

## 2019-10-13 DIAGNOSIS — C50911 Malignant neoplasm of unspecified site of right female breast: Secondary | ICD-10-CM | POA: Diagnosis not present

## 2019-10-13 DIAGNOSIS — C50919 Malignant neoplasm of unspecified site of unspecified female breast: Secondary | ICD-10-CM

## 2019-10-13 LAB — CBC WITH DIFFERENTIAL/PLATELET
Abs Immature Granulocytes: 0.01 10*3/uL (ref 0.00–0.07)
Basophils Absolute: 0 10*3/uL (ref 0.0–0.1)
Basophils Relative: 1 %
Eosinophils Absolute: 0.1 10*3/uL (ref 0.0–0.5)
Eosinophils Relative: 2 %
HCT: 31.5 % — ABNORMAL LOW (ref 36.0–46.0)
Hemoglobin: 10.4 g/dL — ABNORMAL LOW (ref 12.0–15.0)
Immature Granulocytes: 0 %
Lymphocytes Relative: 48 %
Lymphs Abs: 1.6 10*3/uL (ref 0.7–4.0)
MCH: 28.8 pg (ref 26.0–34.0)
MCHC: 33 g/dL (ref 30.0–36.0)
MCV: 87.3 fL (ref 80.0–100.0)
Monocytes Absolute: 0.1 10*3/uL (ref 0.1–1.0)
Monocytes Relative: 4 %
Neutro Abs: 1.5 10*3/uL — ABNORMAL LOW (ref 1.7–7.7)
Neutrophils Relative %: 45 %
Platelets: 263 10*3/uL (ref 150–400)
RBC: 3.61 MIL/uL — ABNORMAL LOW (ref 3.87–5.11)
RDW: 15.2 % (ref 11.5–15.5)
WBC: 3.3 10*3/uL — ABNORMAL LOW (ref 4.0–10.5)
nRBC: 0 % (ref 0.0–0.2)

## 2019-10-13 LAB — MAGNESIUM: Magnesium: 1.8 mg/dL (ref 1.7–2.4)

## 2019-10-13 LAB — COMPREHENSIVE METABOLIC PANEL
ALT: 25 U/L (ref 0–44)
AST: 24 U/L (ref 15–41)
Albumin: 4.1 g/dL (ref 3.5–5.0)
Alkaline Phosphatase: 44 U/L (ref 38–126)
Anion gap: 8 (ref 5–15)
BUN: 13 mg/dL (ref 6–20)
CO2: 21 mmol/L — ABNORMAL LOW (ref 22–32)
Calcium: 9.1 mg/dL (ref 8.9–10.3)
Chloride: 106 mmol/L (ref 98–111)
Creatinine, Ser: 0.64 mg/dL (ref 0.44–1.00)
GFR calc Af Amer: >60 mL/min (ref >60)
GFR calc non Af Amer: >60 mL/min (ref >60)
Glucose, Bld: 122 mg/dL — ABNORMAL HIGH (ref 70–99)
Potassium: 3.5 mmol/L (ref 3.5–5.1)
Sodium: 135 mmol/L (ref 135–145)
Total Bilirubin: 0.8 mg/dL (ref 0.3–1.2)
Total Protein: 7 g/dL (ref 6.5–8.1)

## 2019-10-13 LAB — PREGNANCY, URINE: Preg Test, Ur: NEGATIVE

## 2019-10-13 LAB — NOVEL CORONAVIRUS, NAA: SARS-CoV-2, NAA: NOT DETECTED

## 2019-10-13 MED ORDER — SODIUM CHLORIDE 0.9% FLUSH
10.0000 mL | INTRAVENOUS | Status: DC | PRN
  Administered 2019-10-13: 09:00:00 10 mL via INTRAVENOUS
  Filled 2019-10-13: qty 10

## 2019-10-13 MED ORDER — HEPARIN SOD (PORK) LOCK FLUSH 100 UNIT/ML IV SOLN
500.0000 [IU] | Freq: Once | INTRAVENOUS | Status: AC
  Administered 2019-10-13: 500 [IU] via INTRAVENOUS
  Filled 2019-10-13: qty 5

## 2019-10-13 NOTE — Progress Notes (Signed)
Patient reports feeling good today.  She says she had one day of nausea this past week.  Her husband was home two weeks ago, is currently in [place], tested positive for covid the 21st of February.  He asked her to get tested and she was tested yesterday at [facility].  The patient believes she is negative since her husband hasn't been home for two weeks.  Sitting and standing blood pressures entered into chart.  Patient believes that she would like the extra hydration today.  MD made aware that patient was tested for Covid yesterday.  After consulting with our manager, patient must be rescheduled until results are in.  Patient made aware that her treatment would be rescheduled and that the extra week would also give her immune system a chance to elevate her WBC.  Patient voiced understanding.

## 2019-10-13 NOTE — Patient Instructions (Signed)
Awaiting covid testing results.

## 2019-10-13 NOTE — Progress Notes (Signed)
Reports having 1 day of nausea post last treatment.

## 2019-10-16 ENCOUNTER — Other Ambulatory Visit: Payer: Self-pay

## 2019-10-16 ENCOUNTER — Inpatient Hospital Stay: Payer: BC Managed Care – PPO

## 2019-10-16 VITALS — BP 102/68 | HR 84 | Temp 99.0°F | Resp 16

## 2019-10-16 DIAGNOSIS — C50911 Malignant neoplasm of unspecified site of right female breast: Secondary | ICD-10-CM | POA: Diagnosis not present

## 2019-10-16 DIAGNOSIS — E860 Dehydration: Secondary | ICD-10-CM

## 2019-10-16 DIAGNOSIS — D509 Iron deficiency anemia, unspecified: Secondary | ICD-10-CM

## 2019-10-16 MED ORDER — SODIUM CHLORIDE 0.9 % IV SOLN
Freq: Once | INTRAVENOUS | Status: AC
  Filled 2019-10-16: qty 250

## 2019-10-16 MED ORDER — SODIUM CHLORIDE 0.9% FLUSH
10.0000 mL | INTRAVENOUS | Status: DC | PRN
  Administered 2019-10-16: 10 mL via INTRAVENOUS
  Filled 2019-10-16: qty 10

## 2019-10-16 MED ORDER — HEPARIN SOD (PORK) LOCK FLUSH 100 UNIT/ML IV SOLN
500.0000 [IU] | Freq: Once | INTRAVENOUS | Status: AC
  Administered 2019-10-16: 500 [IU] via INTRAVENOUS
  Filled 2019-10-16: qty 5

## 2019-10-18 NOTE — Progress Notes (Signed)
[facility]  [address] [place], [place] [address] Phone: [phone]  Fax: [phone]   Clinic Day:  10/20/2019  Referring physician: Center, Lynd  Chief Complaint: Heather Bell is a 44 y.o. female with clinical stage IIBtriple negativerightbreast cancer who is seen for assessment prior to week #7 of Taxol + carboplatin.   HPI: The patient was last seen in the medical oncology clinic on 10/06/2019. At that time, she felt "ok".  Fluid intake remained marginal.  She denied any neuropathy.  Breast mass was 1.8 x 2.2 cm.  Hematocrit was 32.8, hemoglobin 10.5, platelets 214,000, WBC 4,300. CMP was normal. Magnesium 1.9.  She received IVF (500 cc pre and post treatment).  She received Venofer.  She received week #6 Taxol + carboplatin.   She received 500 cc NS in the infusion center on 10/09/2019.  Right breast ultrasound on 10/09/2019 revealed interval decrease in size of RIGHT breast mass (1.4 x 1.7 x 1.4 cm). Mass was less well defined and associated with parenchymal edema.   She was not seen in the oncology clinic on 10/13/2019 as she had just been tested for COVID-19.  Testing returned later that day and was negative.  Labs included a hematocrit 31.5, hemoglobin 10.4, platelets 263,000, WBC 3,300 (ANC 1,500).  CMP was normal. Pregnancy urine test was negative.   During the interim, she has felt "fine". She continues to have a lot of trouble with her left shoulder. She was never contacted by the surgeon. She is still not drinking enough fluids but is steadily trying to drink more.  Her nausea and reflux are better. She has noticed no changes in her breast mass since last visit. Her blood sugar is good. She denies any neuropathy in her fingers and toes.   She will be seen in endocrinology on 10/22/2019 for a cortrosyn stimulation test.    Past Medical History:  Diagnosis Date  . Allergic rhinitis   . Anxiety   . Bipolar disorder ([place])     depression  . Cancer [facility])    breast right  . Depression   . Diabetes ([place])   . GERD (gastroesophageal reflux disease)   . Hyperlipidemia     Past Surgical History:  Procedure Laterality Date  . BREAST BIOPSY Right 07/22/2019   mass bx, path pending, heart marker  . BREAST BIOPSY Right 07/22/2019   LN bx, path pending,  butterfly hydromarker  . IMAGE GUIDED SINUS SURGERY    . KNEE SURGERY    . NASAL SEPTUM SURGERY    . PORTACATH PLACEMENT N/A 08/07/2019   Procedure: INSERTION PORT-A-CATH;  Surgeon: Herbert Pun, MD;  Location: [facility] ORS;  Service: General;  Laterality: N/A;  . TONSILLECTOMY      Family History  Problem Relation Age of Onset  . Lung cancer Father   . Heart attack Father   . Lung cancer Paternal Uncle   . Heart disease Paternal Uncle   . Lung cancer Paternal Aunt     Social History:  reports that she has never smoked. She has never used smokeless tobacco. She reports that she does not drink alcohol or use drugs. Deniesany exposure to radiation or toxins.She has 2 children (57 year old daughter and 64 year old son).She use to work inthefast foodindustry. She is currently not working. Her husbandisMichael.[name] travels a lot for his job. The patient is alone today.  Allergies:  Allergies  Allergen Reactions  . Compazine [Prochlorperazine Edisylate] Other (See  Comments)    Acute dystonic reaction  . Morphine And Related Shortness Of Breath  . Doxycycline Other (See Comments)    "made all my symptoms worse"   . Fluticasone Other (See Comments)    Doesn't work  . Levofloxacin Other (See Comments)    "made all my symptoms worse"     Current Medications: Current Outpatient Medications  Medication Sig Dispense Refill  . amphetamine-dextroamphetamine (ADDERALL XR) 25 MG 24 hr capsule Take 25 mg by mouth daily.    . Calcium Carbonate-Vitamin D (CALCIUM 500 + D) 500-125 MG-UNIT TABS Take 1 tablet by mouth daily.    . cetirizine (ZYRTEC)  10 MG tablet Take 10 mg by mouth daily.     . clonazePAM (KLONOPIN) 1 MG tablet Take 1 mg by mouth 3 (three) times daily as needed for anxiety.    . docusate sodium (COLACE) 100 MG capsule Take 100 mg by mouth 2 (two) times daily.    Marland Kitchen doxepin (SINEQUAN) 25 MG capsule Take 25 mg by mouth at bedtime.     . lamoTRIgine (LAMICTAL) 200 MG tablet Take 200 mg by mouth at bedtime.     . lidocaine-prilocaine (EMLA) cream Apply topically over port-a-cath 30 minutes to 1 hour prior to treatment. 30 g 2  . LORazepam (ATIVAN) 0.5 MG tablet Take 1 tablet (0.5 mg total) by mouth every 6 (six) hours as needed (Nausea or vomiting). 20 tablet 0  . lurasidone (LATUDA) 40 MG TABS tablet Take 40 mg by mouth at bedtime. Take with 60 mg to equal 100 mg at night    . Lurasidone HCl 60 MG TABS Take 60 mg by mouth at bedtime. Take with 40 mg to equal 100 mg at night    . metFORMIN (GLUCOPHAGE-XR) 500 MG 24 hr tablet Take 1,000 mg by mouth 2 (two) times daily.    . mometasone (NASONEX) 50 MCG/ACT nasal spray Place 2 sprays into the nose daily as needed (allergies).     . Multiple Vitamin (MULTIVITAMIN WITH MINERALS) TABS tablet Take 1 tablet by mouth daily.    Marland Kitchen omeprazole (PRILOSEC) 20 MG capsule Take 20 mg by mouth daily.     . potassium chloride (KLOR-CON) 10 MEQ tablet Take 1 tablet (10 mEq total) by mouth daily. 30 tablet 0  . QUEtiapine (SEROQUEL XR) 300 MG 24 hr tablet Take 300 mg by mouth at bedtime.    . simvastatin (ZOCOR) 20 MG tablet Take 20 mg by mouth daily.    Marland Kitchen topiramate (TOPAMAX) 100 MG tablet Take 50-100 mg by mouth See admin instructions. Take 100 mg in the morning and 50 mg at night    . vitamin B-12 (CYANOCOBALAMIN) 1000 MCG tablet Take 1,000 mcg by mouth daily.    Marland Kitchen ibuprofen (ADVIL) 200 MG tablet Take 400-800 mg by mouth every 6 (six) hours as needed for headache or moderate pain.    Marland Kitchen ondansetron (ZOFRAN) 8 MG tablet Take 1 tablet (8 mg total) by mouth 2 (two) times daily as needed for refractory  nausea / vomiting. Start on day 3 after carboplatin chemo. (Patient not taking: Reported on 10/12/2019) 30 tablet 1   No current facility-administered medications for this visit.    Review of Systems  Constitutional: Negative for chills, diaphoresis, fever, malaise/fatigue and weight loss (stable).       Feels "ok".  HENT: Negative for congestion, ear discharge, ear pain, hearing loss, nosebleeds, sinus pain, sore throat and tinnitus.   Eyes: Negative for blurred vision.  Respiratory: Negative for cough, hemoptysis, sputum production and shortness of breath.   Cardiovascular: Negative for chest pain, palpitations and leg swelling.  Gastrointestinal: Negative for abdominal pain, blood in stool, constipation, diarrhea, heartburn (reflux; on antacids; better), melena, nausea and vomiting.       Not drinking enough fluids.  Genitourinary: Negative for dysuria, frequency, hematuria and urgency.       Premenopausal.  Musculoskeletal: Positive for joint pain (left shoulder). Negative for back pain, myalgias and neck pain.  Skin: Negative for itching and rash.       Right breast lump, unchanged.  Neurological: Negative for dizziness, tingling, sensory change, weakness and headaches.  Endo/Heme/Allergies: Does not bruise/bleed easily.       Type II diabetes.  Cortrosyn stim test this week.  Psychiatric/Behavioral: Negative for depression and memory loss. The patient is not nervous/anxious and does not have insomnia.   All other systems reviewed and are negative.   Performance status (ECOG): 1  Vitals Blood pressure 94/66, pulse 95, resp. rate 18, height '5\' 6"'  (1.676 m), weight 135 lb 7.6 oz (61.4 kg), SpO2 100 %.   Physical Exam  Constitutional: She is oriented to person, place, and time. She appears well-developed and well-nourished. No distress.  HENT:  Head: Normocephalic and atraumatic.  Mouth/Throat: Oropharynx is clear and moist. No oropharyngeal exudate.  Pink wrap.  Mask.  Eyes:  Pupils are equal, round, and reactive to light. Conjunctivae and EOM are normal. No scleral icterus.  Brown eyes.  Cardiovascular: Normal rate, regular rhythm and normal heart sounds.  No murmur heard. Pulmonary/Chest: Effort normal and breath sounds normal. No respiratory distress. She has no wheezes. She has no rales. She exhibits no tenderness.  RIGHT breast mass less distinct and difficult to measure (approximately 1.6 cm x 1.4 cm).  Abdominal: Soft. Bowel sounds are normal. She exhibits no distension and no mass. There is no abdominal tenderness. There is no rebound and no guarding.  Musculoskeletal:        General: No tenderness or edema. Normal range of motion.     Cervical back: Normal range of motion and neck supple.  Lymphadenopathy:       Head (right side): No preauricular, no posterior auricular and no occipital adenopathy present.       Head (left side): No preauricular, no posterior auricular and no occipital adenopathy present.    She has no cervical adenopathy.    She has no axillary adenopathy.       Right: No inguinal and no supraclavicular adenopathy present.       Left: No inguinal and no supraclavicular adenopathy present.  Neurological: She is alert and oriented to person, place, and time.  Skin: Skin is warm and dry. No rash noted. She is not diaphoretic. No erythema. No pallor.  Psychiatric: She has a normal mood and affect. Her behavior is normal. Judgment and thought content normal.  Nursing note and vitals reviewed.   Appointment on 10/20/2019  Component Date Value Ref Range Status  . Magnesium 10/20/2019 2.1  1.7 - 2.4 mg/dL Final   Performed at [facility], [address] [place],  [address]  . Sodium 10/20/2019 137  135 - 145 mmol/L Final  . Potassium 10/20/2019 3.8  3.5 - 5.1 mmol/L Final  . Chloride 10/20/2019 106  98 - 111 mmol/L Final  . CO2 10/20/2019 22  22 - 32 mmol/L Final  . Glucose, Bld 10/20/2019 90  70 - 99 mg/dL Final  Glucose reference range applies only to samples taken after fasting for at least 8 hours.  . BUN 10/20/2019 10  6 - 20 mg/dL Final  . Creatinine, Ser 10/20/2019 0.63  0.44 - 1.00 mg/dL Final  . Calcium 10/20/2019 9.3  8.9 - 10.3 mg/dL Final  . Total Protein 10/20/2019 7.2  6.5 - 8.1 g/dL Final  . Albumin 10/20/2019 4.2  3.5 - 5.0 g/dL Final  . AST 10/20/2019 17  15 - 41 U/L Final  . ALT 10/20/2019 14  0 - 44 U/L Final  . Alkaline Phosphatase 10/20/2019 48  38 - 126 U/L Final  . Total Bilirubin 10/20/2019 0.6  0.3 - 1.2 mg/dL Final  . GFR calc non Af Amer 10/20/2019 >60  >60 mL/min Final  . GFR calc Af Amer 10/20/2019 >60  >60 mL/min Final  . Anion gap 10/20/2019 9  5 - 15 Final   Performed at [facility], [address], [place], [place] [address]  . WBC 10/20/2019 3.6* 4.0 - 10.5 K/uL Final  . RBC 10/20/2019 3.91  3.87 - 5.11 MIL/uL Final  . Hemoglobin 10/20/2019 11.3* 12.0 - 15.0 g/dL Final  . HCT 10/20/2019 34.5* 36.0 - 46.0 % Final  . MCV 10/20/2019 88.2  80.0 - 100.0 fL Final  . MCH 10/20/2019 28.9  26.0 - 34.0 pg Final  . MCHC 10/20/2019 32.8  30.0 - 36.0 g/dL Final  . RDW 10/20/2019 16.6* 11.5 - 15.5 % Final  . Platelets 10/20/2019 258  150 - 400 K/uL Final  . nRBC 10/20/2019 0.0  0.0 - 0.2 % Final  . Neutrophils Relative % 10/20/2019 36  % Final  . Neutro Abs 10/20/2019 1.3* 1.7 - 7.7 K/uL Final  . Lymphocytes Relative 10/20/2019 54  % Final  . Lymphs Abs 10/20/2019 1.9  0.7 - 4.0 K/uL Final  . Monocytes Relative 10/20/2019 8  % Final  . Monocytes Absolute 10/20/2019 0.3  0.1 - 1.0 K/uL Final  . Eosinophils Relative 10/20/2019 1  % Final  . Eosinophils Absolute 10/20/2019 0.1  0.0 - 0.5 K/uL Final  . Basophils Relative 10/20/2019 1  % Final  . Basophils Absolute 10/20/2019 0.0  0.0 - 0.1 K/uL Final  . Immature Granulocytes 10/20/2019 0  % Final  . Abs Immature Granulocytes 10/20/2019 0.01  0.00 - 0.07 K/uL Final   Performed at [facility],  [address], [place], [place] [address]  . Preg Test, Ur 10/20/2019 NEGATIVE  NEGATIVE Final   Performed at [facility], [address] [place], [place] [address]    Assessment:  Heather Bell is a 44 y.o. female with clinical T1cN1triple negativerightbreast cancers/p ultrasound guided core biopsy. Pathology revealeda grade IIIinvasive mammary carcinoma, no special type of the right breast. Specimen was 10 mm.Ductal carcinoma in situ(DCIS)was not identified.Right axillarylymph nodewas positive for metastatic carcinoma.Tumor was ER - (<1%), PR - (<1%), and Her2/neu -.  Bilateral mammogram and right unilateral ultrasoundon 07/10/2019 revealed a 1.7 x 1.6 x 1.6 cmmass at the7 o'clock region of the right breast 7 cm from the nipple. There was an abnormal1.4 x 0.7 x 0.9 cmaxillary lymph node. Therewas a second3 mmaxillary lymph node with minimal focal cortical thickening.An ultrasound-guided core biopsies of the mass and the enlarged right axillary lymph node was recommended. BI-RADS category 5.  PET scanon 08/11/2019 showed a hypermetabolic mass in the inferior RIGHT breast consistentwith herbreast carcinoma. There was noevidence hypermetabolic nodal metastasis.There was no distant metastatic disease.There was  nopulmonary metastasis or skeletal metastasis.  Echoon 12/24/2020revealedan ejection fraction of50 to 55%. The left ventricle demonstrated global hypokinesis.MUGAon 08/24/2019 revealed an EF of 60%.She is followed by Dr End.  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  She is s/p week #6of Taxol + carboplatin(08/25/2019 - 10/06/2019).  Breast masswas larger after initiation of treatment felt secondary to post biopsy hematoma. Right breast ultrasoundon 09/07/2019 revealed the mass in the 7 o'clock position contained cystic  components and measured 3 cm (compared to 1.7 cm on 07/10/2019). The biopsied lymph node with 3 mm cortical thickening in the axilla was similar.  Right breast ultrasound on 10/09/2019 revealed interval decrease in size of RIGHT breast mass (1.4 x 1.7 x 1.4 cm).  Mass was less well defined and associated with parenchymal edema.   She is premenopausal. She stopped taking birth control pills on 08/17/2019.  She has iron deficiency. Ferritin was 21 with an iron saturation of 7% and a TIBC 343 on 09/08/2019.  She received Venofer weekly x 3 (09/18/2019 - 10/06/2019).  She has B12 deficiency. B12 was 240 on 09/08/2019.  She has recurrent orthostatic hypotension. Fluid intake is marginal. AM cortisol was 5.3 (low) on 09/22/2019 suggestive of adrenal insufficiency.  She is undergoing evaluation by endocrinology (ACTH stim test).  Symptomatically, she is doing well,  She denies any neuropathy.  Exam reveals a 1.6 x 1.4 cm right breast mass.  ANC is 1300.  Plan: 1.   Labs today:  CBC with diff, CMP, Mg, urine pregnancy test.  2.   Clinical stageIIB triple negativeright breast cancer PET scan- no evidence of metastatic disease. Patient undergoingneoadjuvant chemotherapy. Weekly Taxol + carboplatin x 12 followed by AC x 4 with Neulasta support. She is s/p week #6Taxol and carboplatin. She is tolerating treatment well.   She has no neuropathy.   Counts are slightly low today (ANC 1300).    She has not had treatment in 2 weeks (missed 10/13/2019 treatment for COVID-19 exposure).    Discuss continuation of therapy with Taxol alone this week.    Discuss use of Granix 300 mcg SQ to support counts (1-2x/week as needed).     Preauth Granix.    Check counts on 10/23/2019. Breast mass is smaller. Mass is 1.6 x 1.4 cm today.       Ultrasound on 10/09/2019  after 6 weeks Taxol + carboplatin revealed 1.4 x 1.7 x 1.4 cm mass (less well defined). Labs reviewed.  Proceed with cycle #7 Taxol this week.  Anticipate next breast ultrasound after completion of Taxol + carboplatin/before initiation of AC.  Discuss assessment next week with NP and consideration of week #8 Taxol +/- carboplatin. 3. Low cortisol  AM cortisol was low on 09/22/2019.  Cortosyn stimulation test on 10/22/2019.  She is currently not on steroids. 4. Iron deficiency anemia Hematocrit 34.5, hemoglobin 11.3, MCV 88.2 today.  Ferritin was 19 on 09/08/2019.  She received weekly Venofer x 3 (last 10/06/2019).  Continue to monitor. 5. B12 deficiency  B12 was 240 on 09/08/2019.  Check B12 level after 1 month on supplementation. 6.   RTC on 10/23/2019 for labs (CBC with diff), IVF, +/- Granix. 7.   RTC in 1 week for NP assessment, labs (CBC with diff, CMP, Mg, urine pregnancy test) and week #8 Taxol and +/- carboplatin.   I discussed the assessment and treatment plan with the patient.  The patient was provided an opportunity to ask  questions and all were answered.  The patient agreed with the plan and demonstrated an understanding of the instructions.  The patient was advised to call back if the symptoms worsen or if the condition fails to improve as anticipated.   Lequita Asal, MD, PhD    10/20/2019, 9:24 AM  I, Selena Batten, am acting as scribe for [employer]. Mike Gip, MD, PhD.  I, Teshara Moree C. Mike Gip, MD, have reviewed the above documentation for accuracy and completeness, and I agree with the above.

## 2019-10-19 ENCOUNTER — Ambulatory Visit: Payer: BC Managed Care – PPO

## 2019-10-19 ENCOUNTER — Other Ambulatory Visit: Payer: BC Managed Care – PPO

## 2019-10-19 ENCOUNTER — Ambulatory Visit: Payer: BC Managed Care – PPO | Admitting: Hematology and Oncology

## 2019-10-20 ENCOUNTER — Inpatient Hospital Stay: Payer: BC Managed Care – PPO

## 2019-10-20 ENCOUNTER — Encounter: Payer: Self-pay | Admitting: Hematology and Oncology

## 2019-10-20 ENCOUNTER — Inpatient Hospital Stay: Payer: BC Managed Care – PPO | Attending: Hematology and Oncology | Admitting: Hematology and Oncology

## 2019-10-20 ENCOUNTER — Other Ambulatory Visit: Payer: Self-pay

## 2019-10-20 VITALS — BP 111/70 | HR 102 | Resp 18

## 2019-10-20 VITALS — BP 94/66 | HR 95 | Resp 18 | Ht 66.0 in | Wt 135.5 lb

## 2019-10-20 DIAGNOSIS — E876 Hypokalemia: Secondary | ICD-10-CM

## 2019-10-20 DIAGNOSIS — Z5111 Encounter for antineoplastic chemotherapy: Secondary | ICD-10-CM | POA: Diagnosis not present

## 2019-10-20 DIAGNOSIS — Z171 Estrogen receptor negative status [ER-]: Secondary | ICD-10-CM | POA: Diagnosis not present

## 2019-10-20 DIAGNOSIS — K219 Gastro-esophageal reflux disease without esophagitis: Secondary | ICD-10-CM | POA: Insufficient documentation

## 2019-10-20 DIAGNOSIS — Z20822 Contact with and (suspected) exposure to covid-19: Secondary | ICD-10-CM | POA: Insufficient documentation

## 2019-10-20 DIAGNOSIS — D509 Iron deficiency anemia, unspecified: Secondary | ICD-10-CM

## 2019-10-20 DIAGNOSIS — Z791 Long term (current) use of non-steroidal anti-inflammatories (NSAID): Secondary | ICD-10-CM | POA: Insufficient documentation

## 2019-10-20 DIAGNOSIS — E785 Hyperlipidemia, unspecified: Secondary | ICD-10-CM | POA: Insufficient documentation

## 2019-10-20 DIAGNOSIS — E538 Deficiency of other specified B group vitamins: Secondary | ICD-10-CM | POA: Diagnosis not present

## 2019-10-20 DIAGNOSIS — C50511 Malignant neoplasm of lower-outer quadrant of right female breast: Secondary | ICD-10-CM | POA: Diagnosis present

## 2019-10-20 DIAGNOSIS — R7989 Other specified abnormal findings of blood chemistry: Secondary | ICD-10-CM

## 2019-10-20 DIAGNOSIS — R110 Nausea: Secondary | ICD-10-CM | POA: Insufficient documentation

## 2019-10-20 DIAGNOSIS — E119 Type 2 diabetes mellitus without complications: Secondary | ICD-10-CM | POA: Diagnosis not present

## 2019-10-20 DIAGNOSIS — F319 Bipolar disorder, unspecified: Secondary | ICD-10-CM | POA: Insufficient documentation

## 2019-10-20 DIAGNOSIS — C50919 Malignant neoplasm of unspecified site of unspecified female breast: Secondary | ICD-10-CM

## 2019-10-20 DIAGNOSIS — Z7951 Long term (current) use of inhaled steroids: Secondary | ICD-10-CM | POA: Insufficient documentation

## 2019-10-20 DIAGNOSIS — E2740 Unspecified adrenocortical insufficiency: Secondary | ICD-10-CM

## 2019-10-20 DIAGNOSIS — Z7984 Long term (current) use of oral hypoglycemic drugs: Secondary | ICD-10-CM | POA: Insufficient documentation

## 2019-10-20 DIAGNOSIS — Z79899 Other long term (current) drug therapy: Secondary | ICD-10-CM | POA: Diagnosis not present

## 2019-10-20 LAB — CBC WITH DIFFERENTIAL/PLATELET
Abs Immature Granulocytes: 0.01 10*3/uL (ref 0.00–0.07)
Basophils Absolute: 0 10*3/uL (ref 0.0–0.1)
Basophils Relative: 1 %
Eosinophils Absolute: 0.1 10*3/uL (ref 0.0–0.5)
Eosinophils Relative: 1 %
HCT: 34.5 % — ABNORMAL LOW (ref 36.0–46.0)
Hemoglobin: 11.3 g/dL — ABNORMAL LOW (ref 12.0–15.0)
Immature Granulocytes: 0 %
Lymphocytes Relative: 54 %
Lymphs Abs: 1.9 10*3/uL (ref 0.7–4.0)
MCH: 28.9 pg (ref 26.0–34.0)
MCHC: 32.8 g/dL (ref 30.0–36.0)
MCV: 88.2 fL (ref 80.0–100.0)
Monocytes Absolute: 0.3 10*3/uL (ref 0.1–1.0)
Monocytes Relative: 8 %
Neutro Abs: 1.3 10*3/uL — ABNORMAL LOW (ref 1.7–7.7)
Neutrophils Relative %: 36 %
Platelets: 258 10*3/uL (ref 150–400)
RBC: 3.91 MIL/uL (ref 3.87–5.11)
RDW: 16.6 % — ABNORMAL HIGH (ref 11.5–15.5)
WBC: 3.6 10*3/uL — ABNORMAL LOW (ref 4.0–10.5)
nRBC: 0 % (ref 0.0–0.2)

## 2019-10-20 LAB — COMPREHENSIVE METABOLIC PANEL
ALT: 14 U/L (ref 0–44)
AST: 17 U/L (ref 15–41)
Albumin: 4.2 g/dL (ref 3.5–5.0)
Alkaline Phosphatase: 48 U/L (ref 38–126)
Anion gap: 9 (ref 5–15)
BUN: 10 mg/dL (ref 6–20)
CO2: 22 mmol/L (ref 22–32)
Calcium: 9.3 mg/dL (ref 8.9–10.3)
Chloride: 106 mmol/L (ref 98–111)
Creatinine, Ser: 0.63 mg/dL (ref 0.44–1.00)
GFR calc Af Amer: >60 mL/min (ref >60)
GFR calc non Af Amer: >60 mL/min (ref >60)
Glucose, Bld: 90 mg/dL (ref 70–99)
Potassium: 3.8 mmol/L (ref 3.5–5.1)
Sodium: 137 mmol/L (ref 135–145)
Total Bilirubin: 0.6 mg/dL (ref 0.3–1.2)
Total Protein: 7.2 g/dL (ref 6.5–8.1)

## 2019-10-20 LAB — PREGNANCY, URINE: Preg Test, Ur: NEGATIVE

## 2019-10-20 LAB — MAGNESIUM: Magnesium: 2.1 mg/dL (ref 1.7–2.4)

## 2019-10-20 MED ORDER — HEPARIN SOD (PORK) LOCK FLUSH 100 UNIT/ML IV SOLN
500.0000 [IU] | Freq: Once | INTRAVENOUS | Status: AC
  Administered 2019-10-20: 13:00:00 500 [IU] via INTRAVENOUS
  Filled 2019-10-20: qty 5

## 2019-10-20 MED ORDER — PALONOSETRON HCL INJECTION 0.25 MG/5ML
0.2500 mg | Freq: Once | INTRAVENOUS | Status: AC
  Administered 2019-10-20: 10:00:00 0.25 mg via INTRAVENOUS
  Filled 2019-10-20: qty 5

## 2019-10-20 MED ORDER — DIPHENHYDRAMINE HCL 50 MG/ML IJ SOLN
50.0000 mg | Freq: Once | INTRAMUSCULAR | Status: AC
  Administered 2019-10-20: 50 mg via INTRAVENOUS
  Filled 2019-10-20: qty 1

## 2019-10-20 MED ORDER — SODIUM CHLORIDE 0.9 % IV SOLN
80.0000 mg/m2 | Freq: Once | INTRAVENOUS | Status: AC
  Administered 2019-10-20: 132 mg via INTRAVENOUS
  Filled 2019-10-20: qty 22

## 2019-10-20 MED ORDER — SODIUM CHLORIDE 0.9% FLUSH
10.0000 mL | INTRAVENOUS | Status: DC | PRN
  Administered 2019-10-20: 13:00:00 10 mL via INTRAVENOUS
  Filled 2019-10-20: qty 10

## 2019-10-20 MED ORDER — SODIUM CHLORIDE 0.9 % IV SOLN
Freq: Once | INTRAVENOUS | Status: AC
  Filled 2019-10-20: qty 250

## 2019-10-20 MED ORDER — FAMOTIDINE IN NACL 20-0.9 MG/50ML-% IV SOLN
20.0000 mg | Freq: Once | INTRAVENOUS | Status: AC
  Administered 2019-10-20: 11:00:00 20 mg via INTRAVENOUS
  Filled 2019-10-20: qty 50

## 2019-10-20 MED ORDER — SODIUM CHLORIDE 0.9 % IV SOLN
10.0000 mg | Freq: Once | INTRAVENOUS | Status: AC
  Administered 2019-10-20: 10 mg via INTRAVENOUS
  Filled 2019-10-20: qty 10

## 2019-10-20 NOTE — Progress Notes (Signed)
ANC 1.3, ok per MD wrap up note (not getting [place] today)

## 2019-10-20 NOTE — Patient Instructions (Signed)
Paclitaxel injection What is this medicine? PACLITAXEL (PAK li TAX el) is a chemotherapy drug. It targets fast dividing cells, like cancer cells, and causes these cells to die. This medicine is used to treat ovarian cancer, breast cancer, lung cancer, Kaposi's sarcoma, and other cancers. This medicine may be used for other purposes; ask your health care provider or pharmacist if you have questions. COMMON BRAND NAME(S): Onxol, Taxol What should I tell my health care provider before I take this medicine? They need to know if you have any of these conditions:  history of irregular heartbeat  liver disease  low blood counts, like low white cell, platelet, or red cell counts  lung or breathing disease, like asthma  tingling of the fingers or toes, or other nerve disorder  an unusual or allergic reaction to paclitaxel, alcohol, polyoxyethylated castor oil, other chemotherapy, other medicines, foods, dyes, or preservatives  pregnant or trying to get pregnant  breast-feeding How should I use this medicine? This drug is given as an infusion into a vein. It is administered in a hospital or clinic by a specially trained health care professional. Talk to your pediatrician regarding the use of this medicine in children. Special care may be needed. Overdosage: If you think you have taken too much of this medicine contact a poison control center or emergency room at once. NOTE: This medicine is only for you. Do not share this medicine with others. What if I miss a dose? It is important not to miss your dose. Call your doctor or health care professional if you are unable to keep an appointment. What may interact with this medicine? Do not take this medicine with any of the following medications:  disulfiram  metronidazole This medicine may also interact with the following medications:  antiviral medicines for hepatitis, HIV or AIDS  certain antibiotics like erythromycin and  clarithromycin  certain medicines for fungal infections like ketoconazole and itraconazole  certain medicines for seizures like carbamazepine, phenobarbital, phenytoin  gemfibrozil  nefazodone  rifampin  St. John's wort This list may not describe all possible interactions. Give your health care provider a list of all the medicines, herbs, non-prescription drugs, or dietary supplements you use. Also tell them if you smoke, drink alcohol, or use illegal drugs. Some items may interact with your medicine. What should I watch for while using this medicine? Your condition will be monitored carefully while you are receiving this medicine. You will need important blood work done while you are taking this medicine. This medicine can cause serious allergic reactions. To reduce your risk you will need to take other medicine(s) before treatment with this medicine. If you experience allergic reactions like skin rash, itching or hives, swelling of the face, lips, or tongue, tell your doctor or health care professional right away. In some cases, you may be given additional medicines to help with side effects. Follow all directions for their use. This drug may make you feel generally unwell. This is not uncommon, as chemotherapy can affect healthy cells as well as cancer cells. Report any side effects. Continue your course of treatment even though you feel ill unless your doctor tells you to stop. Call your doctor or health care professional for advice if you get a fever, chills or sore throat, or other symptoms of a cold or flu. Do not treat yourself. This drug decreases your body's ability to fight infections. Try to avoid being around people who are sick. This medicine may increase your risk to bruise   or bleed. Call your doctor or health care professional if you notice any unusual bleeding. Be careful brushing and flossing your teeth or using a toothpick because you may get an infection or bleed more easily.  If you have any dental work done, tell your dentist you are receiving this medicine. Avoid taking products that contain aspirin, acetaminophen, ibuprofen, naproxen, or ketoprofen unless instructed by your doctor. These medicines may hide a fever. Do not become pregnant while taking this medicine. Women should inform their doctor if they wish to become pregnant or think they might be pregnant. There is a potential for serious side effects to an unborn child. Talk to your health care professional or pharmacist for more information. Do not breast-feed an infant while taking this medicine. Men are advised not to father a child while receiving this medicine. This product may contain alcohol. Ask your pharmacist or healthcare provider if this medicine contains alcohol. Be sure to tell all healthcare providers you are taking this medicine. Certain medicines, like metronidazole and disulfiram, can cause an unpleasant reaction when taken with alcohol. The reaction includes flushing, headache, nausea, vomiting, sweating, and increased thirst. The reaction can last from 30 minutes to several hours. What side effects may I notice from receiving this medicine? Side effects that you should report to your doctor or health care professional as soon as possible:  allergic reactions like skin rash, itching or hives, swelling of the face, lips, or tongue  breathing problems  changes in vision  fast, irregular heartbeat  high or low blood pressure  mouth sores  pain, tingling, numbness in the hands or feet  signs of decreased platelets or bleeding - bruising, pinpoint red spots on the skin, black, tarry stools, blood in the urine  signs of decreased red blood cells - unusually weak or tired, feeling faint or lightheaded, falls  signs of infection - fever or chills, cough, sore throat, pain or difficulty passing urine  signs and symptoms of liver injury like dark yellow or brown urine; general ill feeling or  flu-like symptoms; light-colored stools; loss of appetite; nausea; right upper belly pain; unusually weak or tired; yellowing of the eyes or skin  swelling of the ankles, feet, hands  unusually slow heartbeat Side effects that usually do not require medical attention (report to your doctor or health care professional if they continue or are bothersome):  diarrhea  hair loss  loss of appetite  muscle or joint pain  nausea, vomiting  pain, redness, or irritation at site where injected  tiredness This list may not describe all possible side effects. Call your doctor for medical advice about side effects. You may report side effects to FDA at 1-800-FDA-1088. Where should I keep my medicine? This drug is given in a hospital or clinic and will not be stored at home. NOTE: This sheet is a summary. It may not cover all possible information. If you have questions about this medicine, talk to your doctor, pharmacist, or health care provider.  2020 Elsevier/Gold Standard (2017-04-09 13:14:55)  

## 2019-10-20 NOTE — Progress Notes (Signed)
The patient c/o left shoulder pain. She can't give a clear pain scale. It varies with movement.

## 2019-10-21 ENCOUNTER — Telehealth: Payer: Self-pay

## 2019-10-21 DIAGNOSIS — E538 Deficiency of other specified B group vitamins: Secondary | ICD-10-CM

## 2019-10-21 NOTE — Telephone Encounter (Signed)
Spoke with the patient to see is she currently taking 1000 mcg B-12 daily. the patient states yes she has been taking B-12 since 09/09/2019. I will add a b-12 level to be drawned on next lab day.The patient was understanding and agreeable.

## 2019-10-23 ENCOUNTER — Inpatient Hospital Stay: Payer: BC Managed Care – PPO

## 2019-10-23 ENCOUNTER — Other Ambulatory Visit: Payer: Self-pay

## 2019-10-23 ENCOUNTER — Other Ambulatory Visit: Payer: Self-pay | Admitting: Hematology and Oncology

## 2019-10-23 VITALS — BP 102/67 | HR 88 | Temp 96.4°F | Resp 18

## 2019-10-23 DIAGNOSIS — D509 Iron deficiency anemia, unspecified: Secondary | ICD-10-CM

## 2019-10-23 DIAGNOSIS — C50511 Malignant neoplasm of lower-outer quadrant of right female breast: Secondary | ICD-10-CM | POA: Diagnosis not present

## 2019-10-23 DIAGNOSIS — C50919 Malignant neoplasm of unspecified site of unspecified female breast: Secondary | ICD-10-CM

## 2019-10-23 DIAGNOSIS — E538 Deficiency of other specified B group vitamins: Secondary | ICD-10-CM

## 2019-10-23 LAB — CBC WITH DIFFERENTIAL/PLATELET
Abs Immature Granulocytes: 0.01 10*3/uL (ref 0.00–0.07)
Basophils Absolute: 0 10*3/uL (ref 0.0–0.1)
Basophils Relative: 1 %
Eosinophils Absolute: 0.1 10*3/uL (ref 0.0–0.5)
Eosinophils Relative: 2 %
HCT: 34 % — ABNORMAL LOW (ref 36.0–46.0)
Hemoglobin: 10.8 g/dL — ABNORMAL LOW (ref 12.0–15.0)
Immature Granulocytes: 0 %
Lymphocytes Relative: 48 %
Lymphs Abs: 2 10*3/uL (ref 0.7–4.0)
MCH: 28.3 pg (ref 26.0–34.0)
MCHC: 31.8 g/dL (ref 30.0–36.0)
MCV: 89 fL (ref 80.0–100.0)
Monocytes Absolute: 0.2 10*3/uL (ref 0.1–1.0)
Monocytes Relative: 5 %
Neutro Abs: 1.8 10*3/uL (ref 1.7–7.7)
Neutrophils Relative %: 44 %
Platelets: 193 10*3/uL (ref 150–400)
RBC: 3.82 MIL/uL — ABNORMAL LOW (ref 3.87–5.11)
RDW: 16.7 % — ABNORMAL HIGH (ref 11.5–15.5)
WBC: 4.1 10*3/uL (ref 4.0–10.5)
nRBC: 0 % (ref 0.0–0.2)

## 2019-10-23 LAB — VITAMIN B12: Vitamin B-12: 396 pg/mL (ref 180–914)

## 2019-10-23 MED ORDER — SODIUM CHLORIDE 0.9% FLUSH
10.0000 mL | Freq: Once | INTRAVENOUS | Status: AC | PRN
  Administered 2019-10-23: 10 mL
  Filled 2019-10-23: qty 10

## 2019-10-23 MED ORDER — HEPARIN SOD (PORK) LOCK FLUSH 100 UNIT/ML IV SOLN
500.0000 [IU] | Freq: Once | INTRAVENOUS | Status: AC | PRN
  Administered 2019-10-23: 500 [IU]
  Filled 2019-10-23: qty 5

## 2019-10-23 MED ORDER — SODIUM CHLORIDE 0.9 % IV SOLN
Freq: Once | INTRAVENOUS | Status: AC
  Filled 2019-10-23: qty 250

## 2019-10-23 MED ORDER — LIDOCAINE-PRILOCAINE 2.5-2.5 % EX CREA: TOPICAL_CREAM | CUTANEOUS | 2 refills | Status: DC

## 2019-10-27 ENCOUNTER — Other Ambulatory Visit: Payer: Self-pay

## 2019-10-27 ENCOUNTER — Inpatient Hospital Stay (HOSPITAL_BASED_OUTPATIENT_CLINIC_OR_DEPARTMENT_OTHER): Payer: BC Managed Care – PPO | Admitting: Oncology

## 2019-10-27 ENCOUNTER — Inpatient Hospital Stay: Payer: BC Managed Care – PPO

## 2019-10-27 ENCOUNTER — Encounter: Payer: Self-pay | Admitting: Oncology

## 2019-10-27 ENCOUNTER — Encounter: Payer: Self-pay | Admitting: Hematology and Oncology

## 2019-10-27 VITALS — Temp 98.2°F | Resp 18 | Ht 66.0 in | Wt 136.4 lb

## 2019-10-27 VITALS — BP 111/74 | HR 101 | Temp 99.0°F | Resp 16

## 2019-10-27 DIAGNOSIS — C50511 Malignant neoplasm of lower-outer quadrant of right female breast: Secondary | ICD-10-CM | POA: Diagnosis not present

## 2019-10-27 DIAGNOSIS — E860 Dehydration: Secondary | ICD-10-CM

## 2019-10-27 DIAGNOSIS — C50919 Malignant neoplasm of unspecified site of unspecified female breast: Secondary | ICD-10-CM

## 2019-10-27 DIAGNOSIS — D701 Agranulocytosis secondary to cancer chemotherapy: Secondary | ICD-10-CM | POA: Diagnosis not present

## 2019-10-27 DIAGNOSIS — Z5111 Encounter for antineoplastic chemotherapy: Secondary | ICD-10-CM

## 2019-10-27 DIAGNOSIS — I951 Orthostatic hypotension: Secondary | ICD-10-CM | POA: Diagnosis not present

## 2019-10-27 DIAGNOSIS — T451X5A Adverse effect of antineoplastic and immunosuppressive drugs, initial encounter: Secondary | ICD-10-CM

## 2019-10-27 LAB — MAGNESIUM: Magnesium: 2 mg/dL (ref 1.7–2.4)

## 2019-10-27 LAB — COMPREHENSIVE METABOLIC PANEL
ALT: 17 U/L (ref 0–44)
AST: 20 U/L (ref 15–41)
Albumin: 4.1 g/dL (ref 3.5–5.0)
Alkaline Phosphatase: 45 U/L (ref 38–126)
Anion gap: 12 (ref 5–15)
BUN: 12 mg/dL (ref 6–20)
CO2: 19 mmol/L — ABNORMAL LOW (ref 22–32)
Calcium: 9.2 mg/dL (ref 8.9–10.3)
Chloride: 106 mmol/L (ref 98–111)
Creatinine, Ser: 0.7 mg/dL (ref 0.44–1.00)
GFR calc Af Amer: >60 mL/min (ref >60)
GFR calc non Af Amer: >60 mL/min (ref >60)
Glucose, Bld: 127 mg/dL — ABNORMAL HIGH (ref 70–99)
Potassium: 3.6 mmol/L (ref 3.5–5.1)
Sodium: 137 mmol/L (ref 135–145)
Total Bilirubin: 0.5 mg/dL (ref 0.3–1.2)
Total Protein: 7.1 g/dL (ref 6.5–8.1)

## 2019-10-27 LAB — CBC WITH DIFFERENTIAL/PLATELET
Abs Immature Granulocytes: 0.02 10*3/uL (ref 0.00–0.07)
Basophils Absolute: 0 10*3/uL (ref 0.0–0.1)
Basophils Relative: 1 %
Eosinophils Absolute: 0.1 10*3/uL (ref 0.0–0.5)
Eosinophils Relative: 1 %
HCT: 33 % — ABNORMAL LOW (ref 36.0–46.0)
Hemoglobin: 10.7 g/dL — ABNORMAL LOW (ref 12.0–15.0)
Immature Granulocytes: 1 %
Lymphocytes Relative: 54 %
Lymphs Abs: 2.3 10*3/uL (ref 0.7–4.0)
MCH: 28.9 pg (ref 26.0–34.0)
MCHC: 32.4 g/dL (ref 30.0–36.0)
MCV: 89.2 fL (ref 80.0–100.0)
Monocytes Absolute: 0.2 10*3/uL (ref 0.1–1.0)
Monocytes Relative: 4 %
Neutro Abs: 1.6 10*3/uL — ABNORMAL LOW (ref 1.7–7.7)
Neutrophils Relative %: 39 %
Platelets: 210 10*3/uL (ref 150–400)
RBC: 3.7 MIL/uL — ABNORMAL LOW (ref 3.87–5.11)
RDW: 16.5 % — ABNORMAL HIGH (ref 11.5–15.5)
WBC: 4.2 10*3/uL (ref 4.0–10.5)
nRBC: 0 % (ref 0.0–0.2)

## 2019-10-27 LAB — PREGNANCY, URINE: Preg Test, Ur: NEGATIVE

## 2019-10-27 MED ORDER — SODIUM CHLORIDE 0.9 % IV SOLN
Freq: Once | INTRAVENOUS | Status: AC
  Filled 2019-10-27: qty 250

## 2019-10-27 MED ORDER — PALONOSETRON HCL INJECTION 0.25 MG/5ML
0.2500 mg | Freq: Once | INTRAVENOUS | Status: AC
  Administered 2019-10-27: 0.25 mg via INTRAVENOUS
  Filled 2019-10-27: qty 5

## 2019-10-27 MED ORDER — DIPHENHYDRAMINE HCL 50 MG/ML IJ SOLN
50.0000 mg | Freq: Once | INTRAMUSCULAR | Status: AC
  Administered 2019-10-27: 11:00:00 50 mg via INTRAVENOUS
  Filled 2019-10-27: qty 1

## 2019-10-27 MED ORDER — SODIUM CHLORIDE 0.9% FLUSH
10.0000 mL | INTRAVENOUS | Status: DC | PRN
  Administered 2019-10-27: 10 mL via INTRAVENOUS
  Filled 2019-10-27: qty 10

## 2019-10-27 MED ORDER — SODIUM CHLORIDE 0.9 % IV SOLN
INTRAVENOUS | Status: DC
  Filled 2019-10-27 (×2): qty 250

## 2019-10-27 MED ORDER — HEPARIN SOD (PORK) LOCK FLUSH 100 UNIT/ML IV SOLN
500.0000 [IU] | Freq: Once | INTRAVENOUS | Status: AC
  Administered 2019-10-27: 500 [IU] via INTRAVENOUS
  Filled 2019-10-27: qty 5

## 2019-10-27 MED ORDER — FAMOTIDINE IN NACL 20-0.9 MG/50ML-% IV SOLN
20.0000 mg | Freq: Once | INTRAVENOUS | Status: AC
  Administered 2019-10-27: 20 mg via INTRAVENOUS
  Filled 2019-10-27: qty 50

## 2019-10-27 MED ORDER — SODIUM CHLORIDE 0.9 % IV SOLN
80.0000 mg/m2 | Freq: Once | INTRAVENOUS | Status: DC
  Filled 2019-10-27 (×2): qty 22

## 2019-10-27 MED ORDER — SODIUM CHLORIDE 0.9 % IV SOLN
80.0000 mg/m2 | Freq: Once | INTRAVENOUS | Status: AC
  Administered 2019-10-27: 132 mg via INTRAVENOUS
  Filled 2019-10-27: qty 22

## 2019-10-27 MED ORDER — SODIUM CHLORIDE 0.9 % IV SOLN
10.0000 mg | Freq: Once | INTRAVENOUS | Status: AC
  Administered 2019-10-27: 11:00:00 10 mg via INTRAVENOUS
  Filled 2019-10-27: qty 10

## 2019-10-27 NOTE — Progress Notes (Signed)
No carbo today. 3/9 , keep on aloxi due to N/V

## 2019-10-27 NOTE — Progress Notes (Signed)
This encounter was created in error - please disregard.

## 2019-10-27 NOTE — Progress Notes (Signed)
No new changes noted today 

## 2019-10-27 NOTE — Progress Notes (Signed)
Per NP Rulon Abide and Dr Janese Banks No carboplatin today. Patient will receive Taxol today. Granix tomorrow and Return Friday for CBC and possible granix. Please give aloxi with Taxol today as patient has tendency for N/V.

## 2019-10-27 NOTE — Patient Instructions (Signed)
Tbo-Filgrastim injection What is this medicine? TBO-FILGRASTIM (T B O fil GRA stim) is a granulocyte colony-stimulating factor that helps you make more neutrophils, a type of white blood cell. Neutrophils are important for fighting infections. Some chemotherapy affects your bone marrow and lowers your neutrophils. This medicine helps decrease the length of time that neutrophils are very low (severe neutropenia). This medicine may be used for other purposes; ask your health care provider or pharmacist if you have questions. COMMON BRAND NAME(S): Granix What should I tell my health care provider before I take this medicine? They need to know if you have any of these conditions:  bone scan or tests planned  kidney disease  sickle cell anemia  an unusual or allergic reaction to tbo-filgrastim, filgrastim, pegfilgrastim, other medicines, foods, dyes, or preservatives  pregnant or trying to get pregnant  breast-feeding How should I use this medicine? This medicine is for injection under the skin. If you get this medicine at home, you will be taught how to prepare and give this medicine. Refer to the Instructions for Use that come with your medication packaging. Use exactly as directed. Take your medicine at regular intervals. Do not take your medicine more often than directed. It is important that you put your used needles and syringes in a special sharps container. Do not put them in a trash can. If you do not have a sharps container, call your pharmacist or healthcare provider to get one. Talk to your pediatrician regarding the use of this medicine in children. While this drug may be prescribed for children as young as 1 month of age for selected conditions, precautions do apply. Overdosage: If you think you have taken too much of this medicine contact a poison control center or emergency room at once. NOTE: This medicine is only for you. Do not share this medicine with others. What if I miss a  dose? It is important not to miss your dose. Call your doctor or health care professional if you miss a dose. What may interact with this medicine? This medicine may interact with the following medications:  medicines that may cause a release of neutrophils, such as lithium This list may not describe all possible interactions. Give your health care provider a list of all the medicines, herbs, non-prescription drugs, or dietary supplements you use. Also tell them if you smoke, drink alcohol, or use illegal drugs. Some items may interact with your medicine. What should I watch for while using this medicine? You may need blood work done while you are taking this medicine. What side effects may I notice from receiving this medicine? Side effects that you should report to your doctor or health care professional as soon as possible:  allergic reactions like skin rash, itching or hives, swelling of the face, lips, or tongue  back pain  blood in the urine  dark urine  dizziness  fast heartbeat  feeling faint  shortness of breath or breathing problems  signs and symptoms of infection like fever or chills; cough; or sore throat  signs and symptoms of kidney injury like trouble passing urine or change in the amount of urine  stomach or side pain, or pain at the shoulder  sweating  swelling of the legs, ankles, or abdomen  tiredness Side effects that usually do not require medical attention (report to your doctor or health care professional if they continue or are bothersome):  bone pain  diarrhea  headache  muscle pain  vomiting This list may   not describe all possible side effects. Call your doctor for medical advice about side effects. You may report side effects to FDA at 1-800-FDA-1088. Where should I keep my medicine? Keep out of the reach of children. Store in a refrigerator between 2 and 8 degrees C (36 and 46 degrees F). Keep in carton to protect from light. Throw  away this medicine if it is left out of the refrigerator for more than 5 consecutive days. Throw away any unused medicine after the expiration date. NOTE: This sheet is a summary. It may not cover all possible information. If you have questions about this medicine, talk to your doctor, pharmacist, or health care provider.  2020 Elsevier/Gold Standard (2018-06-07 19:58:39)  

## 2019-10-27 NOTE — Progress Notes (Signed)
[facility]  [address], [address] [place], [place] [address] Phone: [phone]  Fax: [phone]   Clinic Day:  10/27/2019  Referring physician: Center, [place]  Chief Complaint: Heather Bell is a 44 y.o. female with clinical stage IIBtriple negativerightbreast cancer who is seen for assessment prior to week #8 of weekly Taxol + carboplatin.  HPI: The patient was last seen in the medical oncology clinic on 10/20/2019 prior to cycle 7 of Taxol plus carboplatin.  She admitted to trouble with her left shoulder.  She appeared to be tolerating treatment well except for neutropenia.  Plan was for weekly carbo plus Taxol x12 cycles followed by [facility] x4 with Neulasta.  Counts appeared slightly low with an ANC of 1300.  She had missed 2 weeks of treatment prior due to COVID-19 exposure.  She was given single agent Taxol instead.  They discussed possible Granix in the future given her counts were persistently low.  She received 1000 mL normal saline in infusion on 10/23/2019.  Labs revealed an [place] of 1800.  Granix was held.  She was evaluated by Dr. Gabriel Carina endocrinologist on 10/22/2019 for secondary adrenal insufficiency in the setting of recurrent weekly Decadron for chemotherapy.  She received Cortrosyn injection 250 Mcg.  Plan is for her to begin hydrocortisone to treat secondary AI with the plan to eventually taper off as she completes her course of chemotherapy.  During the interim, she has felt "fine".  She eats approximately 2 meals daily followed by snacks.  She is drinking as much fluids as she can. Her weight is stable.  She had one episode of nausea during her last infusion but none at home.  She has regular bowel movements and denies any urinary concerns. She denies any neuropathy in her fingers and toes.  She is persistently orthostatic while in clinic.  Past Medical History:  Diagnosis Date  . Allergic rhinitis   . Anxiety   . Bipolar disorder (Heather Bell)    depression  . Cancer [facility])    breast right  . Depression   . Diabetes ([place])   . GERD (gastroesophageal reflux disease)   . Hyperlipidemia     Past Surgical History:  Procedure Laterality Date  . BREAST BIOPSY Right 07/22/2019   mass bx, path pending, heart marker  . BREAST BIOPSY Right 07/22/2019   LN bx, path pending,  butterfly hydromarker  . IMAGE GUIDED SINUS SURGERY    . KNEE SURGERY    . NASAL SEPTUM SURGERY    . PORTACATH PLACEMENT N/A 08/07/2019   Procedure: INSERTION PORT-A-CATH;  Surgeon: Herbert Pun, MD;  Location: [facility] ORS;  Service: General;  Laterality: N/A;  . TONSILLECTOMY      Family History  Problem Relation Age of Onset  . Lung cancer Father   . Heart attack Father   . Lung cancer Paternal Uncle   . Heart disease Paternal Uncle   . Lung cancer Paternal Aunt     Social History:  reports that she has never smoked. She has never used smokeless tobacco. She reports that she does not drink alcohol or use drugs. Deniesany exposure to radiation or toxins.She has 2 children (20 year old daughter and 84 year old son).She use to work inthefast foodindustry. She is currently not working. Her husbandisMichael.Bell Como travels a lot for his job. The patient is alone today.  Allergies:  Allergies  Allergen Reactions  . Compazine [Prochlorperazine Edisylate] Other (See Comments)    Acute dystonic reaction  .  Morphine And Related Shortness Of Breath  . Doxycycline Other (See Comments)    "made all my symptoms worse"   . Fluticasone Other (See Comments)    Doesn't work  . Levofloxacin Other (See Comments)    "made all my symptoms worse"     Current Medications: Current Outpatient Medications  Medication Sig Dispense Refill  . amphetamine-dextroamphetamine (ADDERALL XR) 25 MG 24 hr capsule Take 25 mg by mouth daily.    . Calcium Carbonate-Vitamin D (CALCIUM 500 + D) 500-125 MG-UNIT TABS Take 1 tablet by mouth daily.    . cetirizine (ZYRTEC) 10  MG tablet Take 10 mg by mouth daily.     . clonazePAM (KLONOPIN) 1 MG tablet Take 1 mg by mouth 3 (three) times daily as needed for anxiety.    . docusate sodium (COLACE) 100 MG capsule Take 100 mg by mouth 2 (two) times daily.    Marland Kitchen doxepin (SINEQUAN) 25 MG capsule Take 25 mg by mouth at bedtime.     Marland Kitchen ibuprofen (ADVIL) 200 MG tablet Take 400-800 mg by mouth every 6 (six) hours as needed for headache or moderate pain.    Marland Kitchen lamoTRIgine (LAMICTAL) 200 MG tablet Take 200 mg by mouth at bedtime.     . lidocaine-prilocaine (EMLA) cream Apply topically over port-a-cath 30 minutes to 1 hour prior to treatment. 30 g 2  . LORazepam (ATIVAN) 0.5 MG tablet Take 1 tablet (0.5 mg total) by mouth every 6 (six) hours as needed (Nausea or vomiting). 20 tablet 0  . lurasidone (LATUDA) 40 MG TABS tablet Take 40 mg by mouth at bedtime. Take with 60 mg to equal 100 mg at night    . Lurasidone HCl 60 MG TABS Take 60 mg by mouth at bedtime. Take with 40 mg to equal 100 mg at night    . metFORMIN (GLUCOPHAGE-XR) 500 MG 24 hr tablet Take 1,000 mg by mouth 2 (two) times daily.    . mometasone (NASONEX) 50 MCG/ACT nasal spray Place 2 sprays into the nose daily as needed (allergies).     . Multiple Vitamin (MULTIVITAMIN WITH MINERALS) TABS tablet Take 1 tablet by mouth daily.    Marland Kitchen omeprazole (PRILOSEC) 20 MG capsule Take 20 mg by mouth daily.     . ondansetron (ZOFRAN) 8 MG tablet Take 1 tablet (8 mg total) by mouth 2 (two) times daily as needed for refractory nausea / vomiting. Start on day 3 after carboplatin chemo. (Patient not taking: Reported on 10/12/2019) 30 tablet 1  . potassium chloride (KLOR-CON) 10 MEQ tablet Take 1 tablet (10 mEq total) by mouth daily. 30 tablet 0  . QUEtiapine (SEROQUEL XR) 300 MG 24 hr tablet Take 300 mg by mouth at bedtime.    . simvastatin (ZOCOR) 20 MG tablet Take 20 mg by mouth daily.    Marland Kitchen topiramate (TOPAMAX) 100 MG tablet Take 50-100 mg by mouth See admin instructions. Take 100 mg in the  morning and 50 mg at night    . vitamin B-12 (CYANOCOBALAMIN) 1000 MCG tablet Take 1,000 mcg by mouth daily.     No current facility-administered medications for this visit.   Facility-Administered Medications Ordered in Other Visits  Medication Dose Route Frequency Provider Last Rate Last Admin  . heparin lock flush 100 unit/mL  500 Units Intravenous Once [name] E, NP      . sodium chloride flush (NS) 0.9 % injection 10 mL  10 mL Intravenous PRN Jacquelin Hawking, NP   10  mL at 10/27/19 0855    Review of Systems  Constitutional: Positive for malaise/fatigue. Negative for chills, fever and weight loss.  HENT: Negative for congestion, ear pain and tinnitus.   Eyes: Negative.  Negative for blurred vision and double vision.  Respiratory: Negative.  Negative for cough, sputum production and shortness of breath.   Cardiovascular: Negative.  Negative for chest pain, palpitations and leg swelling.  Gastrointestinal: Negative.  Negative for abdominal pain, constipation, diarrhea, nausea and vomiting.  Genitourinary: Negative for dysuria, frequency and urgency.  Musculoskeletal: Negative for back pain and falls.  Skin: Negative.  Negative for rash.  Neurological: Positive for dizziness and weakness. Negative for headaches.  Endo/Heme/Allergies: Negative.  Does not bruise/bleed easily.  Psychiatric/Behavioral: Negative for depression. The patient is nervous/anxious. The patient does not have insomnia.     Performance status (ECOG): 1  Vitals Temperature 98.2 F (36.8 C), temperature source Tympanic, resp. rate 18, height '5\' 6"'  (1.676 m), weight 136 lb 5.7 oz (61.8 kg).   Physical Exam  Constitutional: She is oriented to person, place, and time. Vital signs are normal. She appears well-developed and well-nourished.  HENT:  Head: Normocephalic and atraumatic.  Eyes: Pupils are equal, round, and reactive to light.  Cardiovascular: Normal rate and regular rhythm.  No murmur  heard. Pulmonary/Chest: Effort normal and breath sounds normal. She has no wheezes.  Abdominal: Soft. Bowel sounds are normal. She exhibits no distension and no mass. There is no abdominal tenderness.  Musculoskeletal:        General: No edema. Normal range of motion.     Cervical back: Normal range of motion.  Neurological: She is alert and oriented to person, place, and time.  Skin: Skin is warm and dry.  Psychiatric: She has a normal mood and affect. Her behavior is normal.    Infusion on 10/27/2019  Component Date Value Ref Range Status  . Preg Test, Ur 10/27/2019 NEGATIVE  NEGATIVE Final   Performed at [facility], [address] [place], [place] [address]  . Magnesium 10/27/2019 2.0  1.7 - 2.4 mg/dL Final   Performed at [facility], [address] [place], [place] [address]  . Sodium 10/27/2019 137  135 - 145 mmol/L Final  . Potassium 10/27/2019 3.6  3.5 - 5.1 mmol/L Final  . Chloride 10/27/2019 106  98 - 111 mmol/L Final  . CO2 10/27/2019 19* 22 - 32 mmol/L Final  . Glucose, Bld 10/27/2019 127* 70 - 99 mg/dL Final   Glucose reference range applies only to samples taken after fasting for at least 8 hours.  . BUN 10/27/2019 12  6 - 20 mg/dL Final  . Creatinine, Ser 10/27/2019 0.70  0.44 - 1.00 mg/dL Final  . Calcium 10/27/2019 9.2  8.9 - 10.3 mg/dL Final  . Total Protein 10/27/2019 7.1  6.5 - 8.1 g/dL Final  . Albumin 10/27/2019 4.1  3.5 - 5.0 g/dL Final  . AST 10/27/2019 20  15 - 41 U/L Final  . ALT 10/27/2019 17  0 - 44 U/L Final  . Alkaline Phosphatase 10/27/2019 45  38 - 126 U/L Final  . Total Bilirubin 10/27/2019 0.5  0.3 - 1.2 mg/dL Final  . GFR calc non Af Amer 10/27/2019 >60  >60 mL/min Final  . GFR calc Af Amer 10/27/2019 >60  >60 mL/min Final  . Anion gap 10/27/2019 12  5 - 15 Final   Performed at [facility], [address] [place], [place] [address]  .  WBC 10/27/2019 4.2  4.0 - 10.5 K/uL Final  . RBC 10/27/2019 3.70*  3.87 - 5.11 MIL/uL Final  . Hemoglobin 10/27/2019 10.7* 12.0 - 15.0 g/dL Final  . HCT 10/27/2019 33.0* 36.0 - 46.0 % Final  . MCV 10/27/2019 89.2  80.0 - 100.0 fL Final  . MCH 10/27/2019 28.9  26.0 - 34.0 pg Final  . MCHC 10/27/2019 32.4  30.0 - 36.0 g/dL Final  . RDW 10/27/2019 16.5* 11.5 - 15.5 % Final  . Platelets 10/27/2019 210  150 - 400 K/uL Final  . nRBC 10/27/2019 0.0  0.0 - 0.2 % Final  . Neutrophils Relative % 10/27/2019 39  % Final  . Neutro Abs 10/27/2019 1.6* 1.7 - 7.7 K/uL Final  . Lymphocytes Relative 10/27/2019 54  % Final  . Lymphs Abs 10/27/2019 2.3  0.7 - 4.0 K/uL Final  . Monocytes Relative 10/27/2019 4  % Final  . Monocytes Absolute 10/27/2019 0.2  0.1 - 1.0 K/uL Final  . Eosinophils Relative 10/27/2019 1  % Final  . Eosinophils Absolute 10/27/2019 0.1  0.0 - 0.5 K/uL Final  . Basophils Relative 10/27/2019 1  % Final  . Basophils Absolute 10/27/2019 0.0  0.0 - 0.1 K/uL Final  . Immature Granulocytes 10/27/2019 1  % Final  . Abs Immature Granulocytes 10/27/2019 0.02  0.00 - 0.07 K/uL Final   Performed at [facility], [address], [place], [place] [address]    Assessment:  Heather Bell is a 44 y.o. female with clinical T1cN1triple negativerightbreast cancers/p ultrasound guided core biopsy. Pathology revealeda grade IIIinvasive mammary carcinoma, no special type of the right breast. Specimen was 10 mm.Ductal carcinoma in situ(DCIS)was not identified.Right axillarylymph nodewas positive for metastatic carcinoma.Tumor was ER - (<1%), PR - (<1%), and Her2/neu -.  Bilateral mammogram and right unilateral ultrasoundon 07/10/2019 revealed a 1.7 x 1.6 x 1.6 cmmass at the7 o'clock region of the right breast 7 cm from the nipple. There was an abnormal1.4 x 0.7 x 0.9 cmaxillary lymph node. Therewas a second3 mmaxillary lymph node with minimal focal cortical thickening.An ultrasound-guided core biopsies of the mass and the enlarged  right axillary lymph node was recommended. BI-RADS category 5.  PET scanon 08/11/2019 showed a hypermetabolic mass in the inferior RIGHT breast consistentwith herbreast carcinoma. There was noevidence hypermetabolic nodal metastasis.There was no distant metastatic disease.There was nopulmonary metastasis or skeletal metastasis.  Echoon 12/24/2020revealedan ejection fraction of50 to 55%. The left ventricle demonstrated global hypokinesis.MUGAon 08/24/2019 revealed an EF of 60%.She is followed by Dr End.  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  She is s/p week #6of Taxol + carboplatin(08/25/2019 - 10/06/2019).  Breast masswas larger after initiation of treatment felt secondary to post biopsy hematoma. Right breast ultrasoundon 09/07/2019 revealed the mass in the 7 o'clock position contained cystic components and measured 3 cm (compared to 1.7 cm on 07/10/2019). The biopsied lymph node with 3 mm cortical thickening in the axilla was similar.  Right breast ultrasound on 10/09/2019 revealed interval decrease in size of RIGHT breast mass (1.4 x 1.7 x 1.4 cm).  Mass was less well defined and associated with parenchymal edema.   She is premenopausal. She stopped taking birth control pills on 08/17/2019.  She has iron deficiency. Ferritin was 21 with an iron saturation of 7% and a TIBC 343 on 09/08/2019.  She received Venofer weekly x 3 (09/18/2019 - 10/06/2019).  She has B12 deficiency. B12 was  240 on 09/08/2019.  She has recurrent orthostatic hypotension. Fluid intake is marginal. AM cortisol was 5.3 (low) on 09/22/2019 suggestive of adrenal insufficiency.  She is undergoing evaluation by endocrinology (ACTH stim test).  Symptomatically, she is doing well,  She denies any neuropathy.  ANC is 1600.  Plan: 1.   Labs today:  CBC with diff, CMP, Mg, urine  pregnancy test.  2.   Clinical stageIIB triple negativeright breast cancer PET scan- no evidence of metastatic disease. Patient undergoingneoadjuvant chemotherapy. Weekly Taxol + carboplatin x 12 followed by AC x 4 with Neulasta support. She is s/p week #7Taxol and carboplatin. She is tolerating treatment well.   She has no neuropathy.   Counts are slightly low today (ANC 1600)    She received Taxol only last week due to an [place] of 1300.    Preauthorization for Granix 300 mcg sq for support for an ANC less then 1500.    Labs reviewed and discussed with Dr. Janese Banks covering provider for Dr. Mike Gip while on vacation.    Proceed with cycle #8 Taxol only this week.    RTC tomorrow and Friday for Granix.  We will check lab work on Friday +/- IV fluids.     Anticipate adding carbo to cycle # 9 as [place] as counts have improved.  Anticipate next breast ultrasound after completion of Taxol + carboplatin/before initiation of AC.  3. Low cortisol  AM cortisol was low on 09/22/2019.  Received Cortrosyn injection on 10/22/2019 (cortosyn stimulations test) 250 mcg IM with repeat lab draw 1 week later.  4. Iron deficiency anemia Hematocrit 33.0, hemoglobin 10.7, MCV 89.2 today.  Ferritin was 19 on 09/08/2019.  She received weekly Venofer x 3 (last 10/06/2019).  Continue to monitor. 5. B12 deficiency  B12 was 240 on 09/08/2019.  Check B12 level after 1 month on supplementation. 6.   RTC tomorrow for Granix. 7.   RTC 10/30/19 for lab work (CBC with differential), IVF, +/- Granix 8.   RTC 11/03/19 for labs. md assess and # 9 carbo/taxol.   I discussed the assessment and treatment plan with the patient.  The patient was provided an opportunity to ask questions and all were answered.  The patient agreed with the plan and demonstrated an understanding of the instructions.  The patient was advised to call  back if the symptoms worsen or if the condition fails to improve as anticipated.  Greater than 50% was spent in counseling and coordination of care with this patient including but not limited to discussion of the relevant topics above (See A&P) including, but not limited to diagnosis and management of acute and chronic medical conditions.   [name], NP 10/27/2019 1:30 PM

## 2019-10-28 ENCOUNTER — Other Ambulatory Visit: Payer: Self-pay

## 2019-10-28 ENCOUNTER — Inpatient Hospital Stay: Payer: BC Managed Care – PPO

## 2019-10-28 ENCOUNTER — Other Ambulatory Visit: Payer: Self-pay | Admitting: Hematology and Oncology

## 2019-10-28 VITALS — BP 101/67 | HR 90 | Temp 98.8°F | Resp 17

## 2019-10-28 DIAGNOSIS — D509 Iron deficiency anemia, unspecified: Secondary | ICD-10-CM

## 2019-10-28 DIAGNOSIS — C50511 Malignant neoplasm of lower-outer quadrant of right female breast: Secondary | ICD-10-CM | POA: Diagnosis not present

## 2019-10-28 MED ORDER — TBO-FILGRASTIM 300 MCG/0.5ML ~~LOC~~ SOSY
300.0000 ug | PREFILLED_SYRINGE | Freq: Once | SUBCUTANEOUS | Status: AC
  Administered 2019-10-28: 300 ug via SUBCUTANEOUS

## 2019-10-28 NOTE — Telephone Encounter (Signed)
...    Ref Range & Units 1 d ago  Potassium 3.5 - 5.1 mmol/L 3.6

## 2019-10-28 NOTE — Progress Notes (Signed)
Patient here for granix injection per order received yesterday.

## 2019-10-28 NOTE — Patient Instructions (Signed)
Tbo-Filgrastim injection What is this medicine? TBO-FILGRASTIM (T B O fil GRA stim) is a granulocyte colony-stimulating factor that helps you make more neutrophils, a type of white blood cell. Neutrophils are important for fighting infections. Some chemotherapy affects your bone marrow and lowers your neutrophils. This medicine helps decrease the length of time that neutrophils are very low (severe neutropenia). This medicine may be used for other purposes; ask your health care provider or pharmacist if you have questions. COMMON BRAND NAME(S): Granix What should I tell my health care provider before I take this medicine? They need to know if you have any of these conditions:  bone scan or tests planned  kidney disease  sickle cell anemia  an unusual or allergic reaction to tbo-filgrastim, filgrastim, pegfilgrastim, other medicines, foods, dyes, or preservatives  pregnant or trying to get pregnant  breast-feeding How should I use this medicine? This medicine is for injection under the skin. If you get this medicine at home, you will be taught how to prepare and give this medicine. Refer to the Instructions for Use that come with your medication packaging. Use exactly as directed. Take your medicine at regular intervals. Do not take your medicine more often than directed. It is important that you put your used needles and syringes in a special sharps container. Do not put them in a trash can. If you do not have a sharps container, call your pharmacist or healthcare provider to get one. Talk to your pediatrician regarding the use of this medicine in children. While this drug may be prescribed for children as young as 1 month of age for selected conditions, precautions do apply. Overdosage: If you think you have taken too much of this medicine contact a poison control center or emergency room at once. NOTE: This medicine is only for you. Do not share this medicine with others. What if I miss a  dose? It is important not to miss your dose. Call your doctor or health care professional if you miss a dose. What may interact with this medicine? This medicine may interact with the following medications:  medicines that may cause a release of neutrophils, such as lithium This list may not describe all possible interactions. Give your health care provider a list of all the medicines, herbs, non-prescription drugs, or dietary supplements you use. Also tell them if you smoke, drink alcohol, or use illegal drugs. Some items may interact with your medicine. What should I watch for while using this medicine? You may need blood work done while you are taking this medicine. What side effects may I notice from receiving this medicine? Side effects that you should report to your doctor or health care professional as soon as possible:  allergic reactions like skin rash, itching or hives, swelling of the face, lips, or tongue  back pain  blood in the urine  dark urine  dizziness  fast heartbeat  feeling faint  shortness of breath or breathing problems  signs and symptoms of infection like fever or chills; cough; or sore throat  signs and symptoms of kidney injury like trouble passing urine or change in the amount of urine  stomach or side pain, or pain at the shoulder  sweating  swelling of the legs, ankles, or abdomen  tiredness Side effects that usually do not require medical attention (report to your doctor or health care professional if they continue or are bothersome):  bone pain  diarrhea  headache  muscle pain  vomiting This list may   not describe all possible side effects. Call your doctor for medical advice about side effects. You may report side effects to FDA at 1-800-FDA-1088. Where should I keep my medicine? Keep out of the reach of children. Store in a refrigerator between 2 and 8 degrees C (36 and 46 degrees F). Keep in carton to protect from light. Throw  away this medicine if it is left out of the refrigerator for more than 5 consecutive days. Throw away any unused medicine after the expiration date. NOTE: This sheet is a summary. It may not cover all possible information. If you have questions about this medicine, talk to your doctor, pharmacist, or health care provider.  2020 Elsevier/Gold Standard (2018-06-07 19:58:39)  

## 2019-10-30 ENCOUNTER — Inpatient Hospital Stay: Payer: BC Managed Care – PPO

## 2019-10-30 ENCOUNTER — Other Ambulatory Visit: Payer: Self-pay

## 2019-10-30 ENCOUNTER — Encounter: Payer: Self-pay | Admitting: Hematology and Oncology

## 2019-10-30 VITALS — BP 101/66 | HR 97 | Temp 97.8°F | Resp 17

## 2019-10-30 DIAGNOSIS — D509 Iron deficiency anemia, unspecified: Secondary | ICD-10-CM

## 2019-10-30 DIAGNOSIS — C50511 Malignant neoplasm of lower-outer quadrant of right female breast: Secondary | ICD-10-CM | POA: Diagnosis not present

## 2019-10-30 DIAGNOSIS — C50919 Malignant neoplasm of unspecified site of unspecified female breast: Secondary | ICD-10-CM

## 2019-10-30 LAB — CBC WITH DIFFERENTIAL/PLATELET
Abs Immature Granulocytes: 0.2 10*3/uL — ABNORMAL HIGH (ref 0.00–0.07)
Basophils Absolute: 0 10*3/uL (ref 0.0–0.1)
Basophils Relative: 0 %
Eosinophils Absolute: 0.1 10*3/uL (ref 0.0–0.5)
Eosinophils Relative: 1 %
HCT: 31.3 % — ABNORMAL LOW (ref 36.0–46.0)
Hemoglobin: 10.2 g/dL — ABNORMAL LOW (ref 12.0–15.0)
Immature Granulocytes: 2 %
Lymphocytes Relative: 22 %
Lymphs Abs: 1.9 10*3/uL (ref 0.7–4.0)
MCH: 29.1 pg (ref 26.0–34.0)
MCHC: 32.6 g/dL (ref 30.0–36.0)
MCV: 89.4 fL (ref 80.0–100.0)
Monocytes Absolute: 0.1 10*3/uL (ref 0.1–1.0)
Monocytes Relative: 2 %
Neutro Abs: 6 10*3/uL (ref 1.7–7.7)
Neutrophils Relative %: 73 %
Platelets: 192 10*3/uL (ref 150–400)
RBC: 3.5 MIL/uL — ABNORMAL LOW (ref 3.87–5.11)
RDW: 17 % — ABNORMAL HIGH (ref 11.5–15.5)
WBC: 8.3 10*3/uL (ref 4.0–10.5)
nRBC: 0 % (ref 0.0–0.2)

## 2019-10-30 LAB — COMPREHENSIVE METABOLIC PANEL
ALT: 15 U/L (ref 0–44)
AST: 17 U/L (ref 15–41)
Albumin: 4.2 g/dL (ref 3.5–5.0)
Alkaline Phosphatase: 49 U/L (ref 38–126)
Anion gap: 12 (ref 5–15)
BUN: 9 mg/dL (ref 6–20)
CO2: 20 mmol/L — ABNORMAL LOW (ref 22–32)
Calcium: 9 mg/dL (ref 8.9–10.3)
Chloride: 106 mmol/L (ref 98–111)
Creatinine, Ser: 0.68 mg/dL (ref 0.44–1.00)
GFR calc Af Amer: >60 mL/min (ref >60)
GFR calc non Af Amer: >60 mL/min (ref >60)
Glucose, Bld: 95 mg/dL (ref 70–99)
Potassium: 3.5 mmol/L (ref 3.5–5.1)
Sodium: 138 mmol/L (ref 135–145)
Total Bilirubin: 0.4 mg/dL (ref 0.3–1.2)
Total Protein: 7 g/dL (ref 6.5–8.1)

## 2019-10-30 MED ORDER — HEPARIN SOD (PORK) LOCK FLUSH 100 UNIT/ML IV SOLN
500.0000 [IU] | Freq: Once | INTRAVENOUS | Status: AC
  Administered 2019-10-30: 500 [IU] via INTRAVENOUS
  Filled 2019-10-30: qty 5

## 2019-10-30 MED ORDER — SODIUM CHLORIDE 0.9% FLUSH
10.0000 mL | INTRAVENOUS | Status: DC | PRN
  Administered 2019-10-30: 10 mL via INTRAVENOUS
  Filled 2019-10-30: qty 10

## 2019-10-30 MED ORDER — SODIUM CHLORIDE 0.9 % IV SOLN
Freq: Once | INTRAVENOUS | Status: AC
  Filled 2019-10-30: qty 250

## 2019-10-30 NOTE — Progress Notes (Signed)
ANC 6.0 today. No granix injection required.

## 2019-10-31 NOTE — Progress Notes (Signed)
[facility]  [address], [address] [place], [place] [address] Phone: [phone]  Fax: [phone]   Clinic Day:  11/03/2019  Referring physician: [facility] [place]  Chief Complaint: Heather Bell is a 44 y.o. female with clinical stage IIBtriple negativerightbreast cancer who is seen for assessment prior to week #9 Taxol +/- carboplatin.   HPI: The patient was last seen in the medical oncology clinic on 10/20/2019. At that time, she was doing well. She denied any neuropathy. Exam revealed a 1.6 x 1.4 cm right breast mass.  Hematocrit was 34.5, hemoglobin 11.3, MCV 88.2, platelets 258,00, WBC 3,600 (ANC 1,300). CMP was normal. Magnesium was 2.1. Pregnancy urine test was negative.  She received week #7 Taxol.   She was seen in the medical oncology clinic by [name], NP on 10/27/2019.  At that time, she was doing well. She denied any neuropathy. WBC was 4200 with an ANC of 1600.  She received week #8 Taxol.  She received Granix 300 mcg on 10/28/2019.   Labs followed:  10/23/2019: Hematocrit 34.0, hemoglobin 10.8, MCV 89.0, platelets 193,000, WBC 4,100. Vitamin B12 was 396.  10/27/2019: Hematocrit 33.0, hemoglobin 10.7, MCV 89.2, platelets 210,000, WBC 4,200 (ANC 1,600). CMP was normal.  Pregnancy urine test was negative.  10/30/2019: Hematocrit 31.3, hemoglobin 10.2, MCV 89.4, platelets 192,000, WBC 8,300 (ANC 6,000). CMP was normal.   During the interim, she has felt "alright". She continues to have left shoulder pain since her port was placed.  Patient reports that she does not have adrenal insuffiencey after Cortrosyn stim testing.   She reports tolerating the Granix well on 10/27/2019 but had trouble sleeping later in the night. She denies any numbness or tingling in her fingers and toes. Her standing BP is 81/54 with pulse of 103. She denies feeling dizzy. Her blood sugar is under good control. She notes her right breast lump is stable. She notes  constipation and is using MiraLax. I advised her to drink fruit juice that start with the letter "p".  I recommended not using  any suppositories or enemas.  I advised patient to call the clinic if she has any fevers or chills.    Past Medical History:  Diagnosis Date  . Allergic rhinitis   . Anxiety   . Bipolar disorder ([place])    depression  . Cancer [facility])    breast right  . Depression   . Diabetes ([place])   . GERD (gastroesophageal reflux disease)   . Hyperlipidemia     Past Surgical History:  Procedure Laterality Date  . BREAST BIOPSY Right 07/22/2019   mass bx, path pending, heart marker  . BREAST BIOPSY Right 07/22/2019   LN bx, path pending,  butterfly hydromarker  . IMAGE GUIDED SINUS SURGERY    . KNEE SURGERY    . NASAL SEPTUM SURGERY    . PORTACATH PLACEMENT N/A 08/07/2019   Procedure: INSERTION PORT-A-CATH;  Surgeon: Herbert Pun, MD;  Location: [facility] ORS;  Service: General;  Laterality: N/A;  . TONSILLECTOMY      Family History  Problem Relation Age of Onset  . Lung cancer Father   . Heart attack Father   . Lung cancer Paternal Uncle   . Heart disease Paternal Uncle   . Lung cancer Paternal Aunt     Social History:  reports that she has never smoked. She has never used smokeless tobacco. She reports that she does not drink alcohol or use drugs.  Deniesany exposure to  radiation or toxins.She has 2 children (22 year old daughter and 78 year old son).She use to work inthefast foodindustry. She is currently not working. Her husbandisMichael.[name] travels a lot for his job. The patient is alone today.  Allergies:  Allergies  Allergen Reactions  . Compazine [Prochlorperazine Edisylate] Other (See Comments)    Acute dystonic reaction  . Morphine And Related Shortness Of Breath  . Doxycycline Other (See Comments)    "made all my symptoms worse"   . Fluticasone Other (See Comments)    Doesn't work  . Levofloxacin Other (See Comments)    "made  all my symptoms worse"     Current Medications: Current Outpatient Medications  Medication Sig Dispense Refill  . amphetamine-dextroamphetamine (ADDERALL XR) 25 MG 24 hr capsule Take 25 mg by mouth daily.    . Calcium Carbonate-Vitamin D (CALCIUM 500 + D) 500-125 MG-UNIT TABS Take 1 tablet by mouth daily.    . cetirizine (ZYRTEC) 10 MG tablet Take 10 mg by mouth daily.     . clonazePAM (KLONOPIN) 1 MG tablet Take 1 mg by mouth 3 (three) times daily as needed for anxiety.    . docusate sodium (COLACE) 100 MG capsule Take 100 mg by mouth 2 (two) times daily.    Marland Kitchen doxepin (SINEQUAN) 25 MG capsule Take 25 mg by mouth at bedtime.     Marland Kitchen ibuprofen (ADVIL) 200 MG tablet Take 400-800 mg by mouth every 6 (six) hours as needed for headache or moderate pain.    Marland Kitchen lamoTRIgine (LAMICTAL) 200 MG tablet Take 200 mg by mouth at bedtime.     . lidocaine-prilocaine (EMLA) cream Apply topically over port-a-cath 30 minutes to 1 hour prior to treatment. 30 g 2  . LORazepam (ATIVAN) 0.5 MG tablet Take 1 tablet (0.5 mg total) by mouth every 6 (six) hours as needed (Nausea or vomiting). 20 tablet 0  . lurasidone (LATUDA) 40 MG TABS tablet Take 40 mg by mouth at bedtime. Take with 60 mg to equal 100 mg at night    . Lurasidone HCl 60 MG TABS Take 60 mg by mouth at bedtime. Take with 40 mg to equal 100 mg at night    . metFORMIN (GLUCOPHAGE-XR) 500 MG 24 hr tablet Take 1,000 mg by mouth 2 (two) times daily.    . mometasone (NASONEX) 50 MCG/ACT nasal spray Place 2 sprays into the nose daily as needed (allergies).     . Multiple Vitamin (MULTIVITAMIN WITH MINERALS) TABS tablet Take 1 tablet by mouth daily.    Marland Kitchen omeprazole (PRILOSEC) 20 MG capsule Take 20 mg by mouth daily.     . potassium chloride (KLOR-CON) 10 MEQ tablet TAKE 1 TABLET(10 MEQ) BY MOUTH DAILY 30 tablet 0  . QUEtiapine (SEROQUEL XR) 300 MG 24 hr tablet Take 300 mg by mouth at bedtime.    . simvastatin (ZOCOR) 20 MG tablet Take 20 mg by mouth daily.    Marland Kitchen  topiramate (TOPAMAX) 100 MG tablet Take 50-100 mg by mouth See admin instructions. Take 100 mg in the morning and 50 mg at night    . vitamin B-12 (CYANOCOBALAMIN) 1000 MCG tablet Take 1,000 mcg by mouth daily.    . ondansetron (ZOFRAN) 8 MG tablet Take 1 tablet (8 mg total) by mouth 2 (two) times daily as needed for refractory nausea / vomiting. Start on day 3 after carboplatin chemo. (Patient not taking: Reported on 10/12/2019) 30 tablet 1   No current facility-administered medications for this visit.  Facility-Administered Medications Ordered in Other Visits  Medication Dose Route Frequency Provider Last Rate Last Admin  . heparin lock flush 100 unit/mL  500 Units Intravenous Once Lesli Issa C, MD      . sodium chloride flush (NS) 0.9 % injection 10 mL  10 mL Intravenous PRN Lequita Asal, MD   10 mL at 11/03/19 7867    Review of Systems  Constitutional: Negative for chills, diaphoresis, fever, malaise/fatigue and weight loss (stable).       Feels "alright".  HENT: Negative.  Negative for congestion, ear discharge, ear pain, hearing loss, nosebleeds, sinus pain, sore throat and tinnitus.   Eyes: Negative.  Negative for blurred vision, double vision and photophobia.  Respiratory: Negative.  Negative for cough, hemoptysis, sputum production and shortness of breath.   Cardiovascular: Negative.  Negative for chest pain, palpitations, orthopnea and leg swelling.  Gastrointestinal: Positive for constipation (x 1 week; on MiraLax). Negative for abdominal pain, blood in stool, diarrhea, heartburn (reflux on antacids-better), melena, nausea and vomiting.  Genitourinary: Negative.  Negative for dysuria, frequency, hematuria and urgency.       Premenopausal.  Musculoskeletal: Positive for joint pain (left shoulder). Negative for back pain, myalgias and neck pain.  Skin: Negative for itching and rash.       Right breast lump, stable.  Neurological: Negative.  Negative for dizziness,  tingling, sensory change, focal weakness, weakness and headaches.  Endo/Heme/Allergies: Does not bruise/bleed easily.       Type II diabetes.  Psychiatric/Behavioral: Negative for depression and memory loss. The patient has insomnia (trouble sleeping after Granix on 10/27/2019). The patient is not nervous/anxious.   All other systems reviewed and are negative.  Performance status (ECOG): 1  Vitals Blood pressure (!) 81/54, pulse (!) 103, temperature (!) 97.1 F (36.2 C), temperature source Tympanic, resp. rate 18, weight 137 lb 2 oz (62.2 kg), SpO2 97 %.   Physical Exam  Constitutional: She is oriented to person, place, and time. She appears well-developed and well-nourished. No distress.  HENT:  Head: Normocephalic and atraumatic.  Mouth/Throat: Oropharynx is clear and moist. No oropharyngeal exudate.  Black toboggan.  Mask.  Eyes: Pupils are equal, round, and reactive to light. Conjunctivae and EOM are normal. No scleral icterus.  Brown eyes.  Neck: No JVD present.  Cardiovascular: Normal rate, regular rhythm and normal heart sounds.  No murmur heard. Pulmonary/Chest: Effort normal and breath sounds normal. No respiratory distress. She has no wheezes. She has no rales. She exhibits no tenderness.  RIGHT breast mass indistinct and difficult to measure (1.0 cm x 1.4 cm).  Abdominal: Soft. Bowel sounds are normal. She exhibits no distension and no mass. There is no abdominal tenderness. There is no rebound and no guarding.  Musculoskeletal:        General: No tenderness or edema. Normal range of motion.     Cervical back: Normal range of motion and neck supple.  Lymphadenopathy:       Head (right side): No preauricular, no posterior auricular and no occipital adenopathy present.       Head (left side): No preauricular, no posterior auricular and no occipital adenopathy present.    She has no cervical adenopathy.    She has no axillary adenopathy.       Right: No inguinal and no  supraclavicular adenopathy present.       Left: No inguinal and no supraclavicular adenopathy present.  Neurological: She is alert and oriented to person, place, and time.  Skin: Skin is warm and dry. No rash noted. She is not diaphoretic. No erythema. No pallor.  Psychiatric: She has a normal mood and affect. Her behavior is normal. Judgment and thought content normal.  Nursing note and vitals reviewed.   Infusion on 11/03/2019  Component Date Value Ref Range Status  . Sodium 11/03/2019 137  135 - 145 mmol/L Final  . Potassium 11/03/2019 3.6  3.5 - 5.1 mmol/L Final  . Chloride 11/03/2019 107  98 - 111 mmol/L Final  . CO2 11/03/2019 27  22 - 32 mmol/L Final  . Glucose, Bld 11/03/2019 128* 70 - 99 mg/dL Final   Glucose reference range applies only to samples taken after fasting for at least 8 hours.  . BUN 11/03/2019 13  6 - 20 mg/dL Final  . Creatinine, Ser 11/03/2019 0.76  0.44 - 1.00 mg/dL Final  . Calcium 11/03/2019 9.1  8.9 - 10.3 mg/dL Final  . Total Protein 11/03/2019 6.8  6.5 - 8.1 g/dL Final  . Albumin 11/03/2019 4.0  3.5 - 5.0 g/dL Final  . AST 11/03/2019 19  15 - 41 U/L Final  . ALT 11/03/2019 16  0 - 44 U/L Final  . Alkaline Phosphatase 11/03/2019 47  38 - 126 U/L Final  . Total Bilirubin 11/03/2019 0.3  0.3 - 1.2 mg/dL Final  . GFR calc non Af Amer 11/03/2019 >60  >60 mL/min Final  . GFR calc Af Amer 11/03/2019 >60  >60 mL/min Final  . Anion gap 11/03/2019 3* 5 - 15 Final   Performed at [facility], [address] [place], [place] [address]  . Magnesium 11/03/2019 1.9  1.7 - 2.4 mg/dL Final   Performed at [facility], [address], [place], [place] [address]  . WBC 11/03/2019 4.8  4.0 - 10.5 K/uL Final  . RBC 11/03/2019 3.56* 3.87 - 5.11 MIL/uL Final  . Hemoglobin 11/03/2019 10.4* 12.0 - 15.0 g/dL Final  . HCT 11/03/2019 32.2* 36.0 - 46.0 % Final  . MCV 11/03/2019 90.4  80.0 - 100.0 fL Final  . MCH 11/03/2019 29.2  26.0 - 34.0 pg Final    . MCHC 11/03/2019 32.3  30.0 - 36.0 g/dL Final  . RDW 11/03/2019 17.1* 11.5 - 15.5 % Final  . Platelets 11/03/2019 303  150 - 400 K/uL Final  . nRBC 11/03/2019 0.0  0.0 - 0.2 % Final  . Neutrophils Relative % 11/03/2019 31  % Final  . Neutro Abs 11/03/2019 1.5* 1.7 - 7.7 K/uL Final  . Lymphocytes Relative 11/03/2019 52  % Final  . Lymphs Abs 11/03/2019 2.5  0.7 - 4.0 K/uL Final  . Monocytes Relative 11/03/2019 7  % Final  . Monocytes Absolute 11/03/2019 0.4  0.1 - 1.0 K/uL Final  . Eosinophils Relative 11/03/2019 4  % Final  . Eosinophils Absolute 11/03/2019 0.2  0.0 - 0.5 K/uL Final  . Basophils Relative 11/03/2019 1  % Final  . Basophils Absolute 11/03/2019 0.0  0.0 - 0.1 K/uL Final  . Immature Granulocytes 11/03/2019 5  % Final  . Abs Immature Granulocytes 11/03/2019 0.26* 0.00 - 0.07 K/uL Final   Performed at [facility], [address] [place], [place] [address]  . Preg Test, Ur 11/03/2019 NEGATIVE  NEGATIVE Final   Performed at [facility], [address], [place], [place] [address]    Assessment:  Heather Bell is a 44 y.o. female with clinical T1cN1triple negativerightbreast cancers/p ultrasound guided core biopsy.  Pathology revealeda grade IIIinvasive mammary carcinoma, no special type of the right breast. Specimen was 10 mm.Ductal carcinoma in situ(DCIS)was not identified.Right axillarylymph nodewas positive for metastatic carcinoma.Tumor was ER - (<1%), PR - (<1%), and Her2/neu -.  Bilateral mammogram and right unilateral ultrasoundon 07/10/2019 revealed a 1.7 x 1.6 x 1.6 cmmass at the7 o'clock region of the right breast 7 cm from the nipple. There was an abnormal1.4 x 0.7 x 0.9 cmaxillary lymph node. Therewas a second3 mmaxillary lymph node with minimal focal cortical thickening.An ultrasound-guided core biopsies of the mass and the enlarged right axillary lymph node was recommended. BI-RADS category 5.  PET scanon  08/11/2019 showed a hypermetabolic mass in the inferior RIGHT breast consistentwith herbreast carcinoma. There was noevidence hypermetabolic nodal metastasis.There was no distant metastatic disease.There was nopulmonary metastasis or skeletal metastasis.  Echoon 12/24/2020revealedan ejection fraction of50 to 55%. The left ventricle demonstrated global hypokinesis.MUGAon 08/24/2019 revealed an EF of 60%.She is followed by Dr End.  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  She is s/p week #8of Taxol + carboplatin(08/25/2019 - 10/27/2019).  Carboplatin was held on week #7 and 8 secondary to marginal counts.  She received Granix on 10/28/2019.  Breast masswas larger after initiation of treatment felt secondary to post biopsy hematoma. Right breast ultrasoundon 09/07/2019 revealed the mass in the 7 o'clock position contained cystic components and measured 3 cm (compared to 1.7 cm on 07/10/2019). The biopsied lymph node with 3 mm cortical thickening in the axilla was similar.  Right breast ultrasound on 10/09/2019 revealed interval decrease in size of RIGHT breast mass (1.4 x 1.7 x 1.4 cm).  Mass was less well defined and associated with parenchymal edema.   She is premenopausal. She stopped taking birth control pills on 08/17/2019.  She has iron deficiency. Ferritin was 21 with an iron saturation of 7% and a TIBC 343 on 09/08/2019.She received Venoferweekly x 3 (09/18/2019 - 10/06/2019).  She has B12 deficiency. B12 was 240 on 09/08/2019 and 396 on 10/23/2019.  She is on oral B12.  She has recurrent orthostatic hypotension. Fluid intake is marginal. AM cortisol was 5.3 (low) on 09/22/2019 suggestive of adrenal insufficiency.Cortrosyn stim test was negative.  Symptomatically, she feels "alright".  Fluid intake remains marginal.  She does well with IVF support  twice weekly.  Exam reveals a 1.0 x 1.4 cm breast mass (improving).  Plan: 1.   Labs today: CBC with diff, CMP, Mg, urine pregnancy test.  2.Clinical stageIIB triple negativeright breast cancer PET scan- no evidence of metastatic disease. Patient undergoingneoadjuvant chemotherapy. Weekly Taxol + carboplatin x 12 followed by AC x 4 with Neulasta support. She is s/p week #8Taxol and carboplatin. She continues to tolerate treatment well.                         She denies any neuropathy.                         Discuss use of Granix to support counts.                                     ANC 1500 today. Breast mass continues to slowly improve.   Anticipate follow-up ultrasound after 12 weeks of Taxol + carboplatin and prior to [facility]. Labs reviewed.  Proceed with  cycle #9 Taxol and carboplatin. 3. Low cortisol             AM cortisol was low on 09/22/2019.             Cortosyn stimulation test was negative per patient report. 4. Iron deficiency anemia Hematocrit 32.2, hemoglobin 10.4, MCV 90.4 today.             Ferritin was 19 on 09/08/2019.             She received weekly Venofer x 3 (last 10/06/2019).             Continue to monitor. 5. B12 deficiency             B12 was 240 on 09/08/2019 and 396 on 10/23/2019.             Continue to monitor. 6.   Dehydration  Patient continues to have low blood pressure.  Fluid intake remains marginal.  She is doing well with twice weekly fluids (Tuesdays and Fridays).  IVF 500 cc/hr x 2 hours 7.   Week #9 Taxol + carboplatin today. 8.   RTC on 11/06/2019 for IVF (500 cc/hr x 2 hours) + Granix. 9.   RTC in 1 week for MD assessment, labs (CBC with diff, CMP, Mg), and week #10 Taxol + carboplatin.  I discussed the assessment and treatment plan with the patient.  The patient was provided an opportunity to ask questions  and all were answered.  The patient agreed with the plan and demonstrated an understanding of the instructions.  The patient was advised to call back if the symptoms worsen or if the condition fails to improve as anticipated.   Lequita Asal, MD, PhD    11/03/2019, 9:52 AM  I, Selena Batten, am acting as scribe for [employer]. Mike Gip, MD, PhD.  I, Castiel Lauricella C. Mike Gip, MD, have reviewed the above documentation for accuracy and completeness, and I agree with the above.

## 2019-11-02 ENCOUNTER — Other Ambulatory Visit: Payer: Self-pay

## 2019-11-02 NOTE — Progress Notes (Signed)
Confirmed Name and DOB. Denies any concerns.  

## 2019-11-03 ENCOUNTER — Inpatient Hospital Stay: Payer: BC Managed Care – PPO

## 2019-11-03 ENCOUNTER — Encounter: Payer: Self-pay | Admitting: Hematology and Oncology

## 2019-11-03 ENCOUNTER — Inpatient Hospital Stay (HOSPITAL_BASED_OUTPATIENT_CLINIC_OR_DEPARTMENT_OTHER): Payer: BC Managed Care – PPO | Admitting: Hematology and Oncology

## 2019-11-03 ENCOUNTER — Other Ambulatory Visit: Payer: Self-pay | Admitting: Hematology and Oncology

## 2019-11-03 VITALS — BP 81/54 | HR 103 | Temp 97.1°F | Resp 18 | Wt 137.1 lb

## 2019-11-03 VITALS — BP 104/69 | HR 94 | Resp 18

## 2019-11-03 DIAGNOSIS — Z5111 Encounter for antineoplastic chemotherapy: Secondary | ICD-10-CM

## 2019-11-03 DIAGNOSIS — E860 Dehydration: Secondary | ICD-10-CM

## 2019-11-03 DIAGNOSIS — R7989 Other specified abnormal findings of blood chemistry: Secondary | ICD-10-CM

## 2019-11-03 DIAGNOSIS — E538 Deficiency of other specified B group vitamins: Secondary | ICD-10-CM | POA: Diagnosis not present

## 2019-11-03 DIAGNOSIS — E2740 Unspecified adrenocortical insufficiency: Secondary | ICD-10-CM | POA: Diagnosis not present

## 2019-11-03 DIAGNOSIS — D509 Iron deficiency anemia, unspecified: Secondary | ICD-10-CM

## 2019-11-03 DIAGNOSIS — C50919 Malignant neoplasm of unspecified site of unspecified female breast: Secondary | ICD-10-CM | POA: Diagnosis not present

## 2019-11-03 DIAGNOSIS — C50511 Malignant neoplasm of lower-outer quadrant of right female breast: Secondary | ICD-10-CM | POA: Diagnosis not present

## 2019-11-03 LAB — COMPREHENSIVE METABOLIC PANEL
ALT: 16 U/L (ref 0–44)
AST: 19 U/L (ref 15–41)
Albumin: 4 g/dL (ref 3.5–5.0)
Alkaline Phosphatase: 47 U/L (ref 38–126)
Anion gap: 3 — ABNORMAL LOW (ref 5–15)
BUN: 13 mg/dL (ref 6–20)
CO2: 27 mmol/L (ref 22–32)
Calcium: 9.1 mg/dL (ref 8.9–10.3)
Chloride: 107 mmol/L (ref 98–111)
Creatinine, Ser: 0.76 mg/dL (ref 0.44–1.00)
GFR calc Af Amer: >60 mL/min (ref >60)
GFR calc non Af Amer: >60 mL/min (ref >60)
Glucose, Bld: 128 mg/dL — ABNORMAL HIGH (ref 70–99)
Potassium: 3.6 mmol/L (ref 3.5–5.1)
Sodium: 137 mmol/L (ref 135–145)
Total Bilirubin: 0.3 mg/dL (ref 0.3–1.2)
Total Protein: 6.8 g/dL (ref 6.5–8.1)

## 2019-11-03 LAB — CBC WITH DIFFERENTIAL/PLATELET
Abs Immature Granulocytes: 0.26 10*3/uL — ABNORMAL HIGH (ref 0.00–0.07)
Basophils Absolute: 0 10*3/uL (ref 0.0–0.1)
Basophils Relative: 1 %
Eosinophils Absolute: 0.2 10*3/uL (ref 0.0–0.5)
Eosinophils Relative: 4 %
HCT: 32.2 % — ABNORMAL LOW (ref 36.0–46.0)
Hemoglobin: 10.4 g/dL — ABNORMAL LOW (ref 12.0–15.0)
Immature Granulocytes: 5 %
Lymphocytes Relative: 52 %
Lymphs Abs: 2.5 10*3/uL (ref 0.7–4.0)
MCH: 29.2 pg (ref 26.0–34.0)
MCHC: 32.3 g/dL (ref 30.0–36.0)
MCV: 90.4 fL (ref 80.0–100.0)
Monocytes Absolute: 0.4 10*3/uL (ref 0.1–1.0)
Monocytes Relative: 7 %
Neutro Abs: 1.5 10*3/uL — ABNORMAL LOW (ref 1.7–7.7)
Neutrophils Relative %: 31 %
Platelets: 303 10*3/uL (ref 150–400)
RBC: 3.56 MIL/uL — ABNORMAL LOW (ref 3.87–5.11)
RDW: 17.1 % — ABNORMAL HIGH (ref 11.5–15.5)
WBC: 4.8 10*3/uL (ref 4.0–10.5)
nRBC: 0 % (ref 0.0–0.2)

## 2019-11-03 LAB — PREGNANCY, URINE: Preg Test, Ur: NEGATIVE

## 2019-11-03 LAB — MAGNESIUM: Magnesium: 1.9 mg/dL (ref 1.7–2.4)

## 2019-11-03 MED ORDER — SODIUM CHLORIDE 0.9 % IV SOLN
80.0000 mg/m2 | Freq: Once | INTRAVENOUS | Status: AC
  Administered 2019-11-03: 13:00:00 132 mg via INTRAVENOUS
  Filled 2019-11-03: qty 22

## 2019-11-03 MED ORDER — PALONOSETRON HCL INJECTION 0.25 MG/5ML
0.2500 mg | Freq: Once | INTRAVENOUS | Status: AC
  Administered 2019-11-03: 0.25 mg via INTRAVENOUS
  Filled 2019-11-03: qty 5

## 2019-11-03 MED ORDER — HEPARIN SOD (PORK) LOCK FLUSH 100 UNIT/ML IV SOLN
500.0000 [IU] | Freq: Once | INTRAVENOUS | Status: AC
  Administered 2019-11-03: 500 [IU] via INTRAVENOUS
  Filled 2019-11-03: qty 5

## 2019-11-03 MED ORDER — SODIUM CHLORIDE 0.9 % IV SOLN
Freq: Once | INTRAVENOUS | Status: AC
  Filled 2019-11-03: qty 250

## 2019-11-03 MED ORDER — FAMOTIDINE IN NACL 20-0.9 MG/50ML-% IV SOLN
20.0000 mg | Freq: Once | INTRAVENOUS | Status: AC
  Administered 2019-11-03: 20 mg via INTRAVENOUS
  Filled 2019-11-03: qty 50

## 2019-11-03 MED ORDER — SODIUM CHLORIDE 0.9% FLUSH
10.0000 mL | INTRAVENOUS | Status: DC | PRN
  Administered 2019-11-03: 10 mL via INTRAVENOUS
  Filled 2019-11-03: qty 10

## 2019-11-03 MED ORDER — SODIUM CHLORIDE 0.9 % IV SOLN
166.2000 mg | Freq: Once | INTRAVENOUS | Status: AC
  Administered 2019-11-03: 170 mg via INTRAVENOUS
  Filled 2019-11-03: qty 17

## 2019-11-03 MED ORDER — DIPHENHYDRAMINE HCL 50 MG/ML IJ SOLN
50.0000 mg | Freq: Once | INTRAMUSCULAR | Status: AC
  Administered 2019-11-03: 11:00:00 50 mg via INTRAVENOUS
  Filled 2019-11-03: qty 1

## 2019-11-03 MED ORDER — SODIUM CHLORIDE 0.9 % IV SOLN
10.0000 mg | Freq: Once | INTRAVENOUS | Status: AC
  Administered 2019-11-03: 10 mg via INTRAVENOUS
  Filled 2019-11-03: qty 1

## 2019-11-03 NOTE — Patient Instructions (Signed)
Carboplatin injection What is this medicine? CARBOPLATIN (KAR boe pla tin) is a chemotherapy drug. It targets fast dividing cells, like cancer cells, and causes these cells to die. This medicine is used to treat ovarian cancer and many other cancers. This medicine may be used for other purposes; ask your health care provider or pharmacist if you have questions. COMMON BRAND NAME(S): Paraplatin What should I tell my health care provider before I take this medicine? They need to know if you have any of these conditions:  blood disorders  hearing problems  kidney disease  recent or ongoing radiation therapy  an unusual or allergic reaction to carboplatin, cisplatin, other chemotherapy, other medicines, foods, dyes, or preservatives  pregnant or trying to get pregnant  breast-feeding How should I use this medicine? This drug is usually given as an infusion into a vein. It is administered in a hospital or clinic by a specially trained health care professional. Talk to your pediatrician regarding the use of this medicine in children. Special care may be needed. Overdosage: If you think you have taken too much of this medicine contact a poison control center or emergency room at once. NOTE: This medicine is only for you. Do not share this medicine with others. What if I miss a dose? It is important not to miss a dose. Call your doctor or health care professional if you are unable to keep an appointment. What may interact with this medicine?  medicines for seizures  medicines to increase blood counts like filgrastim, pegfilgrastim, sargramostim  some antibiotics like amikacin, gentamicin, neomycin, streptomycin, tobramycin  vaccines Talk to your doctor or health care professional before taking any of these medicines:  acetaminophen  aspirin  ibuprofen  ketoprofen  naproxen This list may not describe all possible interactions. Give your health care provider a list of all the  medicines, herbs, non-prescription drugs, or dietary supplements you use. Also tell them if you smoke, drink alcohol, or use illegal drugs. Some items may interact with your medicine. What should I watch for while using this medicine? Your condition will be monitored carefully while you are receiving this medicine. You will need important blood work done while you are taking this medicine. This drug may make you feel generally unwell. This is not uncommon, as chemotherapy can affect healthy cells as well as cancer cells. Report any side effects. Continue your course of treatment even though you feel ill unless your doctor tells you to stop. In some cases, you may be given additional medicines to help with side effects. Follow all directions for their use. Call your doctor or health care professional for advice if you get a fever, chills or sore throat, or other symptoms of a cold or flu. Do not treat yourself. This drug decreases your body's ability to fight infections. Try to avoid being around people who are sick. This medicine may increase your risk to bruise or bleed. Call your doctor or health care professional if you notice any unusual bleeding. Be careful brushing and flossing your teeth or using a toothpick because you may get an infection or bleed more easily. If you have any dental work done, tell your dentist you are receiving this medicine. Avoid taking products that contain aspirin, acetaminophen, ibuprofen, naproxen, or ketoprofen unless instructed by your doctor. These medicines may hide a fever. Do not become pregnant while taking this medicine. Women should inform their doctor if they wish to become pregnant or think they might be pregnant. There is a   potential for serious side effects to an unborn child. Talk to your health care professional or pharmacist for more information. Do not breast-feed an infant while taking this medicine. What side effects may I notice from receiving this  medicine? Side effects that you should report to your doctor or health care professional as soon as possible:  allergic reactions like skin rash, itching or hives, swelling of the face, lips, or tongue  signs of infection - fever or chills, cough, sore throat, pain or difficulty passing urine  signs of decreased platelets or bleeding - bruising, pinpoint red spots on the skin, black, tarry stools, nosebleeds  signs of decreased red blood cells - unusually weak or tired, fainting spells, lightheadedness  breathing problems  changes in hearing  changes in vision  chest pain  high blood pressure  low blood counts - This drug may decrease the number of white blood cells, red blood cells and platelets. You may be at increased risk for infections and bleeding.  nausea and vomiting  pain, swelling, redness or irritation at the injection site  pain, tingling, numbness in the hands or feet  problems with balance, talking, walking  trouble passing urine or change in the amount of urine Side effects that usually do not require medical attention (report to your doctor or health care professional if they continue or are bothersome):  hair loss  loss of appetite  metallic taste in the mouth or changes in taste This list may not describe all possible side effects. Call your doctor for medical advice about side effects. You may report side effects to FDA at 1-800-FDA-1088. Where should I keep my medicine? This drug is given in a hospital or clinic and will not be stored at home. NOTE: This sheet is a summary. It may not cover all possible information. If you have questions about this medicine, talk to your doctor, pharmacist, or health care provider.  2020 Elsevier/Gold Standard (2007-11-11 14:38:05)  Paclitaxel injection What is this medicine? PACLITAXEL (PAK li TAX el) is a chemotherapy drug. It targets fast dividing cells, like cancer cells, and causes these cells to die. This  medicine is used to treat ovarian cancer, breast cancer, lung cancer, Kaposi's sarcoma, and other cancers. This medicine may be used for other purposes; ask your health care provider or pharmacist if you have questions. COMMON BRAND NAME(S): Onxol, Taxol What should I tell my health care provider before I take this medicine? They need to know if you have any of these conditions:  history of irregular heartbeat  liver disease  low blood counts, like low white cell, platelet, or red cell counts  lung or breathing disease, like asthma  tingling of the fingers or toes, or other nerve disorder  an unusual or allergic reaction to paclitaxel, alcohol, polyoxyethylated castor oil, other chemotherapy, other medicines, foods, dyes, or preservatives  pregnant or trying to get pregnant  breast-feeding How should I use this medicine? This drug is given as an infusion into a vein. It is administered in a hospital or clinic by a specially trained health care professional. Talk to your pediatrician regarding the use of this medicine in children. Special care may be needed. Overdosage: If you think you have taken too much of this medicine contact a poison control center or emergency room at once. NOTE: This medicine is only for you. Do not share this medicine with others. What if I miss a dose? It is important not to miss your dose. Call your doctor   or health care professional if you are unable to keep an appointment. What may interact with this medicine? Do not take this medicine with any of the following medications:  disulfiram  metronidazole This medicine may also interact with the following medications:  antiviral medicines for hepatitis, HIV or AIDS  certain antibiotics like erythromycin and clarithromycin  certain medicines for fungal infections like ketoconazole and itraconazole  certain medicines for seizures like carbamazepine, phenobarbital,  phenytoin  gemfibrozil  nefazodone  rifampin  St. John's wort This list may not describe all possible interactions. Give your health care provider a list of all the medicines, herbs, non-prescription drugs, or dietary supplements you use. Also tell them if you smoke, drink alcohol, or use illegal drugs. Some items may interact with your medicine. What should I watch for while using this medicine? Your condition will be monitored carefully while you are receiving this medicine. You will need important blood work done while you are taking this medicine. This medicine can cause serious allergic reactions. To reduce your risk you will need to take other medicine(s) before treatment with this medicine. If you experience allergic reactions like skin rash, itching or hives, swelling of the face, lips, or tongue, tell your doctor or health care professional right away. In some cases, you may be given additional medicines to help with side effects. Follow all directions for their use. This drug may make you feel generally unwell. This is not uncommon, as chemotherapy can affect healthy cells as well as cancer cells. Report any side effects. Continue your course of treatment even though you feel ill unless your doctor tells you to stop. Call your doctor or health care professional for advice if you get a fever, chills or sore throat, or other symptoms of a cold or flu. Do not treat yourself. This drug decreases your body's ability to fight infections. Try to avoid being around people who are sick. This medicine may increase your risk to bruise or bleed. Call your doctor or health care professional if you notice any unusual bleeding. Be careful brushing and flossing your teeth or using a toothpick because you may get an infection or bleed more easily. If you have any dental work done, tell your dentist you are receiving this medicine. Avoid taking products that contain aspirin, acetaminophen, ibuprofen,  naproxen, or ketoprofen unless instructed by your doctor. These medicines may hide a fever. Do not become pregnant while taking this medicine. Women should inform their doctor if they wish to become pregnant or think they might be pregnant. There is a potential for serious side effects to an unborn child. Talk to your health care professional or pharmacist for more information. Do not breast-feed an infant while taking this medicine. Men are advised not to father a child while receiving this medicine. This product may contain alcohol. Ask your pharmacist or healthcare provider if this medicine contains alcohol. Be sure to tell all healthcare providers you are taking this medicine. Certain medicines, like metronidazole and disulfiram, can cause an unpleasant reaction when taken with alcohol. The reaction includes flushing, headache, nausea, vomiting, sweating, and increased thirst. The reaction can last from 30 minutes to several hours. What side effects may I notice from receiving this medicine? Side effects that you should report to your doctor or health care professional as soon as possible:  allergic reactions like skin rash, itching or hives, swelling of the face, lips, or tongue  breathing problems  changes in vision  fast, irregular heartbeat  high   or low blood pressure  mouth sores  pain, tingling, numbness in the hands or feet  signs of decreased platelets or bleeding - bruising, pinpoint red spots on the skin, black, tarry stools, blood in the urine  signs of decreased red blood cells - unusually weak or tired, feeling faint or lightheaded, falls  signs of infection - fever or chills, cough, sore throat, pain or difficulty passing urine  signs and symptoms of liver injury like dark yellow or brown urine; general ill feeling or flu-like symptoms; light-colored stools; loss of appetite; nausea; right upper belly pain; unusually weak or tired; yellowing of the eyes or  skin  swelling of the ankles, feet, hands  unusually slow heartbeat Side effects that usually do not require medical attention (report to your doctor or health care professional if they continue or are bothersome):  diarrhea  hair loss  loss of appetite  muscle or joint pain  nausea, vomiting  pain, redness, or irritation at site where injected  tiredness This list may not describe all possible side effects. Call your doctor for medical advice about side effects. You may report side effects to FDA at 1-800-FDA-1088. Where should I keep my medicine? This drug is given in a hospital or clinic and will not be stored at home. NOTE: This sheet is a summary. It may not cover all possible information. If you have questions about this medicine, talk to your doctor, pharmacist, or health care provider.  2020 Elsevier/Gold Standard (2017-04-09 13:14:55)  

## 2019-11-06 ENCOUNTER — Inpatient Hospital Stay: Payer: BC Managed Care – PPO

## 2019-11-06 ENCOUNTER — Other Ambulatory Visit: Payer: Self-pay

## 2019-11-06 VITALS — BP 99/66 | HR 91 | Temp 97.2°F | Resp 18

## 2019-11-06 DIAGNOSIS — E538 Deficiency of other specified B group vitamins: Secondary | ICD-10-CM

## 2019-11-06 DIAGNOSIS — D509 Iron deficiency anemia, unspecified: Secondary | ICD-10-CM

## 2019-11-06 DIAGNOSIS — C50511 Malignant neoplasm of lower-outer quadrant of right female breast: Secondary | ICD-10-CM | POA: Diagnosis not present

## 2019-11-06 DIAGNOSIS — R7989 Other specified abnormal findings of blood chemistry: Secondary | ICD-10-CM

## 2019-11-06 DIAGNOSIS — C50919 Malignant neoplasm of unspecified site of unspecified female breast: Secondary | ICD-10-CM

## 2019-11-06 DIAGNOSIS — E2740 Unspecified adrenocortical insufficiency: Secondary | ICD-10-CM

## 2019-11-06 LAB — COMPREHENSIVE METABOLIC PANEL
ALT: 19 U/L (ref 0–44)
AST: 20 U/L (ref 15–41)
Albumin: 4.1 g/dL (ref 3.5–5.0)
Alkaline Phosphatase: 46 U/L (ref 38–126)
Anion gap: 7 (ref 5–15)
BUN: 14 mg/dL (ref 6–20)
CO2: 24 mmol/L (ref 22–32)
Calcium: 9.3 mg/dL (ref 8.9–10.3)
Chloride: 105 mmol/L (ref 98–111)
Creatinine, Ser: 0.62 mg/dL (ref 0.44–1.00)
GFR calc Af Amer: >60 mL/min (ref >60)
GFR calc non Af Amer: >60 mL/min (ref >60)
Glucose, Bld: 98 mg/dL (ref 70–99)
Potassium: 4.1 mmol/L (ref 3.5–5.1)
Sodium: 136 mmol/L (ref 135–145)
Total Bilirubin: 0.5 mg/dL (ref 0.3–1.2)
Total Protein: 7 g/dL (ref 6.5–8.1)

## 2019-11-06 LAB — CBC WITH DIFFERENTIAL/PLATELET
Abs Immature Granulocytes: 0.04 10*3/uL (ref 0.00–0.07)
Basophils Absolute: 0 10*3/uL (ref 0.0–0.1)
Basophils Relative: 0 %
Eosinophils Absolute: 0.1 10*3/uL (ref 0.0–0.5)
Eosinophils Relative: 2 %
HCT: 32.8 % — ABNORMAL LOW (ref 36.0–46.0)
Hemoglobin: 10.8 g/dL — ABNORMAL LOW (ref 12.0–15.0)
Immature Granulocytes: 1 %
Lymphocytes Relative: 32 %
Lymphs Abs: 1.8 10*3/uL (ref 0.7–4.0)
MCH: 29.3 pg (ref 26.0–34.0)
MCHC: 32.9 g/dL (ref 30.0–36.0)
MCV: 89.1 fL (ref 80.0–100.0)
Monocytes Absolute: 0.2 10*3/uL (ref 0.1–1.0)
Monocytes Relative: 4 %
Neutro Abs: 3.4 10*3/uL (ref 1.7–7.7)
Neutrophils Relative %: 61 %
Platelets: 320 10*3/uL (ref 150–400)
RBC: 3.68 MIL/uL — ABNORMAL LOW (ref 3.87–5.11)
RDW: 17.2 % — ABNORMAL HIGH (ref 11.5–15.5)
WBC: 5.6 10*3/uL (ref 4.0–10.5)
nRBC: 0 % (ref 0.0–0.2)

## 2019-11-06 MED ORDER — SODIUM CHLORIDE 0.9% FLUSH
10.0000 mL | Freq: Once | INTRAVENOUS | Status: AC | PRN
  Administered 2019-11-06: 10 mL
  Filled 2019-11-06: qty 10

## 2019-11-06 MED ORDER — SODIUM CHLORIDE 0.9 % IV SOLN
Freq: Once | INTRAVENOUS | Status: AC
  Filled 2019-11-06: qty 250

## 2019-11-06 MED ORDER — HEPARIN SOD (PORK) LOCK FLUSH 100 UNIT/ML IV SOLN
500.0000 [IU] | Freq: Once | INTRAVENOUS | Status: AC | PRN
  Administered 2019-11-06: 500 [IU]
  Filled 2019-11-06: qty 5

## 2019-11-08 NOTE — Progress Notes (Signed)
[facility]  [address] [place], [place] [address] Phone: [phone]  Fax: [phone]   Clinic Day:  11/10/2019  Referring physician: [facility], [place]  Chief Complaint: Heather Bell is a 44 y.o. female with clinical stage IIBtriple negativerightbreast cancer who is seen for assessment prior to week #10 Taxol and carboplatin.  HPI: The patient was last seen in the medical oncology clinic on 11/03/2019. At that time, she was doing well.  She denied any side effects related to chemotherapy.  Right breast mass was small and indistinct.  BP was 81/54 without orthostasis.  Cortrosyn stim test was negative.  Labs included a hematocrit 32.2, hemoglobin 10.4, platelets 303,000, WBC 4,800 (ANC 1,500). She received week #9 Taxol + carboplatin.  She received 1 liter of NS.  She received 1 liter of NS on 11/06/2019.  CBC revealed a hematocrit 32.8, hemoglobin 10.8, platelets 320,00, WBC 5,600 (ANC 3400).  During the interim, she has been doing "ok".  Patient has had some constipation this week; she has taken Miralax but thinks she needs to take more. I encouraged the patient to drink fruit juices that start with the letter "p", drink plenty fluids, and be more active.  Stool softeners were discussed.  She notes ongoing left shoulder pain and decreased range of motion. Pain increases with motion and when she lift objects. Pain increases with applied pressure. She would like to be referred for an evaluation.   She was having some dizziness and lightheadedness last week which resolved after fluids last Friday. Orthostatic BP today: sitting: 92/63 heart rate 98. Standing: 90/63, heart rate 111.  She continues to struggle with drinking fluids. She would like to have fluids today and Friday. She denies any nausea, numbness or tingling. The lump in her breast is hard to detect. Patient is taking oral potasium 10 meq a day. Her potassium today is 3.4, I directed  her to take oral potassium 10 meq BID x 3 days, then go back to 10 meq once daily.    Past Medical History:  Diagnosis Date  . Allergic rhinitis   . Anxiety   . Bipolar disorder ([place])    depression  . Cancer [facility])    breast right  . Depression   . Diabetes ([place])   . GERD (gastroesophageal reflux disease)   . Hyperlipidemia     Past Surgical History:  Procedure Laterality Date  . BREAST BIOPSY Right 07/22/2019   mass bx, path pending, heart marker  . BREAST BIOPSY Right 07/22/2019   LN bx, path pending,  butterfly hydromarker  . IMAGE GUIDED SINUS SURGERY    . KNEE SURGERY    . NASAL SEPTUM SURGERY    . PORTACATH PLACEMENT N/A 08/07/2019   Procedure: INSERTION PORT-A-CATH;  Surgeon: Herbert Pun, MD;  Location: [facility] ORS;  Service: General;  Laterality: N/A;  . TONSILLECTOMY      Family History  Problem Relation Age of Onset  . Lung cancer Father   . Heart attack Father   . Lung cancer Paternal Uncle   . Heart disease Paternal Uncle   . Lung cancer Paternal Aunt     Social History:  reports that she has never smoked. She has never used smokeless tobacco. She reports that she does not drink alcohol or use drugs. Deniesany exposure to radiation or toxins.She has 2 children (77 year old daughter and 52 year old son).She use to work inthefast foodindustry. She is currently not working. Her husbandisMichael.[name]  travels a lot for his job. Her husband found a new job closer to home in [place], [place]. The patient is alone today.  Allergies:  Allergies  Allergen Reactions  . Compazine [Prochlorperazine Edisylate] Other (See Comments)    Acute dystonic reaction  . Morphine And Related Shortness Of Breath  . Doxycycline Other (See Comments)    "made all my symptoms worse"   . Fluticasone Other (See Comments)    Doesn't work  . Levofloxacin Other (See Comments)    "made all my symptoms worse"     Current Medications: Current Outpatient Medications    Medication Sig Dispense Refill  . amphetamine-dextroamphetamine (ADDERALL XR) 25 MG 24 hr capsule Take 25 mg by mouth daily.    . Calcium Carbonate-Vitamin D (CALCIUM 500 + D) 500-125 MG-UNIT TABS Take 1 tablet by mouth daily.    . cetirizine (ZYRTEC) 10 MG tablet Take 10 mg by mouth daily.     . clonazePAM (KLONOPIN) 1 MG tablet Take 1 mg by mouth 3 (three) times daily as needed for anxiety.    . docusate sodium (COLACE) 100 MG capsule Take 100 mg by mouth 2 (two) times daily.    Marland Kitchen doxepin (SINEQUAN) 25 MG capsule Take 25 mg by mouth at bedtime.     Marland Kitchen ibuprofen (ADVIL) 200 MG tablet Take 400-800 mg by mouth every 6 (six) hours as needed for headache or moderate pain.    Marland Kitchen lamoTRIgine (LAMICTAL) 200 MG tablet Take 200 mg by mouth at bedtime.     . lidocaine-prilocaine (EMLA) cream Apply topically over port-a-cath 30 minutes to 1 hour prior to treatment. 30 g 2  . LORazepam (ATIVAN) 0.5 MG tablet Take 1 tablet (0.5 mg total) by mouth every 6 (six) hours as needed (Nausea or vomiting). 20 tablet 0  . lurasidone (LATUDA) 40 MG TABS tablet Take 40 mg by mouth at bedtime. Take with 60 mg to equal 100 mg at night    . Lurasidone HCl 60 MG TABS Take 60 mg by mouth at bedtime. Take with 40 mg to equal 100 mg at night    . metFORMIN (GLUCOPHAGE-XR) 500 MG 24 hr tablet Take 1,000 mg by mouth 2 (two) times daily.    . mometasone (NASONEX) 50 MCG/ACT nasal spray Place 2 sprays into the nose daily as needed (allergies).     . Multiple Vitamin (MULTIVITAMIN WITH MINERALS) TABS tablet Take 1 tablet by mouth daily.    Marland Kitchen omeprazole (PRILOSEC) 20 MG capsule Take 20 mg by mouth daily.     . potassium chloride (KLOR-CON) 10 MEQ tablet TAKE 1 TABLET(10 MEQ) BY MOUTH DAILY 30 tablet 0  . QUEtiapine (SEROQUEL XR) 300 MG 24 hr tablet Take 300 mg by mouth at bedtime.    . simvastatin (ZOCOR) 20 MG tablet Take 20 mg by mouth daily.    Marland Kitchen topiramate (TOPAMAX) 100 MG tablet Take 50-100 mg by mouth See admin instructions.  Take 100 mg in the morning and 50 mg at night    . vitamin B-12 (CYANOCOBALAMIN) 1000 MCG tablet Take 1,000 mcg by mouth daily.    . ondansetron (ZOFRAN) 8 MG tablet Take 1 tablet (8 mg total) by mouth 2 (two) times daily as needed for refractory nausea / vomiting. Start on day 3 after carboplatin chemo. (Patient not taking: Reported on 10/12/2019) 30 tablet 1   No current facility-administered medications for this visit.   Facility-Administered Medications Ordered in Other Visits  Medication Dose Route Frequency Provider Last Rate  Last Admin  . heparin lock flush 100 unit/mL  500 Units Intravenous Once Briante Loveall C, MD      . sodium chloride flush (NS) 0.9 % injection 10 mL  10 mL Intravenous PRN Lequita Asal, MD   10 mL at 11/10/19 8101    Review of Systems  Constitutional: Positive for weight loss (3 lbs). Negative for chills, diaphoresis, fever and malaise/fatigue.       Feels "ok".  HENT: Negative.  Negative for congestion, ear discharge, ear pain, hearing loss, nosebleeds, sinus pain and sore throat.   Eyes: Negative.  Negative for blurred vision, double vision and photophobia.  Respiratory: Negative.  Negative for cough, hemoptysis, sputum production, shortness of breath and stridor.   Cardiovascular: Negative.  Negative for chest pain, palpitations, orthopnea, leg swelling and PND.  Gastrointestinal: Positive for constipation (x 1 week; on MiraLax). Negative for abdominal pain, blood in stool, diarrhea, heartburn (reflux; on antacids; better), melena, nausea and vomiting.       Struggles to drink fluids.  Genitourinary: Negative.  Negative for dysuria, frequency, hematuria and urgency.       Premenopausal.  Musculoskeletal: Positive for joint pain (left shoulder). Negative for back pain, myalgias and neck pain.       Left shoulder decreased ROM.  Skin: Negative for itching and rash.       Right breast lump hard to detect.  Neurological: Negative.  Negative for  dizziness, tingling, sensory change, speech change, focal weakness, weakness and headaches.  Endo/Heme/Allergies: Does not bruise/bleed easily.       Type II diabetes.  Psychiatric/Behavioral: Negative.  Negative for depression and memory loss. The patient is not nervous/anxious and does not have insomnia.   All other systems reviewed and are negative.  Performance status (ECOG): 1  Vitals Temperature (!) 96.7 F (35.9 C), temperature source Tympanic, resp. rate 18, weight 134 lb 7.7 oz (61 kg).   Physical Exam  Constitutional: She is oriented to person, place, and time. She appears well-developed and well-nourished. No distress.  HENT:  Head: Normocephalic and atraumatic.  Mouth/Throat: Oropharynx is clear and moist. No oropharyngeal exudate.  Pink head wrap. Mask.  Eyes: Pupils are equal, round, and reactive to light. Conjunctivae and EOM are normal. No scleral icterus.  Brown eyes.  Neck: No JVD present.  Cardiovascular: Normal rate, regular rhythm and normal heart sounds.  No murmur heard. Pulmonary/Chest: Effort normal and breath sounds normal. No respiratory distress. She has no wheezes. She has no rales. She exhibits no tenderness.  RIGHT breast mass indistinct and difficult to measure; 1.0 cm x 1.4 cm (previously 1.6 cm x 1.4 cm).  Abdominal: Soft. Bowel sounds are normal. She exhibits no distension and no mass. There is no abdominal tenderness. There is no rebound and no guarding.  Musculoskeletal:        General: No edema.     Left shoulder: Tenderness present. Decreased range of motion (abduction and posterior rotation).     Cervical back: Normal range of motion and neck supple.  Lymphadenopathy:       Head (right side): No preauricular, no posterior auricular and no occipital adenopathy present.       Head (left side): No preauricular, no posterior auricular and no occipital adenopathy present.    She has no cervical adenopathy.    She has no axillary adenopathy.        Right: No inguinal and no supraclavicular adenopathy present.       Left: No inguinal  and no supraclavicular adenopathy present.  Neurological: She is alert and oriented to person, place, and time.  Skin: Skin is warm and dry. No rash noted. She is not diaphoretic. No erythema. No pallor.  Psychiatric: She has a normal mood and affect. Her behavior is normal. Judgment and thought content normal.  Nursing note and vitals reviewed.   Infusion on 11/10/2019  Component Date Value Ref Range Status  . Sodium 11/10/2019 135  135 - 145 mmol/L Final  . Potassium 11/10/2019 3.4* 3.5 - 5.1 mmol/L Final  . Chloride 11/10/2019 105  98 - 111 mmol/L Final  . CO2 11/10/2019 21* 22 - 32 mmol/L Final  . Glucose, Bld 11/10/2019 125* 70 - 99 mg/dL Final   Glucose reference range applies only to samples taken after fasting for at least 8 hours.  . BUN 11/10/2019 13  6 - 20 mg/dL Final  . Creatinine, Ser 11/10/2019 0.77  0.44 - 1.00 mg/dL Final  . Calcium 11/10/2019 9.0  8.9 - 10.3 mg/dL Final  . Total Protein 11/10/2019 6.7  6.5 - 8.1 g/dL Final  . Albumin 11/10/2019 4.2  3.5 - 5.0 g/dL Final  . AST 11/10/2019 18  15 - 41 U/L Final  . ALT 11/10/2019 18  0 - 44 U/L Final  . Alkaline Phosphatase 11/10/2019 44  38 - 126 U/L Final  . Total Bilirubin 11/10/2019 0.5  0.3 - 1.2 mg/dL Final  . GFR calc non Af Amer 11/10/2019 >60  >60 mL/min Final  . GFR calc Af Amer 11/10/2019 >60  >60 mL/min Final  . Anion gap 11/10/2019 9  5 - 15 Final   Performed at [facility], [address] [place], [place] [address]  . Magnesium 11/10/2019 1.9  1.7 - 2.4 mg/dL Final   Performed at [facility], [address] [place], [place] [address]  . WBC 11/10/2019 4.1  4.0 - 10.5 K/uL Final  . RBC 11/10/2019 3.54* 3.87 - 5.11 MIL/uL Final  . Hemoglobin 11/10/2019 10.3* 12.0 - 15.0 g/dL Final  . HCT 11/10/2019 31.9* 36.0 - 46.0 % Final  . MCV 11/10/2019 90.1  80.0 - 100.0 fL Final  . MCH 11/10/2019  29.1  26.0 - 34.0 pg Final  . MCHC 11/10/2019 32.3  30.0 - 36.0 g/dL Final  . RDW 11/10/2019 16.9* 11.5 - 15.5 % Final  . Platelets 11/10/2019 312  150 - 400 K/uL Final  . nRBC 11/10/2019 0.0  0.0 - 0.2 % Final  . Neutrophils Relative % 11/10/2019 48  % Final  . Neutro Abs 11/10/2019 2.0  1.7 - 7.7 K/uL Final  . Lymphocytes Relative 11/10/2019 43  % Final  . Lymphs Abs 11/10/2019 1.7  0.7 - 4.0 K/uL Final  . Monocytes Relative 11/10/2019 4  % Final  . Monocytes Absolute 11/10/2019 0.2  0.1 - 1.0 K/uL Final  . Eosinophils Relative 11/10/2019 3  % Final  . Eosinophils Absolute 11/10/2019 0.1  0.0 - 0.5 K/uL Final  . Basophils Relative 11/10/2019 1  % Final  . Basophils Absolute 11/10/2019 0.0  0.0 - 0.1 K/uL Final  . Immature Granulocytes 11/10/2019 1  % Final  . Abs Immature Granulocytes 11/10/2019 0.02  0.00 - 0.07 K/uL Final   Performed at [facility], [address] [place], [place] [address]  . Preg Test, Ur 11/10/2019 NEGATIVE  NEGATIVE Final   Performed at [facility], [address], [place], [place] [address]    Assessment:  Heather Bell is a 44 y.o. female with clinical T1cN1triple negativerightbreast cancers/p ultrasound guided core biopsy. Pathology revealeda grade IIIinvasive mammary carcinoma, no special type of the right breast. Specimen was 10 mm.Ductal carcinoma in situ(DCIS)was not identified.Right axillarylymph nodewas positive for metastatic carcinoma.Tumor was ER - (<1%), PR - (<1%), and Her2/neu -.  Bilateral mammogram and right unilateral ultrasoundon 07/10/2019 revealed a 1.7 x 1.6 x 1.6 cmmass at the7 o'clock region of the right breast 7 cm from the nipple. There was an abnormal1.4 x 0.7 x 0.9 cmaxillary lymph node. Therewas a second3 mmaxillary lymph node with minimal focal cortical thickening.An ultrasound-guided core biopsies of the mass and the enlarged right axillary lymph node was recommended. BI-RADS  category 5.  PET scanon 08/11/2019 showed a hypermetabolic mass in the inferior RIGHT breast consistentwith herbreast carcinoma. There was noevidence hypermetabolic nodal metastasis.There was no distant metastatic disease.There was nopulmonary metastasis or skeletal metastasis.  Echoon 12/24/2020revealedan ejection fraction of50 to 55%. The left ventricle demonstrated global hypokinesis.MUGAon 08/24/2019 revealed an EF of 60%.She is followed by Dr End.  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  She is s/p week #9of Taxol + carboplatin(08/25/2019 - 11/03/2019).  Carboplatin was held on week #7 and 8 secondary to marginal counts.  She received Granix on 10/28/2019.  Breast masswas larger after initiation of treatment felt secondary to post biopsy hematoma. Right breast ultrasoundon 09/07/2019 revealed the mass in the 7 o'clock position contained cystic components and measured 3 cm (compared to 1.7 cm on 07/10/2019). The biopsied lymph node with 3 mm cortical thickening in the axilla was similar. Right breast ultrasoundon 10/09/2019 revealed interval decrease in size of RIGHT breast mass (1.4 x 1.7 x 1.4 cm). Mass was less well defined and associated with parenchymal edema.   She is premenopausal. She stopped taking birth control pills on 08/17/2019.  She has iron deficiency. Ferritin was 21 with an iron saturation of 7% and a TIBC 343 on 09/08/2019.She received Venoferweekly x 3 (09/18/2019 - 10/06/2019).  She has B12 deficiency. B12 was 240 on 09/08/2019 and 396 on 10/23/2019.  She is on oral B12.  She has recurrent orthostatic hypotension. Fluid intake is marginal. AM cortisol was 5.3 (low) on 09/22/2019 suggestive of adrenal insufficiency.ACTH stim test was negative.  Symptomatically, she feels "ok".  She has lost 3 pounds.  Fluid intake  remains marginal.  She has ongoing left shoulder pain with decreased ROM.  Plan: 1.   Labs today: CBC with diff, CMP, Mg, urine pregnancy test.  2.Clinical stageIIB triple negativeright breast cancer PET scan- no evidence of metastatic disease. Patient undergoingneoadjuvant chemotherapy. Weekly Taxol + carboplatin x 12 followed by AC x 4 with Neulasta support. She is s/p week #9Taxol and carboplatin. She continues to tolerate treatment well. She denies any neuropathy. She has required Granix in the past for low counts. Breast mass is indistinct. Plan follow-up ultrasound after 12 weeks of Taxol prior to [facility].  Labs reviewed.  Week #10 Taxol + carboplatin. 3. Iron deficiency anemia Hematocrit 31.9, hemoglobin 10.3, MCV 90.1 today. Ferritin was 19 on 09/08/2019.  Shereceived weekly Venofer x 3 (last 10/06/2019). Suspect current chemotherapy induced anemia.  Check iron stores at next visit. 4. B12 deficiency B12 was 240 on 09/08/2019 and 396 on 10/23/2019. Check B12 at next visit 5.Dehydration  Continue IVF every Tuesday and Friday.  IVF 1 liter today.  RTC on 11/13/2019 for IVF. 6.   Left shoulder  pain  Etiology unclear.  Patient has limited range of motion.  She notes pain noticeable after port-a-cath placement .   Doubt related to port-a-cath.  Contact Dr. Windell Moment.  Patient to be seen in clinic. 7.Week #10 Taxol and carboplatin today. 8.   RTC in 1 week for labs (CBC with diff, CMP, Mg) and week #11 Taxol + carboplatin and IVF. 9.  RTC on 11/20/2019 for IVF. 10.   RTC in 2 weeks for MD assessment, labs (CBC witj diff, CMP, Mg, ferritin, iron studies, B12), week #12 Taxol + carboplatin, and IVF.  I discussed the assessment  and treatment plan with the patient.  The patient was provided an opportunity to ask questions and all were answered.  The patient agreed with the plan and demonstrated an understanding of the instructions.  The patient was advised to call back if the symptoms worsen or if the condition fails to improve as anticipated.   Lequita Asal, MD, PhD    11/10/2019, 9:40 AM  I, Selena Batten, am acting as scribe for [employer]. Mike Gip, MD, PhD.  I, Chenell Lozon C. Mike Gip, MD, have reviewed the above documentation for accuracy and completeness, and I agree with the above.

## 2019-11-10 ENCOUNTER — Inpatient Hospital Stay: Payer: BC Managed Care – PPO

## 2019-11-10 ENCOUNTER — Other Ambulatory Visit: Payer: Self-pay

## 2019-11-10 ENCOUNTER — Inpatient Hospital Stay (HOSPITAL_BASED_OUTPATIENT_CLINIC_OR_DEPARTMENT_OTHER): Payer: BC Managed Care – PPO | Admitting: Hematology and Oncology

## 2019-11-10 ENCOUNTER — Other Ambulatory Visit: Payer: Self-pay | Admitting: *Deleted

## 2019-11-10 ENCOUNTER — Encounter: Payer: Self-pay | Admitting: Hematology and Oncology

## 2019-11-10 ENCOUNTER — Telehealth: Payer: Self-pay

## 2019-11-10 VITALS — BP 101/66 | HR 108 | Temp 96.7°F | Resp 18

## 2019-11-10 VITALS — BP 90/63 | HR 111 | Temp 96.7°F | Resp 18 | Wt 134.5 lb

## 2019-11-10 DIAGNOSIS — E538 Deficiency of other specified B group vitamins: Secondary | ICD-10-CM | POA: Diagnosis not present

## 2019-11-10 DIAGNOSIS — C50919 Malignant neoplasm of unspecified site of unspecified female breast: Secondary | ICD-10-CM

## 2019-11-10 DIAGNOSIS — D6481 Anemia due to antineoplastic chemotherapy: Secondary | ICD-10-CM | POA: Diagnosis not present

## 2019-11-10 DIAGNOSIS — D509 Iron deficiency anemia, unspecified: Secondary | ICD-10-CM | POA: Diagnosis not present

## 2019-11-10 DIAGNOSIS — R7989 Other specified abnormal findings of blood chemistry: Secondary | ICD-10-CM

## 2019-11-10 DIAGNOSIS — Z5111 Encounter for antineoplastic chemotherapy: Secondary | ICD-10-CM

## 2019-11-10 DIAGNOSIS — E2740 Unspecified adrenocortical insufficiency: Secondary | ICD-10-CM

## 2019-11-10 DIAGNOSIS — T451X5A Adverse effect of antineoplastic and immunosuppressive drugs, initial encounter: Secondary | ICD-10-CM

## 2019-11-10 DIAGNOSIS — E860 Dehydration: Secondary | ICD-10-CM

## 2019-11-10 DIAGNOSIS — M25512 Pain in left shoulder: Secondary | ICD-10-CM

## 2019-11-10 DIAGNOSIS — C50511 Malignant neoplasm of lower-outer quadrant of right female breast: Secondary | ICD-10-CM | POA: Diagnosis not present

## 2019-11-10 LAB — CBC WITH DIFFERENTIAL/PLATELET
Abs Immature Granulocytes: 0.02 10*3/uL (ref 0.00–0.07)
Basophils Absolute: 0 10*3/uL (ref 0.0–0.1)
Basophils Relative: 1 %
Eosinophils Absolute: 0.1 10*3/uL (ref 0.0–0.5)
Eosinophils Relative: 3 %
HCT: 31.9 % — ABNORMAL LOW (ref 36.0–46.0)
Hemoglobin: 10.3 g/dL — ABNORMAL LOW (ref 12.0–15.0)
Immature Granulocytes: 1 %
Lymphocytes Relative: 43 %
Lymphs Abs: 1.7 10*3/uL (ref 0.7–4.0)
MCH: 29.1 pg (ref 26.0–34.0)
MCHC: 32.3 g/dL (ref 30.0–36.0)
MCV: 90.1 fL (ref 80.0–100.0)
Monocytes Absolute: 0.2 10*3/uL (ref 0.1–1.0)
Monocytes Relative: 4 %
Neutro Abs: 2 10*3/uL (ref 1.7–7.7)
Neutrophils Relative %: 48 %
Platelets: 312 10*3/uL (ref 150–400)
RBC: 3.54 MIL/uL — ABNORMAL LOW (ref 3.87–5.11)
RDW: 16.9 % — ABNORMAL HIGH (ref 11.5–15.5)
WBC: 4.1 10*3/uL (ref 4.0–10.5)
nRBC: 0 % (ref 0.0–0.2)

## 2019-11-10 LAB — COMPREHENSIVE METABOLIC PANEL
ALT: 18 U/L (ref 0–44)
AST: 18 U/L (ref 15–41)
Albumin: 4.2 g/dL (ref 3.5–5.0)
Alkaline Phosphatase: 44 U/L (ref 38–126)
Anion gap: 9 (ref 5–15)
BUN: 13 mg/dL (ref 6–20)
CO2: 21 mmol/L — ABNORMAL LOW (ref 22–32)
Calcium: 9 mg/dL (ref 8.9–10.3)
Chloride: 105 mmol/L (ref 98–111)
Creatinine, Ser: 0.77 mg/dL (ref 0.44–1.00)
GFR calc Af Amer: >60 mL/min (ref >60)
GFR calc non Af Amer: >60 mL/min (ref >60)
Glucose, Bld: 125 mg/dL — ABNORMAL HIGH (ref 70–99)
Potassium: 3.4 mmol/L — ABNORMAL LOW (ref 3.5–5.1)
Sodium: 135 mmol/L (ref 135–145)
Total Bilirubin: 0.5 mg/dL (ref 0.3–1.2)
Total Protein: 6.7 g/dL (ref 6.5–8.1)

## 2019-11-10 LAB — PREGNANCY, URINE: Preg Test, Ur: NEGATIVE

## 2019-11-10 LAB — MAGNESIUM: Magnesium: 1.9 mg/dL (ref 1.7–2.4)

## 2019-11-10 MED ORDER — POTASSIUM CHLORIDE ER 10 MEQ PO TBCR: 10.0000 meq | EXTENDED_RELEASE_TABLET | ORAL | 0 refills | Status: DC

## 2019-11-10 MED ORDER — SODIUM CHLORIDE 0.9 % IV SOLN
Freq: Once | INTRAVENOUS | Status: AC
  Filled 2019-11-10: qty 250

## 2019-11-10 MED ORDER — DIPHENHYDRAMINE HCL 50 MG/ML IJ SOLN
50.0000 mg | Freq: Once | INTRAMUSCULAR | Status: AC
  Administered 2019-11-10: 50 mg via INTRAVENOUS
  Filled 2019-11-10: qty 1

## 2019-11-10 MED ORDER — PALONOSETRON HCL INJECTION 0.25 MG/5ML
0.2500 mg | Freq: Once | INTRAVENOUS | Status: AC
  Administered 2019-11-10: 0.25 mg via INTRAVENOUS
  Filled 2019-11-10: qty 5

## 2019-11-10 MED ORDER — SODIUM CHLORIDE 0.9 % IV SOLN
10.0000 mg | Freq: Once | INTRAVENOUS | Status: AC
  Administered 2019-11-10: 10 mg via INTRAVENOUS
  Filled 2019-11-10: qty 10

## 2019-11-10 MED ORDER — SODIUM CHLORIDE 0.9% FLUSH
10.0000 mL | INTRAVENOUS | Status: DC | PRN
  Administered 2019-11-10: 10 mL via INTRAVENOUS
  Filled 2019-11-10: qty 10

## 2019-11-10 MED ORDER — HEPARIN SOD (PORK) LOCK FLUSH 100 UNIT/ML IV SOLN
500.0000 [IU] | Freq: Once | INTRAVENOUS | Status: AC
  Administered 2019-11-10: 500 [IU] via INTRAVENOUS
  Filled 2019-11-10: qty 5

## 2019-11-10 MED ORDER — SODIUM CHLORIDE 0.9 % IV SOLN
80.0000 mg/m2 | Freq: Once | INTRAVENOUS | Status: AC
  Administered 2019-11-10: 132 mg via INTRAVENOUS
  Filled 2019-11-10: qty 22

## 2019-11-10 MED ORDER — SODIUM CHLORIDE 0.9 % IV SOLN
166.2000 mg | Freq: Once | INTRAVENOUS | Status: AC
  Administered 2019-11-10: 170 mg via INTRAVENOUS
  Filled 2019-11-10: qty 17

## 2019-11-10 MED ORDER — FAMOTIDINE IN NACL 20-0.9 MG/50ML-% IV SOLN
20.0000 mg | Freq: Once | INTRAVENOUS | Status: AC
  Administered 2019-11-10: 20 mg via INTRAVENOUS
  Filled 2019-11-10: qty 50

## 2019-11-10 NOTE — Progress Notes (Signed)
HR 111,  Normal for patient however ok to tx per MD and patient will get fluids today

## 2019-11-10 NOTE — Telephone Encounter (Signed)
Called Dr Windell Moment general surgery office regarding patients complaint of left shoulder pain and decreased range of motion since port placement (08/07/19). The nurse scheduled patient to be seen tomorrow 11/11/19 @ 4:00. Patient verbalizes understanding of appointment time date and location.

## 2019-11-10 NOTE — Patient Instructions (Signed)
Carboplatin injection What is this medicine? CARBOPLATIN (KAR boe pla tin) is a chemotherapy drug. It targets fast dividing cells, like cancer cells, and causes these cells to die. This medicine is used to treat ovarian cancer and many other cancers. This medicine may be used for other purposes; ask your health care provider or pharmacist if you have questions. COMMON BRAND NAME(S): Paraplatin What should I tell my health care provider before I take this medicine? They need to know if you have any of these conditions:  blood disorders  hearing problems  kidney disease  recent or ongoing radiation therapy  an unusual or allergic reaction to carboplatin, cisplatin, other chemotherapy, other medicines, foods, dyes, or preservatives  pregnant or trying to get pregnant  breast-feeding How should I use this medicine? This drug is usually given as an infusion into a vein. It is administered in a hospital or clinic by a specially trained health care professional. Talk to your pediatrician regarding the use of this medicine in children. Special care may be needed. Overdosage: If you think you have taken too much of this medicine contact a poison control center or emergency room at once. NOTE: This medicine is only for you. Do not share this medicine with others. What if I miss a dose? It is important not to miss a dose. Call your doctor or health care professional if you are unable to keep an appointment. What may interact with this medicine?  medicines for seizures  medicines to increase blood counts like filgrastim, pegfilgrastim, sargramostim  some antibiotics like amikacin, gentamicin, neomycin, streptomycin, tobramycin  vaccines Talk to your doctor or health care professional before taking any of these medicines:  acetaminophen  aspirin  ibuprofen  ketoprofen  naproxen This list may not describe all possible interactions. Give your health care provider a list of all the  medicines, herbs, non-prescription drugs, or dietary supplements you use. Also tell them if you smoke, drink alcohol, or use illegal drugs. Some items may interact with your medicine. What should I watch for while using this medicine? Your condition will be monitored carefully while you are receiving this medicine. You will need important blood work done while you are taking this medicine. This drug may make you feel generally unwell. This is not uncommon, as chemotherapy can affect healthy cells as well as cancer cells. Report any side effects. Continue your course of treatment even though you feel ill unless your doctor tells you to stop. In some cases, you may be given additional medicines to help with side effects. Follow all directions for their use. Call your doctor or health care professional for advice if you get a fever, chills or sore throat, or other symptoms of a cold or flu. Do not treat yourself. This drug decreases your body's ability to fight infections. Try to avoid being around people who are sick. This medicine may increase your risk to bruise or bleed. Call your doctor or health care professional if you notice any unusual bleeding. Be careful brushing and flossing your teeth or using a toothpick because you may get an infection or bleed more easily. If you have any dental work done, tell your dentist you are receiving this medicine. Avoid taking products that contain aspirin, acetaminophen, ibuprofen, naproxen, or ketoprofen unless instructed by your doctor. These medicines may hide a fever. Do not become pregnant while taking this medicine. Women should inform their doctor if they wish to become pregnant or think they might be pregnant. There is a   potential for serious side effects to an unborn child. Talk to your health care professional or pharmacist for more information. Do not breast-feed an infant while taking this medicine. What side effects may I notice from receiving this  medicine? Side effects that you should report to your doctor or health care professional as soon as possible:  allergic reactions like skin rash, itching or hives, swelling of the face, lips, or tongue  signs of infection - fever or chills, cough, sore throat, pain or difficulty passing urine  signs of decreased platelets or bleeding - bruising, pinpoint red spots on the skin, black, tarry stools, nosebleeds  signs of decreased red blood cells - unusually weak or tired, fainting spells, lightheadedness  breathing problems  changes in hearing  changes in vision  chest pain  high blood pressure  low blood counts - This drug may decrease the number of white blood cells, red blood cells and platelets. You may be at increased risk for infections and bleeding.  nausea and vomiting  pain, swelling, redness or irritation at the injection site  pain, tingling, numbness in the hands or feet  problems with balance, talking, walking  trouble passing urine or change in the amount of urine Side effects that usually do not require medical attention (report to your doctor or health care professional if they continue or are bothersome):  hair loss  loss of appetite  metallic taste in the mouth or changes in taste This list may not describe all possible side effects. Call your doctor for medical advice about side effects. You may report side effects to FDA at 1-800-FDA-1088. Where should I keep my medicine? This drug is given in a hospital or clinic and will not be stored at home. NOTE: This sheet is a summary. It may not cover all possible information. If you have questions about this medicine, talk to your doctor, pharmacist, or health care provider.  2020 Elsevier/Gold Standard (2007-11-11 14:38:05)  Paclitaxel injection What is this medicine? PACLITAXEL (PAK li TAX el) is a chemotherapy drug. It targets fast dividing cells, like cancer cells, and causes these cells to die. This  medicine is used to treat ovarian cancer, breast cancer, lung cancer, Kaposi's sarcoma, and other cancers. This medicine may be used for other purposes; ask your health care provider or pharmacist if you have questions. COMMON BRAND NAME(S): Onxol, Taxol What should I tell my health care provider before I take this medicine? They need to know if you have any of these conditions:  history of irregular heartbeat  liver disease  low blood counts, like low white cell, platelet, or red cell counts  lung or breathing disease, like asthma  tingling of the fingers or toes, or other nerve disorder  an unusual or allergic reaction to paclitaxel, alcohol, polyoxyethylated castor oil, other chemotherapy, other medicines, foods, dyes, or preservatives  pregnant or trying to get pregnant  breast-feeding How should I use this medicine? This drug is given as an infusion into a vein. It is administered in a hospital or clinic by a specially trained health care professional. Talk to your pediatrician regarding the use of this medicine in children. Special care may be needed. Overdosage: If you think you have taken too much of this medicine contact a poison control center or emergency room at once. NOTE: This medicine is only for you. Do not share this medicine with others. What if I miss a dose? It is important not to miss your dose. Call your doctor   or health care professional if you are unable to keep an appointment. What may interact with this medicine? Do not take this medicine with any of the following medications:  disulfiram  metronidazole This medicine may also interact with the following medications:  antiviral medicines for hepatitis, HIV or AIDS  certain antibiotics like erythromycin and clarithromycin  certain medicines for fungal infections like ketoconazole and itraconazole  certain medicines for seizures like carbamazepine, phenobarbital,  phenytoin  gemfibrozil  nefazodone  rifampin  St. John's wort This list may not describe all possible interactions. Give your health care provider a list of all the medicines, herbs, non-prescription drugs, or dietary supplements you use. Also tell them if you smoke, drink alcohol, or use illegal drugs. Some items may interact with your medicine. What should I watch for while using this medicine? Your condition will be monitored carefully while you are receiving this medicine. You will need important blood work done while you are taking this medicine. This medicine can cause serious allergic reactions. To reduce your risk you will need to take other medicine(s) before treatment with this medicine. If you experience allergic reactions like skin rash, itching or hives, swelling of the face, lips, or tongue, tell your doctor or health care professional right away. In some cases, you may be given additional medicines to help with side effects. Follow all directions for their use. This drug may make you feel generally unwell. This is not uncommon, as chemotherapy can affect healthy cells as well as cancer cells. Report any side effects. Continue your course of treatment even though you feel ill unless your doctor tells you to stop. Call your doctor or health care professional for advice if you get a fever, chills or sore throat, or other symptoms of a cold or flu. Do not treat yourself. This drug decreases your body's ability to fight infections. Try to avoid being around people who are sick. This medicine may increase your risk to bruise or bleed. Call your doctor or health care professional if you notice any unusual bleeding. Be careful brushing and flossing your teeth or using a toothpick because you may get an infection or bleed more easily. If you have any dental work done, tell your dentist you are receiving this medicine. Avoid taking products that contain aspirin, acetaminophen, ibuprofen,  naproxen, or ketoprofen unless instructed by your doctor. These medicines may hide a fever. Do not become pregnant while taking this medicine. Women should inform their doctor if they wish to become pregnant or think they might be pregnant. There is a potential for serious side effects to an unborn child. Talk to your health care professional or pharmacist for more information. Do not breast-feed an infant while taking this medicine. Men are advised not to father a child while receiving this medicine. This product may contain alcohol. Ask your pharmacist or healthcare provider if this medicine contains alcohol. Be sure to tell all healthcare providers you are taking this medicine. Certain medicines, like metronidazole and disulfiram, can cause an unpleasant reaction when taken with alcohol. The reaction includes flushing, headache, nausea, vomiting, sweating, and increased thirst. The reaction can last from 30 minutes to several hours. What side effects may I notice from receiving this medicine? Side effects that you should report to your doctor or health care professional as soon as possible:  allergic reactions like skin rash, itching or hives, swelling of the face, lips, or tongue  breathing problems  changes in vision  fast, irregular heartbeat  high   or low blood pressure  mouth sores  pain, tingling, numbness in the hands or feet  signs of decreased platelets or bleeding - bruising, pinpoint red spots on the skin, black, tarry stools, blood in the urine  signs of decreased red blood cells - unusually weak or tired, feeling faint or lightheaded, falls  signs of infection - fever or chills, cough, sore throat, pain or difficulty passing urine  signs and symptoms of liver injury like dark yellow or brown urine; general ill feeling or flu-like symptoms; light-colored stools; loss of appetite; nausea; right upper belly pain; unusually weak or tired; yellowing of the eyes or  skin  swelling of the ankles, feet, hands  unusually slow heartbeat Side effects that usually do not require medical attention (report to your doctor or health care professional if they continue or are bothersome):  diarrhea  hair loss  loss of appetite  muscle or joint pain  nausea, vomiting  pain, redness, or irritation at site where injected  tiredness This list may not describe all possible side effects. Call your doctor for medical advice about side effects. You may report side effects to FDA at 1-800-FDA-1088. Where should I keep my medicine? This drug is given in a hospital or clinic and will not be stored at home. NOTE: This sheet is a summary. It may not cover all possible information. If you have questions about this medicine, talk to your doctor, pharmacist, or health care provider.  2020 Elsevier/Gold Standard (2017-04-09 13:14:55)  

## 2019-11-10 NOTE — Progress Notes (Signed)
Patient has had some constipation this week she has taken Miralax but thinks she needs to take more. She is still having left shoulder pain and decreased range of motion. She would like to be referred to someone for this. She was having some dizziness and lightheadedness last week which was resolved by receiving fluids last Friday. Orthostatic BP today: sitting: 92/63 heart rate 98. Standing: 90/63, heart rate 111.

## 2019-11-13 ENCOUNTER — Inpatient Hospital Stay: Payer: BC Managed Care – PPO

## 2019-11-13 VITALS — BP 105/70 | HR 86 | Temp 98.8°F | Resp 17

## 2019-11-13 DIAGNOSIS — Z5111 Encounter for antineoplastic chemotherapy: Secondary | ICD-10-CM

## 2019-11-13 DIAGNOSIS — C50919 Malignant neoplasm of unspecified site of unspecified female breast: Secondary | ICD-10-CM

## 2019-11-13 DIAGNOSIS — D509 Iron deficiency anemia, unspecified: Secondary | ICD-10-CM

## 2019-11-13 DIAGNOSIS — C50511 Malignant neoplasm of lower-outer quadrant of right female breast: Secondary | ICD-10-CM | POA: Diagnosis not present

## 2019-11-13 LAB — CBC WITH DIFFERENTIAL/PLATELET
Abs Immature Granulocytes: 0.01 10*3/uL (ref 0.00–0.07)
Basophils Absolute: 0 10*3/uL (ref 0.0–0.1)
Basophils Relative: 1 %
Eosinophils Absolute: 0.1 10*3/uL (ref 0.0–0.5)
Eosinophils Relative: 2 %
HCT: 28.9 % — ABNORMAL LOW (ref 36.0–46.0)
Hemoglobin: 9.5 g/dL — ABNORMAL LOW (ref 12.0–15.0)
Immature Granulocytes: 0 %
Lymphocytes Relative: 39 %
Lymphs Abs: 1.5 10*3/uL (ref 0.7–4.0)
MCH: 29.7 pg (ref 26.0–34.0)
MCHC: 32.9 g/dL (ref 30.0–36.0)
MCV: 90.3 fL (ref 80.0–100.0)
Monocytes Absolute: 0.2 10*3/uL (ref 0.1–1.0)
Monocytes Relative: 4 %
Neutro Abs: 2.1 10*3/uL (ref 1.7–7.7)
Neutrophils Relative %: 54 %
Platelets: 281 10*3/uL (ref 150–400)
RBC: 3.2 MIL/uL — ABNORMAL LOW (ref 3.87–5.11)
RDW: 17.5 % — ABNORMAL HIGH (ref 11.5–15.5)
WBC: 3.8 10*3/uL — ABNORMAL LOW (ref 4.0–10.5)
nRBC: 0 % (ref 0.0–0.2)

## 2019-11-13 MED ORDER — SODIUM CHLORIDE 0.9 % IV SOLN
Freq: Once | INTRAVENOUS | Status: AC
  Filled 2019-11-13: qty 250

## 2019-11-13 MED ORDER — HEPARIN SOD (PORK) LOCK FLUSH 100 UNIT/ML IV SOLN
INTRAVENOUS | Status: AC
  Filled 2019-11-13: qty 5

## 2019-11-13 MED ORDER — SODIUM CHLORIDE 0.9% FLUSH
10.0000 mL | INTRAVENOUS | Status: DC | PRN
  Administered 2019-11-13: 10 mL via INTRAVENOUS
  Filled 2019-11-13: qty 10

## 2019-11-13 MED ORDER — HEPARIN SOD (PORK) LOCK FLUSH 100 UNIT/ML IV SOLN
500.0000 [IU] | Freq: Once | INTRAVENOUS | Status: AC
  Administered 2019-11-13: 500 [IU] via INTRAVENOUS
  Filled 2019-11-13: qty 5

## 2019-11-13 NOTE — Progress Notes (Signed)
Patient states she went to MD who did her port insertion due to the pain in her Left shoulder. He is going to refer her to an orthopedic MD as he doesn't think it is related to her port a cath.

## 2019-11-16 ENCOUNTER — Telehealth: Payer: Self-pay

## 2019-11-16 NOTE — Telephone Encounter (Signed)
The patient called and report she has pain with redness noted to her anual area x 1 week.  I have informed her that she need to be seen by her PCP. She wanted to kinow if Dr Mike Gip would be able to look at it today. I informed the patient that Dr Mike Gip does not handle that area. She will need to be seen by her PCP. The patient was understanding and and agreeable to make appointment with her PCP.

## 2019-11-17 ENCOUNTER — Inpatient Hospital Stay: Payer: BC Managed Care – PPO

## 2019-11-17 ENCOUNTER — Other Ambulatory Visit: Payer: Self-pay | Admitting: Hematology and Oncology

## 2019-11-17 VITALS — BP 101/66 | HR 97 | Temp 98.9°F | Resp 17

## 2019-11-17 DIAGNOSIS — C50511 Malignant neoplasm of lower-outer quadrant of right female breast: Secondary | ICD-10-CM | POA: Diagnosis not present

## 2019-11-17 DIAGNOSIS — Z5111 Encounter for antineoplastic chemotherapy: Secondary | ICD-10-CM

## 2019-11-17 DIAGNOSIS — C50919 Malignant neoplasm of unspecified site of unspecified female breast: Secondary | ICD-10-CM

## 2019-11-17 DIAGNOSIS — D509 Iron deficiency anemia, unspecified: Secondary | ICD-10-CM

## 2019-11-17 LAB — CBC WITH DIFFERENTIAL/PLATELET
Abs Immature Granulocytes: 0.02 10*3/uL (ref 0.00–0.07)
Basophils Absolute: 0 10*3/uL (ref 0.0–0.1)
Basophils Relative: 1 %
Eosinophils Absolute: 0.1 10*3/uL (ref 0.0–0.5)
Eosinophils Relative: 2 %
HCT: 30.9 % — ABNORMAL LOW (ref 36.0–46.0)
Hemoglobin: 10.2 g/dL — ABNORMAL LOW (ref 12.0–15.0)
Immature Granulocytes: 1 %
Lymphocytes Relative: 51 %
Lymphs Abs: 2.1 10*3/uL (ref 0.7–4.0)
MCH: 30.3 pg (ref 26.0–34.0)
MCHC: 33 g/dL (ref 30.0–36.0)
MCV: 91.7 fL (ref 80.0–100.0)
Monocytes Absolute: 0.3 10*3/uL (ref 0.1–1.0)
Monocytes Relative: 7 %
Neutro Abs: 1.5 10*3/uL — ABNORMAL LOW (ref 1.7–7.7)
Neutrophils Relative %: 38 %
Platelets: 292 10*3/uL (ref 150–400)
RBC: 3.37 MIL/uL — ABNORMAL LOW (ref 3.87–5.11)
RDW: 17.4 % — ABNORMAL HIGH (ref 11.5–15.5)
WBC: 4 10*3/uL (ref 4.0–10.5)
nRBC: 0 % (ref 0.0–0.2)

## 2019-11-17 LAB — COMPREHENSIVE METABOLIC PANEL
ALT: 19 U/L (ref 0–44)
AST: 16 U/L (ref 15–41)
Albumin: 4.1 g/dL (ref 3.5–5.0)
Alkaline Phosphatase: 42 U/L (ref 38–126)
Anion gap: 8 (ref 5–15)
BUN: 16 mg/dL (ref 6–20)
CO2: 23 mmol/L (ref 22–32)
Calcium: 9 mg/dL (ref 8.9–10.3)
Chloride: 108 mmol/L (ref 98–111)
Creatinine, Ser: 0.64 mg/dL (ref 0.44–1.00)
GFR calc Af Amer: >60 mL/min (ref >60)
GFR calc non Af Amer: >60 mL/min (ref >60)
Glucose, Bld: 78 mg/dL (ref 70–99)
Potassium: 3.9 mmol/L (ref 3.5–5.1)
Sodium: 139 mmol/L (ref 135–145)
Total Bilirubin: 0.2 mg/dL — ABNORMAL LOW (ref 0.3–1.2)
Total Protein: 6.9 g/dL (ref 6.5–8.1)

## 2019-11-17 LAB — PREGNANCY, URINE: Preg Test, Ur: NEGATIVE

## 2019-11-17 LAB — MAGNESIUM: Magnesium: 1.9 mg/dL (ref 1.7–2.4)

## 2019-11-17 MED ORDER — HEPARIN SOD (PORK) LOCK FLUSH 100 UNIT/ML IV SOLN
INTRAVENOUS | Status: AC
  Filled 2019-11-17: qty 5

## 2019-11-17 MED ORDER — DIPHENHYDRAMINE HCL 50 MG/ML IJ SOLN
50.0000 mg | Freq: Once | INTRAMUSCULAR | Status: AC
  Administered 2019-11-17: 50 mg via INTRAVENOUS
  Filled 2019-11-17: qty 1

## 2019-11-17 MED ORDER — FAMOTIDINE IN NACL 20-0.9 MG/50ML-% IV SOLN
20.0000 mg | Freq: Once | INTRAVENOUS | Status: AC
  Administered 2019-11-17: 20 mg via INTRAVENOUS
  Filled 2019-11-17: qty 50

## 2019-11-17 MED ORDER — SODIUM CHLORIDE 0.9 % IV SOLN
Freq: Once | INTRAVENOUS | Status: AC
  Filled 2019-11-17: qty 250

## 2019-11-17 MED ORDER — SODIUM CHLORIDE 0.9% FLUSH
10.0000 mL | INTRAVENOUS | Status: DC | PRN
  Administered 2019-11-17: 10 mL via INTRAVENOUS
  Filled 2019-11-17: qty 10

## 2019-11-17 MED ORDER — PALONOSETRON HCL INJECTION 0.25 MG/5ML
0.2500 mg | Freq: Once | INTRAVENOUS | Status: AC
  Administered 2019-11-17: 0.25 mg via INTRAVENOUS
  Filled 2019-11-17: qty 5

## 2019-11-17 MED ORDER — SODIUM CHLORIDE 0.9 % IV SOLN
10.0000 mg | Freq: Once | INTRAVENOUS | Status: AC
  Administered 2019-11-17: 10 mg via INTRAVENOUS
  Filled 2019-11-17: qty 10

## 2019-11-17 MED ORDER — HEPARIN SOD (PORK) LOCK FLUSH 100 UNIT/ML IV SOLN
500.0000 [IU] | Freq: Once | INTRAVENOUS | Status: AC
  Administered 2019-11-17: 500 [IU] via INTRAVENOUS
  Filled 2019-11-17: qty 5

## 2019-11-17 MED ORDER — SODIUM CHLORIDE 0.9 % IV SOLN
166.2000 mg | Freq: Once | INTRAVENOUS | Status: AC
  Administered 2019-11-17: 170 mg via INTRAVENOUS
  Filled 2019-11-17: qty 17

## 2019-11-17 MED ORDER — SODIUM CHLORIDE 0.9 % IV SOLN
80.0000 mg/m2 | Freq: Once | INTRAVENOUS | Status: AC
  Administered 2019-11-17: 132 mg via INTRAVENOUS
  Filled 2019-11-17: qty 22

## 2019-11-17 NOTE — Progress Notes (Signed)
Pharmacist Chemotherapy Monitoring - Follow Up Assessment    I verify that I have reviewed each item in the below checklist:  . Regimen for the patient is scheduled for the appropriate day and plan matches scheduled date. Marland Kitchen Appropriate non-routine labs are ordered dependent on drug ordered. . If applicable, additional medications reviewed and ordered per protocol based on lifetime cumulative doses and/or treatment regimen.   Plan for follow-up and/or issues identified: No . I-vent associated with next due treatment: No . MD and/or nursing notified: No  Talton Delpriore K 11/17/2019 1:16 PM

## 2019-11-17 NOTE — Progress Notes (Signed)
Patient saw PCP yesterday. Was diagnosed with hemorrhoids. Admits to constipation a couple of weeks ago. Given prescription for suppositories  But prescription  was not ready for pick up yet. States she is feeling" fatigued" with bouts of dizziness over the weekend. Abdominal soreness/pain started this am in mid abdomen. Rates pain a 2-3/10 on pain scale. Denies nausea. Had a BM on Sunday" kinda mushy" texture. MD aware of above a neutrophil count of 1.5. Patient states she does want to take her chemotherapy today. Currently receiving 1024ml of IVF over 2 hours.

## 2019-11-18 NOTE — Telephone Encounter (Signed)
Error: The patient reports she has pain with redness to her anal area x 1 week. Corrected form prior note.

## 2019-11-20 ENCOUNTER — Inpatient Hospital Stay: Payer: BC Managed Care – PPO | Attending: Hematology and Oncology

## 2019-11-20 ENCOUNTER — Inpatient Hospital Stay: Payer: BC Managed Care – PPO

## 2019-11-20 ENCOUNTER — Other Ambulatory Visit: Payer: Self-pay | Admitting: Hematology and Oncology

## 2019-11-20 ENCOUNTER — Other Ambulatory Visit: Payer: Self-pay

## 2019-11-20 VITALS — BP 100/66 | HR 80 | Temp 97.4°F | Resp 16

## 2019-11-20 DIAGNOSIS — Z7984 Long term (current) use of oral hypoglycemic drugs: Secondary | ICD-10-CM | POA: Diagnosis not present

## 2019-11-20 DIAGNOSIS — E538 Deficiency of other specified B group vitamins: Secondary | ICD-10-CM | POA: Insufficient documentation

## 2019-11-20 DIAGNOSIS — C50919 Malignant neoplasm of unspecified site of unspecified female breast: Secondary | ICD-10-CM

## 2019-11-20 DIAGNOSIS — E785 Hyperlipidemia, unspecified: Secondary | ICD-10-CM | POA: Insufficient documentation

## 2019-11-20 DIAGNOSIS — Z5111 Encounter for antineoplastic chemotherapy: Secondary | ICD-10-CM | POA: Diagnosis not present

## 2019-11-20 DIAGNOSIS — M25512 Pain in left shoulder: Secondary | ICD-10-CM | POA: Diagnosis not present

## 2019-11-20 DIAGNOSIS — G8929 Other chronic pain: Secondary | ICD-10-CM | POA: Diagnosis not present

## 2019-11-20 DIAGNOSIS — K5900 Constipation, unspecified: Secondary | ICD-10-CM | POA: Diagnosis not present

## 2019-11-20 DIAGNOSIS — F319 Bipolar disorder, unspecified: Secondary | ICD-10-CM | POA: Insufficient documentation

## 2019-11-20 DIAGNOSIS — Z791 Long term (current) use of non-steroidal anti-inflammatories (NSAID): Secondary | ICD-10-CM | POA: Diagnosis not present

## 2019-11-20 DIAGNOSIS — K219 Gastro-esophageal reflux disease without esophagitis: Secondary | ICD-10-CM | POA: Diagnosis not present

## 2019-11-20 DIAGNOSIS — C50511 Malignant neoplasm of lower-outer quadrant of right female breast: Secondary | ICD-10-CM | POA: Diagnosis present

## 2019-11-20 DIAGNOSIS — F419 Anxiety disorder, unspecified: Secondary | ICD-10-CM | POA: Insufficient documentation

## 2019-11-20 DIAGNOSIS — K649 Unspecified hemorrhoids: Secondary | ICD-10-CM | POA: Insufficient documentation

## 2019-11-20 DIAGNOSIS — I951 Orthostatic hypotension: Secondary | ICD-10-CM | POA: Diagnosis not present

## 2019-11-20 DIAGNOSIS — Z79899 Other long term (current) drug therapy: Secondary | ICD-10-CM | POA: Insufficient documentation

## 2019-11-20 DIAGNOSIS — E119 Type 2 diabetes mellitus without complications: Secondary | ICD-10-CM | POA: Insufficient documentation

## 2019-11-20 DIAGNOSIS — E860 Dehydration: Secondary | ICD-10-CM | POA: Insufficient documentation

## 2019-11-20 DIAGNOSIS — D509 Iron deficiency anemia, unspecified: Secondary | ICD-10-CM | POA: Insufficient documentation

## 2019-11-20 LAB — CBC WITH DIFFERENTIAL/PLATELET
Abs Immature Granulocytes: 0.02 10*3/uL (ref 0.00–0.07)
Basophils Absolute: 0 10*3/uL (ref 0.0–0.1)
Basophils Relative: 1 %
Eosinophils Absolute: 0.1 10*3/uL (ref 0.0–0.5)
Eosinophils Relative: 2 %
HCT: 30.2 % — ABNORMAL LOW (ref 36.0–46.0)
Hemoglobin: 9.9 g/dL — ABNORMAL LOW (ref 12.0–15.0)
Immature Granulocytes: 1 %
Lymphocytes Relative: 45 %
Lymphs Abs: 1.6 10*3/uL (ref 0.7–4.0)
MCH: 30 pg (ref 26.0–34.0)
MCHC: 32.8 g/dL (ref 30.0–36.0)
MCV: 91.5 fL (ref 80.0–100.0)
Monocytes Absolute: 0.2 10*3/uL (ref 0.1–1.0)
Monocytes Relative: 5 %
Neutro Abs: 1.7 10*3/uL (ref 1.7–7.7)
Neutrophils Relative %: 46 %
Platelets: 266 10*3/uL (ref 150–400)
RBC: 3.3 MIL/uL — ABNORMAL LOW (ref 3.87–5.11)
RDW: 17.7 % — ABNORMAL HIGH (ref 11.5–15.5)
WBC: 3.6 10*3/uL — ABNORMAL LOW (ref 4.0–10.5)
nRBC: 0 % (ref 0.0–0.2)

## 2019-11-20 MED ORDER — TBO-FILGRASTIM 300 MCG/0.5ML ~~LOC~~ SOSY
300.0000 ug | PREFILLED_SYRINGE | Freq: Once | SUBCUTANEOUS | Status: AC
  Administered 2019-11-20: 300 ug via SUBCUTANEOUS

## 2019-11-20 MED ORDER — SODIUM CHLORIDE 0.9% FLUSH
10.0000 mL | Freq: Once | INTRAVENOUS | Status: AC | PRN
  Administered 2019-11-20: 10 mL
  Filled 2019-11-20: qty 10

## 2019-11-20 MED ORDER — SODIUM CHLORIDE 0.9 % IV SOLN
Freq: Once | INTRAVENOUS | Status: AC
  Filled 2019-11-20: qty 250

## 2019-11-20 MED ORDER — HEPARIN SOD (PORK) LOCK FLUSH 100 UNIT/ML IV SOLN
500.0000 [IU] | Freq: Once | INTRAVENOUS | Status: AC | PRN
  Administered 2019-11-20: 500 [IU]
  Filled 2019-11-20: qty 5

## 2019-11-20 NOTE — Progress Notes (Signed)
ANC 1.7 per Dr Terance Hart today

## 2019-11-20 NOTE — Patient Instructions (Signed)
Tbo-Filgrastim injection What is this medicine? TBO-FILGRASTIM (T B O fil GRA stim) is a granulocyte colony-stimulating factor that helps you make more neutrophils, a type of white blood cell. Neutrophils are important for fighting infections. Some chemotherapy affects your bone marrow and lowers your neutrophils. This medicine helps decrease the length of time that neutrophils are very low (severe neutropenia). This medicine may be used for other purposes; ask your health care provider or pharmacist if you have questions. COMMON BRAND NAME(S): Granix What should I tell my health care provider before I take this medicine? They need to know if you have any of these conditions:  bone scan or tests planned  kidney disease  sickle cell anemia  an unusual or allergic reaction to tbo-filgrastim, filgrastim, pegfilgrastim, other medicines, foods, dyes, or preservatives  pregnant or trying to get pregnant  breast-feeding How should I use this medicine? This medicine is for injection under the skin. If you get this medicine at home, you will be taught how to prepare and give this medicine. Refer to the Instructions for Use that come with your medication packaging. Use exactly as directed. Take your medicine at regular intervals. Do not take your medicine more often than directed. It is important that you put your used needles and syringes in a special sharps container. Do not put them in a trash can. If you do not have a sharps container, call your pharmacist or healthcare provider to get one. Talk to your pediatrician regarding the use of this medicine in children. While this drug may be prescribed for children as young as 1 month of age for selected conditions, precautions do apply. Overdosage: If you think you have taken too much of this medicine contact a poison control center or emergency room at once. NOTE: This medicine is only for you. Do not share this medicine with others. What if I miss a  dose? It is important not to miss your dose. Call your doctor or health care professional if you miss a dose. What may interact with this medicine? This medicine may interact with the following medications:  medicines that may cause a release of neutrophils, such as lithium This list may not describe all possible interactions. Give your health care provider a list of all the medicines, herbs, non-prescription drugs, or dietary supplements you use. Also tell them if you smoke, drink alcohol, or use illegal drugs. Some items may interact with your medicine. What should I watch for while using this medicine? You may need blood work done while you are taking this medicine. What side effects may I notice from receiving this medicine? Side effects that you should report to your doctor or health care professional as soon as possible:  allergic reactions like skin rash, itching or hives, swelling of the face, lips, or tongue  back pain  blood in the urine  dark urine  dizziness  fast heartbeat  feeling faint  shortness of breath or breathing problems  signs and symptoms of infection like fever or chills; cough; or sore throat  signs and symptoms of kidney injury like trouble passing urine or change in the amount of urine  stomach or side pain, or pain at the shoulder  sweating  swelling of the legs, ankles, or abdomen  tiredness Side effects that usually do not require medical attention (report to your doctor or health care professional if they continue or are bothersome):  bone pain  diarrhea  headache  muscle pain  vomiting This list may   not describe all possible side effects. Call your doctor for medical advice about side effects. You may report side effects to FDA at 1-800-FDA-1088. Where should I keep my medicine? Keep out of the reach of children. Store in a refrigerator between 2 and 8 degrees C (36 and 46 degrees F). Keep in carton to protect from light. Throw  away this medicine if it is left out of the refrigerator for more than 5 consecutive days. Throw away any unused medicine after the expiration date. NOTE: This sheet is a summary. It may not cover all possible information. If you have questions about this medicine, talk to your doctor, pharmacist, or health care provider.  2020 Elsevier/Gold Standard (2018-06-07 19:58:39)  

## 2019-11-23 NOTE — Progress Notes (Signed)
[facility]  [address], [address] [place], [place] [address] Phone: [phone]  Fax: [phone]   Clinic Day:  11/24/2019  Referring physician: [facility], [place]  Chief Complaint: Heather Bell is a 44 y.o. female with clinical stage IIBtriple negativerightbreast cancer who is seen for assessment prior to week #12 Taxol and carboplatin.   HPI: The patient was last seen in the medical oncology clinic on 11/10/2019. At that time, she felt "ok".  She had lost 3 pounds.  Fluid intake remained marginal.  She had ongoing left shoulder pain with decreased ROM.Marland Kitchen Hematocrit was 31.9, hemoglobin 10.3, platelets 312,000, WBC 4,100. Potassium was 3.4. Magnesium 1.9. Pregnancy urine test was negative. Potassium was increased x 3 days.  She received IVF.  She received week #10 Taxol and carboplatin.  Patient was seen by Dr. Windell Moment on 11/11/2019 for chronic left shoulder pain. He did not think pain was related to her port-a-cath. He considered pain could being coming from joint arthritis, tendinitis, or rotator cuff. He discussed a referral to orthopaedics surgeon for evaluation.   Patient reported to the clinic on 11/16/2019 that she had pain with redness on her anal area x 1 week. Patient was advised to call her PCP.  Patient was seen by her PCP Dr. Mare Loan on 11/16/2019. She was constipated for a couple of weeks. Patient was diagnosed with hemorrhoids. Patient was given a prescription for suppositories.   She received week #11 Taxol + carboplatin on 11/17/2019.  During infusion she was feeling" fatigued".  She noted bouts of dizziness over the weekend. She had abdominal soreness/pain that started that morning in the mid abdomen (2-3/10).  She denied nausea.  Bowel movement on 11/15/2019 was " kinda mushy" texture.  ANC was 1500. Patient wished to receive chemotherapy.   She received IVF and Granix on 11/20/2019.   Labs followed: 11/13/2019: Hematocrit 28.9,  hemoglobin 9.5, platelets 281,000, WBC 3,800.  11/17/2019: Hematocrit 30.9, hemoglobin 10.2, platelets 292,000, WBC 4,000. 11/20/2019: Hematocrit 30.2, hemoglobin 9.9, platelets 266,000, WBC 3,600.   During the interim, she has been feeling "ok". She notes taking nausea medication before her last chemotherapy. She tolerated Granix well. She denies any nausea, vomiting and diarrhea this past week. She continues to struggle with drinking fluids. She reports taking suppositories and MiraLax for her hemorrhoids. She is on oral potassium 1 pill per day. She denies feeling lightheaded or dizzy. She would like to continue receiving IV fluids.   She will be seen by Dr. Sanjuana Mae in orthopedics tomorrow. Her shoulder pain is unchanged. She can barely feel her breast lump.    Past Medical History:  Diagnosis Date  . Allergic rhinitis   . Anxiety   . Bipolar disorder ([place])    depression  . Cancer [facility])    breast right  . Depression   . Diabetes ([place])   . GERD (gastroesophageal reflux disease)   . Hyperlipidemia     Past Surgical History:  Procedure Laterality Date  . BREAST BIOPSY Right 07/22/2019   mass bx, path pending, heart marker  . BREAST BIOPSY Right 07/22/2019   LN bx, path pending,  butterfly hydromarker  . IMAGE GUIDED SINUS SURGERY    . KNEE SURGERY    . NASAL SEPTUM SURGERY    . PORTACATH PLACEMENT N/A 08/07/2019   Procedure: INSERTION PORT-A-CATH;  Surgeon: Herbert Pun, MD;  Location: [facility] ORS;  Service: General;  Laterality: N/A;  . TONSILLECTOMY      Family History  Problem Relation Age of Onset  . Lung cancer Father   . Heart attack Father   . Lung cancer Paternal Uncle   . Heart disease Paternal Uncle   . Lung cancer Paternal Aunt     Social History:  reports that she has never smoked. She has never used smokeless tobacco. She reports that she does not drink alcohol or use drugs. Deniesany exposure to radiation or toxins.She has 2 children (65  year old daughter and 20 year old son).She use to work inthefast foodindustry. She is currently not working. Her husbandisMichael.[name] travels a lot for his job. Her husband found a new job closer to home in [place], [place]. The patient is alone today.  Allergies:  Allergies  Allergen Reactions  . Compazine [Prochlorperazine Edisylate] Other (See Comments)    Acute dystonic reaction  . Morphine And Related Shortness Of Breath  . Doxycycline Other (See Comments)    "made all my symptoms worse"   . Fluticasone Other (See Comments)    Doesn't work  . Levofloxacin Other (See Comments)    "made all my symptoms worse"     Current Medications: Current Outpatient Medications  Medication Sig Dispense Refill  . amphetamine-dextroamphetamine (ADDERALL XR) 25 MG 24 hr capsule Take 25 mg by mouth daily.    . Calcium Carbonate-Vitamin D (CALCIUM 500 + D) 500-125 MG-UNIT TABS Take 1 tablet by mouth daily.    . cetirizine (ZYRTEC) 10 MG tablet Take 10 mg by mouth daily.     . clonazePAM (KLONOPIN) 1 MG tablet Take 1 mg by mouth 3 (three) times daily as needed for anxiety.    . docusate sodium (COLACE) 100 MG capsule Take 100 mg by mouth 2 (two) times daily.    Marland Kitchen doxepin (SINEQUAN) 25 MG capsule Take 25 mg by mouth at bedtime.     Marland Kitchen ibuprofen (ADVIL) 200 MG tablet Take 400-800 mg by mouth every 6 (six) hours as needed for headache or moderate pain.    Marland Kitchen lamoTRIgine (LAMICTAL) 200 MG tablet Take 200 mg by mouth at bedtime.     . lidocaine-prilocaine (EMLA) cream Apply topically over port-a-cath 30 minutes to 1 hour prior to treatment. 30 g 2  . LORazepam (ATIVAN) 0.5 MG tablet Take 1 tablet (0.5 mg total) by mouth every 6 (six) hours as needed (Nausea or vomiting). 20 tablet 0  . lurasidone (LATUDA) 40 MG TABS tablet Take 40 mg by mouth at bedtime. Take with 60 mg to equal 100 mg at night    . Lurasidone HCl 60 MG TABS Take 60 mg by mouth at bedtime. Take with 40 mg to equal 100 mg at night    .  metFORMIN (GLUCOPHAGE-XR) 500 MG 24 hr tablet Take 1,000 mg by mouth 2 (two) times daily.    . mometasone (NASONEX) 50 MCG/ACT nasal spray Place 2 sprays into the nose daily as needed (allergies).     . Multiple Vitamin (MULTIVITAMIN WITH MINERALS) TABS tablet Take 1 tablet by mouth daily.    Marland Kitchen omeprazole (PRILOSEC) 20 MG capsule Take 20 mg by mouth daily.     . potassium chloride (KLOR-CON) 10 MEQ tablet Take 1 tablet (10 mEq total) by mouth as directed. Take 1 tablet daily as directed by MD. 30 tablet 0  . QUEtiapine (SEROQUEL XR) 300 MG 24 hr tablet Take 300 mg by mouth at bedtime.    . simvastatin (ZOCOR) 20 MG tablet Take 20 mg by mouth daily.    Marland Kitchen topiramate (TOPAMAX)  100 MG tablet Take 50-100 mg by mouth See admin instructions. Take 100 mg in the morning and 50 mg at night    . vitamin B-12 (CYANOCOBALAMIN) 1000 MCG tablet Take 1,000 mcg by mouth daily.    . ondansetron (ZOFRAN) 8 MG tablet Take 1 tablet (8 mg total) by mouth 2 (two) times daily as needed for refractory nausea / vomiting. Start on day 3 after carboplatin chemo. (Patient not taking: Reported on 10/12/2019) 30 tablet 1   No current facility-administered medications for this visit.   Facility-Administered Medications Ordered in Other Visits  Medication Dose Route Frequency Provider Last Rate Last Admin  . heparin lock flush 100 unit/mL  500 Units Intravenous Once Doreena Maulden C, MD      . sodium chloride flush (NS) 0.9 % injection 10 mL  10 mL Intravenous PRN Lequita Asal, MD   10 mL at 11/24/19 0850    Review of Systems  Constitutional: Negative.  Negative for chills, diaphoresis, fever, malaise/fatigue and weight loss (up 4 lbs).       Feeling "ok".  HENT: Negative.  Negative for congestion, ear discharge, ear pain, hearing loss, nosebleeds, sinus pain and sore throat.   Eyes: Negative.  Negative for blurred vision, double vision and photophobia.  Respiratory: Negative.  Negative for cough, hemoptysis, sputum  production and shortness of breath.   Cardiovascular: Negative.  Negative for chest pain, palpitations and leg swelling.  Gastrointestinal: Negative for abdominal pain, blood in stool, constipation (x 1 week; on MiraLax), diarrhea, heartburn (reflux; on antacids; better), melena, nausea and vomiting.       Struggles to drink fluids.  Genitourinary: Negative.  Negative for dysuria, frequency, hematuria and urgency.       Premenopausal.  Musculoskeletal: Positive for joint pain (left shoulder). Negative for back pain, myalgias and neck pain.       LEFT shoulder decreased ROM.  Skin: Negative for itching and rash.       RIGHT breast lump barely palpable.  Neurological: Negative.  Negative for dizziness, tingling, sensory change, focal weakness, weakness and headaches.  Endo/Heme/Allergies: Negative.  Does not bruise/bleed easily.       Type II diabetes.  Psychiatric/Behavioral: Negative.  Negative for depression and memory loss. The patient is not nervous/anxious and does not have insomnia.   All other systems reviewed and are negative.  Performance status (ECOG): 1  Vitals Temperature 98.6 F (37 C), temperature source Tympanic, resp. rate 18, height '5\' 6"'  (1.676 m), weight 138 lb 9 oz (62.9 kg), SpO2 100 %.   Physical Exam  Constitutional: She is oriented to person, place, and time. She appears well-developed and well-nourished. No distress.  HENT:  Head: Normocephalic and atraumatic.  Mouth/Throat: Oropharynx is clear and moist. No oropharyngeal exudate.  Pink head wrap. Mask.  Eyes: Pupils are equal, round, and reactive to light. Conjunctivae and EOM are normal. No scleral icterus.  Brown eyes.  Neck: No JVD present.  Cardiovascular: Normal rate, regular rhythm and normal heart sounds.  No murmur heard. Pulmonary/Chest: Effort normal and breath sounds normal. No respiratory distress. She has no wheezes. She has no rales. She exhibits no tenderness.  Abdominal: Soft. Bowel sounds  are normal. She exhibits no distension and no mass. There is no abdominal tenderness. There is no rebound and no guarding.  Musculoskeletal:        General: No tenderness or edema.     Cervical back: Normal range of motion and neck supple.  Lymphadenopathy:  Head (right side): No preauricular, no posterior auricular and no occipital adenopathy present.       Head (left side): No preauricular, no posterior auricular and no occipital adenopathy present.    She has no cervical adenopathy.    She has no axillary adenopathy.       Right: No inguinal and no supraclavicular adenopathy present.       Left: No inguinal and no supraclavicular adenopathy present.  Neurological: She is alert and oriented to person, place, and time.  Skin: Skin is warm and dry. No rash noted. She is not diaphoretic. No erythema. No pallor.  Psychiatric: She has a normal mood and affect. Her behavior is normal. Judgment and thought content normal.  Nursing note and vitals reviewed.   Infusion on 11/24/2019  Component Date Value Ref Range Status  . Magnesium 11/24/2019 1.8  1.7 - 2.4 mg/dL Final   Performed at [facility], [address] [place], [place] [address]  . Sodium 11/24/2019 139  135 - 145 mmol/L Final  . Potassium 11/24/2019 3.8  3.5 - 5.1 mmol/L Final  . Chloride 11/24/2019 108  98 - 111 mmol/L Final  . CO2 11/24/2019 23  22 - 32 mmol/L Final  . Glucose, Bld 11/24/2019 110* 70 - 99 mg/dL Final   Glucose reference range applies only to samples taken after fasting for at least 8 hours.  . BUN 11/24/2019 12  6 - 20 mg/dL Final  . Creatinine, Ser 11/24/2019 0.59  0.44 - 1.00 mg/dL Final  . Calcium 11/24/2019 9.1  8.9 - 10.3 mg/dL Final  . Total Protein 11/24/2019 6.8  6.5 - 8.1 g/dL Final  . Albumin 11/24/2019 4.1  3.5 - 5.0 g/dL Final  . AST 11/24/2019 17  15 - 41 U/L Final  . ALT 11/24/2019 18  0 - 44 U/L Final  . Alkaline Phosphatase 11/24/2019 48  38 - 126 U/L Final  . Total  Bilirubin 11/24/2019 0.3  0.3 - 1.2 mg/dL Final  . GFR calc non Af Amer 11/24/2019 >60  >60 mL/min Final  . GFR calc Af Amer 11/24/2019 >60  >60 mL/min Final  . Anion gap 11/24/2019 8  5 - 15 Final   Performed at [facility], [address], [place],  [address]  . WBC 11/24/2019 7.7  4.0 - 10.5 K/uL Final  . RBC 11/24/2019 3.28* 3.87 - 5.11 MIL/uL Final  . Hemoglobin 11/24/2019 10.0* 12.0 - 15.0 g/dL Final  . HCT 11/24/2019 30.4* 36.0 - 46.0 % Final  . MCV 11/24/2019 92.7  80.0 - 100.0 fL Final  . MCH 11/24/2019 30.5  26.0 - 34.0 pg Final  . MCHC 11/24/2019 32.9  30.0 - 36.0 g/dL Final  . RDW 11/24/2019 18.3* 11.5 - 15.5 % Final  . Platelets 11/24/2019 275  150 - 400 K/uL Final  . nRBC 11/24/2019 0.0  0.0 - 0.2 % Final  . Neutrophils Relative % 11/24/2019 38  % Final  . Neutro Abs 11/24/2019 2.9  1.7 - 7.7 K/uL Final  . Lymphocytes Relative 11/24/2019 42  % Final  . Lymphs Abs 11/24/2019 3.2  0.7 - 4.0 K/uL Final  . Monocytes Relative 11/24/2019 8  % Final  . Monocytes Absolute 11/24/2019 0.6  0.1 - 1.0 K/uL Final  . Eosinophils Relative 11/24/2019 1  % Final  . Eosinophils Absolute 11/24/2019 0.1  0.0 - 0.5 K/uL Final  . Basophils Relative 11/24/2019 1  % Final  . Basophils Absolute 11/24/2019  0.1  0.0 - 0.1 K/uL Final  . Immature Granulocytes 11/24/2019 10  % Final  . Abs Immature Granulocytes 11/24/2019 0.77* 0.00 - 0.07 K/uL Final   Performed at [facility], [address] [place], [place] [address]  . Preg Test, Ur 11/24/2019 NEGATIVE  NEGATIVE Final   Performed at [facility], [address] [place], [place] [address]    Assessment:  Heather Bell is a 44 y.o. female with clinical T1cN1triple negativerightbreast cancers/p ultrasound guided core biopsy. Pathology revealeda grade IIIinvasive mammary carcinoma, no special type of the right breast. Specimen was 10 mm.Ductal carcinoma in situ(DCIS)was not identified.Right  axillarylymph nodewas positive for metastatic carcinoma.Tumor was ER - (<1%), PR - (<1%), and Her2/neu -.  Bilateral mammogram and right unilateral ultrasoundon 07/10/2019 revealed a 1.7 x 1.6 x 1.6 cmmass at the7 o'clock region of the right breast 7 cm from the nipple. There was an abnormal1.4 x 0.7 x 0.9 cmaxillary lymph node. Therewas a second3 mmaxillary lymph node with minimal focal cortical thickening.An ultrasound-guided core biopsies of the mass and the enlarged right axillary lymph node was recommended. BI-RADS category 5.  PET scanon 08/11/2019 showed a hypermetabolic mass in the inferior RIGHT breast consistentwith herbreast carcinoma. There was noevidence hypermetabolic nodal metastasis.There was no distant metastatic disease.There was nopulmonary metastasis or skeletal metastasis.  Echoon 12/24/2020revealedan ejection fraction of50 to 55%. The left ventricle demonstrated global hypokinesis.MUGAon 08/24/2019 revealed an EF of 60%.She is followed by Dr End.  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  She is s/p week #11of Taxol + carboplatin(08/25/2019 - 11/17/2019).Carboplatin was held on week #7 and 8 secondary to marginal counts. She received Granix on 10/28/2019 and 11/20/2019.  Breast masswas larger after initiation of treatment felt secondary to post biopsy hematoma. Right breast ultrasoundon 09/07/2019 revealed the mass in the 7 o'clock position contained cystic components and measured 3 cm (compared to 1.7 cm on 07/10/2019). The biopsied lymph node with 3 mm cortical thickening in the axilla was similar. Right breast ultrasoundon 10/09/2019 revealed interval decrease in size of RIGHT breast mass (1.4 x 1.7 x 1.4 cm). Mass was less well defined and associated with parenchymal edema.   She is premenopausal. She stopped taking  birth control pills on 08/17/2019.  She has iron deficiency. Ferritin was 21 with an iron saturation of 7% and a TIBC 343 on 09/08/2019.She received Venoferweekly x 3 (09/18/2019 - 10/06/2019).  She has B12 deficiency. B12 was 240 on 01/19/2021and 680 on 11/24/2019.She is on oral B12.  She has recurrent orthostatic hypotension. Fluid intake is marginal. AM cortisol was 5.3 (low) on 09/22/2019 suggestive of adrenal insufficiency.ACTH stim test was negative.  Symptomatically, she is doing well.  Breast lump is barely palpable.  Plan: 1.   Labs today: CBC with diff, CMP, Mg, ferritin, iron studies, B12, urine pregnancy test. 2.Clinical stageIIB triple negativeright breast cancer PET scan- no evidence of metastatic disease. Patient undergoingneoadjuvant chemotherapy. Weekly Taxol + carboplatin x 12 followed by AC x 4 with Neulasta support. She is s/p week #11Taxol and carboplatin. She continues to tolerate treatment well. She denies any neuropathy. She last required Granix on 11/20/2019. Breast mass is indistinct. Discuss plan follow-up ultrasound after 12 weeks of Taxol prior to [facility].             Labs reviewed.  Week #12 Taxol plus carboplatin.  Discuss symptom management.  She has antiemetics at home to  use on a prn bases.  Interventions are adequate.     Discuss plan for 4 cycles of AC every 2 weeks with Neulasta support beginning in 3 weeks.   Discuss plan for repeat MUGA and follow-up with Dr. Saunders Revel.  After completion of chemotherapy, review plan for surgery. 3. Iron deficiency anemia Hematocrit 30.4, hemoglobin 10.0, MCV 92.7 today. Ferritin was 89 today.             Shereceived weekly Venofer x 3 (last 10/06/2019). Suspect chemotherapy-induced  anemia.             Anticipate improvement in counts with initiation of AC (time off chemotherapy). 4. B12 deficiency B12 was 240 on 01/19/2021and 680 today. Check B12 at next visit 5.Dehydration             IVF 1 liter today.    RTC on 04/09 for IVF.    RTC on Tuesdays and Fridays for IVF until next cycle of chemotherapy. 6.   Left shoulder pain             Etiology unclear.             Patient has limited range of motion.             Patient has appointment with orthopedics. 7.Week #12 Taxol + carboplatin today. 8.   Schedule right breast ultrasound. 9.   Schedule MUGA scan. 10.   After MUGA, please schedule f/u with Dr End. 11.   RTC in 3 weeks for MD assessment, labs (CBC with diff, CMP, Mg), and cycle #1 AC.  I discussed the assessment and treatment plan with the patient.  The patient was provided an opportunity to ask questions and all were answered.  The patient agreed with the plan and demonstrated an understanding of the instructions.  The patient was advised to call back if the symptoms worsen or if the condition fails to improve as anticipated.    Lequita Asal, MD, PhD    11/24/2019, 10:05 AM  I, Selena Batten, am acting as scribe for [employer]. Mike Gip, MD, PhD.  I, Milaya Hora C. Mike Gip, MD, have reviewed the above documentation for accuracy and completeness, and I agree with the above.

## 2019-11-24 ENCOUNTER — Inpatient Hospital Stay: Payer: BC Managed Care – PPO

## 2019-11-24 ENCOUNTER — Encounter: Payer: Self-pay | Admitting: Hematology and Oncology

## 2019-11-24 ENCOUNTER — Inpatient Hospital Stay (HOSPITAL_BASED_OUTPATIENT_CLINIC_OR_DEPARTMENT_OTHER): Payer: BC Managed Care – PPO | Admitting: Hematology and Oncology

## 2019-11-24 ENCOUNTER — Other Ambulatory Visit: Payer: Self-pay

## 2019-11-24 VITALS — BP 107/70 | HR 97 | Temp 97.0°F | Resp 18

## 2019-11-24 VITALS — BP 97/62 | HR 84 | Temp 98.6°F | Resp 18 | Ht 66.0 in | Wt 138.6 lb

## 2019-11-24 DIAGNOSIS — C50511 Malignant neoplasm of lower-outer quadrant of right female breast: Secondary | ICD-10-CM

## 2019-11-24 DIAGNOSIS — D509 Iron deficiency anemia, unspecified: Secondary | ICD-10-CM | POA: Diagnosis not present

## 2019-11-24 DIAGNOSIS — E860 Dehydration: Secondary | ICD-10-CM | POA: Diagnosis not present

## 2019-11-24 DIAGNOSIS — Z5111 Encounter for antineoplastic chemotherapy: Secondary | ICD-10-CM

## 2019-11-24 DIAGNOSIS — M25512 Pain in left shoulder: Secondary | ICD-10-CM

## 2019-11-24 DIAGNOSIS — E538 Deficiency of other specified B group vitamins: Secondary | ICD-10-CM

## 2019-11-24 DIAGNOSIS — C50919 Malignant neoplasm of unspecified site of unspecified female breast: Secondary | ICD-10-CM

## 2019-11-24 DIAGNOSIS — Z7189 Other specified counseling: Secondary | ICD-10-CM

## 2019-11-24 DIAGNOSIS — Z171 Estrogen receptor negative status [ER-]: Secondary | ICD-10-CM

## 2019-11-24 LAB — COMPREHENSIVE METABOLIC PANEL
ALT: 18 U/L (ref 0–44)
AST: 17 U/L (ref 15–41)
Albumin: 4.1 g/dL (ref 3.5–5.0)
Alkaline Phosphatase: 48 U/L (ref 38–126)
Anion gap: 8 (ref 5–15)
BUN: 12 mg/dL (ref 6–20)
CO2: 23 mmol/L (ref 22–32)
Calcium: 9.1 mg/dL (ref 8.9–10.3)
Chloride: 108 mmol/L (ref 98–111)
Creatinine, Ser: 0.59 mg/dL (ref 0.44–1.00)
GFR calc Af Amer: >60 mL/min (ref >60)
GFR calc non Af Amer: >60 mL/min (ref >60)
Glucose, Bld: 110 mg/dL — ABNORMAL HIGH (ref 70–99)
Potassium: 3.8 mmol/L (ref 3.5–5.1)
Sodium: 139 mmol/L (ref 135–145)
Total Bilirubin: 0.3 mg/dL (ref 0.3–1.2)
Total Protein: 6.8 g/dL (ref 6.5–8.1)

## 2019-11-24 LAB — CBC WITH DIFFERENTIAL/PLATELET
Abs Immature Granulocytes: 0.77 10*3/uL — ABNORMAL HIGH (ref 0.00–0.07)
Basophils Absolute: 0.1 10*3/uL (ref 0.0–0.1)
Basophils Relative: 1 %
Eosinophils Absolute: 0.1 10*3/uL (ref 0.0–0.5)
Eosinophils Relative: 1 %
HCT: 30.4 % — ABNORMAL LOW (ref 36.0–46.0)
Hemoglobin: 10 g/dL — ABNORMAL LOW (ref 12.0–15.0)
Immature Granulocytes: 10 %
Lymphocytes Relative: 42 %
Lymphs Abs: 3.2 10*3/uL (ref 0.7–4.0)
MCH: 30.5 pg (ref 26.0–34.0)
MCHC: 32.9 g/dL (ref 30.0–36.0)
MCV: 92.7 fL (ref 80.0–100.0)
Monocytes Absolute: 0.6 10*3/uL (ref 0.1–1.0)
Monocytes Relative: 8 %
Neutro Abs: 2.9 10*3/uL (ref 1.7–7.7)
Neutrophils Relative %: 38 %
Platelets: 275 10*3/uL (ref 150–400)
RBC: 3.28 MIL/uL — ABNORMAL LOW (ref 3.87–5.11)
RDW: 18.3 % — ABNORMAL HIGH (ref 11.5–15.5)
WBC: 7.7 10*3/uL (ref 4.0–10.5)
nRBC: 0 % (ref 0.0–0.2)

## 2019-11-24 LAB — IRON AND TIBC
Iron: 62 ug/dL (ref 28–170)
Saturation Ratios: 20 % (ref 10.4–31.8)
TIBC: 309 ug/dL (ref 250–450)
UIBC: 247 ug/dL

## 2019-11-24 LAB — MAGNESIUM: Magnesium: 1.8 mg/dL (ref 1.7–2.4)

## 2019-11-24 LAB — PREGNANCY, URINE: Preg Test, Ur: NEGATIVE

## 2019-11-24 LAB — VITAMIN B12: Vitamin B-12: 680 pg/mL (ref 180–914)

## 2019-11-24 LAB — FERRITIN: Ferritin: 89 ng/mL (ref 11–307)

## 2019-11-24 MED ORDER — SODIUM CHLORIDE 0.9 % IV SOLN
Freq: Once | INTRAVENOUS | Status: AC
  Filled 2019-11-24: qty 250

## 2019-11-24 MED ORDER — SODIUM CHLORIDE 0.9 % IV SOLN
166.2000 mg | Freq: Once | INTRAVENOUS | Status: AC
  Administered 2019-11-24: 170 mg via INTRAVENOUS
  Filled 2019-11-24: qty 17

## 2019-11-24 MED ORDER — PALONOSETRON HCL INJECTION 0.25 MG/5ML
0.2500 mg | Freq: Once | INTRAVENOUS | Status: AC
  Administered 2019-11-24: 0.25 mg via INTRAVENOUS

## 2019-11-24 MED ORDER — SODIUM CHLORIDE 0.9% FLUSH
10.0000 mL | INTRAVENOUS | Status: DC | PRN
  Administered 2019-11-24: 10 mL via INTRAVENOUS
  Filled 2019-11-24: qty 10

## 2019-11-24 MED ORDER — FAMOTIDINE IN NACL 20-0.9 MG/50ML-% IV SOLN
20.0000 mg | Freq: Once | INTRAVENOUS | Status: AC
  Administered 2019-11-24: 20 mg via INTRAVENOUS

## 2019-11-24 MED ORDER — HEPARIN SOD (PORK) LOCK FLUSH 100 UNIT/ML IV SOLN
500.0000 [IU] | Freq: Once | INTRAVENOUS | Status: AC
  Administered 2019-11-24: 16:00:00 500 [IU] via INTRAVENOUS
  Filled 2019-11-24: qty 5

## 2019-11-24 MED ORDER — DIPHENHYDRAMINE HCL 50 MG/ML IJ SOLN
50.0000 mg | Freq: Once | INTRAMUSCULAR | Status: AC
  Administered 2019-11-24: 11:00:00 50 mg via INTRAVENOUS

## 2019-11-24 MED ORDER — SODIUM CHLORIDE 0.9 % IV SOLN
80.0000 mg/m2 | Freq: Once | INTRAVENOUS | Status: AC
  Administered 2019-11-24: 12:00:00 132 mg via INTRAVENOUS
  Filled 2019-11-24: qty 22

## 2019-11-24 MED ORDER — SODIUM CHLORIDE 0.9 % IV SOLN
10.0000 mg | Freq: Once | INTRAVENOUS | Status: AC
  Administered 2019-11-24: 11:00:00 10 mg via INTRAVENOUS
  Filled 2019-11-24: qty 10

## 2019-11-24 NOTE — Progress Notes (Signed)
No new c/o noted today. 

## 2019-11-27 ENCOUNTER — Other Ambulatory Visit: Payer: Self-pay

## 2019-11-27 ENCOUNTER — Inpatient Hospital Stay: Payer: BC Managed Care – PPO

## 2019-11-27 VITALS — BP 106/70 | HR 97 | Temp 98.8°F | Resp 18

## 2019-11-27 DIAGNOSIS — C50511 Malignant neoplasm of lower-outer quadrant of right female breast: Secondary | ICD-10-CM | POA: Diagnosis not present

## 2019-11-27 DIAGNOSIS — D509 Iron deficiency anemia, unspecified: Secondary | ICD-10-CM

## 2019-11-27 MED ORDER — SODIUM CHLORIDE 0.9 % IV SOLN
Freq: Once | INTRAVENOUS | Status: AC
  Filled 2019-11-27: qty 250

## 2019-11-27 MED ORDER — HEPARIN SOD (PORK) LOCK FLUSH 100 UNIT/ML IV SOLN
500.0000 [IU] | Freq: Once | INTRAVENOUS | Status: AC | PRN
  Administered 2019-11-27: 500 [IU]
  Filled 2019-11-27: qty 5

## 2019-11-27 MED ORDER — SODIUM CHLORIDE 0.9% FLUSH
10.0000 mL | Freq: Once | INTRAVENOUS | Status: AC | PRN
  Administered 2019-11-27: 10 mL
  Filled 2019-11-27: qty 10

## 2019-11-27 NOTE — Progress Notes (Signed)
Patients father passed away this morning at home. He was under Hospice Care. Patient saw Ortho MD and thinks she has bursitis. Pain occurs only with ROM. Patient wanted to come for her IVF's this morning.

## 2019-12-01 ENCOUNTER — Inpatient Hospital Stay: Payer: BC Managed Care – PPO

## 2019-12-03 ENCOUNTER — Ambulatory Visit
Admission: RE | Admit: 2019-12-03 | Discharge: 2019-12-03 | Disposition: A | Discharge status: Home / Self Care | Payer: BC Managed Care – PPO | Source: Ambulatory Visit | Attending: Hematology and Oncology | Admitting: Hematology and Oncology

## 2019-12-03 DIAGNOSIS — C50511 Malignant neoplasm of lower-outer quadrant of right female breast: Secondary | ICD-10-CM

## 2019-12-03 DIAGNOSIS — Z171 Estrogen receptor negative status [ER-]: Secondary | ICD-10-CM | POA: Diagnosis present

## 2019-12-04 ENCOUNTER — Inpatient Hospital Stay: Payer: BC Managed Care – PPO

## 2019-12-08 ENCOUNTER — Ambulatory Visit
Admission: RE | Admit: 2019-12-08 | Discharge: 2019-12-08 | Disposition: A | Discharge status: Home / Self Care | Payer: BC Managed Care – PPO | Source: Ambulatory Visit | Attending: Hematology and Oncology | Admitting: Hematology and Oncology

## 2019-12-08 ENCOUNTER — Inpatient Hospital Stay: Payer: BC Managed Care – PPO

## 2019-12-08 ENCOUNTER — Other Ambulatory Visit: Payer: Self-pay

## 2019-12-08 DIAGNOSIS — C50511 Malignant neoplasm of lower-outer quadrant of right female breast: Secondary | ICD-10-CM | POA: Diagnosis not present

## 2019-12-08 DIAGNOSIS — Z171 Estrogen receptor negative status [ER-]: Secondary | ICD-10-CM | POA: Diagnosis not present

## 2019-12-08 MED ORDER — TECHNETIUM TC 99M-LABELED RED BLOOD CELLS IV KIT
20.0000 | PACK | Freq: Once | INTRAVENOUS | Status: AC | PRN
  Administered 2019-12-08: 11:00:00 21.62 via INTRAVENOUS

## 2019-12-08 NOTE — Progress Notes (Signed)
Pharmacist Chemotherapy Monitoring - Initial Assessment    Anticipated start date: 12/15/19  Regimen:  . Are orders appropriate based on the patient's diagnosis, regimen, and cycle? Yes . Does the plan date match the patient's scheduled date? Yes . Is the sequencing of drugs appropriate? Yes . Are the premedications appropriate for the patient's regimen? Yes . Prior Authorization for treatment is: Approved o If applicable, is the correct biosimilar selected based on the patient's insurance? not applicable  Organ Function and Labs: Marland Kitchen Are dose adjustments needed based on the patient's renal function, hepatic function, or hematologic function? Yes . Are appropriate labs ordered prior to the start of patient's treatment? Yes . Other organ system assessment, if indicated: anthracyclines: Echo/ MUGA . The following baseline labs, if indicated, have been ordered: N/A  Dose Assessment: . Are the drug doses appropriate? Yes . Are the following correct: o Drug concentrations Yes o IV fluid compatible with drug Yes o Administration routes Yes o Timing of therapy Yes . If applicable, does the patient have documented access for treatment and/or plans for port-a-cath placement? yes . If applicable, have lifetime cumulative doses been properly documented and assessed? yes  Toxicity Monitoring/Prevention: . The patient has the following take home antiemetics prescribed: Ondansetron and Lorazepam . The patient has the following take home medications prescribed: N/A . Medication allergies and previous infusion related reactions, if applicable, have been reviewed and addressed. Yes . The patient's current medication list has been assessed for drug-drug interactions with their chemotherapy regimen. no significant drug-drug interactions were identified on review.  Order Review: . Are the treatment plan orders signed? No . Is the patient scheduled to see a provider prior to their treatment? Yes  I  verify that I have reviewed each item in the above checklist and answered each question accordingly.  Adelina Mings 12/08/2019 11:13 AM

## 2019-12-11 ENCOUNTER — Other Ambulatory Visit: Payer: Self-pay

## 2019-12-11 ENCOUNTER — Ambulatory Visit (INDEPENDENT_AMBULATORY_CARE_PROVIDER_SITE_OTHER): Payer: BC Managed Care – PPO | Admitting: Internal Medicine

## 2019-12-11 ENCOUNTER — Encounter: Payer: Self-pay | Admitting: Internal Medicine

## 2019-12-11 ENCOUNTER — Inpatient Hospital Stay: Payer: BC Managed Care – PPO

## 2019-12-11 VITALS — BP 92/70 | HR 86 | Ht 66.0 in | Wt 135.5 lb

## 2019-12-11 DIAGNOSIS — C50919 Malignant neoplasm of unspecified site of unspecified female breast: Secondary | ICD-10-CM | POA: Diagnosis not present

## 2019-12-11 DIAGNOSIS — I429 Cardiomyopathy, unspecified: Secondary | ICD-10-CM | POA: Diagnosis not present

## 2019-12-11 NOTE — Patient Instructions (Signed)
Medication Instructions:  Your physician recommends that you continue on your current medications as directed. Please refer to the Current Medication list given to you today.  *If you need a refill on your cardiac medications before your next appointment, please call your pharmacy*  Follow-Up: At [facility], you and your health needs are our priority.  As part of our continuing mission to provide you with exceptional heart care, we have created designated Provider Care Teams.  These Care Teams include your primary Cardiologist (physician) and Advanced Practice Providers (APPs -  Physician Assistants and Nurse Practitioners) who all work together to provide you with the care you need, when you need it.  We recommend signing up for the patient portal called "MyChart".  Sign up information is provided on this After Visit Summary.  MyChart is used to connect with patients for Virtual Visits (Telemedicine).  Patients are able to view lab/test results, encounter notes, upcoming appointments, etc.  Non-urgent messages can be sent to your provider as well.   To learn more about what you can do with MyChart, go to [email or web address].    Your next appointment:   6 month(s)  The format for your next appointment:   In Person  Provider:    You may see DR CHRISTOPHER END or one of the following Advanced Practice Providers on your designated Care Team:    Christopher Berge, NP  Ryan Dunn, PA-C  Jacquelyn Visser, PA-C   

## 2019-12-11 NOTE — Progress Notes (Signed)
Follow-up Outpatient Visit Date: 12/11/2019  Primary Care Provider: [facility], [facility] [address]  Chief Complaint: Follow-up abnormal echo undergoing chemotherapy  HPI:  Heather Bell is a 44 y.o. female with history of hyperlipidemia, diabetes mellitus, breast cancer, and depression, as well as prior abnormal echo (LVEF low normal but reported as having global hypokinesis).  She presents today for follow-up.  Though echo was previously questionably abnormal, subsequent MUGA showed LVEF of 60%.  She is schedule to begin another round of chemotherapy next week.  She has been feeling relatively well, denying chest pain, shortness of breath, lightheadedness, edema, and orthopnea.  She underwent repeat MUGA scan earlier this week, which showed stable LVEF of 57%.  During the test, Heather Bell felt like her heart was pounding.  She otherwise has not had any palpitations.Marland Kitchen  --------------------------------------------------------------------------------------------------   Cardiovascular History & Procedures: Cardiovascular Problems:  Question cardiomyopathy  Risk Factors:  Diabetes mellitus, hyperlipidemia, and family history  Cath/PCI:  None  CV Surgery:  None  EP Procedures and Devices:  None  Non-Invasive Evaluation(s):  MUGA (12/08/2019): LVEF 57%.  MUGA (08/24/2019): LVEF 60%.  TTE (08/13/2019): Normal LV size and wall thickness.  LVEF 50-55% with global hypokinesis.  Normal RV size and function.  Aortic sclerosis.  Trivial mitral regurgitation.  Recent CV Pertinent Labs: Lab Results  Component Value Date   K 3.8 11/24/2019   K 3.4 (L) 03/29/2014   MG 1.8 11/24/2019   MG 6.0 (H) 06/09/2012   BUN 12 11/24/2019   BUN 6 (L) 03/29/2014   CREATININE 0.59 11/24/2019   CREATININE 0.85 03/29/2014    Past medical and surgical history were reviewed and updated in EPIC.  Current Meds  Medication Sig  .  amphetamine-dextroamphetamine (ADDERALL XR) 25 MG 24 hr capsule Take 25 mg by mouth daily.  . Calcium Carbonate-Vitamin D (CALCIUM 500 + D) 500-125 MG-UNIT TABS Take 1 tablet by mouth daily.  . cetirizine (ZYRTEC) 10 MG tablet Take 10 mg by mouth daily.   Marland Kitchen doxepin (SINEQUAN) 25 MG capsule Take 25 mg by mouth at bedtime.   Marland Kitchen ibuprofen (ADVIL) 200 MG tablet Take 400-800 mg by mouth every 6 (six) hours as needed for headache or moderate pain.  Marland Kitchen lamoTRIgine (LAMICTAL) 200 MG tablet Take 200 mg by mouth at bedtime.   . lidocaine-prilocaine (EMLA) cream Apply topically over port-a-cath 30 minutes to 1 hour prior to treatment.  Marland Kitchen LORazepam (ATIVAN) 0.5 MG tablet Take 1 tablet (0.5 mg total) by mouth every 6 (six) hours as needed (Nausea or vomiting).  Marland Kitchen lurasidone (LATUDA) 40 MG TABS tablet Take 40 mg by mouth at bedtime. Take with 60 mg to equal 100 mg at night  . Lurasidone HCl 60 MG TABS Take 60 mg by mouth at bedtime. Take with 40 mg to equal 100 mg at night  . metFORMIN (GLUCOPHAGE-XR) 500 MG 24 hr tablet Take 1,000 mg by mouth 2 (two) times daily.  . mometasone (NASONEX) 50 MCG/ACT nasal spray Place 2 sprays into the nose daily as needed (allergies).   . Multiple Vitamin (MULTIVITAMIN WITH MINERALS) TABS tablet Take 1 tablet by mouth daily.  Marland Kitchen omeprazole (PRILOSEC) 20 MG capsule Take 20 mg by mouth daily.   . ondansetron (ZOFRAN) 8 MG tablet Take 1 tablet (8 mg total) by mouth 2 (two) times daily as needed for refractory nausea / vomiting. Start on day 3 after carboplatin chemo.  . potassium chloride (KLOR-CON) 10  MEQ tablet Take 1 tablet (10 mEq total) by mouth as directed. Take 1 tablet daily as directed by MD.  . QUEtiapine (SEROQUEL XR) 300 MG 24 hr tablet Take 300 mg by mouth at bedtime.  . simvastatin (ZOCOR) 20 MG tablet Take 20 mg by mouth daily.  Marland Kitchen topiramate (TOPAMAX) 100 MG tablet Take 50-100 mg by mouth See admin instructions. Take 100 mg in the morning and 50 mg at night  . vitamin  B-12 (CYANOCOBALAMIN) 1000 MCG tablet Take 1,000 mcg by mouth daily.    Allergies: Compazine [prochlorperazine edisylate], Morphine and related, Doxycycline, Fluticasone, and Levofloxacin  Social History   Tobacco Use  . Smoking status: Never Smoker  . Smokeless tobacco: Never Used  Substance Use Topics  . Alcohol use: No    Alcohol/week: 0.0 standard drinks  . Drug use: No    Family History  Problem Relation Age of Onset  . Lung cancer Father   . Heart attack Father   . Lung cancer Paternal Uncle   . Heart disease Paternal Uncle   . Lung cancer Paternal Aunt     Review of Systems: A 12-system review of systems was performed and was negative except as noted in the HPI.  --------------------------------------------------------------------------------------------------  Physical Exam: BP 92/70 (BP Location: Left Arm, Patient Position: Sitting, Cuff Size: Normal)   Pulse 86   Ht 5\' 6"  (1.676 m)   Wt 135 lb 8 oz (61.5 kg)   SpO2 98%   BMI 21.87 kg/m   General:  NAD. Neck: No JVD or HJR. Lungs: CTA bilaterally. Heart: RRR w/o murmurs. Abd: Soft, NT/ND. Ext: No LE edema.  EKG:  NSR without abnormality.  Lab Results  Component Value Date   WBC 7.7 11/24/2019   HGB 10.0 (L) 11/24/2019   HCT 30.4 (L) 11/24/2019   MCV 92.7 11/24/2019   PLT 275 11/24/2019    Lab Results  Component Value Date   NA 139 11/24/2019   K 3.8 11/24/2019   CL 108 11/24/2019   CO2 23 11/24/2019   BUN 12 11/24/2019   CREATININE 0.59 11/24/2019   GLUCOSE 110 (H) 11/24/2019   ALT 18 11/24/2019    No results found for: CHOL, HDL, LDLCALC, LDLDIRECT, TRIG, CHOLHDL  --------------------------------------------------------------------------------------------------  ASSESSMENT AND PLAN: Breast cancer with questionable history of cardiomyopathy: Heather Bell appears euvolemic without clinical evidence of heart failure.  Repeat MUGA earlier this week showed normal LVEF of 57%.  We will  continue clinical follow-up with repeat assessment of LVEF as deemed necessary by Dr. Mike Gip based on chemotherapy regimen.  Given preserved LVEF and lack of heart failure symptoms, there is no indication for addition of beta-blocker or ACEI/ARB at that time (particularly in the setting of borderline low blood pressure).  Follow-up: Return to clinic in 6 months.  Nelva Bush, MD 12/12/2019 8:39 PM

## 2019-12-12 DIAGNOSIS — I429 Cardiomyopathy, unspecified: Secondary | ICD-10-CM | POA: Insufficient documentation

## 2019-12-14 NOTE — Progress Notes (Signed)
[facility]  [address], [address] [place], [place] [address] Phone: [phone]  Fax: [phone]   Clinic Day:  12/15/2019  Referring physician: [facility], [place]  Chief Complaint: Heather Bell is a 44 y.o. female with clinical stage IIBtriple negativerightbreast cancer who is seen for assessment prior to cycle #1 AC.  HPI: The patient was last seen in the medical oncology clinic on 11/24/2019. At that time, she was doing well.  She had ongoing left shoulder pain.  Breast lump was barely palpable. Hematocrit was 30.4, hemoglobin 10.0, platelets 275,000, WBC 7,700. Ferritin was 89 with iron saturation of 20% and a TIBC of 309. Vitamin B12 was 680.  She received week #12 Taxol and carboplatin. She received IVF secondary to chronic dehydration.   She had an initial consult with Dr. Rosalia Hammers In orthopaedics. Left shoulder pain started after subclavian chemo-port placement by Dr. Windell Moment on 08/07/2019. She denied any trauma. Her pain was constant and dull, throbbing, aching intermittent shooting episodes. Current left shoulder pain was rated a 4/10.  Her symptoms were related to subacromial bursitis. Patient advised home exercise, physical therapy, medication, massage, heat, and steroid injections as needed. Patient declined steroid injections. Patient was prescribed meloxicam.   Right breast ultrasound on 12/03/2019 revealed continued slight interval decrease in size of right breast mass 7 o'clock position. Targeted ultrasound was performed, showing a 1.3 x 1.5 x 1.2 cm irregular hypoechoic mass right breast 7 o'clock position 7 cm from nipple, slightly decreased in size from 10/09/2019 (1.7 x 1.4 x 1.4 cm).   MUGA scan on 12/08/2019 revealed LV ejection fraction equals 57.4%.   Patient had a follow up with Dr. Saunders Revel on 12/11/2019. She was doing well. She had no chest pain, shortness of breath, lightheadedness, edema or orthopnea. Patient advised  to continue follow up care. Patient will return to clinic in 6 months.   Symptomatically, she doesn't feel well. She has not been drinking fluids. She has been crying because her dog died. She has had some constipation. Her nausea is stable and well controlled with medication. Patient feels slightly lightheaded today. Her nails are discolored. Left shoulder pain is about the same, meloxicam did not improve pain; she may consider a cortisone shot.   Patient consented to initiation of treatment (cycle #1 AC) today.    Past Medical History:  Diagnosis Date  . Allergic rhinitis   . Anxiety   . Bipolar disorder ([place])    depression  . Cancer [facility])    breast right  . Depression   . Diabetes ([place])   . GERD (gastroesophageal reflux disease)   . Hyperlipidemia     Past Surgical History:  Procedure Laterality Date  . BREAST BIOPSY Right 07/22/2019   mass bx, path pending, heart marker  . BREAST BIOPSY Right 07/22/2019   LN bx, path pending,  butterfly hydromarker  . IMAGE GUIDED SINUS SURGERY    . KNEE SURGERY    . NASAL SEPTUM SURGERY    . PORTACATH PLACEMENT N/A 08/07/2019   Procedure: INSERTION PORT-A-CATH;  Surgeon: Herbert Pun, MD;  Location: [facility] ORS;  Service: General;  Laterality: N/A;  . TONSILLECTOMY      Family History  Problem Relation Age of Onset  . Lung cancer Father   . Heart attack Father   . Lung cancer Paternal Uncle   . Heart disease Paternal Uncle   . Lung cancer Paternal Aunt     Social History:  reports that  she has never smoked. She has never used smokeless tobacco. She reports that she does not drink alcohol or use drugs. Deniesany exposure to radiation or toxins.She has 2 children (29 year old daughter and 71 year old son).She use to work inthefast foodindustry. She is currently not working. Her husbandisMichael.[name] used to travel a lot for his job. Her husband found a new job closer to home in [place], [place]. The patient is alone  today.  Allergies:  Allergies  Allergen Reactions  . Compazine [Prochlorperazine Edisylate] Other (See Comments)    Acute dystonic reaction  . Morphine And Related Shortness Of Breath  . Doxycycline Other (See Comments)    "made all my symptoms worse"   . Fluticasone Other (See Comments)    Doesn't work  . Levofloxacin Other (See Comments)    "made all my symptoms worse"     Current Medications: Current Outpatient Medications  Medication Sig Dispense Refill  . amphetamine-dextroamphetamine (ADDERALL XR) 25 MG 24 hr capsule Take 25 mg by mouth daily.    . Calcium Carbonate-Vitamin D (CALCIUM 500 + D) 500-125 MG-UNIT TABS Take 1 tablet by mouth daily.    . cetirizine (ZYRTEC) 10 MG tablet Take 10 mg by mouth daily.     Marland Kitchen doxepin (SINEQUAN) 25 MG capsule Take 25 mg by mouth at bedtime.     Marland Kitchen ibuprofen (ADVIL) 200 MG tablet Take 400-800 mg by mouth every 6 (six) hours as needed for headache or moderate pain.    Marland Kitchen lamoTRIgine (LAMICTAL) 200 MG tablet Take 200 mg by mouth at bedtime.     . lidocaine-prilocaine (EMLA) cream Apply topically over port-a-cath 30 minutes to 1 hour prior to treatment. 30 g 2  . LORazepam (ATIVAN) 0.5 MG tablet Take 1 tablet (0.5 mg total) by mouth every 6 (six) hours as needed (Nausea or vomiting). 20 tablet 0  . lurasidone (LATUDA) 40 MG TABS tablet Take 40 mg by mouth at bedtime. Take with 60 mg to equal 100 mg at night    . Lurasidone HCl 60 MG TABS Take 60 mg by mouth at bedtime. Take with 40 mg to equal 100 mg at night    . metFORMIN (GLUCOPHAGE-XR) 500 MG 24 hr tablet Take 1,000 mg by mouth 2 (two) times daily.    . mometasone (NASONEX) 50 MCG/ACT nasal spray Place 2 sprays into the nose daily as needed (allergies).     . Multiple Vitamin (MULTIVITAMIN WITH MINERALS) TABS tablet Take 1 tablet by mouth daily.    Marland Kitchen omeprazole (PRILOSEC) 20 MG capsule Take 20 mg by mouth daily.     . ondansetron (ZOFRAN) 8 MG tablet Take 1 tablet (8 mg total) by mouth 2  (two) times daily as needed for refractory nausea / vomiting. Start on day 3 after carboplatin chemo. 30 tablet 1  . potassium chloride (KLOR-CON) 10 MEQ tablet Take 1 tablet (10 mEq total) by mouth as directed. Take 1 tablet daily as directed by MD. 30 tablet 0  . QUEtiapine (SEROQUEL XR) 300 MG 24 hr tablet Take 300 mg by mouth at bedtime.    . simvastatin (ZOCOR) 20 MG tablet Take 20 mg by mouth daily.    Marland Kitchen topiramate (TOPAMAX) 100 MG tablet Take 50-100 mg by mouth See admin instructions. Take 100 mg in the morning and 50 mg at night    . vitamin B-12 (CYANOCOBALAMIN) 1000 MCG tablet Take 1,000 mcg by mouth daily.     No current facility-administered medications for this visit.  Facility-Administered Medications Ordered in Other Visits  Medication Dose Route Frequency Provider Last Rate Last Admin  . heparin lock flush 100 unit/mL  500 Units Intravenous Once Doriann Zuch C, MD      . sodium chloride flush (NS) 0.9 % injection 10 mL  10 mL Intravenous PRN Lequita Asal, MD   10 mL at 12/15/19 9449    Review of Systems  Constitutional: Negative.  Negative for chills, diaphoresis, fever, malaise/fatigue and weight loss (stable).       She doesn't feel well.  HENT: Negative.  Negative for congestion, ear discharge, ear pain, hearing loss, nosebleeds, sinus pain and sore throat.        Nail discoloration.  Eyes: Negative.  Negative for blurred vision, double vision and photophobia.  Respiratory: Negative.  Negative for cough, hemoptysis, sputum production and shortness of breath.   Cardiovascular: Negative.  Negative for chest pain, palpitations and leg swelling.  Gastrointestinal: Positive for constipation (x 1 week; on MiraLax). Negative for abdominal pain, blood in stool, diarrhea, heartburn (reflux; on antacids; better), melena, nausea (stable) and vomiting.       She continues to struggle to drink fluids.  Genitourinary: Negative.  Negative for dysuria, frequency, hematuria  and urgency.       Premenopausal.  Musculoskeletal: Positive for joint pain (left shoulder not improved with meloxicam). Negative for back pain, myalgias and neck pain.  Skin: Negative for itching and rash.       RIGHT breast lump barely palpable.  Neurological: Negative.  Negative for dizziness, tingling, sensory change, focal weakness, weakness and headaches.       Slightly lightheaded.  Endo/Heme/Allergies: Negative.  Does not bruise/bleed easily.       Type II diabetes.  Psychiatric/Behavioral: Negative for depression and memory loss. The patient is not nervous/anxious and does not have insomnia.        Tearful as her dog died.  All other systems reviewed and are negative.  Performance status (ECOG): 1  Vitals Blood pressure (!) 83/59, pulse 98, temperature (!) 95.9 F (35.5 C), temperature source Tympanic, resp. rate 18, weight 137 lb (62.1 kg), SpO2 99 %.   Physical Exam  Constitutional: She is oriented to person, place, and time. She appears well-developed and well-nourished. No distress.  HENT:  Head: Normocephalic and atraumatic.  Mouth/Throat: Oropharynx is clear and moist. No oropharyngeal exudate.  Blue scarf.  Alopecia.  Mask.  Eyes: Pupils are equal, round, and reactive to light. Conjunctivae and EOM are normal. No scleral icterus.  Brown eyes.  Neck: No JVD present.  Cardiovascular: Normal rate, regular rhythm and normal heart sounds.  No murmur heard. Pulmonary/Chest: Effort normal and breath sounds normal. No respiratory distress. She has no wheezes. She has no rales. She exhibits no tenderness.  Abdominal: Soft. Bowel sounds are normal. She exhibits no distension and no mass. There is no abdominal tenderness. There is no rebound and no guarding.  Musculoskeletal:        General: No tenderness or edema.     Cervical back: Normal range of motion and neck supple.  Lymphadenopathy:       Head (right side): No preauricular, no posterior auricular and no occipital  adenopathy present.       Head (left side): No preauricular, no posterior auricular and no occipital adenopathy present.    She has no cervical adenopathy.    She has no axillary adenopathy.       Right: No inguinal and no supraclavicular adenopathy present.  Left: No inguinal and no supraclavicular adenopathy present.  Neurological: She is alert and oriented to person, place, and time.  Skin: Skin is warm and dry. No rash noted. She is not diaphoretic. No erythema. No pallor.  Nails changes s/p chemotherapy.  Psychiatric: She has a normal mood and affect. Her behavior is normal. Judgment and thought content normal.  Nursing note and vitals reviewed.   Infusion on 12/15/2019  Component Date Value Ref Range Status  . Magnesium 12/15/2019 1.9  1.7 - 2.4 mg/dL Final   Performed at [facility], [address], [place], [place] [address]  . WBC 12/15/2019 5.2  4.0 - 10.5 K/uL Final  . RBC 12/15/2019 3.59* 3.87 - 5.11 MIL/uL Final  . Hemoglobin 12/15/2019 11.2* 12.0 - 15.0 g/dL Final  . HCT 12/15/2019 34.1* 36.0 - 46.0 % Final  . MCV 12/15/2019 95.0  80.0 - 100.0 fL Final  . MCH 12/15/2019 31.2  26.0 - 34.0 pg Final  . MCHC 12/15/2019 32.8  30.0 - 36.0 g/dL Final  . RDW 12/15/2019 15.4  11.5 - 15.5 % Final  . Platelets 12/15/2019 201  150 - 400 K/uL Final  . nRBC 12/15/2019 0.0  0.0 - 0.2 % Final  . Neutrophils Relative % 12/15/2019 53  % Final  . Neutro Abs 12/15/2019 2.8  1.7 - 7.7 K/uL Final  . Lymphocytes Relative 12/15/2019 35  % Final  . Lymphs Abs 12/15/2019 1.8  0.7 - 4.0 K/uL Final  . Monocytes Relative 12/15/2019 8  % Final  . Monocytes Absolute 12/15/2019 0.4  0.1 - 1.0 K/uL Final  . Eosinophils Relative 12/15/2019 3  % Final  . Eosinophils Absolute 12/15/2019 0.2  0.0 - 0.5 K/uL Final  . Basophils Relative 12/15/2019 1  % Final  . Basophils Absolute 12/15/2019 0.0  0.0 - 0.1 K/uL Final  . Immature Granulocytes 12/15/2019 0  % Final  . Abs Immature  Granulocytes 12/15/2019 0.01  0.00 - 0.07 K/uL Final   Performed at [facility], [address] [place], [place] [address]  . Sodium 12/15/2019 138  135 - 145 mmol/L Final  . Potassium 12/15/2019 3.6  3.5 - 5.1 mmol/L Final  . Chloride 12/15/2019 107  98 - 111 mmol/L Final  . CO2 12/15/2019 22  22 - 32 mmol/L Final  . Glucose, Bld 12/15/2019 122* 70 - 99 mg/dL Final   Glucose reference range applies only to samples taken after fasting for at least 8 hours.  . BUN 12/15/2019 18  6 - 20 mg/dL Final  . Creatinine, Ser 12/15/2019 0.76  0.44 - 1.00 mg/dL Final  . Calcium 12/15/2019 9.3  8.9 - 10.3 mg/dL Final  . Total Protein 12/15/2019 7.5  6.5 - 8.1 g/dL Final  . Albumin 12/15/2019 4.5  3.5 - 5.0 g/dL Final  . AST 12/15/2019 16  15 - 41 U/L Final  . ALT 12/15/2019 14  0 - 44 U/L Final  . Alkaline Phosphatase 12/15/2019 49  38 - 126 U/L Final  . Total Bilirubin 12/15/2019 0.7  0.3 - 1.2 mg/dL Final  . GFR calc non Af Amer 12/15/2019 >60  >60 mL/min Final  . GFR calc Af Amer 12/15/2019 >60  >60 mL/min Final  . Anion gap 12/15/2019 9  5 - 15 Final   Performed at [facility], [address] [place], [place] [address]  . Preg Test, Ur 12/15/2019 NEGATIVE  NEGATIVE Final   Performed at [facility]  [facility], [address] [place], [place] [address]    Assessment:  Heather Bell is a 44 y.o. female with clinical T1cN1triple negativerightbreast cancers/p ultrasound guided core biopsy. Pathology revealeda grade IIIinvasive mammary carcinoma, no special type of the right breast. Specimen was 10 mm.Ductal carcinoma in situ(DCIS)was not identified.Right axillarylymph nodewas positive for metastatic carcinoma.Tumor was ER - (<1%), PR - (<1%), and Her2/neu -.  Bilateral mammogram and right unilateral ultrasoundon 07/10/2019 revealed a 1.7 x 1.6 x 1.6 cmmass at the7 o'clock region of the right breast 7 cm from the nipple. There was an abnormal1.4 x  0.7 x 0.9 cmaxillary lymph node. Therewas a second3 mmaxillary lymph node with minimal focal cortical thickening.An ultrasound-guided core biopsies of the mass and the enlarged right axillary lymph node was recommended. BI-RADS category 5.  PET scanon 08/11/2019 showed a hypermetabolic mass in the inferior RIGHT breast consistentwith herbreast carcinoma. There was noevidence hypermetabolic nodal metastasis.There was no distant metastatic disease.There was nopulmonary metastasis or skeletal metastasis.  Echoon 12/24/2020revealedan ejection fraction of50 to 55%. The left ventricle demonstrated global hypokinesis.MUGAon 08/24/2019 revealed an EF of 60%.MUGA on 12/08/2019 revealed an EF of 57.4%. She is followed by Dr End.  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  She received 12 weeksof Taxol + carboplatin(08/25/2019 - 11/24/2019).Carboplatin was held on week #7 and 8 secondary to marginal counts. She received Granix on 10/28/2019 and 11/20/2019.  Breast masswas larger after initiation of treatment felt secondary to post biopsy hematoma. Right breast ultrasoundon 09/07/2019 revealed the mass in the 7 o'clock position contained cystic components and measured 3 cm (compared to 1.7 cm on 07/10/2019). The biopsied lymph node with 3 mm cortical thickening in the axilla was similar. Right breast ultrasoundon 10/09/2019 revealed interval decrease in size of RIGHT breast mass (1.4 x 1.7 x 1.4 cm). Mass was less well defined and associated with parenchymal edema.  Right breast ultrasound on 12/03/2019 revealed a 1.3 x 1.5 x 1.2 cm irregular hypoechoic mass right breast 7 o'clock position 7 cm from nipple.   She is premenopausal. She stopped taking birth control pills on 08/17/2019.  She has iron deficiency. Ferritin was 21 with an iron saturation of 7% and a TIBC  343 on 09/08/2019.She received Venoferweekly x 3 (09/18/2019 - 10/06/2019).  She has B12 deficiency. B12 was 240 on 01/19/2021and 680 on 11/24/2019.She is on oral B12.  She has recurrent orthostatic hypotension. Fluid intake is marginal. AM cortisol was 5.3 (low) on 09/22/2019 suggestive of adrenal insufficiency.ACTH stim test was negative.  Symptomatically, she doesn't feel well today as she is dehydrated. She is sad with the loss of her dog.  Exam is stable.   Plan: 1.   Labs today: CBC with diff, CMP, Mg. 2.Clinical stageIIB triple negativeright breast cancer She is undergoingneoadjuvant chemotherapy. Weekly Taxol + carboplatin x 12 followed by AC x 4 with Neulasta support. She is s/p 12 weeks ofTaxol and carboplatin. She denies any neuropathy. Right breast ultrasound on 12/03/2019 revealed a 1.3 x 1.5 x 1.2 cm hypoechoic mass.              Review interval MUGA scan- spoke with Dr End today.   MUGA reveals an EF of 57.4%.   Suspect initial echo was spurious.   Discuss plan to repeat MUGA after 2 cycles of AC.  Labs reviewed.  Begin cycle #1 AC with Fulphila (pegfilgrastim-jmb) support.   Potential side effects reviewed.   Discuss Decadron  post treatment.   Discuss Claritin to prevent growth factor induced bone pain.   Patient consented to treatment.  Discuss symptom management.  She has antiemetics at home to use on a prn bases.  Interventions are adequate.   .  After completion of chemotherapy, review plan for surgery. 3. Iron deficiency anemia Hematocrit 34.1, hemoglobin 11.2, MCV 95.0 today.   Counts improved on weeks off treatment. Ferritin 89 on 11/24/2019. Suspect chemotherapy-induced anemia.             Continue to monitor. 4. B12 deficiency B12 was 240 on 01/19/2021and 680 on 11/24/2019. Monitor folate  annually. 5.Chronic dehydration             IVF 1 liter NS today.    RTC on 12/21/2019 for IVF.    Discuss continued fluids as needed (previously twice a week) during [facility] chemotherapy. 6.   Left shoulder pain             Patient has seen orthopedics.  Symptoms felt related to subacromial bursitis.  Patient experienced no relief with meloxicam.  She is considering a cortisone injection. 7.Cycle #1 AC today. 8.   RTC tomorrow for Fulphila (pegfilgrastim). 9.   RTC on 12/21/2019 for IVF 10.   RTC on 12/24/2019 for MD assessment, labs (CBC with diff, BMP), and +/- IVF.  I discussed the assessment and treatment plan with the patient.  The patient was provided an opportunity to ask questions and all were answered.  The patient agreed with the plan and demonstrated an understanding of the instructions.  The patient was advised to call back if the symptoms worsen or if the condition fails to improve as anticipated.   I provided 21 minutes of face-to-face time during this encounter and > 50% was spent counseling as documented under my assessment and plan.  An additional 10 minutes were spent reviewing her chart (Epic and Care Everywhere) including notes, labs, and imaging studies as well as speaking with Dr End.  I provided these services from the [facility] office.   Lequita Asal, MD, PhD    12/15/2019, 10:16 AM  I, Selena Batten, am acting as scribe for [employer]. Mike Gip, MD, PhD.  I, Destyni Hoppel C. Mike Gip, MD, have reviewed the above documentation for accuracy and completeness, and I agree with the above.

## 2019-12-15 ENCOUNTER — Inpatient Hospital Stay: Payer: BC Managed Care – PPO

## 2019-12-15 ENCOUNTER — Inpatient Hospital Stay (HOSPITAL_BASED_OUTPATIENT_CLINIC_OR_DEPARTMENT_OTHER): Payer: BC Managed Care – PPO | Admitting: Hematology and Oncology

## 2019-12-15 ENCOUNTER — Other Ambulatory Visit: Payer: Self-pay

## 2019-12-15 ENCOUNTER — Encounter: Payer: Self-pay | Admitting: Hematology and Oncology

## 2019-12-15 VITALS — BP 83/59 | HR 98 | Temp 95.9°F | Resp 18 | Wt 137.0 lb

## 2019-12-15 VITALS — BP 100/66 | HR 90 | Resp 16

## 2019-12-15 DIAGNOSIS — Z171 Estrogen receptor negative status [ER-]: Secondary | ICD-10-CM

## 2019-12-15 DIAGNOSIS — D509 Iron deficiency anemia, unspecified: Secondary | ICD-10-CM

## 2019-12-15 DIAGNOSIS — C50919 Malignant neoplasm of unspecified site of unspecified female breast: Secondary | ICD-10-CM

## 2019-12-15 DIAGNOSIS — Z5111 Encounter for antineoplastic chemotherapy: Secondary | ICD-10-CM

## 2019-12-15 DIAGNOSIS — E2740 Unspecified adrenocortical insufficiency: Secondary | ICD-10-CM | POA: Diagnosis not present

## 2019-12-15 DIAGNOSIS — R7989 Other specified abnormal findings of blood chemistry: Secondary | ICD-10-CM

## 2019-12-15 DIAGNOSIS — E538 Deficiency of other specified B group vitamins: Secondary | ICD-10-CM

## 2019-12-15 DIAGNOSIS — M7552 Bursitis of left shoulder: Secondary | ICD-10-CM

## 2019-12-15 DIAGNOSIS — C50511 Malignant neoplasm of lower-outer quadrant of right female breast: Secondary | ICD-10-CM | POA: Diagnosis not present

## 2019-12-15 LAB — COMPREHENSIVE METABOLIC PANEL
ALT: 14 U/L (ref 0–44)
AST: 16 U/L (ref 15–41)
Albumin: 4.5 g/dL (ref 3.5–5.0)
Alkaline Phosphatase: 49 U/L (ref 38–126)
Anion gap: 9 (ref 5–15)
BUN: 18 mg/dL (ref 6–20)
CO2: 22 mmol/L (ref 22–32)
Calcium: 9.3 mg/dL (ref 8.9–10.3)
Chloride: 107 mmol/L (ref 98–111)
Creatinine, Ser: 0.76 mg/dL (ref 0.44–1.00)
GFR calc Af Amer: >60 mL/min (ref >60)
GFR calc non Af Amer: >60 mL/min (ref >60)
Glucose, Bld: 122 mg/dL — ABNORMAL HIGH (ref 70–99)
Potassium: 3.6 mmol/L (ref 3.5–5.1)
Sodium: 138 mmol/L (ref 135–145)
Total Bilirubin: 0.7 mg/dL (ref 0.3–1.2)
Total Protein: 7.5 g/dL (ref 6.5–8.1)

## 2019-12-15 LAB — CBC WITH DIFFERENTIAL/PLATELET
Abs Immature Granulocytes: 0.01 10*3/uL (ref 0.00–0.07)
Basophils Absolute: 0 10*3/uL (ref 0.0–0.1)
Basophils Relative: 1 %
Eosinophils Absolute: 0.2 10*3/uL (ref 0.0–0.5)
Eosinophils Relative: 3 %
HCT: 34.1 % — ABNORMAL LOW (ref 36.0–46.0)
Hemoglobin: 11.2 g/dL — ABNORMAL LOW (ref 12.0–15.0)
Immature Granulocytes: 0 %
Lymphocytes Relative: 35 %
Lymphs Abs: 1.8 10*3/uL (ref 0.7–4.0)
MCH: 31.2 pg (ref 26.0–34.0)
MCHC: 32.8 g/dL (ref 30.0–36.0)
MCV: 95 fL (ref 80.0–100.0)
Monocytes Absolute: 0.4 10*3/uL (ref 0.1–1.0)
Monocytes Relative: 8 %
Neutro Abs: 2.8 10*3/uL (ref 1.7–7.7)
Neutrophils Relative %: 53 %
Platelets: 201 10*3/uL (ref 150–400)
RBC: 3.59 MIL/uL — ABNORMAL LOW (ref 3.87–5.11)
RDW: 15.4 % (ref 11.5–15.5)
WBC: 5.2 10*3/uL (ref 4.0–10.5)
nRBC: 0 % (ref 0.0–0.2)

## 2019-12-15 LAB — PREGNANCY, URINE: Preg Test, Ur: NEGATIVE

## 2019-12-15 LAB — MAGNESIUM: Magnesium: 1.9 mg/dL (ref 1.7–2.4)

## 2019-12-15 MED ORDER — SODIUM CHLORIDE 0.9 % IV SOLN
600.0000 mg/m2 | Freq: Once | INTRAVENOUS | Status: AC
  Administered 2019-12-15: 1000 mg via INTRAVENOUS
  Filled 2019-12-15: qty 50

## 2019-12-15 MED ORDER — HEPARIN SOD (PORK) LOCK FLUSH 100 UNIT/ML IV SOLN
500.0000 [IU] | Freq: Once | INTRAVENOUS | Status: AC
  Administered 2019-12-15: 500 [IU] via INTRAVENOUS
  Filled 2019-12-15: qty 5

## 2019-12-15 MED ORDER — SODIUM CHLORIDE 0.9 % IV SOLN
Freq: Once | INTRAVENOUS | Status: AC
  Filled 2019-12-15: qty 250

## 2019-12-15 MED ORDER — DEXAMETHASONE 4 MG PO TABS
ORAL_TABLET | ORAL | 1 refills | Status: DC
Start: 2019-12-15 — End: 2019-12-15

## 2019-12-15 MED ORDER — SODIUM CHLORIDE 0.9 % IV SOLN
150.0000 mg | Freq: Once | INTRAVENOUS | Status: AC
  Administered 2019-12-15: 12:00:00 150 mg via INTRAVENOUS
  Filled 2019-12-15: qty 5

## 2019-12-15 MED ORDER — SODIUM CHLORIDE 0.9% FLUSH
10.0000 mL | INTRAVENOUS | Status: DC | PRN
  Administered 2019-12-15: 10 mL via INTRAVENOUS
  Filled 2019-12-15: qty 10

## 2019-12-15 MED ORDER — DOXORUBICIN HCL CHEMO IV INJECTION 2 MG/ML
60.0000 mg/m2 | Freq: Once | INTRAVENOUS | Status: AC
  Administered 2019-12-15: 100 mg via INTRAVENOUS
  Filled 2019-12-15: qty 50

## 2019-12-15 MED ORDER — DEXAMETHASONE 4 MG PO TABS: ORAL_TABLET | ORAL | 1 refills | Status: DC

## 2019-12-15 MED ORDER — PALONOSETRON HCL INJECTION 0.25 MG/5ML
0.2500 mg | Freq: Once | INTRAVENOUS | Status: AC
  Administered 2019-12-15: 0.25 mg via INTRAVENOUS

## 2019-12-15 MED ORDER — SODIUM CHLORIDE 0.9 % IV SOLN
10.0000 mg | Freq: Once | INTRAVENOUS | Status: AC
  Administered 2019-12-15: 10 mg via INTRAVENOUS
  Filled 2019-12-15: qty 10

## 2019-12-15 NOTE — Progress Notes (Signed)
12/15/19  Ok to proceed with vitals - receiving 1 liter NS with treatment.  Marcell Anger RN/Klee Kolek Ronnald Ramp, PharmD

## 2019-12-15 NOTE — Progress Notes (Signed)
Patients dog died, she is very upset. She states that she doesn't feel well.  BP @ 0917 83/59, heart rate 98 sitting BP @ 0918 71/47, heart rate 109 standing Patient has IV fluids running now.  Shoulder pain is "about the same" patient states she may get cortisone shot  Has had some constipation. Nausea is okay Nails are discolored. Patient is slightly lightheaded

## 2019-12-15 NOTE — Patient Instructions (Signed)
Cyclophosphamide Injection What is this medicine? CYCLOPHOSPHAMIDE (sye kloe FOSS fa mide) is a chemotherapy drug. It slows the growth of cancer cells. This medicine is used to treat many types of cancer like lymphoma, myeloma, leukemia, breast cancer, and ovarian cancer, to name a few. This medicine may be used for other purposes; ask your health care provider or pharmacist if you have questions. COMMON BRAND NAME(S): Cytoxan, Neosar What should I tell my health care provider before I take this medicine? They need to know if you have any of these conditions:  heart disease  history of irregular heartbeat  infection  kidney disease  liver disease  low blood counts, like white cells, platelets, or red blood cells  on hemodialysis  recent or ongoing radiation therapy  scarring or thickening of the lungs  trouble passing urine  an unusual or allergic reaction to cyclophosphamide, other medicines, foods, dyes, or preservatives  pregnant or trying to get pregnant  breast-feeding How should I use this medicine? This drug is usually given as an injection into a vein or muscle or by infusion into a vein. It is administered in a hospital or clinic by a specially trained health care professional. Talk to your pediatrician regarding the use of this medicine in children. Special care may be needed. Overdosage: If you think you have taken too much of this medicine contact a poison control center or emergency room at once. NOTE: This medicine is only for you. Do not share this medicine with others. What if I miss a dose? It is important not to miss your dose. Call your doctor or health care professional if you are unable to keep an appointment. What may interact with this medicine?  amphotericin B  azathioprine  certain antivirals for HIV or hepatitis  certain medicines for blood pressure, heart disease, irregular heart beat  certain medicines that treat or prevent blood clots  like warfarin  certain other medicines for cancer  cyclosporine  etanercept  indomethacin  medicines that relax muscles for surgery  medicines to increase blood counts  metronidazole This list may not describe all possible interactions. Give your health care provider a list of all the medicines, herbs, non-prescription drugs, or dietary supplements you use. Also tell them if you smoke, drink alcohol, or use illegal drugs. Some items may interact with your medicine. What should I watch for while using this medicine? Your condition will be monitored carefully while you are receiving this medicine. You may need blood work done while you are taking this medicine. Drink water or other fluids as directed. Urinate often, even at night. Some products may contain alcohol. Ask your health care professional if this medicine contains alcohol. Be sure to tell all health care professionals you are taking this medicine. Certain medicines, like metronidazole and disulfiram, can cause an unpleasant reaction when taken with alcohol. The reaction includes flushing, headache, nausea, vomiting, sweating, and increased thirst. The reaction can last from 30 minutes to several hours. Do not become pregnant while taking this medicine or for 1 year after stopping it. Women should inform their health care professional if they wish to become pregnant or think they might be pregnant. Men should not father a child while taking this medicine and for 4 months after stopping it. There is potential for serious side effects to an unborn child. Talk to your health care professional for more information. Do not breast-feed an infant while taking this medicine or for 1 week after stopping it. This medicine has  caused ovarian failure in some women. This medicine may make it more difficult to get pregnant. Talk to your health care professional if you are concerned about your fertility. This medicine has caused decreased sperm  counts in some men. This may make it more difficult to father a child. Talk to your health care professional if you are concerned about your fertility. Call your health care professional for advice if you get a fever, chills, or sore throat, or other symptoms of a cold or flu. Do not treat yourself. This medicine decreases your body's ability to fight infections. Try to avoid being around people who are sick. Avoid taking medicines that contain aspirin, acetaminophen, ibuprofen, naproxen, or ketoprofen unless instructed by your health care professional. These medicines may hide a fever. Talk to your health care professional about your risk of cancer. You may be more at risk for certain types of cancer if you take this medicine. If you are going to need surgery or other procedure, tell your health care professional that you are using this medicine. Be careful brushing or flossing your teeth or using a toothpick because you may get an infection or bleed more easily. If you have any dental work done, tell your dentist you are receiving this medicine. What side effects may I notice from receiving this medicine? Side effects that you should report to your doctor or health care professional as soon as possible:  allergic reactions like skin rash, itching or hives, swelling of the face, lips, or tongue  breathing problems  nausea, vomiting  signs and symptoms of bleeding such as bloody or black, tarry stools; red or dark brown urine; spitting up blood or brown material that looks like coffee grounds; red spots on the skin; unusual bruising or bleeding from the eyes, gums, or nose  signs and symptoms of heart failure like fast, irregular heartbeat, sudden weight gain; swelling of the ankles, feet, hands  signs and symptoms of infection like fever; chills; cough; sore throat; pain or trouble passing urine  signs and symptoms of kidney injury like trouble passing urine or change in the amount of  urine  signs and symptoms of liver injury like dark yellow or brown urine; general ill feeling or flu-like symptoms; light-colored stools; loss of appetite; nausea; right upper belly pain; unusually weak or tired; yellowing of the eyes or skin Side effects that usually do not require medical attention (report to your doctor or health care professional if they continue or are bothersome):  confusion  decreased hearing  diarrhea  facial flushing  hair loss  headache  loss of appetite  missed menstrual periods  signs and symptoms of low red blood cells or anemia such as unusually weak or tired; feeling faint or lightheaded; falls  skin discoloration This list may not describe all possible side effects. Call your doctor for medical advice about side effects. You may report side effects to FDA at 1-800-FDA-1088. Where should I keep my medicine? This drug is given in a hospital or clinic and will not be stored at home. NOTE: This sheet is a summary. It may not cover all possible information. If you have questions about this medicine, talk to your doctor, pharmacist, or health care provider.  2020 Elsevier/Gold Standard (2019-05-11 09:53:29) Doxorubicin injection What is this medicine? DOXORUBICIN (dox oh ROO bi sin) is a chemotherapy drug. It is used to treat many kinds of cancer like leukemia, lymphoma, neuroblastoma, sarcoma, and Wilms' tumor. It is also used to treat bladder   cancer, breast cancer, lung cancer, ovarian cancer, stomach cancer, and thyroid cancer. This medicine may be used for other purposes; ask your health care provider or pharmacist if you have questions. COMMON BRAND NAME(S): Adriamycin, Adriamycin PFS, Adriamycin RDF, Rubex What should I tell my health care provider before I take this medicine? They need to know if you have any of these conditions:  heart disease  history of low blood counts caused by a medicine  liver disease  recent or ongoing radiation  therapy  an unusual or allergic reaction to doxorubicin, other chemotherapy agents, other medicines, foods, dyes, or preservatives  pregnant or trying to get pregnant  breast-feeding How should I use this medicine? This drug is given as an infusion into a vein. It is administered in a hospital or clinic by a specially trained health care professional. If you have pain, swelling, burning or any unusual feeling around the site of your injection, tell your health care professional right away. Talk to your pediatrician regarding the use of this medicine in children. Special care may be needed. Overdosage: If you think you have taken too much of this medicine contact a poison control center or emergency room at once. NOTE: This medicine is only for you. Do not share this medicine with others. What if I miss a dose? It is important not to miss your dose. Call your doctor or health care professional if you are unable to keep an appointment. What may interact with this medicine? This medicine may interact with the following medications:  6-mercaptopurine  paclitaxel  phenytoin  St. John's Wort  trastuzumab  verapamil This list may not describe all possible interactions. Give your health care provider a list of all the medicines, herbs, non-prescription drugs, or dietary supplements you use. Also tell them if you smoke, drink alcohol, or use illegal drugs. Some items may interact with your medicine. What should I watch for while using this medicine? This drug may make you feel generally unwell. This is not uncommon, as chemotherapy can affect healthy cells as well as cancer cells. Report any side effects. Continue your course of treatment even though you feel ill unless your doctor tells you to stop. There is a maximum amount of this medicine you should receive throughout your life. The amount depends on the medical condition being treated and your overall health. Your doctor will watch how  much of this medicine you receive in your lifetime. Tell your doctor if you have taken this medicine before. You may need blood work done while you are taking this medicine. Your urine may turn red for a few days after your dose. This is not blood. If your urine is dark or brown, call your doctor. In some cases, you may be given additional medicines to help with side effects. Follow all directions for their use. Call your doctor or health care professional for advice if you get a fever, chills or sore throat, or other symptoms of a cold or flu. Do not treat yourself. This drug decreases your body's ability to fight infections. Try to avoid being around people who are sick. This medicine may increase your risk to bruise or bleed. Call your doctor or health care professional if you notice any unusual bleeding. Talk to your doctor about your risk of cancer. You may be more at risk for certain types of cancers if you take this medicine. Do not become pregnant while taking this medicine or for 6 months after stopping it. Women should inform   their doctor if they wish to become pregnant or think they might be pregnant. Men should not father a child while taking this medicine and for 6 months after stopping it. There is a potential for serious side effects to an unborn child. Talk to your health care professional or pharmacist for more information. Do not breast-feed an infant while taking this medicine. This medicine has caused ovarian failure in some women and reduced sperm counts in some men This medicine may interfere with the ability to have a child. Talk with your doctor or health care professional if you are concerned about your fertility. This medicine may cause a decrease in Co-Enzyme Q-10. You should make sure that you get enough Co-Enzyme Q-10 while you are taking this medicine. Discuss the foods you eat and the vitamins you take with your health care professional. What side effects may I notice from  receiving this medicine? Side effects that you should report to your doctor or health care professional as soon as possible:  allergic reactions like skin rash, itching or hives, swelling of the face, lips, or tongue  breathing problems  chest pain  fast or irregular heartbeat  low blood counts - this medicine may decrease the number of white blood cells, red blood cells and platelets. You may be at increased risk for infections and bleeding.  pain, redness, or irritation at site where injected  signs of infection - fever or chills, cough, sore throat, pain or difficulty passing urine  signs of decreased platelets or bleeding - bruising, pinpoint red spots on the skin, black, tarry stools, blood in the urine  swelling of the ankles, feet, hands  tiredness  weakness Side effects that usually do not require medical attention (report to your doctor or health care professional if they continue or are bothersome):  diarrhea  hair loss  mouth sores  nail discoloration or damage  nausea  red colored urine  vomiting This list may not describe all possible side effects. Call your doctor for medical advice about side effects. You may report side effects to FDA at 1-800-FDA-1088. Where should I keep my medicine? This drug is given in a hospital or clinic and will not be stored at home. NOTE: This sheet is a summary. It may not cover all possible information. If you have questions about this medicine, talk to your doctor, pharmacist, or health care provider.  2020 Elsevier/Gold Standard (2017-03-20 11:01:26)  

## 2019-12-16 ENCOUNTER — Ambulatory Visit: Payer: BC Managed Care – PPO

## 2019-12-16 ENCOUNTER — Inpatient Hospital Stay: Payer: BC Managed Care – PPO

## 2019-12-16 VITALS — BP 101/65 | HR 98 | Temp 97.9°F | Resp 16

## 2019-12-16 DIAGNOSIS — C50919 Malignant neoplasm of unspecified site of unspecified female breast: Secondary | ICD-10-CM

## 2019-12-16 DIAGNOSIS — D509 Iron deficiency anemia, unspecified: Secondary | ICD-10-CM

## 2019-12-16 DIAGNOSIS — C50511 Malignant neoplasm of lower-outer quadrant of right female breast: Secondary | ICD-10-CM | POA: Diagnosis not present

## 2019-12-16 MED ORDER — TBO-FILGRASTIM 300 MCG/0.5ML ~~LOC~~ SOSY: 300.0000 ug | PREFILLED_SYRINGE | Freq: Once | SUBCUTANEOUS | Status: DC

## 2019-12-16 MED ORDER — PEGFILGRASTIM-JMDB 6 MG/0.6ML ~~LOC~~ SOSY
6.0000 mg | PREFILLED_SYRINGE | Freq: Once | SUBCUTANEOUS | Status: AC
  Administered 2019-12-16: 16:00:00 6 mg via SUBCUTANEOUS
  Filled 2019-12-16: qty 0.6

## 2019-12-16 NOTE — Patient Instructions (Signed)
Pegfilgrastim injection What is this medicine? PEGFILGRASTIM (PEG fil gra stim) is a Oliphant-acting granulocyte colony-stimulating factor that stimulates the growth of neutrophils, a type of white blood cell important in the body's fight against infection. It is used to reduce the incidence of fever and infection in patients with certain types of cancer who are receiving chemotherapy that affects the bone marrow, and to increase survival after being exposed to high doses of radiation. This medicine may be used for other purposes; ask your health care provider or pharmacist if you have questions. COMMON BRAND NAME(S): Fulphila, Neulasta, UDENYCA, Ziextenzo What should I tell my health care provider before I take this medicine? They need to know if you have any of these conditions:  kidney disease  latex allergy  ongoing radiation therapy  sickle cell disease  skin reactions to acrylic adhesives (On-Body Injector only)  an unusual or allergic reaction to pegfilgrastim, filgrastim, other medicines, foods, dyes, or preservatives  pregnant or trying to get pregnant  breast-feeding How should I use this medicine? This medicine is for injection under the skin. If you get this medicine at home, you will be taught how to prepare and give the pre-filled syringe or how to use the On-body Injector. Refer to the patient Instructions for Use for detailed instructions. Use exactly as directed. Tell your healthcare provider immediately if you suspect that the On-body Injector may not have performed as intended or if you suspect the use of the On-body Injector resulted in a missed or partial dose. It is important that you put your used needles and syringes in a special sharps container. Do not put them in a trash can. If you do not have a sharps container, call your pharmacist or healthcare provider to get one. Talk to your pediatrician regarding the use of this medicine in children. While this drug may be  prescribed for selected conditions, precautions do apply. Overdosage: If you think you have taken too much of this medicine contact a poison control center or emergency room at once. NOTE: This medicine is only for you. Do not share this medicine with others. What if I miss a dose? It is important not to miss your dose. Call your doctor or health care professional if you miss your dose. If you miss a dose due to an On-body Injector failure or leakage, a new dose should be administered as soon as possible using a single prefilled syringe for manual use. What may interact with this medicine? Interactions have not been studied. Give your health care provider a list of all the medicines, herbs, non-prescription drugs, or dietary supplements you use. Also tell them if you smoke, drink alcohol, or use illegal drugs. Some items may interact with your medicine. This list may not describe all possible interactions. Give your health care provider a list of all the medicines, herbs, non-prescription drugs, or dietary supplements you use. Also tell them if you smoke, drink alcohol, or use illegal drugs. Some items may interact with your medicine. What should I watch for while using this medicine? You may need blood work done while you are taking this medicine. If you are going to need a MRI, CT scan, or other procedure, tell your doctor that you are using this medicine (On-Body Injector only). What side effects may I notice from receiving this medicine? Side effects that you should report to your doctor or health care professional as soon as possible:  allergic reactions like skin rash, itching or hives, swelling of the   face, lips, or tongue  back pain  dizziness  fever  pain, redness, or irritation at site where injected  pinpoint red spots on the skin  red or dark-brown urine  shortness of breath or breathing problems  stomach or side pain, or pain at the  shoulder  swelling  tiredness  trouble passing urine or change in the amount of urine Side effects that usually do not require medical attention (report to your doctor or health care professional if they continue or are bothersome):  bone pain  muscle pain This list may not describe all possible side effects. Call your doctor for medical advice about side effects. You may report side effects to FDA at 1-800-FDA-1088. Where should I keep my medicine? Keep out of the reach of children. If you are using this medicine at home, you will be instructed on how to store it. Throw away any unused medicine after the expiration date on the label. NOTE: This sheet is a summary. It may not cover all possible information. If you have questions about this medicine, talk to your doctor, pharmacist, or health care provider.  2020 [employer]vier/Gold Standard (2017-11-11 16:57:08)  

## 2019-12-20 ENCOUNTER — Other Ambulatory Visit: Payer: Self-pay

## 2019-12-20 ENCOUNTER — Emergency Department: Payer: BC Managed Care – PPO

## 2019-12-20 ENCOUNTER — Emergency Department
Admission: EM | Admit: 2019-12-20 | Discharge: 2019-12-20 | Disposition: A | Discharge status: Home / Self Care | Payer: BC Managed Care – PPO | Attending: Emergency Medicine | Admitting: Emergency Medicine

## 2019-12-20 DIAGNOSIS — R531 Weakness: Secondary | ICD-10-CM | POA: Diagnosis present

## 2019-12-20 DIAGNOSIS — Z79899 Other long term (current) drug therapy: Secondary | ICD-10-CM | POA: Insufficient documentation

## 2019-12-20 DIAGNOSIS — E119 Type 2 diabetes mellitus without complications: Secondary | ICD-10-CM | POA: Diagnosis not present

## 2019-12-20 DIAGNOSIS — Z7984 Long term (current) use of oral hypoglycemic drugs: Secondary | ICD-10-CM | POA: Insufficient documentation

## 2019-12-20 DIAGNOSIS — C50011 Malignant neoplasm of nipple and areola, right female breast: Secondary | ICD-10-CM | POA: Insufficient documentation

## 2019-12-20 DIAGNOSIS — E860 Dehydration: Secondary | ICD-10-CM | POA: Diagnosis not present

## 2019-12-20 DIAGNOSIS — Z171 Estrogen receptor negative status [ER-]: Secondary | ICD-10-CM | POA: Insufficient documentation

## 2019-12-20 LAB — BASIC METABOLIC PANEL
Anion gap: 8 (ref 5–15)
BUN: 14 mg/dL (ref 6–20)
CO2: 26 mmol/L (ref 22–32)
Calcium: 9.5 mg/dL (ref 8.9–10.3)
Chloride: 106 mmol/L (ref 98–111)
Creatinine, Ser: 0.59 mg/dL (ref 0.44–1.00)
GFR calc Af Amer: >60 mL/min (ref >60)
GFR calc non Af Amer: >60 mL/min (ref >60)
Glucose, Bld: 120 mg/dL — ABNORMAL HIGH (ref 70–99)
Potassium: 3.6 mmol/L (ref 3.5–5.1)
Sodium: 140 mmol/L (ref 135–145)

## 2019-12-20 LAB — CBC
HCT: 35 % — ABNORMAL LOW (ref 36.0–46.0)
Hemoglobin: 11.3 g/dL — ABNORMAL LOW (ref 12.0–15.0)
MCH: 31.3 pg (ref 26.0–34.0)
MCHC: 32.3 g/dL (ref 30.0–36.0)
MCV: 97 fL (ref 80.0–100.0)
Platelets: 147 10*3/uL — ABNORMAL LOW (ref 150–400)
RBC: 3.61 MIL/uL — ABNORMAL LOW (ref 3.87–5.11)
RDW: 14.5 % (ref 11.5–15.5)
WBC: 8.4 10*3/uL (ref 4.0–10.5)
nRBC: 0 % (ref 0.0–0.2)

## 2019-12-20 LAB — HEPATIC FUNCTION PANEL
ALT: 12 U/L (ref 0–44)
AST: 13 U/L — ABNORMAL LOW (ref 15–41)
Albumin: 4.3 g/dL (ref 3.5–5.0)
Alkaline Phosphatase: 81 U/L (ref 38–126)
Bilirubin, Direct: <0.1 mg/dL (ref 0.0–0.2)
Total Bilirubin: 0.9 mg/dL (ref 0.3–1.2)
Total Protein: 6.9 g/dL (ref 6.5–8.1)

## 2019-12-20 LAB — URINALYSIS, COMPLETE (UACMP) WITH MICROSCOPIC
Bilirubin Urine: NEGATIVE
Glucose, UA: NEGATIVE mg/dL
Ketones, ur: NEGATIVE mg/dL
Nitrite: NEGATIVE
Protein, ur: NEGATIVE mg/dL
Specific Gravity, Urine: 1.01 (ref 1.005–1.030)
pH: 7 (ref 5.0–8.0)

## 2019-12-20 LAB — MAGNESIUM: Magnesium: 2 mg/dL (ref 1.7–2.4)

## 2019-12-20 MED ORDER — LACTATED RINGERS IV BOLUS
1000.0000 mL | Freq: Once | INTRAVENOUS | Status: AC
  Administered 2019-12-20: 1000 mL via INTRAVENOUS

## 2019-12-20 NOTE — Discharge Instructions (Addendum)
Your CT scan and lab work looked very reassuring today.  I suspect some of your symptoms are due to your chemotherapy as well as dehydration.  You were given 1 L of fluid here.  Call Dr. Mike Gip in the morning to discuss additional medication changes and I would recommend close follow-up this week.

## 2019-12-20 NOTE — ED Triage Notes (Signed)
First nurse note- here for dizziness and nausea.  CA pt. Will place in family room after triage. Ambulatory.

## 2019-12-20 NOTE — ED Triage Notes (Signed)
Pt arrived via POV with reports of feeling weak and dizzy that started this morning that progressed throughout the day.  Pt has Stage 2b R Breast CA under going Chemo, started new chemo treatment on Tuesday.   Pt c/o feeling "out of it today"  Decreased PO intake. Supposed to have appt tomorrow for fluids.  Pt states she took nausea meds prior to arrival.

## 2019-12-20 NOTE — ED Provider Notes (Signed)
[facility] Emergency Department Provider Note  ____________________________________________   First MD Initiated Contact with Patient 12/20/19 1840     (approximate)  I have reviewed the triage vital signs and the nursing notes.   HISTORY  Chief Complaint Weakness    HPI Heather Bell is a 44 y.o. female with past medical history as below including stage IIb breast cancer here with generalized weakness.  The patient recently started a new Adriamycin chemo regiment this week, and also received Neupogen for the first time.  Over the last several days, she has had increasing fatigue, as well as sensation that she is "disoriented."  No focal weakness.  No headaches.  No photophobia.  She has felt somewhat off balance, though on further questioning this seems somewhat with standing and not constant.  No double vision.  No pain.  No known fevers.  She has had no cough or shortness of breath.  No urinary symptoms.  She called her oncology clinic who told her to come to the ED today.  She states she feels very weak but otherwise is without complaints.  She was scheduled to get fluids in clinic tomorrow due to poor appetite related to her chemotherapy.        Past Medical History:  Diagnosis Date  . Allergic rhinitis   . Anxiety   . Bipolar disorder ([place])    depression  . Cancer [facility])    breast right  . Depression   . Diabetes ([place])   . GERD (gastroesophageal reflux disease)   . Hyperlipidemia     Patient Active Problem List   Diagnosis Date Noted  . Cardiomyopathy ([place]) 12/12/2019  . Dehydration 11/24/2019  . Acute pain of left shoulder 11/24/2019  . Orthostatic hypotension 10/12/2019  . 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 Malignant neoplasm of breast in female, estrogen receptor negative ([place]) 09/28/2019  . Hypokalemia 09/15/2019  . Hypocalcemia 09/15/2019  . Normocytic anemia 09/15/2019  . B12 deficiency 09/15/2019  . Iron deficiency  anemia 09/15/2019  . Encounter for antineoplastic chemotherapy 08/25/2019  . Goals of care, counseling/discussion 08/17/2019  . Weight loss 07/31/2019  . Invasive carcinoma of breast ([place]) 07/30/2019  . H/O diarrhea 01/01/2017    Past Surgical History:  Procedure Laterality Date  . BREAST BIOPSY Right 07/22/2019   mass bx, path pending, heart marker  . BREAST BIOPSY Right 07/22/2019   LN bx, path pending,  butterfly hydromarker  . IMAGE GUIDED SINUS SURGERY    . KNEE SURGERY    . NASAL SEPTUM SURGERY    . PORTACATH PLACEMENT N/A 08/07/2019   Procedure: INSERTION PORT-A-CATH;  Surgeon: Herbert Pun, MD;  Location: [facility] ORS;  Service: General;  Laterality: N/A;  . TONSILLECTOMY      Prior to Admission medications   Medication Sig Start Date End Date Taking? Authorizing Provider  amphetamine-dextroamphetamine (ADDERALL XR) 25 MG 24 hr capsule Take 25 mg by mouth daily. 06/29/19  Yes [provider]  cetirizine (ZYRTEC) 10 MG tablet Take 10 mg by mouth daily.  04/20/08  Yes [provider]  dexamethasone (DECADRON) 4 MG tablet Take 2 tablets by mouth once a day on the day after chemotherapy then 2 tablets two times a day for 2 days.  Take with food. 12/15/19  Yes Corcoran, Drue Second, MD  doxepin (SINEQUAN) 25 MG capsule Take 25 mg by mouth at bedtime.  07/04/19  Yes [provider]  lamoTRIgine (LAMICTAL) 200 MG tablet Take 200 mg  by mouth at bedtime.    Yes [provider]  metFORMIN (GLUCOPHAGE-XR) 500 MG 24 hr tablet Take 1,000 mg by mouth 2 (two) times daily. 07/04/19  Yes [provider]  NORLYDA 0.35 MG tablet Take 1 tablet by mouth daily. 10/29/19  Yes [provider]  omeprazole (PRILOSEC) 20 MG capsule Take 20 mg by mouth daily.  05/19/19  Yes [provider]  potassium chloride (KLOR-CON) 10 MEQ tablet Take 1 tablet (10 mEq total) by mouth as directed. Take 1 tablet daily as directed by MD. 11/10/19  Yes Lequita Asal, MD  QUEtiapine (SEROQUEL XR) 300 MG 24 hr tablet Take 300 mg by mouth at bedtime.   Yes [provider]  simvastatin (ZOCOR) 20 MG tablet Take 20 mg by mouth daily.   Yes [provider]  topiramate (TOPAMAX) 100 MG tablet Take 50-100 mg by mouth See admin instructions. Take 100 mg in the morning and 50 mg at night   Yes [provider]  Calcium Carbonate-Vitamin D (CALCIUM 500 + D) 500-125 MG-UNIT TABS Take 1 tablet by mouth daily.    [provider]  ibuprofen (ADVIL) 200 MG tablet Take 400-800 mg by mouth every 6 (six) hours as needed for headache or moderate pain.    [provider]  lidocaine-prilocaine (EMLA) cream Apply topically over port-a-cath 30 minutes to 1 hour prior to treatment. 10/23/19   Lequita Asal, MD  lurasidone (LATUDA) 40 MG TABS tablet Take 40 mg by mouth at bedtime. Take with 60 mg to equal 100 mg at night    [provider]  Lurasidone HCl 60 MG TABS Take 60 mg by mouth at bedtime. Take with 40 mg to equal 100 mg at night    [provider]  mometasone (NASONEX) 50 MCG/ACT nasal spray Place 2 sprays into the nose daily as needed (allergies).     [provider]  Multiple Vitamin (MULTIVITAMIN WITH MINERALS) TABS tablet Take 1 tablet by mouth daily.    [provider]  ondansetron (ZOFRAN) 8 MG tablet Take 1 tablet (8 mg total) by mouth 2 (two) times daily as needed for refractory nausea / vomiting. Start on day 3 after carboplatin chemo. 08/17/19   Lequita Asal, MD  vitamin B-12 (CYANOCOBALAMIN) 1000 MCG tablet Take 1,000 mcg by mouth daily.    [provider]    Allergies Compazine [prochlorperazine edisylate], Morphine and related, Doxycycline, Fluticasone, and Levofloxacin  Family History  Problem Relation Age of Onset  . Lung cancer Father   . Heart attack Father   . Lung cancer Paternal Uncle   . Heart disease Paternal Uncle   . Lung cancer Paternal  Aunt     Social History Social History   Tobacco Use  . Smoking status: Never Smoker  . Smokeless tobacco: Never Used  Substance Use Topics  . Alcohol use: No    Alcohol/week: 0.0 standard drinks  . Drug use: No    Review of Systems  Review of Systems  Constitutional: Positive for fatigue. Negative for fever.  HENT: Negative for congestion and sore throat.   Eyes: Negative for visual disturbance.  Respiratory: Negative for cough and shortness of breath.   Cardiovascular: Negative for chest pain.  Gastrointestinal: Positive for nausea. Negative for abdominal pain, diarrhea and vomiting.  Genitourinary: Negative for flank pain.  Musculoskeletal: Negative for back pain and neck pain.  Skin: Negative for rash and wound.  Neurological: Positive for weakness.  Psychiatric/Behavioral:  Positive for confusion.  All other systems reviewed and are negative.    ____________________________________________  PHYSICAL EXAM:      VITAL SIGNS: ED Triage Vitals  Enc Vitals Group     BP 12/20/19 1550 112/77     Pulse Rate 12/20/19 1550 (!) 110     Resp 12/20/19 1550 18     Temp 12/20/19 1550 99 F (37.2 C)     Temp Source 12/20/19 1550 Oral     SpO2 12/20/19 1550 99 %     Weight 12/20/19 1551 133 lb (60.3 kg)     Height 12/20/19 1551 5\' 6"  (1.676 m)     Head Circumference --      Peak Flow --      Pain Score 12/20/19 1551 0     Pain Loc --      Pain Edu? --      Excl. in [place]? --      Physical Exam Vitals and nursing note reviewed.  Constitutional:      General: She is not in acute distress.    Appearance: She is well-developed.  HENT:     Head: Normocephalic and atraumatic.     Mouth/Throat:     Mouth: Mucous membranes are dry.  Eyes:     Conjunctiva/sclera: Conjunctivae normal.  Cardiovascular:     Rate and Rhythm: Normal rate and regular rhythm.     Heart sounds: Normal heart sounds.  Pulmonary:     Effort: Pulmonary effort is normal. No respiratory distress.      Breath sounds: No wheezing.  Abdominal:     General: There is no distension.  Musculoskeletal:     Cervical back: Neck supple.  Skin:    General: Skin is warm.     Capillary Refill: Capillary refill takes less than 2 seconds.     Findings: No rash.  Neurological:     Mental Status: She is alert and oriented to person, place, and time.     Motor: No abnormal muscle tone.     Comments: Neurological Exam:  Mental Status: Alert and oriented to person, place, and time. Attention and concentration normal. Speech clear. Recent memory is intact. Cranial Nerves: Visual fields grossly intact. EOMI and PERRLA. No nystagmus noted. Facial sensation intact at forehead, maxillary cheek, and chin/mandible bilaterally. No facial asymmetry or weakness. Hearing grossly normal. Uvula is midline, and palate elevates symmetrically. Normal SCM and trapezius strength. Tongue midline without fasciculations. Motor: Muscle strength 5/5 in proximal and distal UE and LE bilaterally. No pronator drift. Muscle tone normal. Sensation: Intact to light touch in upper and lower extremities distally bilaterally.  Gait: Normal without ataxia. Coordination: Normal FTN bilaterally.          ____________________________________________   LABS (all labs ordered are listed, but only abnormal results are displayed)  Labs Reviewed  BASIC METABOLIC PANEL - Abnormal; Notable for the following components:      Result Value   Glucose, Bld 120 (*)    All other components within normal limits  CBC - Abnormal; Notable for the following components:   RBC 3.61 (*)    Hemoglobin 11.3 (*)    HCT 35.0 (*)    Platelets 147 (*)    All other components within normal limits  URINALYSIS, COMPLETE (UACMP) WITH MICROSCOPIC - Abnormal; Notable for the following components:   Hgb urine dipstick TRACE (*)    Leukocytes,Ua TRACE (*)    Bacteria, UA FEW (*)    All other components  within normal limits  HEPATIC FUNCTION PANEL - Abnormal;  Notable for the following components:   AST 13 (*)    All other components within normal limits  MAGNESIUM    ____________________________________________  EKG: Sinus tachycardia, ventricular rate 108.  PR 154, QRS 78, QTc 434.  No acute ST elevations or depressions.  No EKG evidence of acute ischemia or infarct. ________________________________________  RADIOLOGY All imaging, including plain films, CT scans, and ultrasounds, independently reviewed by me, and interpretations confirmed via formal radiology reads.  ED MD interpretation:   Chest x-ray: No acute abnormality  Official radiology report(s): DG Chest 2 View  Result Date: 12/20/2019 CLINICAL DATA:  Undergoing chemotherapy. Feeling weak and dizzy. EXAM: CHEST - 2 VIEW COMPARISON:  Chest radiograph 08/07/2019 FINDINGS: The cardiomediastinal contours are within normal limits. The left chest port is unchanged in position. The lungs are clear. No pneumothorax or pleural effusion. No acute finding in the visualized skeleton. IMPRESSION: No acute cardiopulmonary process. Electronically Signed   By: Audie Pinto M.D.   On: 12/20/2019 19:51   CT Head Wo Contrast  Result Date: 12/20/2019 CLINICAL DATA:  44 year old female with headache. EXAM: CT HEAD WITHOUT CONTRAST TECHNIQUE: Contiguous axial images were obtained from the base of the skull through the vertex without intravenous contrast. COMPARISON:  None. FINDINGS: Brain: The ventricles and sulci appropriate size for patient's age. The gray-white matter discrimination is preserved. There is no acute intracranial hemorrhage. No mass effect or midline shift no extra-axial fluid collection. Vascular: No hyperdense vessel or unexpected calcification. Skull: Normal. Negative for fracture or focal lesion. Sinuses/Orbits: No acute finding. Other: None IMPRESSION: Unremarkable noncontrast CT of the brain. Electronically Signed   By: Anner Crete M.D.   On: 12/20/2019 20:43     ____________________________________________  PROCEDURES   Procedure(s) performed (including Critical Care):  Procedures  ____________________________________________  INITIAL IMPRESSION / MDM / [place] / ED COURSE  As part of my medical decision making, I reviewed the following data within the [place] notes reviewed and incorporated, Old chart reviewed, Notes from prior ED visits, and [place] Controlled Substance Database       *Heather Bell was evaluated in Emergency Department on 12/20/2019 for the symptoms described in the history of present illness. She was evaluated in the context of the global COVID-19 pandemic, which necessitated consideration that the patient might be at risk for infection with the SARS-CoV-2 virus that causes COVID-19. Institutional protocols and algorithms that pertain to the evaluation of patients at risk for COVID-19 are in a state of rapid change based on information released by regulatory bodies including the CDC and federal and state organizations. These policies and algorithms were followed during the patient's care in the ED.  Some ED evaluations and interventions may be delayed as a result of limited staffing during the pandemic.*     Medical Decision Making: 44 year old female here with generalized weakness and disorientation in the setting of chemotherapy for lung cancer.  No focal neurological deficits on examination and I reviewed her recent PET scan, which shows no brain metastases.  Her neurological exam is unremarkable.  CT head shows no evidence of mass, edema, stroke, or bleed.  I suspect her symptoms are due to generalized weakness related to her chemotherapy with dehydration.  She is afebrile, has a normal chest x-ray and urine, and I see no evidence to suggest occult infection.  Electrolytes are largely acceptable.  I do not feel this is related to  her recent new medications, and I discussed with Dr. Janese Banks, who  feels that if CT head and neuro evaluation is unremarkable, patient can be safely followed up closely in clinic.  She will pass this to her primary oncologist Dr. Mike Gip.  Patient feels better with fluids.  Will discharge with close follow-up.  ____________________________________________  FINAL CLINICAL IMPRESSION(S) / ED DIAGNOSES  Final diagnoses:  Generalized weakness  Dehydration     MEDICATIONS GIVEN DURING THIS VISIT:  Medications  lactated ringers bolus 1,000 mL (1,000 mLs Intravenous New Bag/Given 12/20/19 2026)     ED Discharge Orders    None       Note:  This document was prepared using Dragon voice recognition software and may include unintentional dictation errors.   Duffy Bruce, MD 12/20/19 2149

## 2019-12-21 ENCOUNTER — Inpatient Hospital Stay: Payer: BC Managed Care – PPO | Attending: Hematology and Oncology

## 2019-12-21 ENCOUNTER — Telehealth: Payer: Self-pay

## 2019-12-21 VITALS — BP 91/66 | HR 121 | Temp 98.6°F | Resp 18

## 2019-12-21 DIAGNOSIS — C50511 Malignant neoplasm of lower-outer quadrant of right female breast: Secondary | ICD-10-CM | POA: Diagnosis not present

## 2019-12-21 DIAGNOSIS — Z5111 Encounter for antineoplastic chemotherapy: Secondary | ICD-10-CM | POA: Insufficient documentation

## 2019-12-21 DIAGNOSIS — Z7689 Persons encountering health services in other specified circumstances: Secondary | ICD-10-CM | POA: Diagnosis not present

## 2019-12-21 DIAGNOSIS — D509 Iron deficiency anemia, unspecified: Secondary | ICD-10-CM

## 2019-12-21 DIAGNOSIS — Z170 Estrogen receptor positive status [ER+]: Secondary | ICD-10-CM | POA: Diagnosis not present

## 2019-12-21 DIAGNOSIS — E860 Dehydration: Secondary | ICD-10-CM | POA: Diagnosis not present

## 2019-12-21 MED ORDER — SODIUM CHLORIDE 0.9 % IV SOLN
Freq: Once | INTRAVENOUS | Status: AC
  Filled 2019-12-21: qty 250

## 2019-12-21 MED ORDER — SODIUM CHLORIDE 0.9% FLUSH
10.0000 mL | Freq: Once | INTRAVENOUS | Status: AC | PRN
  Administered 2019-12-21: 10 mL
  Filled 2019-12-21: qty 10

## 2019-12-21 MED ORDER — HEPARIN SOD (PORK) LOCK FLUSH 100 UNIT/ML IV SOLN
500.0000 [IU] | Freq: Once | INTRAVENOUS | Status: AC | PRN
  Administered 2019-12-21: 500 [IU]
  Filled 2019-12-21: qty 5

## 2019-12-21 NOTE — Telephone Encounter (Signed)
Spoke with this patient early this AM about 9:30 AM and she refused to come in at that time for Fluids. The called back to the office about 11:00 and reports she was feeling not to well and thought she will come in for fluids today. The patient was informed to come in at that time. The patient was understanding and agreeable.

## 2019-12-23 ENCOUNTER — Encounter: Payer: Self-pay | Admitting: Hematology and Oncology

## 2019-12-23 ENCOUNTER — Other Ambulatory Visit: Payer: Self-pay

## 2019-12-23 NOTE — Progress Notes (Signed)
[facility]  [address], [address] [place], [place] [address] Phone: [phone]  Fax: [phone]   Clinic Day:  12/24/2019  Referring physician: [facility] [place]  Chief Complaint: Heather Bell is a 44 y.o. female with clinical stage IIBtriple negativerightbreast cancer who is seen for nadir assessment on day 10 s/p cycle #1 AC.  HPI: The patient was last seen in the medical oncology clinic on 12/15/2019. At that time, she was sad secondary to the loss of her dog.  She was dehydrated. She denied any nausea, vomiting, diarrhea or neuropathy.  Hematocrit was 34.1, hemoglobin 11.2, platelets 201,000, WBC 5,200. Creatinine was 0.76 and magnesium 1.9. She received cycle #1 AC followed by Fulphila (pegfilgrastim-jmdb) on 12/16/2019.  She received IVF.  She was seen in the [facility] ER on 12/20/2019 for weakness, fatigue, and disorientation.  She felt off balance.  Hematocrit was 35.0, hemoglobin 11.3, MCV 97, platelets 147,000, WBC 8400.  Creatinine was 0.59.  CXR and head CT were unremarkable.  She received IVF.  She received additional IVF on 12/21/2019 in the oncology clinic.  Symptomatically, she has felt "not great". Yesterday was pretty bad for her. She reports an increase in weakness and shortness of breath on exertion. She feels lightheaded and dizzy upon standing. The patient would like to get IV fluids today.  She tolerated the treatment well. She took Decadron as directed after treatment. She felt about the same with Decadron. By Friday, her vision started to get "funny". She can not see at a far distance. She notes being unable to read the TV guide screen. She had some glasses to help with her distance vision. She uses these glasses to drive at night but now she will use them during the day as well. On Sunday, she work up and felt lightheaded, nauseous and "out of it". She felt like she was there but was not there. She was feeling very off and went to the  ER. She was given IV fluids in the ER.  She had normal scans and labs. She felt better after IV fluids. She is struggling with drinking fluids. She reports it is hard to drink fluids, but she is trying. She would like to get IV fluids on Monday, Wednesday, and Friday. She would like to have her AC every 3 weeks instead of every 2 weeks.   She denies any diarrhea or vomiting. She has had on and off nausea. She does not take any nausea medication before eating.  She notes some chills and feeling colder more than ususal. She has a decreased appetite and early satiety. She has more frequent bowels; her stool is hard. She notes having blood in her stool which is secondary to her hemorrhoids. Her heartburn is controlled with medication. Her left shoulder continues to bother her and she would like to have another steroid injection.    Past Medical History:  Diagnosis Date  . Allergic rhinitis   . Anxiety   . Bipolar disorder ([place])    depression  . Cancer [facility])    breast right  . Depression   . Diabetes ([place])   . GERD (gastroesophageal reflux disease)   . Hyperlipidemia     Past Surgical History:  Procedure Laterality Date  . BREAST BIOPSY Right 07/22/2019   mass bx, path pending, heart marker  . BREAST BIOPSY Right 07/22/2019   LN bx, path pending,  butterfly hydromarker  . IMAGE GUIDED SINUS SURGERY    . KNEE SURGERY    .  NASAL SEPTUM SURGERY    . PORTACATH PLACEMENT N/A 08/07/2019   Procedure: INSERTION PORT-A-CATH;  Surgeon: Herbert Pun, MD;  Location: [facility] ORS;  Service: General;  Laterality: N/A;  . TONSILLECTOMY      Family History  Problem Relation Age of Onset  . Lung cancer Father   . Heart attack Father   . Lung cancer Paternal Uncle   . Heart disease Paternal Uncle   . Lung cancer Paternal Aunt     Social History:  reports that she has never smoked. She has never used smokeless tobacco. She reports that she does not drink alcohol or use drugs.  Deniesany  exposure to radiation or toxins.She has 2 children (65 year old daughter and 78 year old son).She use to work inthefast foodindustry. She is currently not working. Her husbandisMichael.[name] used to travel a lot for his job.Her husband found a new job closer to home in [place], [place]. The patient is alone today.  Allergies:  Allergies  Allergen Reactions  . Compazine [Prochlorperazine Edisylate] Other (See Comments)    Acute dystonic reaction  . Morphine And Related Shortness Of Breath  . Doxycycline Other (See Comments)    "made all my symptoms worse"   . Fluticasone Other (See Comments)    Doesn't work  . Levofloxacin Other (See Comments)    "made all my symptoms worse"     Current Medications: Current Outpatient Medications  Medication Sig Dispense Refill  . amphetamine-dextroamphetamine (ADDERALL XR) 25 MG 24 hr capsule Take 25 mg by mouth daily.    . Calcium Carbonate-Vitamin D (CALCIUM 500 + D) 500-125 MG-UNIT TABS Take 1 tablet by mouth daily.    . cetirizine (ZYRTEC) 10 MG tablet Take 10 mg by mouth daily.     Marland Kitchen dexamethasone (DECADRON) 4 MG tablet Take 2 tablets by mouth once a day on the day after chemotherapy then 2 tablets two times a day for 2 days.  Take with food. 20 tablet 1  . doxepin (SINEQUAN) 25 MG capsule Take 25 mg by mouth at bedtime.     Marland Kitchen ibuprofen (ADVIL) 200 MG tablet Take 400-800 mg by mouth every 6 (six) hours as needed for headache or moderate pain.    Marland Kitchen lamoTRIgine (LAMICTAL) 200 MG tablet Take 200 mg by mouth at bedtime.     . lidocaine-prilocaine (EMLA) cream Apply topically over port-a-cath 30 minutes to 1 hour prior to treatment. 30 g 2  . lurasidone (LATUDA) 40 MG TABS tablet Take 40 mg by mouth at bedtime. Take with 60 mg to equal 100 mg at night    . Lurasidone HCl 60 MG TABS Take 60 mg by mouth at bedtime. Take with 40 mg to equal 100 mg at night    . metFORMIN (GLUCOPHAGE-XR) 500 MG 24 hr tablet Take 1,000 mg by mouth 2 (two) times daily.     . mometasone (NASONEX) 50 MCG/ACT nasal spray Place 2 sprays into the nose daily as needed (allergies).     . Multiple Vitamin (MULTIVITAMIN WITH MINERALS) TABS tablet Take 1 tablet by mouth daily.    . NORLYDA 0.35 MG tablet Take 1 tablet by mouth daily.    Marland Kitchen omeprazole (PRILOSEC) 20 MG capsule Take 20 mg by mouth daily.     . ondansetron (ZOFRAN) 8 MG tablet Take 1 tablet (8 mg total) by mouth 2 (two) times daily as needed for refractory nausea / vomiting. Start on day 3 after carboplatin chemo. 30 tablet 1  . potassium chloride (  KLOR-CON) 10 MEQ tablet Take 1 tablet (10 mEq total) by mouth as directed. Take 1 tablet daily as directed by MD. 30 tablet 0  . QUEtiapine (SEROQUEL XR) 300 MG 24 hr tablet Take 300 mg by mouth at bedtime.    . simvastatin (ZOCOR) 20 MG tablet Take 20 mg by mouth daily.    Marland Kitchen topiramate (TOPAMAX) 100 MG tablet Take 50-100 mg by mouth See admin instructions. Take 100 mg in the morning and 50 mg at night    . vitamin B-12 (CYANOCOBALAMIN) 1000 MCG tablet Take 1,000 mcg by mouth daily.     No current facility-administered medications for this visit.   Facility-Administered Medications Ordered in Other Visits  Medication Dose Route Frequency Provider Last Rate Last Admin  . heparin lock flush 100 unit/mL  500 Units Intravenous Once Briant Angelillo C, MD      . sodium chloride flush (NS) 0.9 % injection 10 mL  10 mL Intravenous PRN Lequita Asal, MD   10 mL at 12/24/19 1884    Review of Systems  Constitutional: Positive for chills and weight loss (3 lbs). Negative for diaphoresis, fever and malaise/fatigue.       "Not great". Feeling "funny and out of it".  HENT: Negative for congestion, ear discharge, ear pain, hearing loss, nosebleeds, sinus pain, sore throat and tinnitus.        Nail discoloration.  Eyes: Negative for blurred vision.       Visual changes; can not see far away.  Respiratory: Positive for shortness of breath (on exertion). Negative for  cough, hemoptysis and sputum production.   Cardiovascular: Negative for chest pain, palpitations and leg swelling.  Gastrointestinal: Positive for blood in stool (hemorrhoids) and nausea (on and off). Negative for abdominal pain, constipation, diarrhea, heartburn (reflux; on antacids; better), melena and vomiting.       Struggles to drink fluids. Decreased appetite. Early satiety. Frequent bowels; hard stool.  Genitourinary: Negative for dysuria, frequency, hematuria and urgency.       Premenopausal.  Musculoskeletal: Positive for joint pain (left shoulder). Negative for back pain, myalgias and neck pain.       LEFT shoulder decreased ROM.  Skin: Negative for itching and rash.       RIGHT breast lump barely palpable.  Neurological: Positive for dizziness (upon standing) and weakness. Negative for tingling, sensory change and headaches.       Lightheaded.  Endo/Heme/Allergies: Does not bruise/bleed easily.       Type II diabetes.  Psychiatric/Behavioral: Negative for depression and memory loss. The patient is not nervous/anxious and does not have insomnia.   All other systems reviewed and are negative.   Performance status (ECOG): 1  Vitals Blood pressure 91/62, pulse (!) 104, temperature (!) 97.2 F (36.2 C), temperature source Tympanic, resp. rate 20, height '5\' 6"'  (1.676 m), weight 134 lb 4.2 oz (60.9 kg), SpO2 100 %.   Physical Exam  Constitutional: She is oriented to person, place, and time. She appears well-developed and well-nourished. No distress.  HENT:  Head: Normocephalic and atraumatic.  Mouth/Throat: Oropharynx is clear and moist. No oropharyngeal exudate.  Blue scarf. Mask.  Eyes: Pupils are equal, round, and reactive to light. Conjunctivae and EOM are normal. No scleral icterus.  Brown eyes.  Cardiovascular: Normal rate, regular rhythm and normal heart sounds.  No murmur heard. Pulmonary/Chest: Effort normal and breath sounds normal. No respiratory distress. She has no  wheezes. She has no rales. She exhibits no tenderness.  Abdominal:  Soft. Bowel sounds are normal. She exhibits no distension and no mass. There is no abdominal tenderness. There is no rebound and no guarding.  Musculoskeletal:        General: No tenderness or edema. Normal range of motion.     Cervical back: Normal range of motion and neck supple.  Lymphadenopathy:       Head (right side): No preauricular, no posterior auricular and no occipital adenopathy present.       Head (left side): No preauricular, no posterior auricular and no occipital adenopathy present.    She has no cervical adenopathy.    She has no axillary adenopathy.       Right: No inguinal and no supraclavicular adenopathy present.       Left: No inguinal and no supraclavicular adenopathy present.  Neurological: She is alert and oriented to person, place, and time.  Skin: Skin is warm and dry. She is not diaphoretic.  Psychiatric: She has a normal mood and affect. Her behavior is normal. Judgment and thought content normal.  Nursing note and vitals reviewed.   Infusion on 12/24/2019  Component Date Value Ref Range Status  . Sodium 12/24/2019 139  135 - 145 mmol/L Final  . Potassium 12/24/2019 3.6  3.5 - 5.1 mmol/L Final  . Chloride 12/24/2019 108  98 - 111 mmol/L Final  . CO2 12/24/2019 23  22 - 32 mmol/L Final  . Glucose, Bld 12/24/2019 98  70 - 99 mg/dL Final   Glucose reference range applies only to samples taken after fasting for at least 8 hours.  . BUN 12/24/2019 12  6 - 20 mg/dL Final  . Creatinine, Ser 12/24/2019 0.63  0.44 - 1.00 mg/dL Final  . Calcium 12/24/2019 9.1  8.9 - 10.3 mg/dL Final  . GFR calc non Af Amer 12/24/2019 >60  >60 mL/min Final  . GFR calc Af Amer 12/24/2019 >60  >60 mL/min Final  . Anion gap 12/24/2019 8  5 - 15 Final   Performed at [facility], [address] [place], [place] [address]  . Magnesium 12/24/2019 1.9  1.7 - 2.4 mg/dL Final   Performed at [facility], [address] [place], [place] [address]    Assessment:  KORENA NASS is a 44 y.o. female with clinical T1cN1triple negativerightbreast cancers/p ultrasound guided core biopsy. Pathology revealeda grade IIIinvasive mammary carcinoma, no special type of the right breast. Specimen was 10 mm.Ductal carcinoma in situ(DCIS)was not identified.Right axillarylymph nodewas positive for metastatic carcinoma.Tumor was ER - (<1%), PR - (<1%), and Her2/neu -.  Bilateral mammogram and right unilateral ultrasoundon 07/10/2019 revealed a 1.7 x 1.6 x 1.6 cmmass at the7 o'clock region of the right breast 7 cm from the nipple. There was an abnormal1.4 x 0.7 x 0.9 cmaxillary lymph node. Therewas a second3 mmaxillary lymph node with minimal focal cortical thickening.An ultrasound-guided core biopsies of the mass and the enlarged right axillary lymph node was recommended. BI-RADS category 5.  PET scanon 08/11/2019 showed a hypermetabolic mass in the inferior RIGHT breast consistentwith herbreast carcinoma. There was noevidence hypermetabolic nodal metastasis.There was no distant metastatic disease.There was nopulmonary metastasis or skeletal metastasis.  Echoon 12/24/2020revealedan ejection fraction of50 to 55%. The left ventricle demonstrated global hypokinesis.MUGAon 08/24/2019 revealed an EF of 60%.MUGA on 12/08/2019 revealed an EF of 57.4%. She is followed by Dr End.  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  She is  s/p 12 weeksof Taxol + carboplatin(08/25/2019 - 11/24/2019).Carboplatin was held on week #7 and 8 secondary to marginal counts. She received Granix on 10/28/2019 and 11/20/2019.  She is day 10 s/p cycle #1 AC (12/15/2019) with Fulphila (pegfilgrastim-jmdb) support.  Breast masswas larger after initiation of treatment felt  secondary to post biopsy hematoma. Right breast ultrasoundon 09/07/2019 revealed the mass in the 7 o'clock position contained cystic components and measured 3 cm (compared to 1.7 cm on 07/10/2019). The biopsied lymph node with 3 mm cortical thickening in the axilla was similar. Right breast ultrasoundon 10/09/2019 revealed interval decrease in size of RIGHT breast mass (1.4 x 1.7 x 1.4 cm). Mass was less well defined and associated with parenchymal edema.  Right breast ultrasound on 12/03/2019 revealed a 1.3 x 1.5 x 1.2 cm irregular hypoechoic mass right breast 7 o'clock position 7 cm from nipple.   She is premenopausal. She stopped taking birth control pills on 08/17/2019.  She has iron deficiency. Ferritin was 21 with an iron saturation of 7% and a TIBC 343 on 09/08/2019.She received Venoferweekly x 3 (09/18/2019 - 10/06/2019).  She has B12 deficiency. B12 was 240 on 01/19/2021and 680 on 11/24/2019.She is on oral B12.  She has recurrent orthostatic hypotension. Fluid intake is marginal. AM cortisol was 5.3 (low) on 09/22/2019 suggestive of adrenal insufficiency.ACTH stim testwas negative.  Symptomatically, she remains fatigued.  Fluid intake remains poor.  She is orthostatic.  Plan: 1.   Labs today: CBC with diff, BMP, cortisol. 2.Clinical stageIIB triple negativeright breast cancer PET scan- no evidence of metastatic disease. She is s/p 12 weeks of neoadjuvant carboplatin and Taxol (last 11/24/2019).  She is day 10 s/p cycle #1 AC (12/15/2019).   She has had increased weakness, fatigue and nausea.  Counts today include a hemoglobin 10.3, platelets 92,000, WBC 2200 (ANC 600).   Review neutropenic precautions.  Discuss patient's thoughts about treatment.   She would like to receive treatment every 3 weeks. 3. Chemotherapy induced anemia Hematocrit 30.7, hemoglobin10.3, MCV93.3today. Ferritin was 89 on  11/24/2019.  Continue to monitor. 4. B12 deficiency B12 was 240 on 01/19/2021and 680 on 11/24/2019. Continue oral B12. 5.Dehydration  Patient has poor fluid intake.  Patient is orthostatic today.  IV fluids today.  Discuss plans for fluids 3 times a week during [facility] chemotherapy. 6. RTC tomorrow for IVF. 7.   RTC Tues, Wed, Friday next week for IVF. 8.   Add labs on Tuesday 05/11- CBC with diff, BMP. 9.   RTC on 01/05/2020 for MD assessment, labs (CBC with diff, CMP, Mg), and cycle #2 AC  I discussed the assessment and treatment plan with the patient.  The patient was provided an opportunity to ask questions and all were answered.  The patient agreed with the plan and demonstrated an understanding of the instructions.  The patient was advised to call back if the symptoms worsen or if the condition fails to improve as anticipated.  I provided 23 minutes of face-to-face time during this this encounter and > 50% was spent counseling as documented under my assessment and plan.    Lequita Asal, MD, PhD    12/24/2019, 9:19 AM  I, Selena Batten, am acting as scribe for [employer]. Mike Gip, MD, PhD.  I, Latisa Belay C. Mike Gip, MD, have reviewed the above documentation for accuracy and completeness, and I agree with the above.

## 2019-12-23 NOTE — Progress Notes (Signed)
The patient reports increase weakness and increase SOB when she get up and move around. 3 lb weight loss today. The patient name and DOB has been verified by phone today.

## 2019-12-24 ENCOUNTER — Inpatient Hospital Stay: Payer: BC Managed Care – PPO

## 2019-12-24 ENCOUNTER — Encounter: Payer: Self-pay | Admitting: Hematology and Oncology

## 2019-12-24 ENCOUNTER — Inpatient Hospital Stay (HOSPITAL_BASED_OUTPATIENT_CLINIC_OR_DEPARTMENT_OTHER): Payer: BC Managed Care – PPO | Admitting: Hematology and Oncology

## 2019-12-24 VITALS — BP 91/62 | HR 104 | Temp 97.2°F | Resp 20 | Ht 66.0 in | Wt 134.3 lb

## 2019-12-24 VITALS — BP 93/65 | HR 93 | Temp 98.1°F | Resp 18

## 2019-12-24 DIAGNOSIS — Z171 Estrogen receptor negative status [ER-]: Secondary | ICD-10-CM

## 2019-12-24 DIAGNOSIS — E860 Dehydration: Secondary | ICD-10-CM

## 2019-12-24 DIAGNOSIS — R7989 Other specified abnormal findings of blood chemistry: Secondary | ICD-10-CM

## 2019-12-24 DIAGNOSIS — D509 Iron deficiency anemia, unspecified: Secondary | ICD-10-CM | POA: Diagnosis not present

## 2019-12-24 DIAGNOSIS — E2740 Unspecified adrenocortical insufficiency: Secondary | ICD-10-CM

## 2019-12-24 DIAGNOSIS — C50919 Malignant neoplasm of unspecified site of unspecified female breast: Secondary | ICD-10-CM

## 2019-12-24 DIAGNOSIS — C50511 Malignant neoplasm of lower-outer quadrant of right female breast: Secondary | ICD-10-CM | POA: Diagnosis not present

## 2019-12-24 DIAGNOSIS — Z5111 Encounter for antineoplastic chemotherapy: Secondary | ICD-10-CM

## 2019-12-24 DIAGNOSIS — E538 Deficiency of other specified B group vitamins: Secondary | ICD-10-CM

## 2019-12-24 LAB — BASIC METABOLIC PANEL
Anion gap: 8 (ref 5–15)
BUN: 12 mg/dL (ref 6–20)
CO2: 23 mmol/L (ref 22–32)
Calcium: 9.1 mg/dL (ref 8.9–10.3)
Chloride: 108 mmol/L (ref 98–111)
Creatinine, Ser: 0.63 mg/dL (ref 0.44–1.00)
GFR calc Af Amer: >60 mL/min (ref >60)
GFR calc non Af Amer: >60 mL/min (ref >60)
Glucose, Bld: 98 mg/dL (ref 70–99)
Potassium: 3.6 mmol/L (ref 3.5–5.1)
Sodium: 139 mmol/L (ref 135–145)

## 2019-12-24 LAB — CBC WITH DIFFERENTIAL/PLATELET
Abs Immature Granulocytes: 0.01 10*3/uL (ref 0.00–0.07)
Basophils Absolute: 0.1 10*3/uL (ref 0.0–0.1)
Basophils Relative: 2 %
Eosinophils Absolute: 0.1 10*3/uL (ref 0.0–0.5)
Eosinophils Relative: 5 %
HCT: 30.7 % — ABNORMAL LOW (ref 36.0–46.0)
Hemoglobin: 10.3 g/dL — ABNORMAL LOW (ref 12.0–15.0)
Immature Granulocytes: 1 %
Lymphocytes Relative: 56 %
Lymphs Abs: 1.2 10*3/uL (ref 0.7–4.0)
MCH: 31.3 pg (ref 26.0–34.0)
MCHC: 33.6 g/dL (ref 30.0–36.0)
MCV: 93.3 fL (ref 80.0–100.0)
Monocytes Absolute: 0.2 10*3/uL (ref 0.1–1.0)
Monocytes Relative: 10 %
Neutro Abs: 0.6 10*3/uL — ABNORMAL LOW (ref 1.7–7.7)
Neutrophils Relative %: 26 %
Platelets: 92 10*3/uL — ABNORMAL LOW (ref 150–400)
RBC Morphology: NONE SEEN
RBC: 3.29 MIL/uL — ABNORMAL LOW (ref 3.87–5.11)
RDW: 13.4 % (ref 11.5–15.5)
WBC: 2.2 10*3/uL — ABNORMAL LOW (ref 4.0–10.5)
nRBC: 0 % (ref 0.0–0.2)

## 2019-12-24 LAB — CORTISOL: Cortisol, Plasma: 9 ug/dL

## 2019-12-24 LAB — MAGNESIUM: Magnesium: 1.9 mg/dL (ref 1.7–2.4)

## 2019-12-24 MED ORDER — HEPARIN SOD (PORK) LOCK FLUSH 100 UNIT/ML IV SOLN
INTRAVENOUS | Status: AC
  Filled 2019-12-24: qty 5

## 2019-12-24 MED ORDER — HEPARIN SOD (PORK) LOCK FLUSH 100 UNIT/ML IV SOLN
500.0000 [IU] | Freq: Once | INTRAVENOUS | Status: AC
  Administered 2019-12-24: 500 [IU] via INTRAVENOUS
  Filled 2019-12-24: qty 5

## 2019-12-24 MED ORDER — SODIUM CHLORIDE 0.9% FLUSH
10.0000 mL | INTRAVENOUS | Status: DC | PRN
  Administered 2019-12-24: 10 mL via INTRAVENOUS
  Filled 2019-12-24: qty 10

## 2019-12-24 MED ORDER — SODIUM CHLORIDE 0.9 % IV SOLN
INTRAVENOUS | Status: DC
  Filled 2019-12-24 (×2): qty 250

## 2019-12-24 NOTE — Patient Instructions (Signed)
  Neutropenic precautions.  Call if fever, chills, or feeling sick.

## 2019-12-25 ENCOUNTER — Inpatient Hospital Stay: Payer: BC Managed Care – PPO

## 2019-12-25 ENCOUNTER — Other Ambulatory Visit: Payer: Self-pay | Admitting: Hematology and Oncology

## 2019-12-25 ENCOUNTER — Other Ambulatory Visit: Payer: Self-pay

## 2019-12-25 VITALS — BP 82/51 | HR 118 | Temp 97.6°F | Resp 18

## 2019-12-25 DIAGNOSIS — E860 Dehydration: Secondary | ICD-10-CM

## 2019-12-25 DIAGNOSIS — C50511 Malignant neoplasm of lower-outer quadrant of right female breast: Secondary | ICD-10-CM | POA: Diagnosis not present

## 2019-12-25 MED ORDER — SODIUM CHLORIDE 0.9% FLUSH
10.0000 mL | INTRAVENOUS | Status: DC | PRN
  Administered 2019-12-25: 10 mL via INTRAVENOUS
  Filled 2019-12-25: qty 10

## 2019-12-25 MED ORDER — HEPARIN SOD (PORK) LOCK FLUSH 100 UNIT/ML IV SOLN
500.0000 [IU] | Freq: Once | INTRAVENOUS | Status: AC
  Administered 2019-12-25: 500 [IU] via INTRAVENOUS
  Filled 2019-12-25: qty 5

## 2019-12-25 MED ORDER — SODIUM CHLORIDE 0.9 % IV SOLN
Freq: Once | INTRAVENOUS | Status: AC
  Filled 2019-12-25: qty 250

## 2019-12-25 MED ORDER — POTASSIUM CHLORIDE ER 10 MEQ PO TBCR: 10.0000 meq | EXTENDED_RELEASE_TABLET | ORAL | 0 refills | Status: DC

## 2019-12-29 ENCOUNTER — Inpatient Hospital Stay: Payer: BC Managed Care – PPO

## 2019-12-29 ENCOUNTER — Other Ambulatory Visit: Payer: Self-pay

## 2019-12-29 VITALS — BP 95/63 | HR 92 | Temp 97.9°F | Resp 18

## 2019-12-29 DIAGNOSIS — C50511 Malignant neoplasm of lower-outer quadrant of right female breast: Secondary | ICD-10-CM | POA: Diagnosis not present

## 2019-12-29 DIAGNOSIS — E860 Dehydration: Secondary | ICD-10-CM

## 2019-12-29 LAB — BASIC METABOLIC PANEL
Anion gap: 9 (ref 5–15)
BUN: 11 mg/dL (ref 6–20)
CO2: 22 mmol/L (ref 22–32)
Calcium: 8.9 mg/dL (ref 8.9–10.3)
Chloride: 109 mmol/L (ref 98–111)
Creatinine, Ser: 0.71 mg/dL (ref 0.44–1.00)
GFR calc Af Amer: >60 mL/min (ref >60)
GFR calc non Af Amer: >60 mL/min (ref >60)
Glucose, Bld: 118 mg/dL — ABNORMAL HIGH (ref 70–99)
Potassium: 3.5 mmol/L (ref 3.5–5.1)
Sodium: 140 mmol/L (ref 135–145)

## 2019-12-29 LAB — CBC WITH DIFFERENTIAL/PLATELET
Abs Immature Granulocytes: 0.34 10*3/uL — ABNORMAL HIGH (ref 0.00–0.07)
Basophils Absolute: 0.1 10*3/uL (ref 0.0–0.1)
Basophils Relative: 1 %
Eosinophils Absolute: 0.1 10*3/uL (ref 0.0–0.5)
Eosinophils Relative: 1 %
HCT: 30.7 % — ABNORMAL LOW (ref 36.0–46.0)
Hemoglobin: 10.1 g/dL — ABNORMAL LOW (ref 12.0–15.0)
Immature Granulocytes: 4 %
Lymphocytes Relative: 22 %
Lymphs Abs: 1.9 10*3/uL (ref 0.7–4.0)
MCH: 31.7 pg (ref 26.0–34.0)
MCHC: 32.9 g/dL (ref 30.0–36.0)
MCV: 96.2 fL (ref 80.0–100.0)
Monocytes Absolute: 0.7 10*3/uL (ref 0.1–1.0)
Monocytes Relative: 8 %
Neutro Abs: 5.5 10*3/uL (ref 1.7–7.7)
Neutrophils Relative %: 64 %
Platelets: 272 10*3/uL (ref 150–400)
RBC: 3.19 MIL/uL — ABNORMAL LOW (ref 3.87–5.11)
RDW: 14.6 % (ref 11.5–15.5)
WBC: 8.5 10*3/uL (ref 4.0–10.5)
nRBC: 0 % (ref 0.0–0.2)

## 2019-12-29 MED ORDER — HEPARIN SOD (PORK) LOCK FLUSH 100 UNIT/ML IV SOLN
500.0000 [IU] | Freq: Once | INTRAVENOUS | Status: AC
  Administered 2019-12-29: 500 [IU] via INTRAVENOUS
  Filled 2019-12-29: qty 5

## 2019-12-29 MED ORDER — SODIUM CHLORIDE 0.9 % IV SOLN
INTRAVENOUS | Status: DC
  Filled 2019-12-29 (×2): qty 250

## 2019-12-29 MED ORDER — SODIUM CHLORIDE 0.9% FLUSH
10.0000 mL | INTRAVENOUS | Status: DC | PRN
  Administered 2019-12-29: 10 mL via INTRAVENOUS
  Filled 2019-12-29: qty 10

## 2019-12-29 NOTE — Progress Notes (Signed)
Patient reports feeling better. Her Blood counts are good today. Instructed patient to keep her appointment for her steroid injection on her shoulder this week. Patient getting IVF's for dehydration / ortho static BP's.

## 2019-12-29 NOTE — Progress Notes (Signed)
Pharmacist Chemotherapy Monitoring - Follow Up Assessment    I verify that I have reviewed each item in the below checklist:  . Regimen for the patient is scheduled for the appropriate day and plan matches scheduled date. Marland Kitchen Appropriate non-routine labs are ordered dependent on drug ordered. . If applicable, additional medications reviewed and ordered per protocol based on lifetime cumulative doses and/or treatment regimen.   Plan for follow-up and/or issues identified: No . I-vent associated with next due treatment: No . MD and/or nursing notified: No  Heather Bell K 12/29/2019 3:53 PM

## 2019-12-30 ENCOUNTER — Inpatient Hospital Stay: Payer: BC Managed Care – PPO

## 2019-12-30 VITALS — BP 102/71 | HR 84 | Temp 98.4°F | Resp 17

## 2019-12-30 DIAGNOSIS — C50511 Malignant neoplasm of lower-outer quadrant of right female breast: Secondary | ICD-10-CM | POA: Diagnosis not present

## 2019-12-30 DIAGNOSIS — E860 Dehydration: Secondary | ICD-10-CM

## 2019-12-30 MED ORDER — HEPARIN SOD (PORK) LOCK FLUSH 100 UNIT/ML IV SOLN
500.0000 [IU] | Freq: Once | INTRAVENOUS | Status: AC
  Administered 2019-12-30: 500 [IU] via INTRAVENOUS
  Filled 2019-12-30: qty 5

## 2019-12-30 MED ORDER — SODIUM CHLORIDE 0.9 % IV SOLN
INTRAVENOUS | Status: DC
  Filled 2019-12-30 (×2): qty 250

## 2019-12-30 MED ORDER — SODIUM CHLORIDE 0.9% FLUSH
10.0000 mL | INTRAVENOUS | Status: DC | PRN
  Administered 2019-12-30: 10 mL via INTRAVENOUS
  Filled 2019-12-30: qty 10

## 2020-01-01 ENCOUNTER — Other Ambulatory Visit: Payer: Self-pay

## 2020-01-01 ENCOUNTER — Inpatient Hospital Stay: Payer: BC Managed Care – PPO

## 2020-01-01 DIAGNOSIS — C50511 Malignant neoplasm of lower-outer quadrant of right female breast: Secondary | ICD-10-CM | POA: Diagnosis not present

## 2020-01-01 DIAGNOSIS — E860 Dehydration: Secondary | ICD-10-CM

## 2020-01-01 MED ORDER — SODIUM CHLORIDE 0.9% FLUSH
10.0000 mL | INTRAVENOUS | Status: DC | PRN
  Filled 2020-01-01: qty 10

## 2020-01-01 MED ORDER — SODIUM CHLORIDE 0.9 % IV SOLN
INTRAVENOUS | Status: DC
  Filled 2020-01-01 (×2): qty 250

## 2020-01-01 MED ORDER — HEPARIN SOD (PORK) LOCK FLUSH 100 UNIT/ML IV SOLN
500.0000 [IU] | Freq: Once | INTRAVENOUS | Status: AC
  Administered 2020-01-01: 500 [IU] via INTRAVENOUS
  Filled 2020-01-01: qty 5

## 2020-01-04 NOTE — Progress Notes (Signed)
[facility]  [address], [address] [place],  [address] Phone: [phone]  Fax: [phone]   Clinic Day:  01/05/2020  Referring physician: [facility], [place]  Chief Complaint: Heather Bell is a 44 y.o. female with clinical stage IIBtriple negativerightbreast cancer who is seen for assessment prior to cycle #2 AC.   HPI: The patient was last seen in the medical oncology clinic on 12/24/2019. At that time, she was day 10 of cycle #1 AC.  She continued to be symptomatic due to dehydration.  Hematocrit was 30.7, hemoglobin 10.3, platelets 92,000, WBC 2,200 (ANC 600).  BMP was normal. Cortisol was 9.0.  She received IVF.  Patient received IVF's for dehydration on 12/29/2019, 12/30/2019, and 01/01/2020. Labs on 12/29/2019 revealed a hematocrit 30.7, hemoglobin 10.1, platelets 272,000, WBC 8,500 ([place] 5500). BMP was normal.   She saw Dr. Rosalia Hammers on 01/01/2020. She reported no improvement with meloxicam. She had no left shoulder pain at rest. She received a steroid injection. Recommendation included Tylenol, anti-inflammatories, topical pain cream, and ice/heat packs.   Symptomatically, patient states that she feels weak and tired today. She does not feel 100% back to normal yet. She gets tired easily. She is not active. She does not feel dizzy or lightheaded today. She is unsure if her shoulder injection has improved her pain yet. She has had some nausea. She is eating better. She is drinking 24 oz of fluid a day. Before treatment, she never drank more fluids. She does not have any leg swelling. She does not feel short of breath. She does sleep with her head propped up at night. She notes bruising easily. She has nail separation. Her vision is back to normal. Her stool is hard. Her right breast lump is barely palpable.   Sitting BP: 97/67 HR 106, standing BP: 90/65 HR 112.    Past Medical History:  Diagnosis Date  . Allergic rhinitis   . Anxiety    . Bipolar disorder ([place])    depression  . Cancer [facility])    breast right  . Depression   . Diabetes ([place])   . GERD (gastroesophageal reflux disease)   . Hyperlipidemia     Past Surgical History:  Procedure Laterality Date  . BREAST BIOPSY Right 07/22/2019   mass bx, path pending, heart marker  . BREAST BIOPSY Right 07/22/2019   LN bx, path pending,  butterfly hydromarker  . IMAGE GUIDED SINUS SURGERY    . KNEE SURGERY    . NASAL SEPTUM SURGERY    . PORTACATH PLACEMENT N/A 08/07/2019   Procedure: INSERTION PORT-A-CATH;  Surgeon: Herbert Pun, MD;  Location: [facility] ORS;  Service: General;  Laterality: N/A;  . TONSILLECTOMY      Family History  Problem Relation Age of Onset  . Lung cancer Father   . Heart attack Father   . Lung cancer Paternal Uncle   . Heart disease Paternal Uncle   . Lung cancer Paternal Aunt     Social History:  reports that she has never smoked. She has never used smokeless tobacco. She reports that she does not drink alcohol or use drugs. Deniesany exposure to radiation or toxins.She has 2 children (26 year old daughter and 53 year old son).She use to work inthefast foodindustry. She is currently not working. Her husbandisMichael.Michaelused totravel a lot for his job.Her husband found a new job closer to home in [place], [place]. The patient is alone today.  Allergies:  Allergies  Allergen Reactions  .  Compazine [Prochlorperazine Edisylate] Other (See Comments)    Acute dystonic reaction  . Morphine And Related Shortness Of Breath  . Doxycycline Other (See Comments)    "made all my symptoms worse"   . Fluticasone Other (See Comments)    Doesn't work  . Levofloxacin Other (See Comments)    "made all my symptoms worse"     Current Medications: Current Outpatient Medications  Medication Sig Dispense Refill  . amphetamine-dextroamphetamine (ADDERALL XR) 25 MG 24 hr capsule Take 25 mg by mouth daily.    . Calcium Carbonate-Vitamin D  (CALCIUM 500 + D) 500-125 MG-UNIT TABS Take 1 tablet by mouth daily.    . cetirizine (ZYRTEC) 10 MG tablet Take 10 mg by mouth daily.     Marland Kitchen dexamethasone (DECADRON) 4 MG tablet Take 2 tablets by mouth once a day on the day after chemotherapy then 2 tablets two times a day for 2 days.  Take with food. 20 tablet 1  . doxepin (SINEQUAN) 25 MG capsule Take 25 mg by mouth at bedtime.     Marland Kitchen ibuprofen (ADVIL) 200 MG tablet Take 400-800 mg by mouth every 6 (six) hours as needed for headache or moderate pain.    Marland Kitchen lamoTRIgine (LAMICTAL) 200 MG tablet Take 200 mg by mouth at bedtime.     . lidocaine-prilocaine (EMLA) cream Apply topically over port-a-cath 30 minutes to 1 hour prior to treatment. 30 g 2  . loratadine (CLARITIN) 10 MG tablet Take 10 mg by mouth daily.    Marland Kitchen lurasidone (LATUDA) 40 MG TABS tablet Take 40 mg by mouth at bedtime. Take with 60 mg to equal 100 mg at night    . Lurasidone HCl 60 MG TABS Take 60 mg by mouth at bedtime. Take with 40 mg to equal 100 mg at night    . metFORMIN (GLUCOPHAGE-XR) 500 MG 24 hr tablet Take 1,000 mg by mouth 2 (two) times daily.    . mometasone (NASONEX) 50 MCG/ACT nasal spray Place 2 sprays into the nose daily as needed (allergies).     . Multiple Vitamin (MULTIVITAMIN WITH MINERALS) TABS tablet Take 1 tablet by mouth daily.    Marland Kitchen omeprazole (PRILOSEC) 20 MG capsule Take 20 mg by mouth daily.     . ondansetron (ZOFRAN) 8 MG tablet Take 1 tablet (8 mg total) by mouth 2 (two) times daily as needed for refractory nausea / vomiting. Start on day 3 after carboplatin chemo. 30 tablet 1  . potassium chloride (KLOR-CON) 10 MEQ tablet Take 1 tablet (10 mEq total) by mouth as directed. Take 1 tablet daily as directed by MD. 30 tablet 0  . QUEtiapine (SEROQUEL XR) 300 MG 24 hr tablet Take 300 mg by mouth at bedtime.    . simvastatin (ZOCOR) 20 MG tablet Take 20 mg by mouth daily.    Marland Kitchen topiramate (TOPAMAX) 100 MG tablet Take 50-100 mg by mouth See admin instructions. Take  100 mg in the morning and 50 mg at night    . vitamin B-12 (CYANOCOBALAMIN) 1000 MCG tablet Take 1,000 mcg by mouth daily.     No current facility-administered medications for this visit.    Review of Systems  Constitutional: Positive for malaise/fatigue. Negative for chills, diaphoresis, fever and weight loss (up 3 lbs).       Feels tired and weak. Does not feels 100% back to normal. Not active.  HENT: Negative for congestion, ear discharge, ear pain, hearing loss, nosebleeds, sinus pain, sore throat and tinnitus.  Nail discoloration. Nail separation.  Eyes: Negative.  Negative for blurred vision, double vision and photophobia.       Visual changes (distance) is back to normal.   Respiratory: Positive for shortness of breath (on exertion). Negative for cough, hemoptysis and sputum production.        Sleeps with head propped up.  Cardiovascular: Negative.  Negative for chest pain, palpitations, orthopnea and leg swelling.  Gastrointestinal: Positive for nausea (on and off). Negative for abdominal pain, blood in stool (hemorrhoids), constipation, diarrhea, heartburn (reflux; on antacids; better), melena and vomiting.       Struggles with fluids. Drinking 3 cups of fluid per day. Eating better. Early satiety. Frequent bowel movements with hard stool.   Genitourinary: Negative for dysuria, frequency, hematuria and urgency.       Premenopausal.   Musculoskeletal: Positive for joint pain (left shoulder). Negative for back pain, myalgias and neck pain.       LEFT shoulder s/p injection.   Skin: Negative for itching and rash.       RIGHT breast lump barely palpable.  Neurological: Positive for weakness. Negative for dizziness, tingling, sensory change and headaches.  Endo/Heme/Allergies: Bruises/bleeds easily (bruising only).       Type II diabetes.   Psychiatric/Behavioral: Negative for depression and memory loss. The patient is not nervous/anxious and does not have insomnia.   All other  systems reviewed and are negative.  Performance status (ECOG): 1  Vitals Blood pressure 90/65, pulse (!) 112, temperature (!) 96.3 F (35.7 C), temperature source Tympanic, weight 137 lb 14.4 oz (62.5 kg), SpO2 100 %.   Physical Exam  Constitutional: She is oriented to person, place, and time. She appears well-developed and well-nourished. No distress.  HENT:  Head: Normocephalic and atraumatic.  Mouth/Throat: Oropharynx is clear and moist. No oropharyngeal exudate.  Purple flower head wrap. Mask.  Eyes: Pupils are equal, round, and reactive to light. Conjunctivae and EOM are normal. No scleral icterus.  Brown eyes.  Cardiovascular: Normal rate, regular rhythm and normal heart sounds.  No murmur heard. Pulmonary/Chest: Effort normal and breath sounds normal. No respiratory distress. She has no wheezes. She has no rales. She exhibits no tenderness.  RIGHT breast lump indistinct.  Abdominal: Soft. Bowel sounds are normal. She exhibits no distension and no mass. There is no abdominal tenderness. There is no rebound and no guarding.  Musculoskeletal:        General: No tenderness or edema. Normal range of motion.     Cervical back: Normal range of motion and neck supple.  Lymphadenopathy:       Head (right side): No preauricular, no posterior auricular and no occipital adenopathy present.       Head (left side): No preauricular, no posterior auricular and no occipital adenopathy present.    She has no cervical adenopathy.    She has no axillary adenopathy.       Right: No inguinal and no supraclavicular adenopathy present.       Left: No inguinal and no supraclavicular adenopathy present.  Neurological: She is alert and oriented to person, place, and time.  Skin: Skin is warm and dry. She is not diaphoretic.  Psychiatric: She has a normal mood and affect. Her behavior is normal. Judgment and thought content normal.  Nursing note and vitals reviewed.   Infusion on 01/05/2020    Component Date Value Ref Range Status  . Magnesium 01/05/2020 2.2  1.7 - 2.4 mg/dL Final   Performed at [facility], [address] [place], [place] [address]  . Sodium 01/05/2020 139  135 - 145 mmol/L Final  . Potassium 01/05/2020 4.0  3.5 - 5.1 mmol/L Final  . Chloride 01/05/2020 105  98 - 111 mmol/L Final  . CO2 01/05/2020 24  22 - 32 mmol/L Final  . Glucose, Bld 01/05/2020 102* 70 - 99 mg/dL Final   Glucose reference range applies only to samples taken after fasting for at least 8 hours.  . BUN 01/05/2020 14  6 - 20 mg/dL Final  . Creatinine, Ser 01/05/2020 0.73  0.44 - 1.00 mg/dL Final  . Calcium 01/05/2020 9.8  8.9 - 10.3 mg/dL Final  . Total Protein 01/05/2020 7.5  6.5 - 8.1 g/dL Final  . Albumin 01/05/2020 4.4  3.5 - 5.0 g/dL Final  . AST 01/05/2020 17  15 - 41 U/L Final  . ALT 01/05/2020 18  0 - 44 U/L Final  . Alkaline Phosphatase 01/05/2020 75  38 - 126 U/L Final  . Total Bilirubin 01/05/2020 0.3  0.3 - 1.2 mg/dL Final  . GFR calc non Af Amer 01/05/2020 >60  >60 mL/min Final  . GFR calc Af Amer 01/05/2020 >60  >60 mL/min Final  . Anion gap 01/05/2020 10  5 - 15 Final   Performed at [facility] [place], [address] [place], [place] [address]  . WBC 01/05/2020 8.5  4.0 - 10.5 K/uL Final  . RBC 01/05/2020 3.60* 3.87 - 5.11 MIL/uL Final  . Hemoglobin 01/05/2020 11.5* 12.0 - 15.0 g/dL Final  . HCT 01/05/2020 34.9* 36.0 - 46.0 % Final  . MCV 01/05/2020 96.9  80.0 - 100.0 fL Final  . MCH 01/05/2020 31.9  26.0 - 34.0 pg Final  . MCHC 01/05/2020 33.0  30.0 - 36.0 g/dL Final  . RDW 01/05/2020 15.0  11.5 - 15.5 % Final  . Platelets 01/05/2020 489* 150 - 400 K/uL Final  . nRBC 01/05/2020 0.0  0.0 - 0.2 % Final  . Neutrophils Relative % 01/05/2020 61  % Final  . Neutro Abs 01/05/2020 5.2  1.7 - 7.7 K/uL Final  . Lymphocytes Relative 01/05/2020 26  % Final  . Lymphs Abs 01/05/2020 2.2  0.7 - 4.0 K/uL Final  . Monocytes Relative 01/05/2020 10  % Final  .  Monocytes Absolute 01/05/2020 0.9  0.1 - 1.0 K/uL Final  . Eosinophils Relative 01/05/2020 0  % Final  . Eosinophils Absolute 01/05/2020 0.0  0.0 - 0.5 K/uL Final  . Basophils Relative 01/05/2020 1  % Final  . Basophils Absolute 01/05/2020 0.0  0.0 - 0.1 K/uL Final  . Immature Granulocytes 01/05/2020 2  % Final  . Abs Immature Granulocytes 01/05/2020 0.14* 0.00 - 0.07 K/uL Final   Performed at [facility], [address], [place], [place] [address]  . Preg Test, Ur 01/05/2020 NEGATIVE  NEGATIVE Final   Performed at [facility], [address] [place], [place] [address]    Assessment:  Heather Bell is a 44 y.o. female with clinical T1cN1triple negativerightbreast cancers/p ultrasound guided core biopsy. Pathology revealeda grade IIIinvasive mammary carcinoma, no special type of the right breast. Specimen was 10 mm.Ductal carcinoma in situ(DCIS)was not identified.Right axillarylymph nodewas positive for metastatic carcinoma.Tumor was ER - (<1%), PR - (<1%), and Her2/neu -.  Bilateral mammogram and right unilateral ultrasoundon 07/10/2019 revealed a 1.7 x 1.6 x 1.6 cmmass at the7 o'clock region of the right breast 7 cm from the nipple. There was  an abnormal1.4 x 0.7 x 0.9 cmaxillary lymph node. Therewas a second3 mmaxillary lymph node with minimal focal cortical thickening.An ultrasound-guided core biopsies of the mass and the enlarged right axillary lymph node was recommended. BI-RADS category 5.  PET scanon 08/11/2019 showed a hypermetabolic mass in the inferior RIGHT breast consistentwith herbreast carcinoma. There was noevidence hypermetabolic nodal metastasis.There was no distant metastatic disease.There was nopulmonary metastasis or skeletal metastasis.  E[place]oon 12/24/2020revealedan ejection fraction of50 to 55%. The left ventricle demonstrated global hypokinesis.MUGAon 08/24/2019 revealed an EF of 60%.MUGAon  12/08/2019 revealed an EF of57.4%. She is followed by Dr End.  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  Shereceived 12 weeksof Taxol + carboplatin(08/25/2019 -11/24/2019).Carboplatin was held on week #7 and 8 secondary to marginal counts. She received Granix on 10/28/2019 and 11/20/2019.  She is s/p 1 cycle of AC (12/15/2019) with Fulphila (pegfilgrastim-jmdb) support.  She has required IVF support.  Breast masswas larger after initiation of treatment felt secondary to post biopsy hematoma. Right breast ultrasoundon 09/07/2019 revealed the mass in the 7 o'clock position contained cystic components and measured 3 cm (compared to 1.7 cm on 07/10/2019). The biopsied lymph node with 3 mm cortical thickening in the axilla was similar. Right breast ultrasoundon 10/09/2019 revealed interval decrease in size of RIGHT breast mass (1.4 x 1.7 x 1.4 cm). Mass was less well defined and associated with parenchymal edema.Right breast ultrasoundon 12/03/2019 revealed a 1.3 x 1.5 x 1.2 cm irregular hypoechoic mass right breast 7 o'clock position 7 cm from nipple.  She is premenopausal. She stopped taking birth control pills on 08/17/2019.  She has iron deficiency. Ferritin was 21 with an iron saturation of 7% and a TIBC 343 on 09/08/2019.She received Venoferweekly x 3 (09/18/2019 - 10/06/2019).  She has B12 deficiency. B12 was 240 on 01/19/2021and 680 on 11/24/2019.She is on oral B12.  Folate was 62.7 on 09/08/2019.  She has recurrent orthostatic hypotension. Fluid intake is marginal. AM cortisol was 5.3 (low) on 09/22/2019 suggestive of adrenal insufficiency.ACTH stim testwas negative.  Cortisol was 9.0 on 12/24/2019.   Symptomatically, she feels "ok".  She tires easily.  She denies any nausea, vomiting or diarrhea.  Exam is stable.  Plan: 1.    Labs today:CBC with  diff, CMP, Mg. 2.Clinical stageIIB triple negativeright breast cancer  Patient is undergoingneoadjuvant chemotherapy. She completed Taxol + carboplatin x 12 (last 12/15/2019). She is s/p 1 cycle of AC (12/15/2019) with Filphila suppot. Cycle #1 noted for general weakness and dehydration requiring IVF support.    Despite Fulphila, patient developed neutropenia.  Labs reviewed.  Begin cycle #2 AC.   Discuss cardiac issues with Dr End today- done.   Patient consents to treatment. RTC tomorrow for Fulphila. 3. Chemotherapy induced anemia Hematocrit 34.9, hemoglobin11.5, MCV96.9today. Ferritin was 89 on 11/24/2019. Continue to monitor. 4. B12 deficiency Patient on oral B12.  B12 was 240 on 01/19/2021and 680 on 11/24/2019. Folate was 62.7 on 09/08/2019. 5.Chronic dehydration Cortisol level was 9.0 (normal) on 12/24/2019.  IVF 1 liter today.   Patient to return on Tuesdays, Wednesdays, and Fridays for fluids for the next 3 weeks. 6.MUGA scan on 01/22/2020. 7.   RTC on 01/14/2020 for CBC with diff. 8.   RTC on 01/24/2020 for MD assessment, labs (CBC with diff, CMP, Mg), review of MUGA scan, and cycle #3 AC.  I discussed the assessment and treatment plan with the patient.  The patient was provided an opportunity  to ask questions and all were answered.  The patient agreed with the plan and demonstrated an understanding of the instructions.  The patient was advised to call back if the symptoms worsen or if the condition fails to improve as anticipated.   Lequita Asal, MD, PhD    01/05/2020, 10:00 AM  I, Selena Batten am acting as scribe for [employer]. Mike Gip, MD, PhD.  I,  C. Mike Gip, MD, have reviewed the above documentation for accuracy and completeness, and I agree with the above.

## 2020-01-05 ENCOUNTER — Encounter: Payer: Self-pay | Admitting: Hematology and Oncology

## 2020-01-05 ENCOUNTER — Inpatient Hospital Stay: Payer: BC Managed Care – PPO

## 2020-01-05 ENCOUNTER — Other Ambulatory Visit: Payer: Self-pay

## 2020-01-05 ENCOUNTER — Inpatient Hospital Stay (HOSPITAL_BASED_OUTPATIENT_CLINIC_OR_DEPARTMENT_OTHER): Payer: BC Managed Care – PPO | Admitting: Hematology and Oncology

## 2020-01-05 VITALS — BP 99/67 | HR 91 | Temp 96.3°F | Wt 137.9 lb

## 2020-01-05 DIAGNOSIS — E538 Deficiency of other specified B group vitamins: Secondary | ICD-10-CM

## 2020-01-05 DIAGNOSIS — C50511 Malignant neoplasm of lower-outer quadrant of right female breast: Secondary | ICD-10-CM

## 2020-01-05 DIAGNOSIS — C50919 Malignant neoplasm of unspecified site of unspecified female breast: Secondary | ICD-10-CM

## 2020-01-05 DIAGNOSIS — I429 Cardiomyopathy, unspecified: Secondary | ICD-10-CM

## 2020-01-05 DIAGNOSIS — E860 Dehydration: Secondary | ICD-10-CM | POA: Diagnosis not present

## 2020-01-05 DIAGNOSIS — Z5111 Encounter for antineoplastic chemotherapy: Secondary | ICD-10-CM

## 2020-01-05 DIAGNOSIS — D509 Iron deficiency anemia, unspecified: Secondary | ICD-10-CM

## 2020-01-05 DIAGNOSIS — Z171 Estrogen receptor negative status [ER-]: Secondary | ICD-10-CM

## 2020-01-05 LAB — COMPREHENSIVE METABOLIC PANEL
ALT: 18 U/L (ref 0–44)
AST: 17 U/L (ref 15–41)
Albumin: 4.4 g/dL (ref 3.5–5.0)
Alkaline Phosphatase: 75 U/L (ref 38–126)
Anion gap: 10 (ref 5–15)
BUN: 14 mg/dL (ref 6–20)
CO2: 24 mmol/L (ref 22–32)
Calcium: 9.8 mg/dL (ref 8.9–10.3)
Chloride: 105 mmol/L (ref 98–111)
Creatinine, Ser: 0.73 mg/dL (ref 0.44–1.00)
GFR calc Af Amer: >60 mL/min (ref >60)
GFR calc non Af Amer: >60 mL/min (ref >60)
Glucose, Bld: 102 mg/dL — ABNORMAL HIGH (ref 70–99)
Potassium: 4 mmol/L (ref 3.5–5.1)
Sodium: 139 mmol/L (ref 135–145)
Total Bilirubin: 0.3 mg/dL (ref 0.3–1.2)
Total Protein: 7.5 g/dL (ref 6.5–8.1)

## 2020-01-05 LAB — CBC WITH DIFFERENTIAL/PLATELET
Abs Immature Granulocytes: 0.14 10*3/uL — ABNORMAL HIGH (ref 0.00–0.07)
Basophils Absolute: 0 10*3/uL (ref 0.0–0.1)
Basophils Relative: 1 %
Eosinophils Absolute: 0 10*3/uL (ref 0.0–0.5)
Eosinophils Relative: 0 %
HCT: 34.9 % — ABNORMAL LOW (ref 36.0–46.0)
Hemoglobin: 11.5 g/dL — ABNORMAL LOW (ref 12.0–15.0)
Immature Granulocytes: 2 %
Lymphocytes Relative: 26 %
Lymphs Abs: 2.2 10*3/uL (ref 0.7–4.0)
MCH: 31.9 pg (ref 26.0–34.0)
MCHC: 33 g/dL (ref 30.0–36.0)
MCV: 96.9 fL (ref 80.0–100.0)
Monocytes Absolute: 0.9 10*3/uL (ref 0.1–1.0)
Monocytes Relative: 10 %
Neutro Abs: 5.2 10*3/uL (ref 1.7–7.7)
Neutrophils Relative %: 61 %
Platelets: 489 10*3/uL — ABNORMAL HIGH (ref 150–400)
RBC: 3.6 MIL/uL — ABNORMAL LOW (ref 3.87–5.11)
RDW: 15 % (ref 11.5–15.5)
WBC: 8.5 10*3/uL (ref 4.0–10.5)
nRBC: 0 % (ref 0.0–0.2)

## 2020-01-05 LAB — MAGNESIUM: Magnesium: 2.2 mg/dL (ref 1.7–2.4)

## 2020-01-05 LAB — PREGNANCY, URINE: Preg Test, Ur: NEGATIVE

## 2020-01-05 MED ORDER — SODIUM CHLORIDE 0.9 % IV SOLN
150.0000 mg | Freq: Once | INTRAVENOUS | Status: AC
  Administered 2020-01-05: 150 mg via INTRAVENOUS
  Filled 2020-01-05: qty 5

## 2020-01-05 MED ORDER — DOXORUBICIN HCL CHEMO IV INJECTION 2 MG/ML
60.0000 mg/m2 | Freq: Once | INTRAVENOUS | Status: AC
  Administered 2020-01-05: 100 mg via INTRAVENOUS
  Filled 2020-01-05: qty 50

## 2020-01-05 MED ORDER — HEPARIN SOD (PORK) LOCK FLUSH 100 UNIT/ML IV SOLN
500.0000 [IU] | Freq: Once | INTRAVENOUS | Status: AC | PRN
  Administered 2020-01-05: 500 [IU]
  Filled 2020-01-05: qty 5

## 2020-01-05 MED ORDER — SODIUM CHLORIDE 0.9 % IV SOLN
10.0000 mg | Freq: Once | INTRAVENOUS | Status: AC
  Administered 2020-01-05: 10 mg via INTRAVENOUS
  Filled 2020-01-05: qty 10

## 2020-01-05 MED ORDER — SODIUM CHLORIDE 0.9 % IV SOLN
Freq: Once | INTRAVENOUS | Status: AC
  Filled 2020-01-05: qty 250

## 2020-01-05 MED ORDER — SODIUM CHLORIDE 0.9 % IV SOLN
600.0000 mg/m2 | Freq: Once | INTRAVENOUS | Status: AC
  Administered 2020-01-05: 1000 mg via INTRAVENOUS
  Filled 2020-01-05: qty 50

## 2020-01-05 MED ORDER — HEPARIN SOD (PORK) LOCK FLUSH 100 UNIT/ML IV SOLN
INTRAVENOUS | Status: AC
  Filled 2020-01-05: qty 5

## 2020-01-05 MED ORDER — PALONOSETRON HCL INJECTION 0.25 MG/5ML
0.2500 mg | Freq: Once | INTRAVENOUS | Status: AC
  Administered 2020-01-05: 0.25 mg via INTRAVENOUS
  Filled 2020-01-05: qty 5

## 2020-01-05 NOTE — Progress Notes (Signed)
Patient states that she feels weak and tired today. No other questions or concerns. Patients husband is out of town for work. She does not feel dizzy or lightheaded today. Sitting BP: 97/67 HR 106, standing BP: 90/65 [id number]

## 2020-01-06 ENCOUNTER — Inpatient Hospital Stay: Payer: BC Managed Care – PPO

## 2020-01-06 ENCOUNTER — Ambulatory Visit: Payer: BC Managed Care – PPO

## 2020-01-06 VITALS — BP 102/66 | HR 88 | Temp 99.1°F | Resp 18

## 2020-01-06 DIAGNOSIS — C50511 Malignant neoplasm of lower-outer quadrant of right female breast: Secondary | ICD-10-CM | POA: Diagnosis not present

## 2020-01-06 DIAGNOSIS — C50919 Malignant neoplasm of unspecified site of unspecified female breast: Secondary | ICD-10-CM

## 2020-01-06 DIAGNOSIS — E860 Dehydration: Secondary | ICD-10-CM

## 2020-01-06 MED ORDER — PEGFILGRASTIM-JMDB 6 MG/0.6ML ~~LOC~~ SOSY
6.0000 mg | PREFILLED_SYRINGE | Freq: Once | SUBCUTANEOUS | Status: AC
  Administered 2020-01-06: 6 mg via SUBCUTANEOUS

## 2020-01-06 MED ORDER — HEPARIN SOD (PORK) LOCK FLUSH 100 UNIT/ML IV SOLN
INTRAVENOUS | Status: AC
  Filled 2020-01-06: qty 5

## 2020-01-06 MED ORDER — HEPARIN SOD (PORK) LOCK FLUSH 100 UNIT/ML IV SOLN
500.0000 [IU] | Freq: Once | INTRAVENOUS | Status: AC
  Administered 2020-01-06: 500 [IU] via INTRAVENOUS
  Filled 2020-01-06: qty 5

## 2020-01-06 MED ORDER — SODIUM CHLORIDE 0.9 % IV SOLN
Freq: Once | INTRAVENOUS | Status: AC
  Filled 2020-01-06: qty 250

## 2020-01-06 MED ORDER — SODIUM CHLORIDE 0.9% FLUSH
10.0000 mL | INTRAVENOUS | Status: DC | PRN
  Administered 2020-01-06: 10 mL via INTRAVENOUS
  Filled 2020-01-06: qty 10

## 2020-01-08 ENCOUNTER — Other Ambulatory Visit: Payer: Self-pay

## 2020-01-08 ENCOUNTER — Inpatient Hospital Stay: Payer: BC Managed Care – PPO

## 2020-01-08 ENCOUNTER — Ambulatory Visit: Payer: BC Managed Care – PPO

## 2020-01-08 VITALS — BP 121/77 | HR 94 | Temp 99.0°F | Resp 18

## 2020-01-08 DIAGNOSIS — E860 Dehydration: Secondary | ICD-10-CM

## 2020-01-08 DIAGNOSIS — C50511 Malignant neoplasm of lower-outer quadrant of right female breast: Secondary | ICD-10-CM | POA: Diagnosis not present

## 2020-01-08 MED ORDER — HEPARIN SOD (PORK) LOCK FLUSH 100 UNIT/ML IV SOLN
500.0000 [IU] | Freq: Once | INTRAVENOUS | Status: AC
  Administered 2020-01-08: 500 [IU] via INTRAVENOUS
  Filled 2020-01-08: qty 5

## 2020-01-08 MED ORDER — SODIUM CHLORIDE 0.9 % IV SOLN
INTRAVENOUS | Status: DC
  Filled 2020-01-08 (×2): qty 250

## 2020-01-08 MED ORDER — SODIUM CHLORIDE 0.9% FLUSH
10.0000 mL | INTRAVENOUS | Status: AC | PRN
  Administered 2020-01-08: 10 mL via INTRAVENOUS
  Filled 2020-01-08: qty 10

## 2020-01-12 ENCOUNTER — Other Ambulatory Visit: Payer: Self-pay

## 2020-01-12 ENCOUNTER — Inpatient Hospital Stay: Payer: BC Managed Care – PPO

## 2020-01-12 DIAGNOSIS — C50511 Malignant neoplasm of lower-outer quadrant of right female breast: Secondary | ICD-10-CM | POA: Diagnosis not present

## 2020-01-12 DIAGNOSIS — D509 Iron deficiency anemia, unspecified: Secondary | ICD-10-CM

## 2020-01-12 MED ORDER — HEPARIN SOD (PORK) LOCK FLUSH 100 UNIT/ML IV SOLN
500.0000 [IU] | Freq: Once | INTRAVENOUS | Status: AC
  Administered 2020-01-12: 500 [IU] via INTRAVENOUS
  Filled 2020-01-12: qty 5

## 2020-01-12 MED ORDER — SODIUM CHLORIDE 0.9 % IV SOLN
Freq: Once | INTRAVENOUS | Status: AC
  Filled 2020-01-12: qty 250

## 2020-01-14 ENCOUNTER — Other Ambulatory Visit: Payer: Self-pay

## 2020-01-14 ENCOUNTER — Inpatient Hospital Stay: Payer: BC Managed Care – PPO

## 2020-01-14 ENCOUNTER — Other Ambulatory Visit: Payer: BC Managed Care – PPO

## 2020-01-14 VITALS — BP 107/72 | HR 96 | Temp 97.6°F | Resp 16

## 2020-01-14 DIAGNOSIS — C50511 Malignant neoplasm of lower-outer quadrant of right female breast: Secondary | ICD-10-CM

## 2020-01-14 DIAGNOSIS — D509 Iron deficiency anemia, unspecified: Secondary | ICD-10-CM

## 2020-01-14 LAB — CBC WITH DIFFERENTIAL/PLATELET
Abs Immature Granulocytes: 2.4 10*3/uL — ABNORMAL HIGH (ref 0.00–0.07)
Band Neutrophils: 22 %
Basophils Absolute: 0 10*3/uL (ref 0.0–0.1)
Basophils Relative: 0 %
Eosinophils Absolute: 0.3 10*3/uL (ref 0.0–0.5)
Eosinophils Relative: 2 %
HCT: 28.1 % — ABNORMAL LOW (ref 36.0–46.0)
Hemoglobin: 9.3 g/dL — ABNORMAL LOW (ref 12.0–15.0)
Lymphocytes Relative: 15 %
Lymphs Abs: 2 10*3/uL (ref 0.7–4.0)
MCH: 32.5 pg (ref 26.0–34.0)
MCHC: 33.1 g/dL (ref 30.0–36.0)
MCV: 98.3 fL (ref 80.0–100.0)
Metamyelocytes Relative: 8 %
Monocytes Absolute: 1.1 10*3/uL — ABNORMAL HIGH (ref 0.1–1.0)
Monocytes Relative: 8 %
Myelocytes: 8 %
Neutro Abs: 7.8 10*3/uL — ABNORMAL HIGH (ref 1.7–7.7)
Neutrophils Relative %: 35 %
Platelets: 196 10*3/uL (ref 150–400)
Promyelocytes Relative: 2 %
RBC: 2.86 MIL/uL — ABNORMAL LOW (ref 3.87–5.11)
RDW: 13.9 % (ref 11.5–15.5)
Smear Review: ADEQUATE
WBC: 13.6 10*3/uL — ABNORMAL HIGH (ref 4.0–10.5)
nRBC: 1.1 % — ABNORMAL HIGH (ref 0.0–0.2)

## 2020-01-14 MED ORDER — SODIUM CHLORIDE 0.9% FLUSH
10.0000 mL | Freq: Once | INTRAVENOUS | Status: AC
  Administered 2020-01-14: 10 mL via INTRAVENOUS
  Filled 2020-01-14: qty 10

## 2020-01-14 MED ORDER — HEPARIN SOD (PORK) LOCK FLUSH 100 UNIT/ML IV SOLN
500.0000 [IU] | Freq: Once | INTRAVENOUS | Status: AC
  Administered 2020-01-14: 500 [IU]
  Filled 2020-01-14: qty 5

## 2020-01-14 MED ORDER — SODIUM CHLORIDE 0.9 % IV SOLN
Freq: Once | INTRAVENOUS | Status: AC
  Filled 2020-01-14: qty 250

## 2020-01-15 ENCOUNTER — Inpatient Hospital Stay: Payer: BC Managed Care – PPO

## 2020-01-15 ENCOUNTER — Ambulatory Visit: Payer: BC Managed Care – PPO

## 2020-01-15 VITALS — BP 99/65 | HR 88 | Temp 99.0°F

## 2020-01-15 DIAGNOSIS — D509 Iron deficiency anemia, unspecified: Secondary | ICD-10-CM

## 2020-01-15 DIAGNOSIS — C50511 Malignant neoplasm of lower-outer quadrant of right female breast: Secondary | ICD-10-CM | POA: Diagnosis not present

## 2020-01-15 MED ORDER — HEPARIN SOD (PORK) LOCK FLUSH 100 UNIT/ML IV SOLN
INTRAVENOUS | Status: AC
  Filled 2020-01-15: qty 5

## 2020-01-15 MED ORDER — HEPARIN SOD (PORK) LOCK FLUSH 100 UNIT/ML IV SOLN
500.0000 [IU] | Freq: Once | INTRAVENOUS | Status: AC | PRN
  Administered 2020-01-15: 500 [IU]
  Filled 2020-01-15: qty 5

## 2020-01-15 MED ORDER — SODIUM CHLORIDE 0.9 % IV SOLN
Freq: Once | INTRAVENOUS | Status: AC
  Filled 2020-01-15: qty 250

## 2020-01-15 NOTE — Progress Notes (Signed)
1L NS ran over 1hour 01/14/20 per Pt and infusion encounter. Pt states she tolerated it well. Per. Dr. Grayland Ormond okay to run NS at 999 ml/hr.

## 2020-01-19 ENCOUNTER — Ambulatory Visit: Payer: BC Managed Care – PPO

## 2020-01-19 ENCOUNTER — Other Ambulatory Visit: Payer: Self-pay

## 2020-01-19 ENCOUNTER — Inpatient Hospital Stay: Payer: BC Managed Care – PPO | Attending: Hematology and Oncology

## 2020-01-19 DIAGNOSIS — E538 Deficiency of other specified B group vitamins: Secondary | ICD-10-CM | POA: Diagnosis not present

## 2020-01-19 DIAGNOSIS — C50511 Malignant neoplasm of lower-outer quadrant of right female breast: Secondary | ICD-10-CM | POA: Diagnosis present

## 2020-01-19 DIAGNOSIS — Z7689 Persons encountering health services in other specified circumstances: Secondary | ICD-10-CM | POA: Diagnosis not present

## 2020-01-19 DIAGNOSIS — K219 Gastro-esophageal reflux disease without esophagitis: Secondary | ICD-10-CM | POA: Diagnosis not present

## 2020-01-19 DIAGNOSIS — R5383 Other fatigue: Secondary | ICD-10-CM | POA: Diagnosis not present

## 2020-01-19 DIAGNOSIS — Z7952 Long term (current) use of systemic steroids: Secondary | ICD-10-CM | POA: Insufficient documentation

## 2020-01-19 DIAGNOSIS — Z5111 Encounter for antineoplastic chemotherapy: Secondary | ICD-10-CM | POA: Diagnosis not present

## 2020-01-19 DIAGNOSIS — F319 Bipolar disorder, unspecified: Secondary | ICD-10-CM | POA: Diagnosis not present

## 2020-01-19 DIAGNOSIS — E119 Type 2 diabetes mellitus without complications: Secondary | ICD-10-CM | POA: Insufficient documentation

## 2020-01-19 DIAGNOSIS — E785 Hyperlipidemia, unspecified: Secondary | ICD-10-CM | POA: Diagnosis not present

## 2020-01-19 DIAGNOSIS — Z7984 Long term (current) use of oral hypoglycemic drugs: Secondary | ICD-10-CM | POA: Diagnosis not present

## 2020-01-19 DIAGNOSIS — Z3202 Encounter for pregnancy test, result negative: Secondary | ICD-10-CM | POA: Insufficient documentation

## 2020-01-19 DIAGNOSIS — Z79899 Other long term (current) drug therapy: Secondary | ICD-10-CM | POA: Diagnosis not present

## 2020-01-19 DIAGNOSIS — R5381 Other malaise: Secondary | ICD-10-CM | POA: Diagnosis not present

## 2020-01-19 DIAGNOSIS — D509 Iron deficiency anemia, unspecified: Secondary | ICD-10-CM

## 2020-01-19 DIAGNOSIS — Z801 Family history of malignant neoplasm of trachea, bronchus and lung: Secondary | ICD-10-CM | POA: Insufficient documentation

## 2020-01-19 MED ORDER — HEPARIN SOD (PORK) LOCK FLUSH 100 UNIT/ML IV SOLN
500.0000 [IU] | Freq: Once | INTRAVENOUS | Status: AC
  Administered 2020-01-19: 500 [IU] via INTRAVENOUS
  Filled 2020-01-19: qty 5

## 2020-01-19 MED ORDER — SODIUM CHLORIDE 0.9 % IV SOLN
Freq: Once | INTRAVENOUS | Status: AC
  Filled 2020-01-19: qty 250

## 2020-01-20 ENCOUNTER — Inpatient Hospital Stay: Payer: BC Managed Care – PPO

## 2020-01-20 VITALS — BP 102/65 | HR 102 | Temp 99.8°F

## 2020-01-20 DIAGNOSIS — C50511 Malignant neoplasm of lower-outer quadrant of right female breast: Secondary | ICD-10-CM | POA: Diagnosis not present

## 2020-01-20 DIAGNOSIS — D509 Iron deficiency anemia, unspecified: Secondary | ICD-10-CM

## 2020-01-20 MED ORDER — HEPARIN SOD (PORK) LOCK FLUSH 100 UNIT/ML IV SOLN
500.0000 [IU] | Freq: Once | INTRAVENOUS | Status: AC | PRN
  Administered 2020-01-20: 500 [IU]
  Filled 2020-01-20: qty 5

## 2020-01-20 MED ORDER — HEPARIN SOD (PORK) LOCK FLUSH 100 UNIT/ML IV SOLN
INTRAVENOUS | Status: AC
  Filled 2020-01-20: qty 5

## 2020-01-20 MED ORDER — SODIUM CHLORIDE 0.9% FLUSH
10.0000 mL | Freq: Once | INTRAVENOUS | Status: AC | PRN
  Administered 2020-01-20: 10 mL
  Filled 2020-01-20: qty 10

## 2020-01-20 MED ORDER — SODIUM CHLORIDE 0.9 % IV SOLN
Freq: Once | INTRAVENOUS | Status: AC
  Filled 2020-01-20: qty 250

## 2020-01-22 ENCOUNTER — Ambulatory Visit
Admission: RE | Admit: 2020-01-22 | Discharge: 2020-01-22 | Disposition: A | Discharge status: Home / Self Care | Payer: BC Managed Care – PPO | Source: Ambulatory Visit | Attending: Hematology and Oncology | Admitting: Hematology and Oncology

## 2020-01-22 ENCOUNTER — Inpatient Hospital Stay: Payer: BC Managed Care – PPO

## 2020-01-22 ENCOUNTER — Other Ambulatory Visit: Payer: Self-pay

## 2020-01-22 VITALS — BP 97/63 | HR 67 | Temp 98.3°F | Resp 18

## 2020-01-22 DIAGNOSIS — C50511 Malignant neoplasm of lower-outer quadrant of right female breast: Secondary | ICD-10-CM | POA: Diagnosis not present

## 2020-01-22 DIAGNOSIS — I429 Cardiomyopathy, unspecified: Secondary | ICD-10-CM | POA: Diagnosis not present

## 2020-01-22 DIAGNOSIS — D509 Iron deficiency anemia, unspecified: Secondary | ICD-10-CM

## 2020-01-22 MED ORDER — TECHNETIUM TC 99M-LABELED RED BLOOD CELLS IV KIT
25.0000 | PACK | Freq: Once | INTRAVENOUS | Status: AC | PRN
  Administered 2020-01-22: 22.17 via INTRAVENOUS

## 2020-01-22 MED ORDER — SODIUM CHLORIDE 0.9% FLUSH
10.0000 mL | Freq: Once | INTRAVENOUS | Status: AC | PRN
  Administered 2020-01-22: 10 mL
  Filled 2020-01-22: qty 10

## 2020-01-22 MED ORDER — SODIUM CHLORIDE 0.9 % IV SOLN
Freq: Once | INTRAVENOUS | Status: AC
  Filled 2020-01-22: qty 250

## 2020-01-22 MED ORDER — HEPARIN SOD (PORK) LOCK FLUSH 100 UNIT/ML IV SOLN
500.0000 [IU] | Freq: Once | INTRAVENOUS | Status: AC | PRN
  Administered 2020-01-22: 500 [IU]
  Filled 2020-01-22: qty 5

## 2020-01-22 MED ORDER — HEPARIN SOD (PORK) LOCK FLUSH 100 UNIT/ML IV SOLN
INTRAVENOUS | Status: AC
  Filled 2020-01-22: qty 5

## 2020-01-22 NOTE — Progress Notes (Signed)
Pt scheduled to receive 1L of IVF over 2 hrsr. Pt requested to receive IVF over 1 hr. Dr Mike Gip stated for pt to just receive 1/2 L over 1 hr. Pt agreeable.

## 2020-01-25 ENCOUNTER — Other Ambulatory Visit: Payer: BC Managed Care – PPO

## 2020-01-25 ENCOUNTER — Ambulatory Visit: Payer: BC Managed Care – PPO | Admitting: Hematology and Oncology

## 2020-01-25 ENCOUNTER — Encounter: Payer: Self-pay | Admitting: Hematology and Oncology

## 2020-01-25 ENCOUNTER — Ambulatory Visit: Payer: BC Managed Care – PPO

## 2020-01-25 NOTE — Progress Notes (Signed)
Patient was called for pre assessment. She denied any pain or concerns. She only reports that she feels a little achy and sore in muscles. She would also like to discuss new prescription for potassium pills.

## 2020-01-25 NOTE — Progress Notes (Signed)
[facility]  [address] [place], [place] [address] Phone: [phone]  Fax: [phone]   Clinic Day:  01/26/2020  Referring physician: [facility], [place]  Chief Complaint: Heather Bell is a 44 y.o. female with clinical stage IIBtriple negativerightbreast cancer who is seen for assessment prior to cycle #3 AC.   HPI: The patient was last seen in the medical oncology clinic on 01/05/2020. At that time, she felt "ok".  She got tired easily.  She denied any nausea, vomiting or diarrhea.  Exam was stable. Hematocrit was 34.9, hemoglobin 11.5, MCV 96.9, platelets 489,000, WBC 8,500, ANC 5,200.  She received cycle #2 AC with Fulphila support.  She received IVF.  She has received IVF 3 times a week (last 01/22/2020).  She typically receives 1 liter NS.  On 01/22/2020 she received 500 cc per her request.   MUGA on 01/22/2020 revealed an EF of 56.5%.  During the interim, she has been ok. She states that cycle #2 was better than #1. She didn't feel as dizzy. She really thinks that the extra fluids have helped her the most. She has had less nausea overall. She is looking forward towards the end of chemotherapy, but she is nervous about having surgery. She is having some mild muscle pain from helping her mom pack and move some boxes.    Past Medical History:  Diagnosis Date  . Allergic rhinitis   . Anxiety   . Bipolar disorder ([place])    depression  . Cancer [facility])    breast right  . Depression   . Diabetes ([place])   . GERD (gastroesophageal reflux disease)   . Hyperlipidemia     Past Surgical History:  Procedure Laterality Date  . BREAST BIOPSY Right 07/22/2019   mass bx, path pending, heart marker  . BREAST BIOPSY Right 07/22/2019   LN bx, path pending,  butterfly hydromarker  . IMAGE GUIDED SINUS SURGERY    . KNEE SURGERY    . NASAL SEPTUM SURGERY    . PORTACATH PLACEMENT N/A 08/07/2019   Procedure: INSERTION PORT-A-CATH;  Surgeon:  Herbert Pun, MD;  Location: [facility] ORS;  Service: General;  Laterality: N/A;  . TONSILLECTOMY      Family History  Problem Relation Age of Onset  . Lung cancer Father   . Heart attack Father   . Lung cancer Paternal Uncle   . Heart disease Paternal Uncle   . Lung cancer Paternal Aunt     Social History:  reports that she has never smoked. She has never used smokeless tobacco. She reports that she does not drink alcohol or use drugs. Deniesany exposure to radiation or toxins.She has 2 children (57 year old daughter and 8 year old son).She use to work inthefast foodindustry. She is currently not working. Her husbandisMichael.Michaelused totravel a lot for his job.Her husband found a new job closer to home in [place], [place]. The patient is alone today.   Allergies:  Allergies  Allergen Reactions  . Compazine [Prochlorperazine Edisylate] Other (See Comments)    Acute dystonic reaction  . Morphine And Related Shortness Of Breath  . Doxycycline Other (See Comments)    "made all my symptoms worse"   . Fluticasone Other (See Comments)    Doesn't work  . Levofloxacin Other (See Comments)    "made all my symptoms worse"     Current Medications: Current Outpatient Medications  Medication Sig Dispense Refill  . amphetamine-dextroamphetamine (ADDERALL XR) 25 MG 24 hr capsule  Take 25 mg by mouth daily.    . Calcium Carbonate-Vitamin D (CALCIUM 500 + D) 500-125 MG-UNIT TABS Take 1 tablet by mouth daily.    . cetirizine (ZYRTEC) 10 MG tablet Take 10 mg by mouth daily.     Marland Kitchen dexamethasone (DECADRON) 4 MG tablet Take 2 tablets by mouth once a day on the day after chemotherapy then 2 tablets two times a day for 2 days.  Take with food. 20 tablet 1  . doxepin (SINEQUAN) 25 MG capsule Take 25 mg by mouth at bedtime.     Marland Kitchen ibuprofen (ADVIL) 200 MG tablet Take 400-800 mg by mouth every 6 (six) hours as needed for headache or moderate pain.    Marland Kitchen lamoTRIgine (LAMICTAL) 200 MG tablet  Take 200 mg by mouth at bedtime.     . lidocaine-prilocaine (EMLA) cream Apply topically over port-a-cath 30 minutes to 1 hour prior to treatment. 30 g 2  . loratadine (CLARITIN) 10 MG tablet Take 10 mg by mouth daily.    Marland Kitchen lurasidone (LATUDA) 40 MG TABS tablet Take 40 mg by mouth at bedtime. Take with 60 mg to equal 100 mg at night    . Lurasidone HCl 60 MG TABS Take 60 mg by mouth at bedtime. Take with 40 mg to equal 100 mg at night    . metFORMIN (GLUCOPHAGE-XR) 500 MG 24 hr tablet Take 1,000 mg by mouth 2 (two) times daily.    . mometasone (NASONEX) 50 MCG/ACT nasal spray Place 2 sprays into the nose daily as needed (allergies).     . Multiple Vitamin (MULTIVITAMIN WITH MINERALS) TABS tablet Take 1 tablet by mouth daily.    Marland Kitchen omeprazole (PRILOSEC) 20 MG capsule Take 20 mg by mouth daily.     . ondansetron (ZOFRAN) 8 MG tablet Take 1 tablet (8 mg total) by mouth 2 (two) times daily as needed for refractory nausea / vomiting. Start on day 3 after carboplatin chemo. 30 tablet 1  . potassium chloride (KLOR-CON) 10 MEQ tablet Take 1 tablet (10 mEq total) by mouth as directed. Take 1 tablet daily as directed by MD. 30 tablet 0  . QUEtiapine (SEROQUEL XR) 300 MG 24 hr tablet Take 300 mg by mouth at bedtime.    . simvastatin (ZOCOR) 20 MG tablet Take 20 mg by mouth daily.    Marland Kitchen topiramate (TOPAMAX) 100 MG tablet Take 50-100 mg by mouth See admin instructions. Take 100 mg in the morning and 50 mg at night    . vitamin B-12 (CYANOCOBALAMIN) 1000 MCG tablet Take 1,000 mcg by mouth daily.     No current facility-administered medications for this visit.   Facility-Administered Medications Ordered in Other Visits  Medication Dose Route Frequency Provider Last Rate Last Admin  . 0.9 %  sodium chloride infusion   Intravenous Continuous Lequita Asal, MD   Stopped at 01/08/20 1516  . sodium chloride flush (NS) 0.9 % injection 10 mL  10 mL Intravenous PRN Lequita Asal, MD   10 mL at 01/08/20 1314      Review of Systems  Constitutional: Positive for malaise/fatigue. Negative for chills, diaphoresis, fever and weight loss (up 3 lbs).       Feels "ok".  HENT: Negative for congestion, ear discharge, ear pain, hearing loss, nosebleeds, sinus pain, sore throat and tinnitus.        Nail discoloration. Nail separation.  Eyes: Negative.  Negative for blurred vision, double vision and photophobia.  Visual changes (distance) is back to normal.   Respiratory: Positive for shortness of breath (on exertion). Negative for cough, hemoptysis and sputum production.   Cardiovascular: Negative.  Negative for chest pain, palpitations, orthopnea and leg swelling.  Gastrointestinal: Positive for nausea (on and off, vastly improved). Negative for abdominal pain, blood in stool (hemorrhoids), constipation, diarrhea, heartburn (reflux; on antacids; better), melena and vomiting.       Struggles with fluids. Drinking 3 cups of fluid per day. Eating better. Early satiety. Frequent bowel movements with hard stool.   Genitourinary: Negative for dysuria, frequency, hematuria and urgency.       Premenopausal.   Musculoskeletal: Positive for joint pain (left shoulder) and myalgias (legs and arms after recent activity). Negative for back pain and neck pain.       LEFT shoulder s/p injection.   Skin: Negative for itching and rash.       RIGHT breast lump barely palpable.  Neurological: Positive for weakness. Negative for dizziness, tingling and sensory change.  Endo/Heme/Allergies: Bruises/bleeds easily (bruising only).       Type II diabetes.   Psychiatric/Behavioral: Negative for depression and memory loss. The patient is not nervous/anxious and does not have insomnia.   All other systems reviewed and are negative.  Performance status (ECOG): 1 - Symptomatic but completely ambulatory   Vitals Blood pressure 92/61, pulse 99, temperature (!) 96.5 F (35.8 C), temperature source Tympanic, resp. rate 16, weight  140 lb 5.2 oz (63.6 kg), SpO2 100 %.   Physical Exam Vitals and nursing note reviewed.  Constitutional:      General: She is not in acute distress.    Appearance: She is well-developed and well-nourished. She is not diaphoretic.  HENT:     Head: Normocephalic and atraumatic.     Mouth/Throat:     Mouth: Oropharynx is clear and moist.     Pharynx: No oropharyngeal exudate.      Comments: Blue scarf. Mask. Eyes:     General: No scleral icterus.    Extraocular Movements: EOM normal.     Conjunctiva/sclera: Conjunctivae normal.     Pupils: Pupils are equal, round, and reactive to light.     Comments: Brown eyes.  Cardiovascular:     Rate and Rhythm: Normal rate and regular rhythm.     Heart sounds: Normal heart sounds. No murmur heard.   Pulmonary:     Effort: Pulmonary effort is normal. No respiratory distress.     Breath sounds: Normal breath sounds. No wheezing or rales.     Comments: RIGHT breast lump indistinct. Chest:     Chest wall: No tenderness.  Abdominal:     General: Bowel sounds are normal. There is no distension.     Palpations: Abdomen is soft. There is no mass.     Tenderness: There is no abdominal tenderness. There is no guarding or rebound.  Musculoskeletal:        General: No tenderness or edema. Normal range of motion.     Cervical back: Normal range of motion and neck supple.  Lymphadenopathy:     Head:     Right side of head: No preauricular, posterior auricular or occipital adenopathy.     Left side of head: No preauricular, posterior auricular or occipital adenopathy.     Cervical: No cervical adenopathy.     Upper Body:  No axillary adenopathy present.    Right upper body: No supraclavicular adenopathy.     Left upper body: No supraclavicular adenopathy.  Lower Body: No right inguinal adenopathy. No left inguinal adenopathy.  Skin:    General: Skin is warm and dry.     Findings: No erythema.  Neurological:     Mental Status: She is alert and  oriented to person, place, and time.  Psychiatric:        Mood and Affect: Mood and affect normal.        Behavior: Behavior normal.        Thought Content: Thought content normal.        Judgment: Judgment normal.    Infusion on 01/26/2020  Component Date Value Ref Range Status  . Magnesium 01/26/2020 1.9  1.7 - 2.4 mg/dL Final   Performed at [facility], [address], [place], [place] [address]  . Sodium 01/26/2020 140  135 - 145 mmol/L Final  . Potassium 01/26/2020 4.1  3.5 - 5.1 mmol/L Final  . Chloride 01/26/2020 107  98 - 111 mmol/L Final  . CO2 01/26/2020 24  22 - 32 mmol/L Final  . Glucose, Bld 01/26/2020 102* 70 - 99 mg/dL Final   Glucose reference range applies only to samples taken after fasting for at least 8 hours.  . BUN 01/26/2020 18  6 - 20 mg/dL Final  . Creatinine, Ser 01/26/2020 0.84  0.44 - 1.00 mg/dL Final  . Calcium 01/26/2020 9.2  8.9 - 10.3 mg/dL Final  . Total Protein 01/26/2020 6.9  6.5 - 8.1 g/dL Final  . Albumin 01/26/2020 3.8  3.5 - 5.0 g/dL Final  . AST 01/26/2020 15  15 - 41 U/L Final  . ALT 01/26/2020 17  0 - 44 U/L Final  . Alkaline Phosphatase 01/26/2020 73  38 - 126 U/L Final  . Total Bilirubin 01/26/2020 0.3  0.3 - 1.2 mg/dL Final  . GFR calc non Af Amer 01/26/2020 >60  >60 mL/min Final  . GFR calc Af Amer 01/26/2020 >60  >60 mL/min Final  . Anion gap 01/26/2020 9  5 - 15 Final   Performed at [facility], [address], [place], [place] [address]  . WBC 01/26/2020 8.6  4.0 - 10.5 K/uL Final  . RBC 01/26/2020 3.15* 3.87 - 5.11 MIL/uL Final  . Hemoglobin 01/26/2020 10.2* 12.0 - 15.0 g/dL Final  . HCT 01/26/2020 31.0* 36.0 - 46.0 % Final  . MCV 01/26/2020 98.4  80.0 - 100.0 fL Final  . MCH 01/26/2020 32.4  26.0 - 34.0 pg Final  . MCHC 01/26/2020 32.9  30.0 - 36.0 g/dL Final  . RDW 01/26/2020 14.1  11.5 - 15.5 % Final  . Platelets 01/26/2020 350  150 - 400 K/uL Final  . nRBC 01/26/2020 0.0  0.0 - 0.2 % Final  .  Neutrophils Relative % 01/26/2020 74  % Final  . Neutro Abs 01/26/2020 6.4  1.7 - 7.7 K/uL Final  . Lymphocytes Relative 01/26/2020 14  % Final  . Lymphs Abs 01/26/2020 1.2  0.7 - 4.0 K/uL Final  . Monocytes Relative 01/26/2020 10  % Final  . Monocytes Absolute 01/26/2020 0.9  0.1 - 1.0 K/uL Final  . Eosinophils Relative 01/26/2020 1  % Final  . Eosinophils Absolute 01/26/2020 0.1  0.0 - 0.5 K/uL Final  . Basophils Relative 01/26/2020 0  % Final  . Basophils Absolute 01/26/2020 0.0  0.0 - 0.1 K/uL Final  . Immature Granulocytes 01/26/2020 1  % Final  . Abs Immature Granulocytes 01/26/2020 0.08* 0.00 - 0.07 K/uL Final   Performed at [facility], [address] [place], [place] [address]    Assessment:  Heather Bell is a 43 y.o. female with clinical T1cN1triple negativerightbreast cancers/p ultrasound guided core biopsy. Pathology revealeda grade IIIinvasive mammary carcinoma, no special type of the right breast. Specimen was 10 mm.Ductal carcinoma in situ(DCIS)was not identified.Right axillarylymph nodewas positive for metastatic carcinoma.Tumor was ER - (<1%), PR - (<1%), and Her2/neu -.  Bilateral mammogram and right unilateral ultrasoundon 07/10/2019 revealed a 1.7 x 1.6 x 1.6 cmmass at the7 o'clock region of the right breast 7 cm from the nipple. There was an abnormal1.4 x 0.7 x 0.9 cmaxillary lymph node. Therewas a second3 mmaxillary lymph node with minimal focal cortical thickening.An ultrasound-guided core biopsies of the mass and the enlarged right axillary lymph node was recommended. BI-RADS category 5.  PET scanon 08/11/2019 showed a hypermetabolic mass in the inferior RIGHT breast consistentwith herbreast carcinoma. There was noevidence hypermetabolic nodal metastasis.There was no distant metastatic disease.There was nopulmonary metastasis or skeletal metastasis.  Echoon 12/24/2020revealedan ejection fraction of50 to 55%.  The left ventricle demonstrated global hypokinesis.MUGAon 08/24/2019 revealed an EF of 60%.MUGAon 12/08/2019 revealed an EF of57.4%.  MUGA on 01/22/2020 revealed an EF of 56.5%.  She is followed by Dr End.  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  Shereceived 12 weeksof Taxol + carboplatin(08/25/2019 -11/24/2019).Carboplatin was held on week #7 and 8 secondary to marginal counts. She received Granix on 10/28/2019 and 11/20/2019.  She is s/p 2 cycles of AC (12/15/2019 - 01/05/2020) with Fulphila ([id number]) support.  She has required IVF support.  Breast masswas larger after initiation of treatment felt secondary to post biopsy hematoma. Right breast ultrasoundon 09/07/2019 revealed the mass in the 7 o'clock position contained cystic components and measured 3 cm (compared to 1.7 cm on 07/10/2019). The biopsied lymph node with 3 mm cortical thickening in the axilla was similar. Right breast ultrasoundon 10/09/2019 revealed interval decrease in size of RIGHT breast mass (1.4 x 1.7 x 1.4 cm). Mass was less well defined and associated with parenchymal edema.Right breast ultrasoundon 12/03/2019 revealed a 1.3 x 1.5 x 1.2 cm irregular hypoechoic mass right breast 7 o'clock position 7 cm from nipple.  She is premenopausal. She stopped taking birth control pills on 08/17/2019.  She has iron deficiency. Ferritin was 21 with an iron saturation of 7% and a TIBC 343 on 09/08/2019.She received Venoferweekly x 3 (09/18/2019 - 10/06/2019).  She has B12 deficiency. B12 was 240 on 01/19/2021and 680 on 11/24/2019.She is on oral B12.  Folate was 62.7 on 09/08/2019.  She has recurrent orthostatic hypotension. Fluid intake is marginal. AM cortisol was 5.3 (low) on 09/22/2019 suggestive of adrenal insufficiency.ACTH stim testwas negative.  Cortisol was 9.0 on  12/24/2019.   Symptomatically, she feels "ok".  She feels comfortable proceeding with her next cycle of chemotherapy.  Exam is stable  Plan: 1.    Labs today: CBC with diff, CMP, Mg. 2.   Urine pregnancy test today. 3.Clinical stageIIB triple negativeright breast cancer  Patient is undergoingneoadjuvant chemotherapy. She completed Taxol + carboplatin x 12 (last 12/15/2019). She is s/p 2 cycles of AC (last 01/05/2020) with Filphila suppot.  Labs reviewed.  Begin cycle #3 AC.Marland Kitchen RTC tomorrow for Fulphila. 4. Chemotherapy induced anemia Hematocrit 31.0, hemoglobin10.2, MCV98.4today. Ferritin was 89 on 11/24/2019. Continue to monitor. 5. B12 deficiency Patient on oral B12.  B12 was 240 on 01/19/2021and 680 on 11/24/2019. Folate was 62.7 on 09/08/2019.  6.Chronic dehydration Cortisol level was 9.0 (normal) on 12/24/2019.  Patient request ongoing fluids during treatment. 7.IVF today.  8.   Cycle #3 AC today. 9.   RTC tomorrow for Fulphila. 10.   RTC on Tuesdays, Wednesdays, and Fridays for fluids x 3 weeks. 11.   RTC in 3 weeks for MD assessment, labs (CBC with diff, CMP, Mg), and cycle #4 AC.  I discussed the assessment and treatment plan with the patient.  The patient was provided an opportunity to ask questions and all were answered.  The patient agreed with the plan and demonstrated an understanding of the instructions.  The patient was advised to call back if the symptoms worsen or if the condition fails to improve as anticipated.   Lequita Asal, MD, PhD    01/26/2020, 10:07 AM  I, Jacqualyn Posey, am acting as a Education administrator for [employer]. Mike Gip, MD.   I, Zakkery Dorian C. Mike Gip, MD, have reviewed the above documentation for accuracy and completeness, and I agree with the above.

## 2020-01-26 ENCOUNTER — Inpatient Hospital Stay (HOSPITAL_BASED_OUTPATIENT_CLINIC_OR_DEPARTMENT_OTHER): Payer: BC Managed Care – PPO | Admitting: Hematology and Oncology

## 2020-01-26 ENCOUNTER — Inpatient Hospital Stay: Payer: BC Managed Care – PPO

## 2020-01-26 ENCOUNTER — Encounter: Payer: Self-pay | Admitting: Hematology and Oncology

## 2020-01-26 ENCOUNTER — Other Ambulatory Visit: Payer: Self-pay

## 2020-01-26 ENCOUNTER — Other Ambulatory Visit: Payer: Self-pay | Admitting: Hematology and Oncology

## 2020-01-26 VITALS — BP 92/61 | HR 99 | Temp 96.5°F | Resp 16 | Wt 140.3 lb

## 2020-01-26 DIAGNOSIS — Z171 Estrogen receptor negative status [ER-]: Secondary | ICD-10-CM

## 2020-01-26 DIAGNOSIS — E538 Deficiency of other specified B group vitamins: Secondary | ICD-10-CM | POA: Diagnosis not present

## 2020-01-26 DIAGNOSIS — C50919 Malignant neoplasm of unspecified site of unspecified female breast: Secondary | ICD-10-CM

## 2020-01-26 DIAGNOSIS — D649 Anemia, unspecified: Secondary | ICD-10-CM | POA: Diagnosis not present

## 2020-01-26 DIAGNOSIS — D509 Iron deficiency anemia, unspecified: Secondary | ICD-10-CM

## 2020-01-26 DIAGNOSIS — C50511 Malignant neoplasm of lower-outer quadrant of right female breast: Secondary | ICD-10-CM | POA: Diagnosis not present

## 2020-01-26 DIAGNOSIS — E860 Dehydration: Secondary | ICD-10-CM

## 2020-01-26 DIAGNOSIS — Z5111 Encounter for antineoplastic chemotherapy: Secondary | ICD-10-CM

## 2020-01-26 LAB — COMPREHENSIVE METABOLIC PANEL
ALT: 17 U/L (ref 0–44)
AST: 15 U/L (ref 15–41)
Albumin: 3.8 g/dL (ref 3.5–5.0)
Alkaline Phosphatase: 73 U/L (ref 38–126)
Anion gap: 9 (ref 5–15)
BUN: 18 mg/dL (ref 6–20)
CO2: 24 mmol/L (ref 22–32)
Calcium: 9.2 mg/dL (ref 8.9–10.3)
Chloride: 107 mmol/L (ref 98–111)
Creatinine, Ser: 0.84 mg/dL (ref 0.44–1.00)
GFR calc Af Amer: >60 mL/min (ref >60)
GFR calc non Af Amer: >60 mL/min (ref >60)
Glucose, Bld: 102 mg/dL — ABNORMAL HIGH (ref 70–99)
Potassium: 4.1 mmol/L (ref 3.5–5.1)
Sodium: 140 mmol/L (ref 135–145)
Total Bilirubin: 0.3 mg/dL (ref 0.3–1.2)
Total Protein: 6.9 g/dL (ref 6.5–8.1)

## 2020-01-26 LAB — CBC WITH DIFFERENTIAL/PLATELET
Abs Immature Granulocytes: 0.08 10*3/uL — ABNORMAL HIGH (ref 0.00–0.07)
Basophils Absolute: 0 10*3/uL (ref 0.0–0.1)
Basophils Relative: 0 %
Eosinophils Absolute: 0.1 10*3/uL (ref 0.0–0.5)
Eosinophils Relative: 1 %
HCT: 31 % — ABNORMAL LOW (ref 36.0–46.0)
Hemoglobin: 10.2 g/dL — ABNORMAL LOW (ref 12.0–15.0)
Immature Granulocytes: 1 %
Lymphocytes Relative: 14 %
Lymphs Abs: 1.2 10*3/uL (ref 0.7–4.0)
MCH: 32.4 pg (ref 26.0–34.0)
MCHC: 32.9 g/dL (ref 30.0–36.0)
MCV: 98.4 fL (ref 80.0–100.0)
Monocytes Absolute: 0.9 10*3/uL (ref 0.1–1.0)
Monocytes Relative: 10 %
Neutro Abs: 6.4 10*3/uL (ref 1.7–7.7)
Neutrophils Relative %: 74 %
Platelets: 350 10*3/uL (ref 150–400)
RBC: 3.15 MIL/uL — ABNORMAL LOW (ref 3.87–5.11)
RDW: 14.1 % (ref 11.5–15.5)
WBC: 8.6 10*3/uL (ref 4.0–10.5)
nRBC: 0 % (ref 0.0–0.2)

## 2020-01-26 LAB — PREGNANCY, URINE: Preg Test, Ur: NEGATIVE

## 2020-01-26 LAB — MAGNESIUM: Magnesium: 1.9 mg/dL (ref 1.7–2.4)

## 2020-01-26 MED ORDER — SODIUM CHLORIDE 0.9 % IV SOLN
600.0000 mg/m2 | Freq: Once | INTRAVENOUS | Status: AC
  Administered 2020-01-26: 1000 mg via INTRAVENOUS
  Filled 2020-01-26: qty 50

## 2020-01-26 MED ORDER — SODIUM CHLORIDE 0.9 % IV SOLN
150.0000 mg | Freq: Once | INTRAVENOUS | Status: AC
  Administered 2020-01-26: 150 mg via INTRAVENOUS
  Filled 2020-01-26: qty 5

## 2020-01-26 MED ORDER — PALONOSETRON HCL INJECTION 0.25 MG/5ML
0.2500 mg | Freq: Once | INTRAVENOUS | Status: AC
  Administered 2020-01-26: 0.25 mg via INTRAVENOUS
  Filled 2020-01-26: qty 5

## 2020-01-26 MED ORDER — SODIUM CHLORIDE 0.9 % IV SOLN
10.0000 mg | Freq: Once | INTRAVENOUS | Status: AC
  Administered 2020-01-26: 10 mg via INTRAVENOUS
  Filled 2020-01-26: qty 10

## 2020-01-26 MED ORDER — LIDOCAINE-PRILOCAINE 2.5-2.5 % EX CREA: TOPICAL_CREAM | CUTANEOUS | 2 refills | Status: DC

## 2020-01-26 MED ORDER — DOXORUBICIN HCL CHEMO IV INJECTION 2 MG/ML
60.0000 mg/m2 | Freq: Once | INTRAVENOUS | Status: AC
  Administered 2020-01-26: 100 mg via INTRAVENOUS
  Filled 2020-01-26: qty 50

## 2020-01-26 MED ORDER — HEPARIN SOD (PORK) LOCK FLUSH 100 UNIT/ML IV SOLN
500.0000 [IU] | Freq: Once | INTRAVENOUS | Status: AC | PRN
  Administered 2020-01-26: 500 [IU]
  Filled 2020-01-26: qty 5

## 2020-01-26 MED ORDER — SODIUM CHLORIDE 0.9 % IV SOLN
Freq: Once | INTRAVENOUS | Status: AC
  Filled 2020-01-26: qty 250

## 2020-01-26 MED ORDER — POTASSIUM CHLORIDE ER 10 MEQ PO TBCR: 10.0000 meq | EXTENDED_RELEASE_TABLET | ORAL | 0 refills | Status: DC

## 2020-01-26 MED ORDER — HEPARIN SOD (PORK) LOCK FLUSH 100 UNIT/ML IV SOLN
INTRAVENOUS | Status: AC
  Filled 2020-01-26: qty 5

## 2020-01-27 ENCOUNTER — Inpatient Hospital Stay: Payer: BC Managed Care – PPO

## 2020-01-27 DIAGNOSIS — C50511 Malignant neoplasm of lower-outer quadrant of right female breast: Secondary | ICD-10-CM | POA: Diagnosis not present

## 2020-01-27 DIAGNOSIS — C50919 Malignant neoplasm of unspecified site of unspecified female breast: Secondary | ICD-10-CM

## 2020-01-27 DIAGNOSIS — D509 Iron deficiency anemia, unspecified: Secondary | ICD-10-CM

## 2020-01-27 MED ORDER — HEPARIN SOD (PORK) LOCK FLUSH 100 UNIT/ML IV SOLN
500.0000 [IU] | Freq: Once | INTRAVENOUS | Status: AC | PRN
  Administered 2020-01-27: 500 [IU]
  Filled 2020-01-27: qty 5

## 2020-01-27 MED ORDER — PEGFILGRASTIM-JMDB 6 MG/0.6ML ~~LOC~~ SOSY
6.0000 mg | PREFILLED_SYRINGE | Freq: Once | SUBCUTANEOUS | Status: AC
  Administered 2020-01-27: 6 mg via SUBCUTANEOUS
  Filled 2020-01-27: qty 0.6

## 2020-01-27 MED ORDER — SODIUM CHLORIDE 0.9 % IV SOLN
Freq: Once | INTRAVENOUS | Status: DC
  Filled 2020-01-27: qty 250

## 2020-01-27 MED ORDER — SODIUM CHLORIDE 0.9 % IV SOLN
Freq: Once | INTRAVENOUS | Status: AC
  Filled 2020-01-27: qty 250

## 2020-01-29 ENCOUNTER — Ambulatory Visit: Payer: BC Managed Care – PPO

## 2020-01-29 ENCOUNTER — Other Ambulatory Visit: Payer: Self-pay

## 2020-01-29 ENCOUNTER — Inpatient Hospital Stay: Payer: BC Managed Care – PPO

## 2020-01-29 VITALS — BP 108/70 | HR 98 | Temp 99.0°F | Resp 18

## 2020-01-29 DIAGNOSIS — E860 Dehydration: Secondary | ICD-10-CM

## 2020-01-29 DIAGNOSIS — D509 Iron deficiency anemia, unspecified: Secondary | ICD-10-CM

## 2020-01-29 DIAGNOSIS — C50511 Malignant neoplasm of lower-outer quadrant of right female breast: Secondary | ICD-10-CM | POA: Diagnosis not present

## 2020-01-29 MED ORDER — SODIUM CHLORIDE 0.9 % IV SOLN
Freq: Once | INTRAVENOUS | Status: AC
  Filled 2020-01-29: qty 250

## 2020-01-29 MED ORDER — HEPARIN SOD (PORK) LOCK FLUSH 100 UNIT/ML IV SOLN
500.0000 [IU] | Freq: Once | INTRAVENOUS | Status: AC
  Administered 2020-01-29: 500 [IU] via INTRAVENOUS
  Filled 2020-01-29: qty 5

## 2020-01-29 MED ORDER — HEPARIN SOD (PORK) LOCK FLUSH 100 UNIT/ML IV SOLN
INTRAVENOUS | Status: AC
  Filled 2020-01-29: qty 5

## 2020-01-29 NOTE — Progress Notes (Signed)
Fluid orders reviewed with Sonia Baller, NP. Per NP to give pt 1L of NS over one hour. Pt updated and agrees to 1 L of NS over 1hr. All questions answered at this time. VSS.   Olamae Ferrara [employer]

## 2020-02-02 ENCOUNTER — Inpatient Hospital Stay: Payer: BC Managed Care – PPO

## 2020-02-02 ENCOUNTER — Other Ambulatory Visit: Payer: Self-pay

## 2020-02-02 VITALS — BP 95/65 | HR 100 | Temp 98.8°F | Resp 18

## 2020-02-02 DIAGNOSIS — C50511 Malignant neoplasm of lower-outer quadrant of right female breast: Secondary | ICD-10-CM | POA: Diagnosis not present

## 2020-02-02 DIAGNOSIS — D509 Iron deficiency anemia, unspecified: Secondary | ICD-10-CM

## 2020-02-02 MED ORDER — SODIUM CHLORIDE 0.9% FLUSH
10.0000 mL | Freq: Once | INTRAVENOUS | Status: AC | PRN
  Administered 2020-02-02: 10 mL
  Filled 2020-02-02: qty 10

## 2020-02-02 MED ORDER — SODIUM CHLORIDE 0.9 % IV SOLN
Freq: Once | INTRAVENOUS | Status: AC
  Filled 2020-02-02: qty 250

## 2020-02-02 MED ORDER — HEPARIN SOD (PORK) LOCK FLUSH 100 UNIT/ML IV SOLN
500.0000 [IU] | Freq: Once | INTRAVENOUS | Status: AC | PRN
  Administered 2020-02-02: 500 [IU]
  Filled 2020-02-02: qty 5

## 2020-02-03 ENCOUNTER — Inpatient Hospital Stay: Payer: BC Managed Care – PPO

## 2020-02-03 DIAGNOSIS — C50511 Malignant neoplasm of lower-outer quadrant of right female breast: Secondary | ICD-10-CM | POA: Diagnosis not present

## 2020-02-03 DIAGNOSIS — D509 Iron deficiency anemia, unspecified: Secondary | ICD-10-CM

## 2020-02-03 MED ORDER — SODIUM CHLORIDE 0.9% FLUSH
10.0000 mL | Freq: Once | INTRAVENOUS | Status: AC | PRN
  Administered 2020-02-03: 10 mL
  Filled 2020-02-03: qty 10

## 2020-02-03 MED ORDER — HEPARIN SOD (PORK) LOCK FLUSH 100 UNIT/ML IV SOLN
500.0000 [IU] | Freq: Once | INTRAVENOUS | Status: AC | PRN
  Administered 2020-02-03: 500 [IU]
  Filled 2020-02-03: qty 5

## 2020-02-03 MED ORDER — SODIUM CHLORIDE 0.9 % IV SOLN
Freq: Once | INTRAVENOUS | Status: AC
  Filled 2020-02-03: qty 250

## 2020-02-05 ENCOUNTER — Other Ambulatory Visit: Payer: Self-pay

## 2020-02-05 ENCOUNTER — Inpatient Hospital Stay: Payer: BC Managed Care – PPO

## 2020-02-05 VITALS — BP 95/64 | HR 99 | Temp 97.8°F | Resp 16

## 2020-02-05 DIAGNOSIS — D509 Iron deficiency anemia, unspecified: Secondary | ICD-10-CM

## 2020-02-05 DIAGNOSIS — C50511 Malignant neoplasm of lower-outer quadrant of right female breast: Secondary | ICD-10-CM | POA: Diagnosis not present

## 2020-02-05 MED ORDER — SODIUM CHLORIDE 0.9 % IV SOLN
Freq: Once | INTRAVENOUS | Status: AC
  Filled 2020-02-05: qty 250

## 2020-02-05 MED ORDER — HEPARIN SOD (PORK) LOCK FLUSH 100 UNIT/ML IV SOLN
500.0000 [IU] | Freq: Once | INTRAVENOUS | Status: AC | PRN
  Administered 2020-02-05: 500 [IU]
  Filled 2020-02-05: qty 5

## 2020-02-05 MED ORDER — SODIUM CHLORIDE 0.9% FLUSH
10.0000 mL | Freq: Once | INTRAVENOUS | Status: DC | PRN
  Filled 2020-02-05: qty 10

## 2020-02-05 MED ORDER — HEPARIN SOD (PORK) LOCK FLUSH 100 UNIT/ML IV SOLN
INTRAVENOUS | Status: AC
  Filled 2020-02-05: qty 5

## 2020-02-09 ENCOUNTER — Other Ambulatory Visit: Payer: Self-pay

## 2020-02-09 ENCOUNTER — Inpatient Hospital Stay: Payer: BC Managed Care – PPO

## 2020-02-09 VITALS — BP 104/67 | HR 84 | Temp 98.0°F | Resp 16

## 2020-02-09 DIAGNOSIS — D509 Iron deficiency anemia, unspecified: Secondary | ICD-10-CM

## 2020-02-09 DIAGNOSIS — C50511 Malignant neoplasm of lower-outer quadrant of right female breast: Secondary | ICD-10-CM | POA: Diagnosis not present

## 2020-02-09 MED ORDER — SODIUM CHLORIDE 0.9 % IV SOLN
Freq: Once | INTRAVENOUS | Status: AC
  Filled 2020-02-09: qty 250

## 2020-02-09 MED ORDER — HEPARIN SOD (PORK) LOCK FLUSH 100 UNIT/ML IV SOLN
500.0000 [IU] | Freq: Once | INTRAVENOUS | Status: AC | PRN
  Administered 2020-02-09: 500 [IU]
  Filled 2020-02-09: qty 5

## 2020-02-09 MED ORDER — SODIUM CHLORIDE 0.9% FLUSH
10.0000 mL | Freq: Once | INTRAVENOUS | Status: AC | PRN
  Administered 2020-02-09: 10 mL
  Filled 2020-02-09: qty 10

## 2020-02-10 ENCOUNTER — Inpatient Hospital Stay: Payer: BC Managed Care – PPO

## 2020-02-10 VITALS — BP 96/64 | HR 86 | Temp 99.0°F | Resp 18

## 2020-02-10 DIAGNOSIS — D509 Iron deficiency anemia, unspecified: Secondary | ICD-10-CM

## 2020-02-10 DIAGNOSIS — C50511 Malignant neoplasm of lower-outer quadrant of right female breast: Secondary | ICD-10-CM | POA: Diagnosis not present

## 2020-02-10 MED ORDER — HEPARIN SOD (PORK) LOCK FLUSH 100 UNIT/ML IV SOLN
500.0000 [IU] | Freq: Once | INTRAVENOUS | Status: AC | PRN
  Administered 2020-02-10: 500 [IU]
  Filled 2020-02-10: qty 5

## 2020-02-10 MED ORDER — SODIUM CHLORIDE 0.9 % IV SOLN
Freq: Once | INTRAVENOUS | Status: AC
  Filled 2020-02-10: qty 250

## 2020-02-10 MED ORDER — HEPARIN SOD (PORK) LOCK FLUSH 100 UNIT/ML IV SOLN
INTRAVENOUS | Status: AC
  Filled 2020-02-10: qty 5

## 2020-02-12 ENCOUNTER — Ambulatory Visit: Payer: BC Managed Care – PPO

## 2020-02-12 ENCOUNTER — Inpatient Hospital Stay: Payer: BC Managed Care – PPO

## 2020-02-12 ENCOUNTER — Other Ambulatory Visit: Payer: Self-pay

## 2020-02-12 VITALS — BP 92/66 | HR 91 | Temp 97.2°F | Resp 18

## 2020-02-12 DIAGNOSIS — E860 Dehydration: Secondary | ICD-10-CM

## 2020-02-12 DIAGNOSIS — C50511 Malignant neoplasm of lower-outer quadrant of right female breast: Secondary | ICD-10-CM | POA: Diagnosis not present

## 2020-02-12 MED ORDER — HEPARIN SOD (PORK) LOCK FLUSH 100 UNIT/ML IV SOLN
500.0000 [IU] | Freq: Once | INTRAVENOUS | Status: AC
  Administered 2020-02-12: 500 [IU] via INTRAVENOUS
  Filled 2020-02-12: qty 5

## 2020-02-12 MED ORDER — SODIUM CHLORIDE 0.9 % IV SOLN
Freq: Once | INTRAVENOUS | Status: AC
  Filled 2020-02-12: qty 250

## 2020-02-12 MED ORDER — HEPARIN SOD (PORK) LOCK FLUSH 100 UNIT/ML IV SOLN
INTRAVENOUS | Status: AC
  Filled 2020-02-12: qty 5

## 2020-02-12 NOTE — Progress Notes (Signed)
Per NP to give 1L of NS over 1 hour. Pt updated. VSS.   Heather Bell [employer]

## 2020-02-15 DIAGNOSIS — D6481 Anemia due to antineoplastic chemotherapy: Secondary | ICD-10-CM | POA: Insufficient documentation

## 2020-02-15 DIAGNOSIS — T451X5A Adverse effect of antineoplastic and immunosuppressive drugs, initial encounter: Secondary | ICD-10-CM | POA: Insufficient documentation

## 2020-02-15 NOTE — Progress Notes (Signed)
[facility]  [address], [address] [place], [place] [address] Phone: [phone]  Fax: [phone]   Clinic Day:  02/16/2020  Referring physician: [facility], [place]  Chief Complaint: Heather Bell is a 44 y.o. female with clinical stage IIBtriple negativerightbreast cancer who is seen for assessment prior to cycle #4 AC.   HPI: The patient was last seen in the medical oncology clinic on 01/26/2020. At that time, she felt "ok".  She felt better than after cycle #1 because of IVFs. She had less nausea.  Hematocrit was 31.0, hemoglobin 10.2, MCV 98.4, platelets 350,000, WBC 8,600. CMP was normal.  She received cycle #3 AC with Fulphila support.  The patient received IVF on 02/02/2020, 02/03/2020, 02/05/2020, 02/09/2020, 02/10/2020, and 02/12/2020.  During the interim, she has been "okay." She is eating well but struggles to drink enough fluids. She can barely palpate the lump in her right breast. She has occasional episodes of nausea but has not needed any medication.  She denies shortness of breath, chest pain, leg swelling, orthopnea, vomiting, diarrhea, mouth sores, and tenderness.  The patient sees Dr. Windell Moment. She is going to [place] from 07/24-07/29/2021. She does not know if she needs fluids less frequently.   Past Medical History:  Diagnosis Date  . Allergic rhinitis   . Anxiety   . Bipolar disorder ([place])    depression  . Cancer [facility])    breast right  . Depression   . Diabetes ([place])   . GERD (gastroesophageal reflux disease)   . Hyperlipidemia     Past Surgical History:  Procedure Laterality Date  . BREAST BIOPSY Right 07/22/2019   mass bx, path pending, heart marker  . BREAST BIOPSY Right 07/22/2019   LN bx, path pending,  butterfly hydromarker  . IMAGE GUIDED SINUS SURGERY    . KNEE SURGERY    . NASAL SEPTUM SURGERY    . PORTACATH PLACEMENT N/A 08/07/2019   Procedure: INSERTION PORT-A-CATH;  Surgeon: Herbert Pun, MD;  Location: [facility] ORS;  Service: General;  Laterality: N/A;  . TONSILLECTOMY      Family History  Problem Relation Age of Onset  . Lung cancer Father   . Heart attack Father   . Lung cancer Paternal Uncle   . Heart disease Paternal Uncle   . Lung cancer Paternal Aunt     Social History:  reports that she has never smoked. She has never used smokeless tobacco. She reports that she does not drink alcohol and does not use drugs. Deniesany exposure to radiation or toxins.She has 2 children (8 year old daughter and 20 year old son).She use to work inthefast foodindustry. She is currently not working. Her husbandisMichael.Michaelused totravel a lot for his job.Her husband found a new job closer to home in [place], [place]. The patient is alone today.   Allergies:  Allergies  Allergen Reactions  . Compazine [Prochlorperazine Edisylate] Other (See Comments)    Acute dystonic reaction  . Morphine And Related Shortness Of Breath  . Doxycycline Other (See Comments)    "made all my symptoms worse"   . Fluticasone Other (See Comments)    Doesn't work  . Levofloxacin Other (See Comments)    "made all my symptoms worse"     Current Medications: Current Outpatient Medications  Medication Sig Dispense Refill  . amphetamine-dextroamphetamine (ADDERALL XR) 25 MG 24 hr capsule Take 25 mg by mouth daily.    . Calcium Carbonate-Vitamin D (CALCIUM 500 + D) 500-125 MG-UNIT TABS  Take 1 tablet by mouth daily.    . cetirizine (ZYRTEC) 10 MG tablet Take 10 mg by mouth daily.     Marland Kitchen dexamethasone (DECADRON) 4 MG tablet Take 2 tablets by mouth once a day on the day after chemotherapy then 2 tablets two times a day for 2 days.  Take with food. 20 tablet 1  . doxepin (SINEQUAN) 25 MG capsule Take 25 mg by mouth at bedtime.     Marland Kitchen ibuprofen (ADVIL) 200 MG tablet Take 400-800 mg by mouth every 6 (six) hours as needed for headache or moderate pain.    Marland Kitchen lamoTRIgine (LAMICTAL) 200 MG tablet Take  200 mg by mouth at bedtime.     . lidocaine-prilocaine (EMLA) cream Apply topically over port-a-cath 30 minutes to 1 hour prior to treatment. 30 g 2  . loratadine (CLARITIN) 10 MG tablet Take 10 mg by mouth daily.    Marland Kitchen lurasidone (LATUDA) 40 MG TABS tablet Take 40 mg by mouth at bedtime. Take with 60 mg to equal 100 mg at night    . Lurasidone HCl 60 MG TABS Take 60 mg by mouth at bedtime. Take with 40 mg to equal 100 mg at night    . metFORMIN (GLUCOPHAGE-XR) 500 MG 24 hr tablet Take 1,000 mg by mouth 2 (two) times daily.    . mometasone (NASONEX) 50 MCG/ACT nasal spray Place 2 sprays into the nose daily as needed (allergies).     . Multiple Vitamin (MULTIVITAMIN WITH MINERALS) TABS tablet Take 1 tablet by mouth daily.    Marland Kitchen omeprazole (PRILOSEC) 20 MG capsule Take 20 mg by mouth daily.     . potassium chloride (KLOR-CON) 10 MEQ tablet Take 1 tablet (10 mEq total) by mouth as directed. Take 1 tablet daily as directed by MD. 30 tablet 0  . QUEtiapine (SEROQUEL XR) 300 MG 24 hr tablet Take 300 mg by mouth at bedtime.    . simvastatin (ZOCOR) 20 MG tablet Take 20 mg by mouth daily.    Marland Kitchen topiramate (TOPAMAX) 100 MG tablet Take 50-100 mg by mouth See admin instructions. Take 100 mg in the morning and 50 mg at night    . vitamin B-12 (CYANOCOBALAMIN) 1000 MCG tablet Take 1,000 mcg by mouth daily.    . ondansetron (ZOFRAN) 8 MG tablet Take 1 tablet (8 mg total) by mouth 2 (two) times daily as needed for refractory nausea / vomiting. Start on day 3 after carboplatin chemo. (Patient not taking: Reported on 02/16/2020) 30 tablet 1   No current facility-administered medications for this visit.   Facility-Administered Medications Ordered in Other Visits  Medication Dose Route Frequency Provider Last Rate Last Admin  . 0.9 %  sodium chloride infusion   Intravenous Continuous Lequita Asal, MD   Stopped at 01/08/20 1516  . heparin lock flush 100 unit/mL  500 Units Intravenous Once Pernell Dikes C, MD       . sodium chloride flush (NS) 0.9 % injection 10 mL  10 mL Intravenous PRN Nolon Stalls C, MD   10 mL at 01/08/20 1314  . sodium chloride flush (NS) 0.9 % injection 10 mL  10 mL Intravenous PRN Lequita Asal, MD   10 mL at 02/16/20 0854    Review of Systems  Constitutional: Negative for chills, diaphoresis, fever, malaise/fatigue and weight loss (up 1 lb).       Doing "okay." Same as last time.  HENT: Negative for congestion, ear discharge, ear pain, hearing loss, nosebleeds,  sinus pain, sore throat and tinnitus.   Eyes: Negative.  Negative for blurred vision, double vision and photophobia.  Respiratory: Negative for cough, hemoptysis, sputum production and shortness of breath.   Cardiovascular: Negative.  Negative for chest pain, palpitations, orthopnea and leg swelling.  Gastrointestinal: Positive for nausea (occasional). Negative for abdominal pain, blood in stool (hemorrhoids), constipation, diarrhea, heartburn (on antacids), melena and vomiting.       Eating well. Struggles to drink enough fluids.  Genitourinary: Negative for dysuria, frequency, hematuria and urgency.       Premenopausal.   Musculoskeletal: Negative for back pain, joint pain, myalgias and neck pain.  Skin: Negative for itching and rash.       RIGHT breast lump barely palpable.  Neurological: Negative for dizziness, tingling, sensory change, weakness and headaches.  Endo/Heme/Allergies: Does not bruise/bleed easily.       Type II diabetes.   Psychiatric/Behavioral: Negative for depression and memory loss. The patient is not nervous/anxious and does not have insomnia.   All other systems reviewed and are negative.  Performance status (ECOG): 0-1  Vitals Blood pressure 103/60, pulse 92, temperature (!) 96.6 F (35.9 C), temperature source Tympanic, resp. rate 18, weight 141 lb 1.5 oz (64 kg), SpO2 100 %.   Physical Exam Vitals and nursing note reviewed.  Constitutional:      General: She is not in  acute distress.    Appearance: She is well-developed. She is not diaphoretic.  HENT:     Head: Normocephalic and atraumatic.     Comments: Black wrap.    Mouth/Throat:     Mouth: Mucous membranes are moist.     Pharynx: Oropharynx is clear. No oropharyngeal exudate.  Eyes:     General: No scleral icterus.    Extraocular Movements: Extraocular movements intact.     Conjunctiva/sclera: Conjunctivae normal.     Pupils: Pupils are equal, round, and reactive to light.     Comments: Brown eyes.  Cardiovascular:     Rate and Rhythm: Normal rate and regular rhythm.     Pulses: Normal pulses.     Heart sounds: Normal heart sounds. No murmur heard.   Pulmonary:     Effort: Pulmonary effort is normal. No respiratory distress.     Breath sounds: Normal breath sounds. No wheezing or rales.  Chest:     Chest wall: No tenderness.  Abdominal:     General: Bowel sounds are normal. There is no distension.     Palpations: Abdomen is soft. There is no mass.     Tenderness: There is no abdominal tenderness. There is no guarding or rebound.  Musculoskeletal:        General: No swelling or tenderness. Normal range of motion.     Cervical back: Normal range of motion and neck supple.  Lymphadenopathy:     Head:     Right side of head: No preauricular, posterior auricular or occipital adenopathy.     Left side of head: No preauricular, posterior auricular or occipital adenopathy.     Cervical: No cervical adenopathy.     Upper Body:     Right upper body: No supraclavicular or axillary adenopathy.     Left upper body: No supraclavicular or axillary adenopathy.     Lower Body: No right inguinal adenopathy. No left inguinal adenopathy.  Skin:    General: Skin is warm and dry.     Findings: No erythema.  Neurological:     Mental Status: She is alert and  oriented to person, place, and time.  Psychiatric:        Mood and Affect: Mood normal.        Behavior: Behavior normal.        Thought Content:  Thought content normal.        Judgment: Judgment normal.     Infusion on 02/16/2020  Component Date Value Ref Range Status  . Magnesium 02/16/2020 1.9  1.7 - 2.4 mg/dL Final   Performed at [facility], [address] [place], [place] [address]  . Sodium 02/16/2020 138  135 - 145 mmol/L Final  . Potassium 02/16/2020 3.8  3.5 - 5.1 mmol/L Final  . Chloride 02/16/2020 106  98 - 111 mmol/L Final  . CO2 02/16/2020 24  22 - 32 mmol/L Final  . Glucose, Bld 02/16/2020 125* 70 - 99 mg/dL Final   Glucose reference range applies only to samples taken after fasting for at least 8 hours.  . BUN 02/16/2020 13  6 - 20 mg/dL Final  . Creatinine, Ser 02/16/2020 0.70  0.44 - 1.00 mg/dL Final  . Calcium 02/16/2020 9.1  8.9 - 10.3 mg/dL Final  . Total Protein 02/16/2020 6.9  6.5 - 8.1 g/dL Final  . Albumin 02/16/2020 4.0  3.5 - 5.0 g/dL Final  . AST 02/16/2020 17  15 - 41 U/L Final  . ALT 02/16/2020 15  0 - 44 U/L Final  . Alkaline Phosphatase 02/16/2020 86  38 - 126 U/L Final  . Total Bilirubin 02/16/2020 0.4  0.3 - 1.2 mg/dL Final  . GFR calc non Af Amer 02/16/2020 >60  >60 mL/min Final  . GFR calc Af Amer 02/16/2020 >60  >60 mL/min Final  . Anion gap 02/16/2020 8  5 - 15 Final   Performed at [facility], [address] [place], [place] [address]  . WBC 02/16/2020 7.7  4.0 - 10.5 K/uL Final  . RBC 02/16/2020 3.35* 3.87 - 5.11 MIL/uL Final  . Hemoglobin 02/16/2020 10.5* 12.0 - 15.0 g/dL Final  . HCT 02/16/2020 32.3* 36 - 46 % Final  . MCV 02/16/2020 96.4  80.0 - 100.0 fL Final  . MCH 02/16/2020 31.3  26.0 - 34.0 pg Final  . MCHC 02/16/2020 32.5  30.0 - 36.0 g/dL Final  . RDW 02/16/2020 13.6  11.5 - 15.5 % Final  . Platelets 02/16/2020 308  150 - 400 K/uL Final  . nRBC 02/16/2020 0.0  0.0 - 0.2 % Final  . Neutrophils Relative % 02/16/2020 68  % Final  . Neutro Abs 02/16/2020 5.2  1.7 - 7.7 K/uL Final  . Lymphocytes Relative 02/16/2020 20  % Final  . Lymphs Abs  02/16/2020 1.6  0.7 - 4.0 K/uL Final  . Monocytes Relative 02/16/2020 10  % Final  . Monocytes Absolute 02/16/2020 0.8  0 - 1 K/uL Final  . Eosinophils Relative 02/16/2020 0  % Final  . Eosinophils Absolute 02/16/2020 0.0  0 - 0 K/uL Final  . Basophils Relative 02/16/2020 1  % Final  . Basophils Absolute 02/16/2020 0.1  0 - 0 K/uL Final  . Immature Granulocytes 02/16/2020 1  % Final  . Abs Immature Granulocytes 02/16/2020 0.07  0.00 - 0.07 K/uL Final   Performed at [facility], [address] [place], [place] [address]  . Preg Test, Ur 02/16/2020 NEGATIVE  NEGATIVE Final   Performed at [facility], [address] [place], [place] [address]    Assessment:  KENNLEY SCHWANDT is a 44 y.o. female with clinical T1cN1triple negativerightbreast cancers/p ultrasound guided core biopsy. Pathology revealeda grade IIIinvasive mammary carcinoma, no special type of the right breast. Specimen was 10 mm.Ductal carcinoma in situ(DCIS)was not identified.Right axillarylymph nodewas positive for metastatic carcinoma.Tumor was ER - (<1%), PR - (<1%), and Her2/neu -.  Bilateral mammogram and right unilateral ultrasoundon 07/10/2019 revealed a 1.7 x 1.6 x 1.6 cmmass at the7 o'clock region of the right breast 7 cm from the nipple. There was an abnormal1.4 x 0.7 x 0.9 cmaxillary lymph node. Therewas a second3 mmaxillary lymph node with minimal focal cortical thickening.An ultrasound-guided core biopsies of the mass and the enlarged right axillary lymph node was recommended. BI-RADS category 5.  PET scanon 08/11/2019 showed a hypermetabolic mass in the inferior RIGHT breast consistentwith herbreast carcinoma. There was noevidence hypermetabolic nodal metastasis.There was no distant metastatic disease.There was nopulmonary metastasis or skeletal metastasis.  E[place]oon 12/24/2020revealedan ejection fraction of50 to 55%. The left ventricle demonstrated  global hypokinesis.MUGAon 08/24/2019 revealed an EF of 60%.MUGAon 12/08/2019 revealed an EF of57.4%.  MUGA on 01/22/2020 revealed an EF of 56.5%.  She is followed by Dr End.  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  Shereceived 12 weeksof Taxol + carboplatin(08/25/2019 -11/24/2019).Carboplatin was held on week #7 and 8 secondary to marginal counts. She received Granix on 10/28/2019 and 11/20/2019.  She is s/p 3 cycles of AC (12/15/2019 - 01/26/2020) with Fulphila (pegfilgrastim-jmdb) support.  She has required IVF support.  Breast masswas larger after initiation of treatment felt secondary to post biopsy hematoma. Right breast ultrasoundon 09/07/2019 revealed the mass in the 7 o'clock position contained cystic components and measured 3 cm (compared to 1.7 cm on 07/10/2019). The biopsied lymph node with 3 mm cortical thickening in the axilla was similar. Right breast ultrasoundon 10/09/2019 revealed interval decrease in size of RIGHT breast mass (1.4 x 1.7 x 1.4 cm). Mass was less well defined and associated with parenchymal edema.Right breast ultrasoundon 12/03/2019 revealed a 1.3 x 1.5 x 1.2 cm irregular hypoechoic mass right breast 7 o'clock position 7 cm from nipple.  She is premenopausal. She stopped taking birth control pills on 08/17/2019.  She has iron deficiency. Ferritin was 21 with an iron saturation of 7% and a TIBC 343 on 09/08/2019.She received Venoferweekly x 3 (09/18/2019 - 10/06/2019).  She has B12 deficiency. B12 was 240 on 01/19/2021and 680 on 11/24/2019.She is on oral B12.  Folate was 62.7 on 09/08/2019.  She has recurrent orthostatic hypotension. Fluid intake is marginal. AM cortisol was 5.3 (low) on 09/22/2019 suggestive of adrenal insufficiency.ACTH stim testwas negative.  Cortisol was 9.0 on 12/24/2019.   Symptomatically,  she is doing well.  She denies any concerns.  Exam is stable.  Plan: 1.    Labs today: CBC with diff, CMP, Mg. 2.Clinical stageIIB triple negativeright breast cancer  Patient is undergoingneoadjuvant chemotherapy. She completed Taxol + carboplatin x 12 (last 12/15/2019). She is s/p 3 cycles of AC (last 01/26/2020) with Filphila suppot. Cycle #1 noted for general weakness and dehydration requiring IVF support.    She developed neutropenia with cycle #1.   Nadir counts s/p cycle #2 were normal.   She receives chemotherapy every 3 weeks.  Labs reviewed.  Begin cycle #4 AC.  Discuss plans for surgery in approximately 3-4 weeks.   Contact Dr. Windell Moment. 3. Chemotherapy induced anemia Hematocrit 32.3, hemoglobin1.5, MCV96.4today. Ferritin was 89 on 11/24/2019. Anticipate counts to improve  after recovery from last cycle of chemotherapy. 4. B12 deficiency Patient on oral B12.  B12 was 240 on 01/19/2021and 680 on 11/24/2019. Folate was 62.7 on 09/08/2019.  Monitor annually. 5.Chronic dehydration Cortisol level was 9.0 (normal) on 12/24/2019.  Lipids today.   RTC on Tuesdays, Wednesdays and Fridays for fluids x3 weeks 6.   Cycle #4 AC today. 7.   RTC tomorrow for Fulphila. 8.   RTC on Tuesdays, Wednesdays, and Fridays for fluids x 3 weeks. 9.   Please schedule appt with Dr Windell Moment re: plans for upcoming surgery. 10.   RTC in 3 weeks for MD assessment, labs (CBC with diff, CMP, Mg), and discussion regarding surgery.  I discussed the assessment and treatment plan with the patient.  The patient was provided an opportunity to ask questions and all were answered.  The patient agreed with the plan and demonstrated an understanding of the instructions.  The patient was advised to call back if the symptoms worsen or if the  condition fails to improve as anticipated.     Lequita Asal, MD, PhD    02/16/2020, 9:29 AM  I, De Burrs, am acting as a Education administrator for [employer]. Mike Gip, MD.   I, Tayvin Preslar C. Mike Gip, MD, have reviewed the above documentation for accuracy and completeness, and I agree with the above.

## 2020-02-16 ENCOUNTER — Other Ambulatory Visit: Payer: Self-pay

## 2020-02-16 ENCOUNTER — Encounter: Payer: Self-pay | Admitting: Hematology and Oncology

## 2020-02-16 ENCOUNTER — Inpatient Hospital Stay: Payer: BC Managed Care – PPO

## 2020-02-16 ENCOUNTER — Inpatient Hospital Stay (HOSPITAL_BASED_OUTPATIENT_CLINIC_OR_DEPARTMENT_OTHER): Payer: BC Managed Care – PPO | Admitting: Hematology and Oncology

## 2020-02-16 VITALS — BP 103/60 | HR 92 | Temp 96.6°F | Resp 18 | Wt 141.1 lb

## 2020-02-16 VITALS — BP 106/62 | HR 88 | Temp 97.6°F | Resp 16

## 2020-02-16 DIAGNOSIS — D509 Iron deficiency anemia, unspecified: Secondary | ICD-10-CM

## 2020-02-16 DIAGNOSIS — E860 Dehydration: Secondary | ICD-10-CM

## 2020-02-16 DIAGNOSIS — Z171 Estrogen receptor negative status [ER-]: Secondary | ICD-10-CM

## 2020-02-16 DIAGNOSIS — Z5111 Encounter for antineoplastic chemotherapy: Secondary | ICD-10-CM

## 2020-02-16 DIAGNOSIS — E538 Deficiency of other specified B group vitamins: Secondary | ICD-10-CM

## 2020-02-16 DIAGNOSIS — D649 Anemia, unspecified: Secondary | ICD-10-CM

## 2020-02-16 DIAGNOSIS — D6481 Anemia due to antineoplastic chemotherapy: Secondary | ICD-10-CM

## 2020-02-16 DIAGNOSIS — C50511 Malignant neoplasm of lower-outer quadrant of right female breast: Secondary | ICD-10-CM | POA: Diagnosis not present

## 2020-02-16 DIAGNOSIS — C50919 Malignant neoplasm of unspecified site of unspecified female breast: Secondary | ICD-10-CM

## 2020-02-16 DIAGNOSIS — T451X5A Adverse effect of antineoplastic and immunosuppressive drugs, initial encounter: Secondary | ICD-10-CM

## 2020-02-16 LAB — CBC WITH DIFFERENTIAL/PLATELET
Abs Immature Granulocytes: 0.07 10*3/uL (ref 0.00–0.07)
Basophils Absolute: 0.1 10*3/uL (ref 0.0–0.1)
Basophils Relative: 1 %
Eosinophils Absolute: 0 10*3/uL (ref 0.0–0.5)
Eosinophils Relative: 0 %
HCT: 32.3 % — ABNORMAL LOW (ref 36.0–46.0)
Hemoglobin: 10.5 g/dL — ABNORMAL LOW (ref 12.0–15.0)
Immature Granulocytes: 1 %
Lymphocytes Relative: 20 %
Lymphs Abs: 1.6 10*3/uL (ref 0.7–4.0)
MCH: 31.3 pg (ref 26.0–34.0)
MCHC: 32.5 g/dL (ref 30.0–36.0)
MCV: 96.4 fL (ref 80.0–100.0)
Monocytes Absolute: 0.8 10*3/uL (ref 0.1–1.0)
Monocytes Relative: 10 %
Neutro Abs: 5.2 10*3/uL (ref 1.7–7.7)
Neutrophils Relative %: 68 %
Platelets: 308 10*3/uL (ref 150–400)
RBC: 3.35 MIL/uL — ABNORMAL LOW (ref 3.87–5.11)
RDW: 13.6 % (ref 11.5–15.5)
WBC: 7.7 10*3/uL (ref 4.0–10.5)
nRBC: 0 % (ref 0.0–0.2)

## 2020-02-16 LAB — COMPREHENSIVE METABOLIC PANEL
ALT: 15 U/L (ref 0–44)
AST: 17 U/L (ref 15–41)
Albumin: 4 g/dL (ref 3.5–5.0)
Alkaline Phosphatase: 86 U/L (ref 38–126)
Anion gap: 8 (ref 5–15)
BUN: 13 mg/dL (ref 6–20)
CO2: 24 mmol/L (ref 22–32)
Calcium: 9.1 mg/dL (ref 8.9–10.3)
Chloride: 106 mmol/L (ref 98–111)
Creatinine, Ser: 0.7 mg/dL (ref 0.44–1.00)
GFR calc Af Amer: >60 mL/min (ref >60)
GFR calc non Af Amer: >60 mL/min (ref >60)
Glucose, Bld: 125 mg/dL — ABNORMAL HIGH (ref 70–99)
Potassium: 3.8 mmol/L (ref 3.5–5.1)
Sodium: 138 mmol/L (ref 135–145)
Total Bilirubin: 0.4 mg/dL (ref 0.3–1.2)
Total Protein: 6.9 g/dL (ref 6.5–8.1)

## 2020-02-16 LAB — MAGNESIUM: Magnesium: 1.9 mg/dL (ref 1.7–2.4)

## 2020-02-16 LAB — PREGNANCY, URINE: Preg Test, Ur: NEGATIVE

## 2020-02-16 MED ORDER — SODIUM CHLORIDE 0.9% FLUSH
10.0000 mL | INTRAVENOUS | Status: DC | PRN
  Administered 2020-02-16: 10 mL via INTRAVENOUS
  Filled 2020-02-16: qty 10

## 2020-02-16 MED ORDER — SODIUM CHLORIDE 0.9 % IV SOLN
10.0000 mg | Freq: Once | INTRAVENOUS | Status: AC
  Administered 2020-02-16: 10 mg via INTRAVENOUS
  Filled 2020-02-16: qty 10

## 2020-02-16 MED ORDER — DOXORUBICIN HCL CHEMO IV INJECTION 2 MG/ML
60.0000 mg/m2 | Freq: Once | INTRAVENOUS | Status: AC
  Administered 2020-02-16: 100 mg via INTRAVENOUS
  Filled 2020-02-16: qty 50

## 2020-02-16 MED ORDER — SODIUM CHLORIDE 0.9 % IV SOLN
150.0000 mg | Freq: Once | INTRAVENOUS | Status: AC
  Administered 2020-02-16: 150 mg via INTRAVENOUS
  Filled 2020-02-16: qty 5

## 2020-02-16 MED ORDER — SODIUM CHLORIDE 0.9 % IV SOLN
600.0000 mg/m2 | Freq: Once | INTRAVENOUS | Status: AC
  Administered 2020-02-16: 1000 mg via INTRAVENOUS
  Filled 2020-02-16: qty 50

## 2020-02-16 MED ORDER — HEPARIN SOD (PORK) LOCK FLUSH 100 UNIT/ML IV SOLN
500.0000 [IU] | Freq: Once | INTRAVENOUS | Status: AC
  Administered 2020-02-16: 500 [IU] via INTRAVENOUS
  Filled 2020-02-16: qty 5

## 2020-02-16 MED ORDER — PALONOSETRON HCL INJECTION 0.25 MG/5ML
0.2500 mg | Freq: Once | INTRAVENOUS | Status: AC
  Administered 2020-02-16: 0.25 mg via INTRAVENOUS

## 2020-02-16 MED ORDER — SODIUM CHLORIDE 0.9 % IV SOLN
Freq: Once | INTRAVENOUS | Status: AC
  Filled 2020-02-16: qty 250

## 2020-02-17 ENCOUNTER — Inpatient Hospital Stay: Payer: BC Managed Care – PPO

## 2020-02-17 VITALS — BP 99/67 | HR 101 | Temp 97.1°F | Resp 17

## 2020-02-17 DIAGNOSIS — C50919 Malignant neoplasm of unspecified site of unspecified female breast: Secondary | ICD-10-CM

## 2020-02-17 DIAGNOSIS — C50511 Malignant neoplasm of lower-outer quadrant of right female breast: Secondary | ICD-10-CM | POA: Diagnosis not present

## 2020-02-17 DIAGNOSIS — D509 Iron deficiency anemia, unspecified: Secondary | ICD-10-CM

## 2020-02-17 MED ORDER — SODIUM CHLORIDE 0.9 % IV SOLN
Freq: Once | INTRAVENOUS | Status: AC
  Filled 2020-02-17: qty 250

## 2020-02-17 MED ORDER — PEGFILGRASTIM-JMDB 6 MG/0.6ML ~~LOC~~ SOSY
6.0000 mg | PREFILLED_SYRINGE | Freq: Once | SUBCUTANEOUS | Status: AC
  Administered 2020-02-17: 6 mg via SUBCUTANEOUS

## 2020-02-17 MED ORDER — HEPARIN SOD (PORK) LOCK FLUSH 100 UNIT/ML IV SOLN
500.0000 [IU] | Freq: Once | INTRAVENOUS | Status: AC | PRN
  Administered 2020-02-17: 500 [IU]
  Filled 2020-02-17: qty 5

## 2020-02-17 MED ORDER — SODIUM CHLORIDE 0.9% FLUSH
10.0000 mL | Freq: Once | INTRAVENOUS | Status: AC | PRN
  Administered 2020-02-17: 10 mL
  Filled 2020-02-17: qty 10

## 2020-02-17 NOTE — Patient Instructions (Signed)
Pegfilgrastim injection What is this medicine? PEGFILGRASTIM (PEG fil gra stim) is a Herren-acting granulocyte colony-stimulating factor that stimulates the growth of neutrophils, a type of white blood cell important in the body's fight against infection. It is used to reduce the incidence of fever and infection in patients with certain types of cancer who are receiving chemotherapy that affects the bone marrow, and to increase survival after being exposed to high doses of radiation. This medicine may be used for other purposes; ask your health care provider or pharmacist if you have questions. COMMON BRAND NAME(S): Fulphila, Neulasta, UDENYCA, Ziextenzo What should I tell my health care provider before I take this medicine? They need to know if you have any of these conditions:  kidney disease  latex allergy  ongoing radiation therapy  sickle cell disease  skin reactions to acrylic adhesives (On-Body Injector only)  an unusual or allergic reaction to pegfilgrastim, filgrastim, other medicines, foods, dyes, or preservatives  pregnant or trying to get pregnant  breast-feeding How should I use this medicine? This medicine is for injection under the skin. If you get this medicine at home, you will be taught how to prepare and give the pre-filled syringe or how to use the On-body Injector. Refer to the patient Instructions for Use for detailed instructions. Use exactly as directed. Tell your healthcare provider immediately if you suspect that the On-body Injector may not have performed as intended or if you suspect the use of the On-body Injector resulted in a missed or partial dose. It is important that you put your used needles and syringes in a special sharps container. Do not put them in a trash can. If you do not have a sharps container, call your pharmacist or healthcare provider to get one. Talk to your pediatrician regarding the use of this medicine in children. While this drug may be  prescribed for selected conditions, precautions do apply. Overdosage: If you think you have taken too much of this medicine contact a poison control center or emergency room at once. NOTE: This medicine is only for you. Do not share this medicine with others. What if I miss a dose? It is important not to miss your dose. Call your doctor or health care professional if you miss your dose. If you miss a dose due to an On-body Injector failure or leakage, a new dose should be administered as soon as possible using a single prefilled syringe for manual use. What may interact with this medicine? Interactions have not been studied. Give your health care provider a list of all the medicines, herbs, non-prescription drugs, or dietary supplements you use. Also tell them if you smoke, drink alcohol, or use illegal drugs. Some items may interact with your medicine. This list may not describe all possible interactions. Give your health care provider a list of all the medicines, herbs, non-prescription drugs, or dietary supplements you use. Also tell them if you smoke, drink alcohol, or use illegal drugs. Some items may interact with your medicine. What should I watch for while using this medicine? You may need blood work done while you are taking this medicine. If you are going to need a MRI, CT scan, or other procedure, tell your doctor that you are using this medicine (On-Body Injector only). What side effects may I notice from receiving this medicine? Side effects that you should report to your doctor or health care professional as soon as possible:  allergic reactions like skin rash, itching or hives, swelling of the   face, lips, or tongue  back pain  dizziness  fever  pain, redness, or irritation at site where injected  pinpoint red spots on the skin  red or dark-brown urine  shortness of breath or breathing problems  stomach or side pain, or pain at the  shoulder  swelling  tiredness  trouble passing urine or change in the amount of urine Side effects that usually do not require medical attention (report to your doctor or health care professional if they continue or are bothersome):  bone pain  muscle pain This list may not describe all possible side effects. Call your doctor for medical advice about side effects. You may report side effects to FDA at 1-800-FDA-1088. Where should I keep my medicine? Keep out of the reach of children. If you are using this medicine at home, you will be instructed on how to store it. Throw away any unused medicine after the expiration date on the label. NOTE: This sheet is a summary. It may not cover all possible information. If you have questions about this medicine, talk to your doctor, pharmacist, or health care provider.  2020 Elsevier/Gold Standard (2017-11-11 16:57:08)  

## 2020-02-19 ENCOUNTER — Inpatient Hospital Stay: Payer: BC Managed Care – PPO

## 2020-02-19 ENCOUNTER — Other Ambulatory Visit: Payer: Self-pay

## 2020-02-19 ENCOUNTER — Inpatient Hospital Stay: Payer: BC Managed Care – PPO | Attending: Hematology and Oncology

## 2020-02-19 DIAGNOSIS — I951 Orthostatic hypotension: Secondary | ICD-10-CM | POA: Diagnosis not present

## 2020-02-19 DIAGNOSIS — E119 Type 2 diabetes mellitus without complications: Secondary | ICD-10-CM | POA: Insufficient documentation

## 2020-02-19 DIAGNOSIS — E538 Deficiency of other specified B group vitamins: Secondary | ICD-10-CM | POA: Insufficient documentation

## 2020-02-19 DIAGNOSIS — C50511 Malignant neoplasm of lower-outer quadrant of right female breast: Secondary | ICD-10-CM | POA: Diagnosis not present

## 2020-02-19 DIAGNOSIS — R110 Nausea: Secondary | ICD-10-CM | POA: Diagnosis not present

## 2020-02-19 DIAGNOSIS — T451X5A Adverse effect of antineoplastic and immunosuppressive drugs, initial encounter: Secondary | ICD-10-CM | POA: Diagnosis not present

## 2020-02-19 DIAGNOSIS — E785 Hyperlipidemia, unspecified: Secondary | ICD-10-CM | POA: Insufficient documentation

## 2020-02-19 DIAGNOSIS — Z7951 Long term (current) use of inhaled steroids: Secondary | ICD-10-CM | POA: Diagnosis not present

## 2020-02-19 DIAGNOSIS — K219 Gastro-esophageal reflux disease without esophagitis: Secondary | ICD-10-CM | POA: Diagnosis not present

## 2020-02-19 DIAGNOSIS — Z9221 Personal history of antineoplastic chemotherapy: Secondary | ICD-10-CM | POA: Diagnosis not present

## 2020-02-19 DIAGNOSIS — D6481 Anemia due to antineoplastic chemotherapy: Secondary | ICD-10-CM | POA: Diagnosis not present

## 2020-02-19 DIAGNOSIS — Z171 Estrogen receptor negative status [ER-]: Secondary | ICD-10-CM | POA: Diagnosis not present

## 2020-02-19 DIAGNOSIS — Z791 Long term (current) use of non-steroidal anti-inflammatories (NSAID): Secondary | ICD-10-CM | POA: Diagnosis not present

## 2020-02-19 DIAGNOSIS — E876 Hypokalemia: Secondary | ICD-10-CM | POA: Diagnosis not present

## 2020-02-19 DIAGNOSIS — E860 Dehydration: Secondary | ICD-10-CM | POA: Diagnosis not present

## 2020-02-19 DIAGNOSIS — Z7952 Long term (current) use of systemic steroids: Secondary | ICD-10-CM | POA: Diagnosis not present

## 2020-02-19 DIAGNOSIS — D509 Iron deficiency anemia, unspecified: Secondary | ICD-10-CM

## 2020-02-19 DIAGNOSIS — Z79899 Other long term (current) drug therapy: Secondary | ICD-10-CM | POA: Diagnosis not present

## 2020-02-19 DIAGNOSIS — Z9011 Acquired absence of right breast and nipple: Secondary | ICD-10-CM | POA: Insufficient documentation

## 2020-02-19 DIAGNOSIS — R634 Abnormal weight loss: Secondary | ICD-10-CM | POA: Insufficient documentation

## 2020-02-19 DIAGNOSIS — Z801 Family history of malignant neoplasm of trachea, bronchus and lung: Secondary | ICD-10-CM | POA: Diagnosis not present

## 2020-02-19 MED ORDER — HEPARIN SOD (PORK) LOCK FLUSH 100 UNIT/ML IV SOLN
500.0000 [IU] | Freq: Once | INTRAVENOUS | Status: AC | PRN
  Administered 2020-02-19: 500 [IU]
  Filled 2020-02-19: qty 5

## 2020-02-19 MED ORDER — SODIUM CHLORIDE 0.9 % IV SOLN
Freq: Once | INTRAVENOUS | Status: AC
  Filled 2020-02-19: qty 250

## 2020-02-23 ENCOUNTER — Other Ambulatory Visit: Payer: Self-pay

## 2020-02-23 ENCOUNTER — Inpatient Hospital Stay: Payer: BC Managed Care – PPO

## 2020-02-23 VITALS — BP 98/66 | HR 99 | Temp 98.9°F | Resp 18

## 2020-02-23 DIAGNOSIS — C50511 Malignant neoplasm of lower-outer quadrant of right female breast: Secondary | ICD-10-CM | POA: Diagnosis not present

## 2020-02-23 DIAGNOSIS — D509 Iron deficiency anemia, unspecified: Secondary | ICD-10-CM

## 2020-02-23 MED ORDER — HEPARIN SOD (PORK) LOCK FLUSH 100 UNIT/ML IV SOLN
500.0000 [IU] | Freq: Once | INTRAVENOUS | Status: AC | PRN
  Administered 2020-02-23: 500 [IU]
  Filled 2020-02-23: qty 5

## 2020-02-23 MED ORDER — SODIUM CHLORIDE 0.9% FLUSH
10.0000 mL | Freq: Once | INTRAVENOUS | Status: AC | PRN
  Administered 2020-02-23: 10 mL
  Filled 2020-02-23: qty 10

## 2020-02-23 MED ORDER — SODIUM CHLORIDE 0.9 % IV SOLN
Freq: Once | INTRAVENOUS | Status: AC
  Filled 2020-02-23: qty 250

## 2020-02-24 ENCOUNTER — Inpatient Hospital Stay: Payer: BC Managed Care – PPO

## 2020-02-24 DIAGNOSIS — D509 Iron deficiency anemia, unspecified: Secondary | ICD-10-CM

## 2020-02-24 DIAGNOSIS — C50511 Malignant neoplasm of lower-outer quadrant of right female breast: Secondary | ICD-10-CM | POA: Diagnosis not present

## 2020-02-24 MED ORDER — SODIUM CHLORIDE 0.9 % IV SOLN
Freq: Once | INTRAVENOUS | Status: AC
  Filled 2020-02-24: qty 250

## 2020-02-24 MED ORDER — HEPARIN SOD (PORK) LOCK FLUSH 100 UNIT/ML IV SOLN
500.0000 [IU] | Freq: Once | INTRAVENOUS | Status: AC | PRN
  Administered 2020-02-24: 500 [IU]
  Filled 2020-02-24: qty 5

## 2020-02-25 ENCOUNTER — Ambulatory Visit (INDEPENDENT_AMBULATORY_CARE_PROVIDER_SITE_OTHER): Payer: BC Managed Care – PPO | Admitting: Plastic Surgery

## 2020-02-25 ENCOUNTER — Other Ambulatory Visit: Payer: Self-pay

## 2020-02-25 ENCOUNTER — Encounter: Payer: Self-pay | Admitting: Plastic Surgery

## 2020-02-25 VITALS — BP 92/61 | HR 101 | Temp 97.7°F | Ht 66.0 in | Wt 143.0 lb

## 2020-02-25 DIAGNOSIS — C50911 Malignant neoplasm of unspecified site of right female breast: Secondary | ICD-10-CM | POA: Diagnosis not present

## 2020-02-25 DIAGNOSIS — C773 Secondary and unspecified malignant neoplasm of axilla and upper limb lymph nodes: Secondary | ICD-10-CM | POA: Diagnosis not present

## 2020-02-25 NOTE — Progress Notes (Signed)
Referring Provider Center, [place],  [place] [address]   CC:  Chief Complaint  Patient presents with  . Consult      Heather Bell is an 44 y.o. female.  HPI: Patient presents to discuss breast reconstruction.  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  She has undergone neoadjuvant chemotherapy.  She did have a right axillary lymph node that was biopsied that was positive for metastatic carcinoma.  I believe she has had a reasonably good response to neoadjuvant chemotherapy which she has completed.  She was sent by Dr. Windell Moment for consultation regarding reconstructive options.  She is currently considering lumpectomy and radiation versus mastectomy.  She may be able to avoid radiation with a mastectomy.  It still uncertain the degree of surgical excision she will require in the axilla.  Allergies  Allergen Reactions  . Compazine [Prochlorperazine Edisylate] Other (See Comments)    Acute dystonic reaction  . Morphine And Related Shortness Of Breath  . Doxycycline Other (See Comments)    "made all my symptoms worse"   . Fluticasone Other (See Comments)    Doesn't work  . Levofloxacin Other (See Comments)    "made all my symptoms worse"     Outpatient Encounter Medications as of 02/25/2020  Medication Sig  . amphetamine-dextroamphetamine (ADDERALL XR) 25 MG 24 hr capsule Take 25 mg by mouth daily.  . Calcium Carbonate-Vitamin D (CALCIUM 500 + D) 500-125 MG-UNIT TABS Take 1 tablet by mouth daily.  . cetirizine (ZYRTEC) 10 MG tablet Take 10 mg by mouth daily.   Marland Kitchen dexamethasone (DECADRON) 4 MG tablet Take 2 tablets by mouth once a day on the day after chemotherapy then 2 tablets two times a day for 2 days.  Take with food.  . doxepin (SINEQUAN) 25 MG capsule Take 25 mg by mouth at bedtime.   Marland Kitchen ibuprofen (ADVIL) 200 MG tablet Take 400-800 mg by mouth every 6 (six) hours as needed for  headache or moderate pain.  Marland Kitchen lamoTRIgine (LAMICTAL) 200 MG tablet Take 200 mg by mouth at bedtime.   . lidocaine-prilocaine (EMLA) cream Apply topically over port-a-cath 30 minutes to 1 hour prior to treatment.  Marland Kitchen loratadine (CLARITIN) 10 MG tablet Take 10 mg by mouth daily.  Marland Kitchen lurasidone (LATUDA) 40 MG TABS tablet Take 40 mg by mouth at bedtime. Take with 60 mg to equal 100 mg at night  . Lurasidone HCl 60 MG TABS Take 60 mg by mouth at bedtime. Take with 40 mg to equal 100 mg at night  . metFORMIN (GLUCOPHAGE-XR) 500 MG 24 hr tablet Take 1,000 mg by mouth 2 (two) times daily.  . mometasone (NASONEX) 50 MCG/ACT nasal spray Place 2 sprays into the nose daily as needed (allergies).   . Multiple Vitamin (MULTIVITAMIN WITH MINERALS) TABS tablet Take 1 tablet by mouth daily.  Marland Kitchen omeprazole (PRILOSEC) 20 MG capsule Take 20 mg by mouth daily.   . ondansetron (ZOFRAN) 8 MG tablet Take 1 tablet (8 mg total) by mouth 2 (two) times daily as needed for refractory nausea / vomiting. Start on day 3 after carboplatin chemo.  . potassium chloride (KLOR-CON) 10 MEQ tablet Take 1 tablet (10 mEq total) by mouth as directed. Take 1 tablet daily as directed by MD.  . QUEtiapine (SEROQUEL XR) 300 MG 24 hr tablet Take 300 mg by mouth at bedtime.  . simvastatin (ZOCOR) 20 MG tablet Take  20 mg by mouth daily.  Marland Kitchen topiramate (TOPAMAX) 100 MG tablet Take 50-100 mg by mouth See admin instructions. Take 100 mg in the morning and 50 mg at night  . vitamin B-12 (CYANOCOBALAMIN) 1000 MCG tablet Take 1,000 mcg by mouth daily.   Facility-Administered Encounter Medications as of 02/25/2020  Medication  . 0.9 %  sodium chloride infusion  . sodium chloride flush (NS) 0.9 % injection 10 mL     Past Medical History:  Diagnosis Date  . Allergic rhinitis   . Anxiety   . Bipolar disorder ([place])    depression  . Cancer [facility])    breast right  . Depression   . Diabetes ([place])   . GERD (gastroesophageal reflux disease)   .  Hyperlipidemia     Past Surgical History:  Procedure Laterality Date  . BREAST BIOPSY Right 07/22/2019   mass bx, path pending, heart marker  . BREAST BIOPSY Right 07/22/2019   LN bx, path pending,  butterfly hydromarker  . IMAGE GUIDED SINUS SURGERY    . KNEE SURGERY    . NASAL SEPTUM SURGERY    . PORTACATH PLACEMENT N/A 08/07/2019   Procedure: INSERTION PORT-A-CATH;  Surgeon: Herbert Pun, MD;  Location: [facility] ORS;  Service: General;  Laterality: N/A;  . TONSILLECTOMY      Family History  Problem Relation Age of Onset  . Lung cancer Father   . Heart attack Father   . Lung cancer Paternal Uncle   . Heart disease Paternal Uncle   . Lung cancer Paternal Aunt     Social History   Social History Narrative  . Not on file  Denies tobacco use  Review of Systems General: Denies fevers, chills, weight loss CV: Denies chest pain, shortness of breath, palpitations  Physical Exam Vitals with BMI 02/25/2020 02/23/2020 02/23/2020  Height 5\' 6"  - -  Weight 143 lbs - -  BMI 74.25 - -  Systolic 92 98 92  Diastolic 61 66 64  Pulse 956 99 118    General:  No acute distress,  Alert and oriented, Non-Toxic, Normal speech and affect Breast: She has a little bit of pseudoptosis.  She is probably around a B cup in size.  She has a port in the left upper chest.  It is difficult to feel her mass in the right lower lateral quadrant that is fairly close to the areola.  She has pretty good skin quality.  Base width is about 11.5 cm.  Assessment/Plan Patient presents to discuss breast reconstruction.  We went over all the various options today.  If she were to undergo mastectomy the options would be implant-based or autologous tissue-based reconstructions.  I briefly went over the latissimus flap and abdominally based tissue flaps and she did not seem very interested in those.  Regarding implant-based reconstruction we discussed a staged reconstruction involving a placement of a tissue expander  at the time of mastectomy followed by switching out to a silicone implant and a second stage.  I believe it would be difficult to get the left side to match the right side without placement of at least a small implant on that side due to the lack of upper pole fullness bilaterally.  I explained if she would need radiation after surgery I would try to expand her and switch her out within 6 weeks to try to get the permanent implant in place quickly prior to her being radiated.  We discussed the risk of the procedure that include bleeding,  infection, damage to surrounding structures, need for additional procedures.  We discussed the potential for reconstructive failure.  We discussed the potential for wound healing complications.  We discussed the need for drains postoperatively and how that would be managed.  I also discussed that I would likely use an acellular dermal matrix to cover the expander to give it an extra soft tissue support.  She is going to think about her cancer operation and let us know how she would like to proceed.  [name] 02/25/2020, 2:07 PM

## 2020-02-26 ENCOUNTER — Inpatient Hospital Stay: Payer: BC Managed Care – PPO

## 2020-02-26 DIAGNOSIS — C50511 Malignant neoplasm of lower-outer quadrant of right female breast: Secondary | ICD-10-CM | POA: Diagnosis not present

## 2020-02-26 DIAGNOSIS — D509 Iron deficiency anemia, unspecified: Secondary | ICD-10-CM

## 2020-02-26 MED ORDER — HEPARIN SOD (PORK) LOCK FLUSH 100 UNIT/ML IV SOLN
500.0000 [IU] | Freq: Once | INTRAVENOUS | Status: AC | PRN
  Administered 2020-02-26: 500 [IU]
  Filled 2020-02-26: qty 5

## 2020-02-26 MED ORDER — SODIUM CHLORIDE 0.9 % IV SOLN
Freq: Once | INTRAVENOUS | Status: AC
  Filled 2020-02-26: qty 250

## 2020-02-29 ENCOUNTER — Other Ambulatory Visit: Payer: Self-pay | Admitting: Hematology and Oncology

## 2020-02-29 NOTE — Telephone Encounter (Signed)
°   Ref Range & Units 13 d ago 1 mo ago  Potassium 3.5 - 5.1 mmol/L 3.8  4.1

## 2020-03-01 ENCOUNTER — Inpatient Hospital Stay: Payer: BC Managed Care – PPO

## 2020-03-01 ENCOUNTER — Other Ambulatory Visit: Payer: Self-pay

## 2020-03-01 DIAGNOSIS — D509 Iron deficiency anemia, unspecified: Secondary | ICD-10-CM

## 2020-03-01 DIAGNOSIS — C50511 Malignant neoplasm of lower-outer quadrant of right female breast: Secondary | ICD-10-CM | POA: Diagnosis not present

## 2020-03-01 MED ORDER — HEPARIN SOD (PORK) LOCK FLUSH 100 UNIT/ML IV SOLN
500.0000 [IU] | Freq: Once | INTRAVENOUS | Status: AC
  Administered 2020-03-01: 500 [IU] via INTRAVENOUS
  Filled 2020-03-01: qty 5

## 2020-03-01 MED ORDER — SODIUM CHLORIDE 0.9 % IV SOLN
Freq: Once | INTRAVENOUS | Status: AC
  Filled 2020-03-01: qty 250

## 2020-03-02 ENCOUNTER — Inpatient Hospital Stay: Payer: BC Managed Care – PPO

## 2020-03-02 VITALS — BP 92/61 | HR 116 | Temp 98.9°F | Resp 18

## 2020-03-02 DIAGNOSIS — C50511 Malignant neoplasm of lower-outer quadrant of right female breast: Secondary | ICD-10-CM | POA: Diagnosis not present

## 2020-03-02 DIAGNOSIS — D509 Iron deficiency anemia, unspecified: Secondary | ICD-10-CM

## 2020-03-02 MED ORDER — HEPARIN SOD (PORK) LOCK FLUSH 100 UNIT/ML IV SOLN
500.0000 [IU] | Freq: Once | INTRAVENOUS | Status: AC | PRN
  Administered 2020-03-02: 500 [IU]
  Filled 2020-03-02: qty 5

## 2020-03-02 MED ORDER — SODIUM CHLORIDE 0.9% FLUSH
10.0000 mL | Freq: Once | INTRAVENOUS | Status: AC | PRN
  Administered 2020-03-02: 10 mL
  Filled 2020-03-02: qty 10

## 2020-03-02 MED ORDER — SODIUM CHLORIDE 0.9 % IV SOLN
Freq: Once | INTRAVENOUS | Status: AC
  Filled 2020-03-02: qty 250

## 2020-03-03 ENCOUNTER — Other Ambulatory Visit: Payer: Self-pay | Admitting: General Surgery

## 2020-03-03 DIAGNOSIS — C50011 Malignant neoplasm of nipple and areola, right female breast: Secondary | ICD-10-CM

## 2020-03-03 DIAGNOSIS — C50911 Malignant neoplasm of unspecified site of right female breast: Secondary | ICD-10-CM

## 2020-03-04 ENCOUNTER — Inpatient Hospital Stay: Payer: BC Managed Care – PPO

## 2020-03-04 ENCOUNTER — Other Ambulatory Visit: Payer: Self-pay

## 2020-03-04 VITALS — BP 106/71 | HR 84 | Temp 98.8°F | Resp 16

## 2020-03-04 DIAGNOSIS — D509 Iron deficiency anemia, unspecified: Secondary | ICD-10-CM

## 2020-03-04 DIAGNOSIS — C50511 Malignant neoplasm of lower-outer quadrant of right female breast: Secondary | ICD-10-CM | POA: Diagnosis not present

## 2020-03-04 MED ORDER — SODIUM CHLORIDE 0.9% FLUSH
10.0000 mL | Freq: Once | INTRAVENOUS | Status: DC | PRN
  Filled 2020-03-04: qty 10

## 2020-03-04 MED ORDER — SODIUM CHLORIDE 0.9 % IV SOLN
Freq: Once | INTRAVENOUS | Status: AC
  Filled 2020-03-04: qty 250

## 2020-03-04 MED ORDER — HEPARIN SOD (PORK) LOCK FLUSH 100 UNIT/ML IV SOLN
INTRAVENOUS | Status: AC
  Filled 2020-03-04: qty 5

## 2020-03-04 MED ORDER — HEPARIN SOD (PORK) LOCK FLUSH 100 UNIT/ML IV SOLN
500.0000 [IU] | Freq: Once | INTRAVENOUS | Status: AC | PRN
  Administered 2020-03-04: 500 [IU]
  Filled 2020-03-04: qty 5

## 2020-03-07 ENCOUNTER — Other Ambulatory Visit: Payer: Self-pay | Admitting: General Surgery

## 2020-03-07 ENCOUNTER — Encounter: Payer: Self-pay | Admitting: Hematology and Oncology

## 2020-03-07 ENCOUNTER — Ambulatory Visit
Admission: RE | Admit: 2020-03-07 | Discharge: 2020-03-07 | Disposition: A | Discharge status: Home / Self Care | Payer: BC Managed Care – PPO | Source: Ambulatory Visit | Attending: General Surgery | Admitting: General Surgery

## 2020-03-07 ENCOUNTER — Other Ambulatory Visit: Payer: Self-pay

## 2020-03-07 DIAGNOSIS — C773 Secondary and unspecified malignant neoplasm of axilla and upper limb lymph nodes: Secondary | ICD-10-CM

## 2020-03-07 DIAGNOSIS — C50911 Malignant neoplasm of unspecified site of right female breast: Secondary | ICD-10-CM | POA: Insufficient documentation

## 2020-03-07 NOTE — Progress Notes (Signed)
No new changes noted today. The patient name and DOB has been verified by phone today. 

## 2020-03-07 NOTE — Progress Notes (Signed)
[facility]  [address], [address] [place], [place] [address] Phone: [phone]  Fax: [phone]   Clinic Day:  03/08/2020  Referring physician: Center, [place]  Chief Complaint: Heather Bell is a 44 y.o. female with clinical stage IIBtriple negativerightbreast cancer who is seen for assessment prior to mastectomy.  HPI: The patient was last seen in the medical oncology clinic on 02/16/2020. At that time, she was doing well.  She denied any concerns.  Exam was stable.  She received cycle #4 AC with Fulphila support.  She met with Dr. Windell Moment at [facility] to discuss breast surgery on 02/18/2020. She also met with Dr. Mingo Amber in Plastic Surgery to discuss reconstructive options. She is scheduled for a right total mastectomy on 03/21/2020 with Dr. Windell Moment.   During the interim, she feels ok overall. She states she had some mild nausea this morning, which worsened with food. She has no other complaints. Next week she has plans to go to [place], [place]. She is excited to spend some time on vacation prior to her surgery.   She does have appropriate questions about her follow up after her surgery, medications, and birth control.   A list of questions were asked and answered today.   Past Medical History:  Diagnosis Date  . Allergic rhinitis   . Anxiety   . Bipolar disorder ([place])    depression  . Cancer [facility])    breast right  . Depression   . Diabetes ([place])   . GERD (gastroesophageal reflux disease)   . Hyperlipidemia     Past Surgical History:  Procedure Laterality Date  . BREAST BIOPSY Right 07/22/2019   mass bx, path pending, heart marker  . BREAST BIOPSY Right 07/22/2019   LN bx, path pending,  butterfly hydromarker  . IMAGE GUIDED SINUS SURGERY    . KNEE SURGERY    . NASAL SEPTUM SURGERY    . PORTACATH PLACEMENT N/A 08/07/2019   Procedure: INSERTION PORT-A-CATH;  Surgeon: Herbert Pun, MD;  Location: [facility] ORS;   Service: General;  Laterality: N/A;  . TONSILLECTOMY      Family History  Problem Relation Age of Onset  . Lung cancer Father   . Heart attack Father   . Lung cancer Paternal Uncle   . Heart disease Paternal Uncle   . Lung cancer Paternal Aunt     Social History:  reports that she has never smoked. She has never used smokeless tobacco. She reports that she does not drink alcohol and does not use drugs. Deniesany exposure to radiation or toxins.She has 2 children (73 year old daughter and 36 year old son).She use to work inthefast foodindustry. She is currently not working. Her husbandisMichael.Michaelused totravel a lot for his job.Her husband found a new job closer to home in [place], [place]. The patient is alone today.   Allergies:  Allergies  Allergen Reactions  . Compazine [Prochlorperazine Edisylate] Other (See Comments)    Acute dystonic reaction  . Morphine And Related Shortness Of Breath  . Doxycycline Other (See Comments)    "made all my symptoms worse"   . Fluticasone Other (See Comments)    Doesn't work  . Levofloxacin Other (See Comments)    "made all my symptoms worse"     Current Medications: Current Outpatient Medications  Medication Sig Dispense Refill  . amphetamine-dextroamphetamine (ADDERALL XR) 25 MG 24 hr capsule Take 25 mg by mouth daily.    . Calcium Carb-Cholecalciferol (CALCIUM 600+D) 600-800 MG-UNIT  TABS Take 1 tablet by mouth daily.    . cetirizine (ZYRTEC) 10 MG tablet Take 10 mg by mouth daily.     Marland Kitchen doxepin (SINEQUAN) 25 MG capsule Take 25-50 mg by mouth at bedtime.     Marland Kitchen ibuprofen (ADVIL) 200 MG tablet Take 400-800 mg by mouth every 6 (six) hours as needed for headache or moderate pain.    Marland Kitchen lamoTRIgine (LAMICTAL) 200 MG tablet Take 200 mg by mouth at bedtime.     . lidocaine-prilocaine (EMLA) cream Apply topically over port-a-cath 30 minutes to 1 hour prior to treatment. 30 g 1  . loratadine (CLARITIN) 10 MG tablet Take 10 mg by mouth  daily.    Marland Kitchen LORazepam (ATIVAN) 0.5 MG tablet Take 0.5 mg by mouth every 8 (eight) hours as needed (nausea).    . lurasidone (LATUDA) 40 MG TABS tablet Take 40 mg by mouth at bedtime.     . Lurasidone HCl 60 MG TABS Take 60 mg by mouth at bedtime.     . metFORMIN (GLUCOPHAGE-XR) 500 MG 24 hr tablet Take 1,000 mg by mouth 2 (two) times daily.    . mometasone (NASONEX) 50 MCG/ACT nasal spray Place 2 sprays into the nose daily as needed (allergies).     . Multiple Vitamin (MULTIVITAMIN WITH MINERALS) TABS tablet Take 1 tablet by mouth daily.    Marland Kitchen omeprazole (PRILOSEC) 20 MG capsule Take 20 mg by mouth daily before breakfast.     . ondansetron (ZOFRAN) 8 MG tablet Take 1 tablet (8 mg total) by mouth 2 (two) times daily as needed for refractory nausea / vomiting. Start on day 3 after carboplatin chemo. 30 tablet 1  . potassium chloride (KLOR-CON) 10 MEQ tablet Take 1 tablet (10 mEq total) by mouth daily. 20 tablet 0  . QUEtiapine (SEROQUEL) 300 MG tablet Take 300 mg by mouth at bedtime.    . simvastatin (ZOCOR) 20 MG tablet Take 20 mg by mouth daily.    Marland Kitchen topiramate (TOPAMAX) 100 MG tablet Take 50-100 mg by mouth See admin instructions. Take 1 tablet (100 mg) by mouth in the morning & take 0.5 tablet (50 mg) by mouth at night.    . vitamin B-12 (CYANOCOBALAMIN) 1000 MCG tablet Take 1,000 mcg by mouth daily.    Marland Kitchen dexamethasone (DECADRON) 4 MG tablet Take 2 tablets by mouth once a day on the day after chemotherapy then 2 tablets two times a day for 2 days.  Take with food. (Patient not taking: Reported on 03/04/2020) 20 tablet 1   No current facility-administered medications for this visit.   Facility-Administered Medications Ordered in Other Visits  Medication Dose Route Frequency Provider Last Rate Last Admin  . 0.9 %  sodium chloride infusion   Intravenous Continuous Lequita Asal, MD   Stopped at 01/08/20 1516  . sodium chloride flush (NS) 0.9 % injection 10 mL  10 mL Intravenous PRN Nolon Stalls C, MD   10 mL at 01/08/20 1314    Review of Systems  Constitutional: Positive for weight loss (down 1 lb). Negative for chills, diaphoresis, fever and malaise/fatigue.       Doing "ok."  HENT: Negative for congestion, ear discharge, ear pain, hearing loss, nosebleeds, sinus pain, sore throat and tinnitus.   Eyes: Negative.  Negative for blurred vision, double vision and photophobia.  Respiratory: Negative for cough, hemoptysis, sputum production and shortness of breath.   Cardiovascular: Negative.  Negative for chest pain, palpitations, orthopnea and leg swelling.  Gastrointestinal: Positive for nausea (occasional, some today). Negative for abdominal pain, blood in stool (hemorrhoids), constipation, diarrhea, heartburn (on antacids), melena and vomiting.       Eating well. Struggles to drink enough fluids.  Genitourinary: Negative for dysuria, frequency, hematuria and urgency.       Premenopausal.   Musculoskeletal: Negative for back pain, joint pain, myalgias and neck pain.  Skin: Negative for itching and rash.       RIGHT breast lump barely palpable.  Neurological: Negative for dizziness, tingling, sensory change, weakness and headaches.  Endo/Heme/Allergies: Does not bruise/bleed easily.       Type II diabetes.   Psychiatric/Behavioral: Negative for depression and memory loss. The patient is not nervous/anxious and does not have insomnia.   All other systems reviewed and are negative.  Performance status (ECOG):  1  Vitals Blood pressure 96/62, pulse 97, temperature 97.7 F (36.5 C), temperature source Tympanic, resp. rate 18, height _0  (1.676 m), weight 142 lb 12 oz (64.8 kg), SpO2 98 %.   Physical Exam Vitals and nursing note reviewed.  Constitutional:      General: She is not in acute distress.    Appearance: She is well-developed. She is not diaphoretic.  HENT:     Head: Normocephalic and atraumatic.     Comments: Black wrap. Eyes:     General: No scleral  icterus.    Conjunctiva/sclera: Conjunctivae normal.     Comments: Brown eyes.  Musculoskeletal:        General: No swelling or tenderness.  Neurological:     Mental Status: She is alert and oriented to person, place, and time.  Psychiatric:        Mood and Affect: Mood normal.        Behavior: Behavior normal.        Thought Content: Thought content normal.        Judgment: Judgment normal.    Infusion on 03/08/2020  Component Date Value Ref Range Status  . Magnesium 03/08/2020 1.8  1.7 - 2.4 mg/dL Final   Performed at [facility], [address], [place], [place] [address]  . WBC 03/08/2020 7.2  4.0 - 10.5 K/uL Final  . RBC 03/08/2020 3.29* 3.87 - 5.11 MIL/uL Final  . Hemoglobin 03/08/2020 10.4* 12.0 - 15.0 g/dL Final  . HCT 03/08/2020 32.0* 36 - 46 % Final  . MCV 03/08/2020 97.3  80.0 - 100.0 fL Final  . MCH 03/08/2020 31.6  26.0 - 34.0 pg Final  . MCHC 03/08/2020 32.5  30.0 - 36.0 g/dL Final  . RDW 03/08/2020 13.8  11.5 - 15.5 % Final  . Platelets 03/08/2020 305  150 - 400 K/uL Final  . nRBC 03/08/2020 0.0  0.0 - 0.2 % Final  . Neutrophils Relative % 03/08/2020 66  % Final  . Neutro Abs 03/08/2020 4.7  1.7 - 7.7 K/uL Final  . Lymphocytes Relative 03/08/2020 21  % Final  . Lymphs Abs 03/08/2020 1.5  0.7 - 4.0 K/uL Final  . Monocytes Relative 03/08/2020 9  % Final  . Monocytes Absolute 03/08/2020 0.7  0 - 1 K/uL Final  . Eosinophils Relative 03/08/2020 2  % Final  . Eosinophils Absolute 03/08/2020 0.2  0 - 0 K/uL Final  . Basophils Relative 03/08/2020 1  % Final  . Basophils Absolute 03/08/2020 0.1  0 - 0 K/uL Final  . Immature Granulocytes 03/08/2020 1  % Final  . Abs Immature Granulocytes 03/08/2020 0.04  0.00 - 0.07  K/uL Final   Performed at [facility], [address], [place], [place] [address]  . Sodium 03/08/2020 138  135 - 145 mmol/L Final  . Potassium 03/08/2020 3.7  3.5 - 5.1 mmol/L Final  . Chloride 03/08/2020 105  98 - 111 mmol/L  Final  . CO2 03/08/2020 22  22 - 32 mmol/L Final  . Glucose, Bld 03/08/2020 100* 70 - 99 mg/dL Final   Glucose reference range applies only to samples taken after fasting for at least 8 hours.  . BUN 03/08/2020 15  6 - 20 mg/dL Final  . Creatinine, Ser 03/08/2020 0.74  0.44 - 1.00 mg/dL Final  . Calcium 03/08/2020 8.8* 8.9 - 10.3 mg/dL Final  . Total Protein 03/08/2020 6.9  6.5 - 8.1 g/dL Final  . Albumin 03/08/2020 4.1  3.5 - 5.0 g/dL Final  . AST 03/08/2020 19  15 - 41 U/L Final  . ALT 03/08/2020 17  0 - 44 U/L Final  . Alkaline Phosphatase 03/08/2020 81  38 - 126 U/L Final  . Total Bilirubin 03/08/2020 0.2* 0.3 - 1.2 mg/dL Final  . GFR calc non Af Amer 03/08/2020 >60  >60 mL/min Final  . GFR calc Af Amer 03/08/2020 >60  >60 mL/min Final  . Anion gap 03/08/2020 11  5 - 15 Final   Performed at [facility], [address], [place], [place] [address]    Assessment:  Heather Bell is a 44 y.o. female with clinical T1cN1triple negativerightbreast cancers/p ultrasound guided core biopsy. Pathology revealeda grade IIIinvasive mammary carcinoma, no special type of the right breast. Specimen was 10 mm.Ductal carcinoma in situ(DCIS)was not identified.Right axillarylymph nodewas positive for metastatic carcinoma.Tumor was ER - (<1%), PR - (<1%), and Her2/neu -.  Bilateral mammogram and right unilateral ultrasoundon 07/10/2019 revealed a 1.7 x 1.6 x 1.6 cmmass at the7 o'clock region of the right breast 7 cm from the nipple. There was an abnormal1.4 x 0.7 x 0.9 cmaxillary lymph node. Therewas a second3 mmaxillary lymph node with minimal focal cortical thickening.An ultrasound-guided core biopsies of the mass and the enlarged right axillary lymph node was recommended. BI-RADS category 5.  PET scanon 08/11/2019 showed a hypermetabolic mass in the inferior RIGHT breast consistentwith herbreast carcinoma. There was noevidence hypermetabolic nodal  metastasis.There was no distant metastatic disease.There was nopulmonary metastasis or skeletal metastasis.  [place] 12/24/2020revealedan ejection fraction of50 to 55%. The left ventricle demonstrated global hypokinesis.MUGAon 08/24/2019 revealed an EF of 60%.MUGAon 12/08/2019 revealed an EF of57.4%.  MUGA on 01/22/2020 revealed an EF of 56.5%.  She is followed by Dr End.  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  Shereceived 12 weeksof Taxol + carboplatin(08/25/2019 -11/24/2019).Carboplatin was held on week #7 and 8 secondary to marginal counts. She received Granix on 10/28/2019 and 11/20/2019.  She received 4 cycles of AC (12/15/2019 - 02/16/2020) with Fulphila ([id number]) support.  She has required IVF support.  Breast masswas larger after initiation of treatment felt secondary to post biopsy hematoma. Right breast ultrasoundon 09/07/2019 revealed the mass in the 7 o'clock position contained cystic components and measured 3 cm (compared to 1.7 cm on 07/10/2019). The biopsied lymph node with 3 mm cortical thickening in the axilla was similar. Right breast ultrasoundon 10/09/2019 revealed interval decrease in size of RIGHT breast mass (1.4 x 1.7 x 1.4 cm). Mass was less well defined and associated with parenchymal edema.Right breast ultrasoundon 12/03/2019 revealed a 1.3 x  1.5 x 1.2 cm irregular hypoechoic mass right breast 7 o'clock position 7 cm from nipple.  She is premenopausal. She stopped taking birth control pills on 08/17/2019.  She has iron deficiency. Ferritin was 21 with an iron saturation of 7% and a TIBC 343 on 09/08/2019.She received Venoferweekly x 3 (09/18/2019 - 10/06/2019).  She has B12 deficiency. B12 was 240 on 01/19/2021and 680 on 11/24/2019.She is on oral B12.  Folate was 62.7 on 09/08/2019.  She has recurrent  orthostatic hypotension. Fluid intake is marginal. AM cortisol was 5.3 (low) on 09/22/2019 suggestive of adrenal insufficiency.ACTH stim testwas negative.  Cortisol was 9.0 on 12/24/2019.   Symptomatically, she is doing well.  Fluid intake remains poor.  She would like to continue IV fluids.  Plan: 1.    Labs today: CBC with diff, CMP, Mg 2.Clinical stageIIB triple negativeright breast cancer  Patient is s/p neoadjuvant chemotherapy. She completed Taxol + carboplatin x 12 (last 12/15/2019). She completed 4 cycles of AC (last 02/16/2020) with Filphila suppot. Counts have recovered.  Discuss plans for mastectomy on 03/21/2020.   Notes reviewed by Dr. Windell Moment.  3. Chemotherapy induced anemia Hematocrit 32.0, hemoglobin10.4, MCV97.3 today. Ferritin was 89 on 11/24/2019. No intervention needed. 4. B12 deficiency Patient remains on oral B12.  B12 was 240 on 01/19/2021and 680 on 11/24/2019. Folate was 62.7 on 09/08/2019.  Continue to monitor annually. 5.Chronic dehydration Cortisol level was 9.0 (normal) on 12/24/2019.  Patient notes desire to continue IV fluids.   Encourage oral fluids. 6.   Hypokalemia  Potassium 3.7.  Etiology secondary to carboplatin.  Continue potassium with plans to taper to off.  Rx:  potassium 10 meq po q day. 7.   IVF today and tomorrow. 8.   RTC on 03/11/2020 in [place] for IVF. 9.   RTC on 04/04/2020 for MD assessment, +/- labs (BMP), and review of pathology s/p mastectomy.  I discussed the assessment and treatment plan with the patient.  The patient was provided an opportunity to ask questions and all were answered.  The patient agreed with the plan and demonstrated an understanding of the instructions.  The patient was advised to call back if the symptoms worsen or if the condition fails to improve  as anticipated.    I provided 15 minutes (10:36 AM - 10:51 AM) of face-to-face time during this this encounter and > 50% was spent counseling as documented under my assessment and plan.  An additional 5+ minutes were spent reviewing her chart ([place]) including notes, labs, and imaging studies.   Lequita Asal, MD, PhD    03/08/2020, 10:51 AM  I, Jacqualyn Posey, am acting as a Education administrator for [employer]. Mike Gip, MD.   I, Melissa C. Mike Gip, MD, have reviewed the above documentation for accuracy and completeness, and I agree with the above.

## 2020-03-08 ENCOUNTER — Inpatient Hospital Stay (HOSPITAL_BASED_OUTPATIENT_CLINIC_OR_DEPARTMENT_OTHER): Payer: BC Managed Care – PPO | Admitting: Hematology and Oncology

## 2020-03-08 ENCOUNTER — Inpatient Hospital Stay: Payer: BC Managed Care – PPO

## 2020-03-08 ENCOUNTER — Encounter: Payer: Self-pay | Admitting: Hematology and Oncology

## 2020-03-08 VITALS — BP 96/62 | HR 97 | Temp 97.7°F | Resp 18 | Ht 66.0 in | Wt 142.7 lb

## 2020-03-08 DIAGNOSIS — E538 Deficiency of other specified B group vitamins: Secondary | ICD-10-CM

## 2020-03-08 DIAGNOSIS — T451X5A Adverse effect of antineoplastic and immunosuppressive drugs, initial encounter: Secondary | ICD-10-CM

## 2020-03-08 DIAGNOSIS — D6481 Anemia due to antineoplastic chemotherapy: Secondary | ICD-10-CM

## 2020-03-08 DIAGNOSIS — C50919 Malignant neoplasm of unspecified site of unspecified female breast: Secondary | ICD-10-CM

## 2020-03-08 DIAGNOSIS — C50511 Malignant neoplasm of lower-outer quadrant of right female breast: Secondary | ICD-10-CM

## 2020-03-08 DIAGNOSIS — Z171 Estrogen receptor negative status [ER-]: Secondary | ICD-10-CM

## 2020-03-08 DIAGNOSIS — E860 Dehydration: Secondary | ICD-10-CM | POA: Diagnosis not present

## 2020-03-08 DIAGNOSIS — Z7189 Other specified counseling: Secondary | ICD-10-CM

## 2020-03-08 DIAGNOSIS — E876 Hypokalemia: Secondary | ICD-10-CM

## 2020-03-08 DIAGNOSIS — D509 Iron deficiency anemia, unspecified: Secondary | ICD-10-CM

## 2020-03-08 LAB — CBC WITH DIFFERENTIAL/PLATELET
Abs Immature Granulocytes: 0.04 10*3/uL (ref 0.00–0.07)
Basophils Absolute: 0.1 10*3/uL (ref 0.0–0.1)
Basophils Relative: 1 %
Eosinophils Absolute: 0.2 10*3/uL (ref 0.0–0.5)
Eosinophils Relative: 2 %
HCT: 32 % — ABNORMAL LOW (ref 36.0–46.0)
Hemoglobin: 10.4 g/dL — ABNORMAL LOW (ref 12.0–15.0)
Immature Granulocytes: 1 %
Lymphocytes Relative: 21 %
Lymphs Abs: 1.5 10*3/uL (ref 0.7–4.0)
MCH: 31.6 pg (ref 26.0–34.0)
MCHC: 32.5 g/dL (ref 30.0–36.0)
MCV: 97.3 fL (ref 80.0–100.0)
Monocytes Absolute: 0.7 10*3/uL (ref 0.1–1.0)
Monocytes Relative: 9 %
Neutro Abs: 4.7 10*3/uL (ref 1.7–7.7)
Neutrophils Relative %: 66 %
Platelets: 305 10*3/uL (ref 150–400)
RBC: 3.29 MIL/uL — ABNORMAL LOW (ref 3.87–5.11)
RDW: 13.8 % (ref 11.5–15.5)
WBC: 7.2 10*3/uL (ref 4.0–10.5)
nRBC: 0 % (ref 0.0–0.2)

## 2020-03-08 LAB — COMPREHENSIVE METABOLIC PANEL
ALT: 17 U/L (ref 0–44)
AST: 19 U/L (ref 15–41)
Albumin: 4.1 g/dL (ref 3.5–5.0)
Alkaline Phosphatase: 81 U/L (ref 38–126)
Anion gap: 11 (ref 5–15)
BUN: 15 mg/dL (ref 6–20)
CO2: 22 mmol/L (ref 22–32)
Calcium: 8.8 mg/dL — ABNORMAL LOW (ref 8.9–10.3)
Chloride: 105 mmol/L (ref 98–111)
Creatinine, Ser: 0.74 mg/dL (ref 0.44–1.00)
GFR calc Af Amer: >60 mL/min (ref >60)
GFR calc non Af Amer: >60 mL/min (ref >60)
Glucose, Bld: 100 mg/dL — ABNORMAL HIGH (ref 70–99)
Potassium: 3.7 mmol/L (ref 3.5–5.1)
Sodium: 138 mmol/L (ref 135–145)
Total Bilirubin: 0.2 mg/dL — ABNORMAL LOW (ref 0.3–1.2)
Total Protein: 6.9 g/dL (ref 6.5–8.1)

## 2020-03-08 LAB — MAGNESIUM: Magnesium: 1.8 mg/dL (ref 1.7–2.4)

## 2020-03-08 MED ORDER — SODIUM CHLORIDE 0.9% FLUSH
10.0000 mL | Freq: Once | INTRAVENOUS | Status: AC | PRN
  Administered 2020-03-08: 10 mL
  Filled 2020-03-08: qty 10

## 2020-03-08 MED ORDER — POTASSIUM CHLORIDE ER 10 MEQ PO TBCR: 10.0000 meq | EXTENDED_RELEASE_TABLET | Freq: Every day | ORAL | 0 refills | Status: DC

## 2020-03-08 MED ORDER — HEPARIN SOD (PORK) LOCK FLUSH 100 UNIT/ML IV SOLN
500.0000 [IU] | Freq: Once | INTRAVENOUS | Status: AC | PRN
  Administered 2020-03-08: 500 [IU]
  Filled 2020-03-08: qty 5

## 2020-03-08 MED ORDER — SODIUM CHLORIDE 0.9 % IV SOLN
Freq: Once | INTRAVENOUS | Status: AC
  Filled 2020-03-08: qty 250

## 2020-03-08 MED ORDER — LIDOCAINE-PRILOCAINE 2.5-2.5 % EX CREA: TOPICAL_CREAM | CUTANEOUS | 1 refills | Status: DC

## 2020-03-09 ENCOUNTER — Inpatient Hospital Stay: Payer: BC Managed Care – PPO

## 2020-03-09 ENCOUNTER — Encounter: Payer: BC Managed Care – PPO | Admitting: Surgical

## 2020-03-09 ENCOUNTER — Other Ambulatory Visit: Payer: Self-pay

## 2020-03-09 VITALS — BP 109/65 | HR 94 | Temp 98.7°F | Resp 18

## 2020-03-09 DIAGNOSIS — C50511 Malignant neoplasm of lower-outer quadrant of right female breast: Secondary | ICD-10-CM | POA: Diagnosis not present

## 2020-03-09 DIAGNOSIS — D509 Iron deficiency anemia, unspecified: Secondary | ICD-10-CM

## 2020-03-09 MED ORDER — SODIUM CHLORIDE 0.9% FLUSH
10.0000 mL | Freq: Once | INTRAVENOUS | Status: AC | PRN
  Administered 2020-03-09: 10 mL
  Filled 2020-03-09: qty 10

## 2020-03-09 MED ORDER — SODIUM CHLORIDE 0.9 % IV SOLN
Freq: Once | INTRAVENOUS | Status: AC
  Administered 2020-03-09: 1000 mL via INTRAVENOUS
  Filled 2020-03-09: qty 250

## 2020-03-09 MED ORDER — HEPARIN SOD (PORK) LOCK FLUSH 100 UNIT/ML IV SOLN
INTRAVENOUS | Status: AC
  Filled 2020-03-09: qty 5

## 2020-03-09 MED ORDER — HEPARIN SOD (PORK) LOCK FLUSH 100 UNIT/ML IV SOLN
500.0000 [IU] | Freq: Once | INTRAVENOUS | Status: AC | PRN
  Administered 2020-03-09: 500 [IU]
  Filled 2020-03-09: qty 5

## 2020-03-09 NOTE — Progress Notes (Signed)
Patient ID: Heather Bell, female    DOB: Apr 27, 1976, 44 y.o.   MRN: [id number]  Chief Complaint  Patient presents with  . Pre-op Exam      ICD-10-CM   1. Breast cancer metastasized to axillary lymph node, right (HCC)  C50.911    C77.3     History of Present Illness: Heather Bell is a 44 y.o.  female  with a history of triple negative right breast cancer > 6 months ago, underwent neoadjuvant chemotherapy but most recently had a right axillary lymph node that was biopsied and positive for metastatic carcinoma.  She presents for preoperative evaluation for upcoming procedure, total mastectomy right, right breast reconstruction with placement of tissue expander and flex HD, scheduled for 03/21/20 with Dr. Windell Moment (general surgery) and Dr. Mingo Amber.  The patient has not had problems with anesthesia. No history of DVT/PE.  No family history of DVT/PE.  No family or personal history of bleeding or clotting disorders.  Patient is not currently taking any blood thinners.  No history of CVA/MI.   Job: Does not work  Leonardo Significant for: Iron deficiency anemia, DM (reports last A1c < 7%), HLD, GERD.   Patient has preadmission scheduled for 03/14/20 at [facility]   Past Medical History: Allergies: Allergies  Allergen Reactions  . Compazine [Prochlorperazine Edisylate] Other (See Comments)    Acute dystonic reaction  . Morphine And Related Shortness Of Breath  . Doxycycline Other (See Comments)    "made all my symptoms worse"   . Fluticasone Other (See Comments)    Doesn't work  . Levofloxacin Other (See Comments)    "made all my symptoms worse"     Current Medications:  Current Outpatient Medications:  .  amphetamine-dextroamphetamine (ADDERALL XR) 25 MG 24 hr capsule, Take 25 mg by mouth daily., Disp: , Rfl:  .  Calcium Carb-Cholecalciferol (CALCIUM 600+D) 600-800 MG-UNIT TABS, Take 1 tablet by mouth daily., Disp: , Rfl:  .  cetirizine (ZYRTEC) 10 MG tablet, Take 10 mg by mouth  daily. , Disp: , Rfl:  .  dexamethasone (DECADRON) 4 MG tablet, Take 2 tablets by mouth once a day on the day after chemotherapy then 2 tablets two times a day for 2 days.  Take with food., Disp: 20 tablet, Rfl: 1 .  doxepin (SINEQUAN) 25 MG capsule, Take 25-50 mg by mouth at bedtime. , Disp: , Rfl:  .  ibuprofen (ADVIL) 200 MG tablet, Take 400-800 mg by mouth every 6 (six) hours as needed for headache or moderate pain., Disp: , Rfl:  .  lamoTRIgine (LAMICTAL) 200 MG tablet, Take 200 mg by mouth at bedtime. , Disp: , Rfl:  .  lidocaine-prilocaine (EMLA) cream, Apply topically over port-a-cath 30 minutes to 1 hour prior to treatment., Disp: 30 g, Rfl: 1 .  loratadine (CLARITIN) 10 MG tablet, Take 10 mg by mouth daily., Disp: , Rfl:  .  LORazepam (ATIVAN) 0.5 MG tablet, Take 0.5 mg by mouth every 8 (eight) hours as needed (nausea)., Disp: , Rfl:  .  lurasidone (LATUDA) 40 MG TABS tablet, Take 40 mg by mouth at bedtime. , Disp: , Rfl:  .  Lurasidone HCl 60 MG TABS, Take 60 mg by mouth at bedtime. , Disp: , Rfl:  .  metFORMIN (GLUCOPHAGE-XR) 500 MG 24 hr tablet, Take 1,000 mg by mouth 2 (two) times daily., Disp: , Rfl:  .  mometasone (NASONEX) 50 MCG/ACT nasal spray, Place 2 sprays into the nose daily as  needed (allergies). , Disp: , Rfl:  .  Multiple Vitamin (MULTIVITAMIN WITH MINERALS) TABS tablet, Take 1 tablet by mouth daily., Disp: , Rfl:  .  omeprazole (PRILOSEC) 20 MG capsule, Take 20 mg by mouth daily before breakfast. , Disp: , Rfl:  .  ondansetron (ZOFRAN) 8 MG tablet, Take 1 tablet (8 mg total) by mouth 2 (two) times daily as needed for refractory nausea / vomiting. Start on day 3 after carboplatin chemo., Disp: 30 tablet, Rfl: 1 .  potassium chloride (KLOR-CON) 10 MEQ tablet, Take 1 tablet (10 mEq total) by mouth daily., Disp: 20 tablet, Rfl: 0 .  QUEtiapine (SEROQUEL) 300 MG tablet, Take 300 mg by mouth at bedtime., Disp: , Rfl:  .  simvastatin (ZOCOR) 20 MG tablet, Take 20 mg by mouth  daily., Disp: , Rfl:  .  topiramate (TOPAMAX) 100 MG tablet, Take 50-100 mg by mouth See admin instructions. Take 1 tablet (100 mg) by mouth in the morning & take 0.5 tablet (50 mg) by mouth at night., Disp: , Rfl:  .  vitamin B-12 (CYANOCOBALAMIN) 1000 MCG tablet, Take 1,000 mcg by mouth daily., Disp: , Rfl:  .  HYDROcodone-acetaminophen (NORCO) 5-325 MG tablet, Take 1 tablet by mouth every 6 (six) hours as needed for up to 5 days for severe pain., Disp: 20 tablet, Rfl: 0 .  sulfamethoxazole-trimethoprim (BACTRIM DS) 800-160 MG tablet, Take 1 tablet by mouth 2 (two) times daily for 7 days., Disp: 14 tablet, Rfl: 0 No current facility-administered medications for this visit.  Facility-Administered Medications Ordered in Other Visits:  .  0.9 %  sodium chloride infusion, , Intravenous, Continuous, Lequita Asal, MD, Stopped at 01/08/20 1516 .  sodium chloride flush (NS) 0.9 % injection 10 mL, 10 mL, Intravenous, PRN, Mike Gip, Melissa C, MD, 10 mL at 01/08/20 1314  Past Medical Problems: Past Medical History:  Diagnosis Date  . Allergic rhinitis   . Anxiety   . Bipolar disorder ([place])    depression  . Cancer [facility])    breast right  . Depression   . Diabetes ([place])   . GERD (gastroesophageal reflux disease)   . Hyperlipidemia     Past Surgical History: Past Surgical History:  Procedure Laterality Date  . BREAST BIOPSY Right 07/22/2019   mass bx, path pending, heart marker  . BREAST BIOPSY Right 07/22/2019   LN bx, path pending,  butterfly hydromarker  . IMAGE GUIDED SINUS SURGERY    . KNEE SURGERY    . NASAL SEPTUM SURGERY    . PORTACATH PLACEMENT N/A 08/07/2019   Procedure: INSERTION PORT-A-CATH;  Surgeon: Herbert Pun, MD;  Location: [facility] ORS;  Service: General;  Laterality: N/A;  . TONSILLECTOMY      Social History: Social History   Socioeconomic History  . Marital status: Married    Spouse name: Not on file  . Number of children: Not on file  . Years of  education: Not on file  . Highest education level: Not on file  Occupational History  . Not on file  Tobacco Use  . Smoking status: Never Smoker  . Smokeless tobacco: Never Used  Vaping Use  . Vaping Use: Never used  Substance and Sexual Activity  . Alcohol use: No    Alcohol/week: 0.0 standard drinks  . Drug use: No  . Sexual activity: Not on file  Other Topics Concern  . Not on file  Social History Narrative  . Not on file   Social Determinants of Health  Financial Resource Strain:   . Difficulty of Paying Living Expenses:   Food Insecurity:   . Worried About Charity fundraiser in the Last Year:   . Arboriculturist in the Last Year:   Transportation Needs:   . Film/video editor (Medical):   Marland Kitchen Lack of Transportation (Non-Medical):   Physical Activity:   . Days of Exercise per Week:   . Minutes of Exercise per Session:   Stress:   . Feeling of Stress :   Social Connections:   . Frequency of Communication with Friends and Family:   . Frequency of Social Gatherings with Friends and Family:   . Attends Religious Services:   . Active Member of Clubs or Organizations:   . Attends Archivist Meetings:   Marland Kitchen Marital Status:   Intimate Partner Violence:   . Fear of Current or Ex-Partner:   . Emotionally Abused:   Marland Kitchen Physically Abused:   . Sexually Abused:     Family History: Family History  Problem Relation Age of Onset  . Lung cancer Father   . Heart attack Father   . Lung cancer Paternal Uncle   . Heart disease Paternal Uncle   . Lung cancer Paternal Aunt     Review of Systems: Review of Systems  Constitutional: Negative.   Respiratory: Negative.   Cardiovascular: Negative.   Gastrointestinal: Negative.     Physical Exam: Vital Signs BP (!) 102/61 (BP Location: Left Arm, Patient Position: Sitting, Cuff Size: Normal)   Pulse 96   Temp 99 F (37.2 C) (Oral)   Ht 5\' 6"  (1.676 m)   Wt 143 lb 12.8 oz (65.2 kg)   SpO2 99%   BMI 23.21 kg/m    Physical Exam Exam conducted with a chaperone present.  Constitutional:      General: She is not in acute distress.    Appearance: Normal appearance. She is not ill-appearing.  HENT:     Head: Normocephalic and atraumatic.  Eyes:     Pupils: Pupils are equal, round Neck:     Musculoskeletal: Normal range of motion.  Cardiovascular:     Rate and Rhythm: Normal rate and regular rhythm.     Pulses: Normal pulses.     Heart sounds: Normal heart sounds. No murmur.  Pulmonary:     Effort: Pulmonary effort is normal. No respiratory distress.     Breath sounds: Normal breath sounds. No wheezing.  Abdominal:     General: Abdomen is flat. There is no distension.     Palpations: Abdomen is soft.     Tenderness: There is no abdominal tenderness.  Musculoskeletal: Normal range of motion.  Skin:    General: Skin is warm and dry.     Findings: No erythema or rash.  Neurological:     General: No focal deficit present.     Mental Status: She is alert and oriented to person, place, and time. Mental status is at baseline.     Motor: No weakness.  Psychiatric:        Mood and Affect: Mood normal.        Behavior: Behavior normal.     Assessment/Plan: Patient is scheduled for total mastectomy (right) (Dr. Windell Moment) followed by right breast reconstruction with placement of tissue expander and flex HD with Dr. Mingo Amber.  Risks, benefits, and alternatives of procedure discussed, questions answered and consent obtained.    Smoking Status: Non smoker, reports occasional second hand smoking Last Mammogram: Triple  negative right breast cancer, most recent MM clip placement right on 03/07/20.  Caprini Score: 5; Risk Factors include: current breast cancer, age, and length of planned surgery. Recommendation for mechanical and pharmacological prophylaxis during surgery. Encourage early ambulation.   Post-op Rx sent to pharmacy: norco, bactrim   Patient was provided with the breast  reconstruction and General Surgical Risk consent document and Pain Medication Agreement prior to their appointment.  They had adequate time to read through the risk consent documents and Pain Medication Agreement. We also discussed them in person together during this preoperative appointment. All of their questions were answered to their satisfaction.  Recommended calling if they have any further questions.  Risk consent form and Pain Medication Agreement to be scanned into patient's chart.  The risks that can be encountered with and after placement of a breast expander placement were discussed and include the following but not limited to these: bleeding, infection, delayed healing, anesthesia risks, skin sensation changes, injury to structures including nerves, blood vessels, and muscles which may be temporary or permanent, allergies to tape, suture materials and glues, blood products, topical preparations or injected agents, skin contour irregularities, skin discoloration and swelling, deep vein thrombosis, cardiac and pulmonary complications, pain, which may persist, fluid accumulation, wrinkling of the skin over the expander, changes in nipple or breast sensation, expander leakage or rupture, faulty position of the expander, persistent pain, formation of tight scar tissue around the expander (capsular contracture), possible need for revisional surgery or staged procedures.  Patient reports she would like some home health assistance post-operatively due to her husband not being home during that time and she has previously discussed this with her insurance company. I informed her I would discuss this with clinical staff, have them set it up and update her  Electronically signed by: Charlies Constable, PA-C 03/10/2020 12:14 PM

## 2020-03-10 ENCOUNTER — Encounter: Payer: Self-pay | Admitting: Surgical

## 2020-03-10 ENCOUNTER — Ambulatory Visit (INDEPENDENT_AMBULATORY_CARE_PROVIDER_SITE_OTHER): Payer: BC Managed Care – PPO | Admitting: Surgical

## 2020-03-10 VITALS — BP 102/61 | HR 96 | Temp 99.0°F | Ht 66.0 in | Wt 143.8 lb

## 2020-03-10 DIAGNOSIS — C773 Secondary and unspecified malignant neoplasm of axilla and upper limb lymph nodes: Secondary | ICD-10-CM

## 2020-03-10 DIAGNOSIS — C50911 Malignant neoplasm of unspecified site of right female breast: Secondary | ICD-10-CM

## 2020-03-10 MED ORDER — SULFAMETHOXAZOLE-TRIMETHOPRIM 800-160 MG PO TABS: 1.0000 | ORAL_TABLET | Freq: Two times a day (BID) | ORAL | 0 refills | Status: AC

## 2020-03-10 MED ORDER — HYDROCODONE-ACETAMINOPHEN 5-325 MG PO TABS: 1.0000 | ORAL_TABLET | Freq: Four times a day (QID) | ORAL | 0 refills | Status: AC | PRN

## 2020-03-11 ENCOUNTER — Inpatient Hospital Stay: Payer: BC Managed Care – PPO

## 2020-03-11 ENCOUNTER — Other Ambulatory Visit: Payer: Self-pay

## 2020-03-11 ENCOUNTER — Telehealth: Payer: Self-pay

## 2020-03-11 VITALS — BP 98/70 | HR 92 | Temp 99.2°F

## 2020-03-11 DIAGNOSIS — D509 Iron deficiency anemia, unspecified: Secondary | ICD-10-CM

## 2020-03-11 DIAGNOSIS — C50511 Malignant neoplasm of lower-outer quadrant of right female breast: Secondary | ICD-10-CM | POA: Diagnosis not present

## 2020-03-11 MED ORDER — HEPARIN SOD (PORK) LOCK FLUSH 100 UNIT/ML IV SOLN
500.0000 [IU] | Freq: Once | INTRAVENOUS | Status: AC | PRN
  Administered 2020-03-11: 500 [IU]
  Filled 2020-03-11: qty 5

## 2020-03-11 MED ORDER — SODIUM CHLORIDE 0.9 % IV SOLN
Freq: Once | INTRAVENOUS | Status: AC
  Filled 2020-03-11: qty 250

## 2020-03-11 MED ORDER — SODIUM CHLORIDE 0.9% FLUSH
10.0000 mL | Freq: Once | INTRAVENOUS | Status: AC | PRN
  Administered 2020-03-11: 10 mL
  Filled 2020-03-11: qty 10

## 2020-03-11 NOTE — Telephone Encounter (Signed)
Called Tiffany, LMVM to follow up if she is able to help with the [facility] with drainage care.

## 2020-03-14 ENCOUNTER — Other Ambulatory Visit: Payer: Self-pay

## 2020-03-14 ENCOUNTER — Ambulatory Visit: Payer: Self-pay | Admitting: General Surgery

## 2020-03-14 ENCOUNTER — Encounter
Admission: RE | Admit: 2020-03-14 | Discharge: 2020-03-14 | Disposition: A | Discharge status: Home / Self Care | Payer: BC Managed Care – PPO | Source: Ambulatory Visit | Attending: General Surgery | Admitting: General Surgery

## 2020-03-14 NOTE — H&P (View-Only) (Signed)
HISTORY OF PRESENT ILLNESS:   Heather Bell is a 44 y.o.female patient who comes for preoperative evaluation for right breast cancer.  Heather Bell reports feeling a knot in her right breast a month ago. She notified her primary care physician. Diagnostic mammogram and ultrasound was ordered. The diagnostic mammogram and ultrasound showed a mass. An abnormal node was also identified on the right axilla. This led to core biopsy of both areas. 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   Patient was evaluated by medical oncology. She underwent neoadjuvant chemotherapy. She tolerated the neoadjuvant chemotherapy pretty well. She had follow-up ultrasound which shows a small decrease in size of the right breast mass. I personally evaluated the images.  Patient denies pain. There is no pain radiation. There is no alleviating or aggravating factor. She denies skin changes. She denies nipple discharge.  Family history of breast cancer, none (unknown history of mother side) Family history of other cancers: Lung cancer from his father side Menarche: 17-36 years old Last menstrual cycle: 2 months ago OCPs: Since 44 years old Number of pregnancies: 2 Estrogen therapy: None No history of preradiation to the chest No previous breast biopsy   PAST MEDICAL HISTORY:  Past Medical History:  Diagnosis Date  . Anxiety  . Bipolar affective disorder (CMS-HCC)  . Breast cancer (CMS-HCC)  . GERD (gastroesophageal reflux disease)  . Type 2 diabetes mellitus (CMS-HCC)     PAST SURGICAL HISTORY:  Past Surgical History:  Procedure Laterality Date  . APPENDECTOMY  . arthroscopy  . chemo port Left 08/07/2019  subclavian  . FUNCTIONAL ENDOSCOPIC SINUS SURGERY 2006  . TONSILLECTOMY    MEDICATIONS:  Outpatient Encounter Medications as of 02/18/2020  Medication Sig Dispense Refill  . ANUCORT-HC 25 mg suppository UNWRAP AND INSERT 1 SUPPOSITORY RECTALLY TWICE  DAILY FOR 2 WEEKS FOR HEMORRHOIDS  . blood-glucose meter Misc TRUETRACK BLOOD GLUCOSE DEVI  . calcium carbonate-vitamin D3 500 mg(1,250mg ) -125 unit per tablet Take 1 tablet by mouth once daily  . cetirizine (ZYRTEC) 10 MG tablet CETIRIZINE HCL 10 MG TABS  . cyanocobalamin (VITAMIN B12) 1000 MCG tablet Take 1 tablet by mouth once daily  . dexAMETHasone (DECADRON) 4 MG tablet Take 2 tablets by mouth once a day on the day after chemotherapy then 2 tablets two times a day for 2 days. Take with food.  Marland Kitchen dextroamphetamine-amphetamine (ADDERALL XR) 25 MG XR capsule Take 1 capsule by mouth once daily  . diazePAM (VALIUM) 10 MG tablet For dental procedures or MRI  . doxepin (SINEQUAN) 25 MG capsule Take 25-50 mg by mouth nightly  . ibuprofen (MOTRIN) 200 MG tablet Take by mouth as needed  . lamoTRIgine (LAMICTAL) 200 MG tablet Take 1 tablet by mouth once daily  . lancets TRUEPLUS LANCETS 26G  . lidocaine-prilocaine (EMLA) cream as needed  . LORazepam (ATIVAN) 0.5 MG tablet Take 0.5 mg by mouth every 8 (eight) hours as needed for Anxiety  . lurasidone (LATUDA) 40 mg tablet Take 40 mg by mouth nightly w/ 60 mg tab to equal 100 mg  . lurasidone (LATUDA) 60 mg tablet Take 60 mg by mouth nightly w/ 40 mg tab to equal 100 mg  . metFORMIN (GLUCOPHAGE) 500 MG tablet Take 1,000 mg by mouth 2 (two) times daily with meals  . mometasone (NASONEX) 50 mcg/actuation nasal spray 2 sprays by Each Nare route daily.  . multivitamin tablet Take 1 tablet by mouth once daily  . omeprazole (PRILOSEC)  20 MG DR capsule Take 20 mg by mouth once daily  . ondansetron (ZOFRAN) 8 MG tablet Take 8 mg by mouth every 12 (twelve) hours as needed for Nausea  . potassium chloride (KLOR-CON) 10 MEQ ER tablet Take 1 tablet by mouth once daily  . QUEtiapine (SEROQUEL XR) 300 MG 24 hr tablet Take 300 mg by mouth daily after dinner  . simvastatin (ZOCOR) 20 MG tablet Take 20 mg by mouth nightly  . topiramate (TOPAMAX) 100 MG tablet 1 tablet  every morning and 1/2 tablet every evening   No facility-administered encounter medications on file as of 02/18/2020.    ALLERGIES:  Compazine [prochlorperazine edisylate], Doxycycline, Fluticasone, Levofloxacin, and Morphine  SOCIAL HISTORY:  Social History   Socioeconomic History  . Marital status: Married  Spouse name: Not on file  . Number of children: Not on file  . Years of education: Not on file  . Highest education level: Not on file  Occupational History  . Not on file  Tobacco Use  . Smoking status: Never Smoker  . Smokeless tobacco: Never Used  Vaping Use  . Vaping Use: Never used  Substance and Sexual Activity  . Alcohol use: No  . Drug use: No  . Sexual activity: Not on file  Other Topics Concern  . Not on file  Social History Narrative  . Not on file   Social Determinants of Health   Financial Resource Strain:  . Difficulty of Paying Living Expenses:  Food Insecurity:  . Worried About Charity fundraiser in the Last Year:  . Arboriculturist in the Last Year:  Transportation Needs:  . Film/video editor (Medical):  Marland Kitchen Lack of Transportation (Non-Medical):   FAMILY HISTORY:  Family History  Problem Relation Age of Onset  . Heart disease Father  . Lung cancer Father  . Stroke Father  . Myocardial Infarction (Heart attack) Father  . Diabetes Father  . Lung cancer Maternal Uncle  . Lung cancer Paternal Aunt  . Lung cancer Paternal Uncle  . Heart disease Paternal Uncle  . Heart disease Maternal Grandfather    GENERAL REVIEW OF SYSTEMS:   General ROS: negative for - chills, fatigue, fever, weight gain or weight loss Allergy and Immunology ROS: negative for - hives  Hematological and Lymphatic ROS: negative for - bleeding problems or bruising, negative for palpable nodes Endocrine ROS: negative for - heat or cold intolerance, hair changes Respiratory ROS: negative for - cough, shortness of breath or wheezing Cardiovascular ROS: no chest pain or  palpitations GI ROS: negative for nausea, vomiting, abdominal pain, diarrhea, constipation Musculoskeletal ROS: negative for - joint swelling or muscle pain Neurological ROS: negative for - confusion, syncope Dermatological ROS: negative for pruritus and rash  PHYSICAL EXAM:  Vitals:  02/18/20 0836  BP: 101/61  Pulse: 107  .  Ht:166.4 cm (5' 5.5") Wt:62.6 kg (138 lb) RDE:YCXK surface area is 1.7 meters squared. Body mass index is 22.62 kg/m.Marland Kitchen  GENERAL: Alert, active, oriented x3  HEENT: Pupils equal reactive to light. Extraocular movements are intact. Sclera clear. Palpebral conjunctiva normal red color.Pharynx clear.  NECK: Supple with no palpable mass and no adenopathy.  LUNGS: Sound clear with no rales rhonchi or wheezes.  HEART: Regular rhythm S1 and S2 without murmur.  BREAST: Both breasts were examined in the sitting and supine position. Right breast without palpable mass, skin changes or nipple retraction. There is no nipple discharge. There is no axillary adenopathy. Left breast without any  palpable mass, skin changes, nipple discharge. No axillary adenopathy.  ABDOMEN: Soft and depressible, nontender with no palpable mass, no hepatomegaly.   EXTREMITIES: Well-developed well-nourished symmetrical with no dependent edema.  NEUROLOGICAL: Awake alert oriented, facial expression symmetrical, moving all extremities.   IMPRESSION:   Breast cancer metastasized to axillary lymph node, right (CMS-HCC) [C50.911, C77.3]  - Clinical T1cN1triple negativerightbreast cancer status post neoadjuvant chemotherapy. -Patient completed neoadjuvant chemotherapy on 02/16/2020 -Patient had ultrasound of the right breast on April showing a small decrease in size of the right breast mass at 7 o'clock position. -Patient considered for for evaluation of surgical management. I discussed with the patient the alternative of partial mastectomy versus total mastectomy. Patient is leaning toward  total mastectomy but she wants evaluation by plastic surgery to further discuss. -I also discussed with the patient the alternative of sentinel in the biopsy basis complete axillary node dissection. I will discuss with oncology to see if I am able to remove the positive lymph node and at least 3 sentinel lymph nodes and if all of them are negative to try to avoid a complete axillary dissection. Patient understand the possible need of complete axillary dissection.  PLAN:  1. Right total mastectomy with immediate reconstruction 2. We discussed about evaluation of the lymph nodes during the surgery with the possibility of the need of a complete axillary node dissection 3. Contact us at any time if you have any concern.  Patient verbalized understanding, all questions were answered, and were agreeable with the plan outlined above.   Herbert Pun, MD

## 2020-03-14 NOTE — Patient Instructions (Signed)
Your procedure is scheduled on: 03/21/20 Report to [facility]. To find out your arrival time please call [phone] between 1PM - 3PM on 03/18/20.  Remember: Instructions that are not followed completely may result in serious medical risk, up to and including death, or upon the discretion of your surgeon and anesthesiologist your surgery may need to be rescheduled.     _X__ 1. Do not eat food after midnight the night before your procedure.                 No gum chewing or hard candies. You may drink clear liquids up to 2 hours                 before you are scheduled to arrive for your surgery- DO not drink clear                 liquids within 2 hours of the start of your surgery.                 Clear Liquids include:  water, apple juice without pulp, clear carbohydrate                 drink such as Clearfast or Gatorade, Black Coffee or Tea (Do not add                 anything to coffee or tea). Diabetics water only  __X__2.  On the morning of surgery brush your teeth with toothpaste and water, you                 may rinse your mouth with mouthwash if you wish.  Do not swallow any              toothpaste of mouthwash.     _X__ 3.  No Alcohol for 24 hours before or after surgery.   _X__ 4.  Do Not Smoke or use e-cigarettes For 24 Hours Prior to Your Surgery.                 Do not use any chewable tobacco products for at least 6 hours prior to                 surgery.  ____  5.  Bring all medications with you on the day of surgery if instructed.   __X__  6.  Notify your doctor if there is any change in your medical condition      (cold, fever, infections).     Do not wear jewelry, make-up, hairpins, clips or nail polish. Do not wear lotions, powders, or perfumes.  Do not shave 48 hours prior to surgery. Men may shave face and neck. Do not bring valuables to the hospital.    [facility] is not responsible for any belongings or  valuables.  Contacts, dentures/partials or body piercings may not be worn into surgery. Bring a case for your contacts, glasses or hearing aids, a denture cup will be supplied. Leave your suitcase in the car. After surgery it may be brought to your room. For patients admitted to the hospital, discharge time is determined by your treatment team.   Patients discharged the day of surgery will not be allowed to drive home.   Please read over the following fact sheets that you were given:   MRSA Information  __X__ Take these medicines the morning of surgery with A SIP OF WATER:  1. cetirizine (ZYRTEC) 10 MG tablet  2. omeprazole (PRILOSEC) 20 MG capsule  3. simvastatin (ZOCOR) 20 MG tablet  4. topiramate (TOPAMAX) 100 MG tablet  5.  6.  ____ Fleet Enema (as directed)   __X__ Use CHG Soap/SAGE wipes as directed  ____ Use inhalers on the day of surgery  __X__ Stop metformin/Janumet/Farxiga 2 days prior to surgery    ____ Take 1/2 of usual insulin dose the night before surgery. No insulin the morning          of surgery.   ____ Stop Blood Thinners Coumadin/Plavix/Xarelto/Pleta/Pradaxa/Eliquis/Effient/Aspirin  on   Or contact your Surgeon, Cardiologist or Medical Doctor regarding  ability to stop your blood thinners  __X__ Stop Anti-inflammatories 7 days before surgery such as Advil, Ibuprofen, Motrin,  BC or Goodies Powder, Naprosyn, Naproxen, Aleve, Aspirin    __X__ Stop all herbal supplements, fish oil or vitamin E until after surgery.    ____ Bring C-Pap to the hospital.

## 2020-03-14 NOTE — H&P (Signed)
HISTORY OF PRESENT ILLNESS:   Ms. Albornoz is a 44 y.o.female patient who comes for preoperative evaluation for right breast cancer.  Ms. Kantor reports feeling a knot in her right breast a month ago. She notified her primary care physician. Diagnostic mammogram and ultrasound was ordered. The diagnostic mammogram and ultrasound showed a mass. An abnormal node was also identified on the right axilla. This led to core biopsy of both areas. 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   Patient was evaluated by medical oncology. She underwent neoadjuvant chemotherapy. She tolerated the neoadjuvant chemotherapy pretty well. She had follow-up ultrasound which shows a small decrease in size of the right breast mass. I personally evaluated the images.  Patient denies pain. There is no pain radiation. There is no alleviating or aggravating factor. She denies skin changes. She denies nipple discharge.  Family history of breast cancer, none (unknown history of mother side) Family history of other cancers: Lung cancer from his father side Menarche: [age] years old Last menstrual cycle: 2 months ago OCPs: Since 44 years old Number of pregnancies: 2 Estrogen therapy: None No history of preradiation to the chest No previous breast biopsy   PAST MEDICAL HISTORY:  Past Medical History:  Diagnosis Date  . Anxiety  . Bipolar affective disorder (CMS-HCC)  . Breast cancer (CMS-HCC)  . GERD (gastroesophageal reflux disease)  . Type 2 diabetes mellitus (CMS-HCC)     PAST SURGICAL HISTORY:  Past Surgical History:  Procedure Laterality Date  . APPENDECTOMY  . arthroscopy  . chemo port Left 08/07/2019  subclavian  . FUNCTIONAL ENDOSCOPIC SINUS SURGERY 2006  . TONSILLECTOMY    MEDICATIONS:  Outpatient Encounter Medications as of 02/18/2020  Medication Sig Dispense Refill  . ANUCORT-HC 25 mg suppository UNWRAP AND INSERT 1 SUPPOSITORY RECTALLY TWICE  DAILY FOR 2 WEEKS FOR HEMORRHOIDS  . blood-glucose meter Misc TRUETRACK BLOOD GLUCOSE DEVI  . calcium carbonate-vitamin D3 500 mg(1,250mg ) -125 unit per tablet Take 1 tablet by mouth once daily  . cetirizine (ZYRTEC) 10 MG tablet CETIRIZINE HCL 10 MG TABS  . cyanocobalamin (VITAMIN B12) 1000 MCG tablet Take 1 tablet by mouth once daily  . dexAMETHasone (DECADRON) 4 MG tablet Take 2 tablets by mouth once a day on the day after chemotherapy then 2 tablets two times a day for 2 days. Take with food.  Marland Kitchen dextroamphetamine-amphetamine (ADDERALL XR) 25 MG XR capsule Take 1 capsule by mouth once daily  . diazePAM (VALIUM) 10 MG tablet For dental procedures or MRI  . doxepin (SINEQUAN) 25 MG capsule Take 25-50 mg by mouth nightly  . ibuprofen (MOTRIN) 200 MG tablet Take by mouth as needed  . lamoTRIgine (LAMICTAL) 200 MG tablet Take 1 tablet by mouth once daily  . lancets TRUEPLUS LANCETS 26G  . lidocaine-prilocaine (EMLA) cream as needed  . LORazepam (ATIVAN) 0.5 MG tablet Take 0.5 mg by mouth every 8 (eight) hours as needed for Anxiety  . lurasidone (LATUDA) 40 mg tablet Take 40 mg by mouth nightly w/ 60 mg tab to equal 100 mg  . lurasidone (LATUDA) 60 mg tablet Take 60 mg by mouth nightly w/ 40 mg tab to equal 100 mg  . metFORMIN (GLUCOPHAGE) 500 MG tablet Take 1,000 mg by mouth 2 (two) times daily with meals  . mometasone (NASONEX) 50 mcg/actuation nasal spray 2 sprays by Each Nare route daily.  . multivitamin tablet Take 1 tablet by mouth once daily  . omeprazole (PRILOSEC)  20 MG DR capsule Take 20 mg by mouth once daily  . ondansetron (ZOFRAN) 8 MG tablet Take 8 mg by mouth every 12 (twelve) hours as needed for Nausea  . potassium chloride (KLOR-CON) 10 MEQ ER tablet Take 1 tablet by mouth once daily  . QUEtiapine (SEROQUEL XR) 300 MG 24 hr tablet Take 300 mg by mouth daily after dinner  . simvastatin (ZOCOR) 20 MG tablet Take 20 mg by mouth nightly  . topiramate (TOPAMAX) 100 MG tablet 1 tablet  every morning and 1/2 tablet every evening   No facility-administered encounter medications on file as of 02/18/2020.    ALLERGIES:  Compazine [prochlorperazine edisylate], Doxycycline, Fluticasone, Levofloxacin, and Morphine  SOCIAL HISTORY:  Social History   Socioeconomic History  . Marital status: Married  Spouse name: Not on file  . Number of children: Not on file  . Years of education: Not on file  . Highest education level: Not on file  Occupational History  . Not on file  Tobacco Use  . Smoking status: Never Smoker  . Smokeless tobacco: Never Used  Vaping Use  . Vaping Use: Never used  Substance and Sexual Activity  . Alcohol use: No  . Drug use: No  . Sexual activity: Not on file  Other Topics Concern  . Not on file  Social History Narrative  . Not on file   Social Determinants of Health   Financial Resource Strain:  . Difficulty of Paying Living Expenses:  Food Insecurity:  . Worried About Charity fundraiser in the Last Year:  . Arboriculturist in the Last Year:  Transportation Needs:  . Film/video editor (Medical):  Marland Kitchen Lack of Transportation (Non-Medical):   FAMILY HISTORY:  Family History  Problem Relation Age of Onset  . Heart disease Father  . Lung cancer Father  . Stroke Father  . Myocardial Infarction (Heart attack) Father  . Diabetes Father  . Lung cancer Maternal Uncle  . Lung cancer Paternal Aunt  . Lung cancer Paternal Uncle  . Heart disease Paternal Uncle  . Heart disease Maternal Grandfather    GENERAL REVIEW OF SYSTEMS:   General ROS: negative for - chills, fatigue, fever, weight gain or weight loss Allergy and Immunology ROS: negative for - hives  Hematological and Lymphatic ROS: negative for - bleeding problems or bruising, negative for palpable nodes Endocrine ROS: negative for - heat or cold intolerance, hair changes Respiratory ROS: negative for - cough, shortness of breath or wheezing Cardiovascular ROS: no chest pain or  palpitations GI ROS: negative for nausea, vomiting, abdominal pain, diarrhea, constipation Musculoskeletal ROS: negative for - joint swelling or muscle pain Neurological ROS: negative for - confusion, syncope Dermatological ROS: negative for pruritus and rash  PHYSICAL EXAM:  Vitals:  02/18/20 0836  BP: 101/61  Pulse: 107  .  Ht:166.4 cm (5' 5.5") Wt:62.6 kg (138 lb) LYY:TKPT surface area is 1.7 meters squared. Body mass index is 22.62 kg/m.Marland Kitchen  GENERAL: Alert, active, oriented x3  HEENT: Pupils equal reactive to light. Extraocular movements are intact. Sclera clear. Palpebral conjunctiva normal red color.Pharynx clear.  NECK: Supple with no palpable mass and no adenopathy.  LUNGS: Sound clear with no rales rhonchi or wheezes.  HEART: Regular rhythm S1 and S2 without murmur.  BREAST: Both breasts were examined in the sitting and supine position. Right breast without palpable mass, skin changes or nipple retraction. There is no nipple discharge. There is no axillary adenopathy. Left breast without any  palpable mass, skin changes, nipple discharge. No axillary adenopathy.  ABDOMEN: Soft and depressible, nontender with no palpable mass, no hepatomegaly.   EXTREMITIES: Well-developed well-nourished symmetrical with no dependent edema.  NEUROLOGICAL: Awake alert oriented, facial expression symmetrical, moving all extremities.   IMPRESSION:   Breast cancer metastasized to axillary lymph node, right (CMS-HCC) [C50.911, C77.3]  - Clinical T1cN1triple negativerightbreast cancer status post neoadjuvant chemotherapy. -Patient completed neoadjuvant chemotherapy on 02/16/2020 -Patient had ultrasound of the right breast on April showing a small decrease in size of the right breast mass at 7 o'clock position. -Patient considered for for evaluation of surgical management. I discussed with the patient the alternative of partial mastectomy versus total mastectomy. Patient is leaning toward  total mastectomy but she wants evaluation by plastic surgery to further discuss. -I also discussed with the patient the alternative of sentinel in the biopsy basis complete axillary node dissection. I will discuss with oncology to see if I am able to remove the positive lymph node and at least 3 sentinel lymph nodes and if all of them are negative to try to avoid a complete axillary dissection. Patient understand the possible need of complete axillary dissection.  PLAN:  1. Right total mastectomy with immediate reconstruction 2. We discussed about evaluation of the lymph nodes during the surgery with the possibility of the need of a complete axillary node dissection 3. Contact us at any time if you have any concern.  Patient verbalized understanding, all questions were answered, and were agreeable with the plan outlined above.   Herbert Pun, MD

## 2020-03-15 ENCOUNTER — Telehealth: Payer: Self-pay | Admitting: Plastic Surgery

## 2020-03-15 NOTE — Telephone Encounter (Signed)
Tiffany from [facility] calling to follow up with Olivia Mackie regarding patient.

## 2020-03-15 NOTE — Telephone Encounter (Signed)
Returned Tiffany's call. [facility] is unable to help due to her insurance is out of network. Suggested to call [facility]

## 2020-03-16 NOTE — Telephone Encounter (Signed)
Called [facility], left message on VM to see if they will take patient for drain care.

## 2020-03-17 ENCOUNTER — Other Ambulatory Visit: Payer: BC Managed Care – PPO

## 2020-03-17 NOTE — Telephone Encounter (Signed)
Called [place] at [facility] to see if they were available to take care of patient for drain care after surgery

## 2020-03-18 ENCOUNTER — Other Ambulatory Visit
Admission: RE | Admit: 2020-03-18 | Discharge: 2020-03-18 | Disposition: A | Discharge status: Home / Self Care | Payer: BC Managed Care – PPO | Source: Ambulatory Visit | Attending: General Surgery | Admitting: General Surgery

## 2020-03-18 ENCOUNTER — Other Ambulatory Visit: Payer: Self-pay

## 2020-03-18 ENCOUNTER — Other Ambulatory Visit: Payer: Self-pay | Admitting: Sports Medicine

## 2020-03-18 DIAGNOSIS — Z01812 Encounter for preprocedural laboratory examination: Secondary | ICD-10-CM | POA: Insufficient documentation

## 2020-03-18 DIAGNOSIS — M25512 Pain in left shoulder: Secondary | ICD-10-CM

## 2020-03-18 DIAGNOSIS — Z20822 Contact with and (suspected) exposure to covid-19: Secondary | ICD-10-CM | POA: Insufficient documentation

## 2020-03-18 DIAGNOSIS — M7542 Impingement syndrome of left shoulder: Secondary | ICD-10-CM

## 2020-03-18 DIAGNOSIS — M7552 Bursitis of left shoulder: Secondary | ICD-10-CM

## 2020-03-18 LAB — SARS CORONAVIRUS 2 (TAT 6-24 HRS): SARS Coronavirus 2: NEGATIVE

## 2020-03-18 NOTE — Telephone Encounter (Signed)
Called patient and advised I found a home health company who can come out on 03/24/20 and help with the drainage care. She declined offer. Called Juliann Pulse at [facility] and advised patient did not want to pursue service.

## 2020-03-18 NOTE — Telephone Encounter (Signed)
Called [facility], [facility], [facility], [facility], and [facility]. Neither of these companies took patients insurance, nor did not have a nurse to cover the area where the [place] lives. Called [facility] and spoke to Newell, they do accept her insurance, but will not have a nurse until 03/24/20.  Have a call into [facility], [place] to discuss.

## 2020-03-21 ENCOUNTER — Encounter: Payer: Self-pay | Admitting: General Surgery

## 2020-03-21 ENCOUNTER — Other Ambulatory Visit: Payer: Self-pay

## 2020-03-21 ENCOUNTER — Encounter: Admission: RE | Disposition: A | Discharge status: Home / Self Care | Payer: Self-pay | Attending: General Surgery

## 2020-03-21 ENCOUNTER — Ambulatory Visit (HOSPITAL_BASED_OUTPATIENT_CLINIC_OR_DEPARTMENT_OTHER)
Admission: RE | Admit: 2020-03-21 | Discharge: 2020-03-21 | Disposition: A | Discharge status: Home / Self Care | Payer: BC Managed Care – PPO | Source: Ambulatory Visit | Attending: General Surgery | Admitting: General Surgery

## 2020-03-21 ENCOUNTER — Ambulatory Visit
Admission: RE | Admit: 2020-03-21 | Discharge: 2020-03-21 | Disposition: A | Discharge status: Home / Self Care | Payer: BC Managed Care – PPO | Source: Ambulatory Visit | Attending: General Surgery | Admitting: General Surgery

## 2020-03-21 ENCOUNTER — Observation Stay
Admission: RE | Admit: 2020-03-21 | Discharge: 2020-03-22 | Disposition: A | Discharge status: Home / Self Care | Payer: BC Managed Care – PPO | Attending: General Surgery | Admitting: General Surgery

## 2020-03-21 ENCOUNTER — Ambulatory Visit: Payer: BC Managed Care – PPO | Admitting: Certified Registered"

## 2020-03-21 DIAGNOSIS — Z7984 Long term (current) use of oral hypoglycemic drugs: Secondary | ICD-10-CM | POA: Insufficient documentation

## 2020-03-21 DIAGNOSIS — C773 Secondary and unspecified malignant neoplasm of axilla and upper limb lymph nodes: Secondary | ICD-10-CM | POA: Insufficient documentation

## 2020-03-21 DIAGNOSIS — E119 Type 2 diabetes mellitus without complications: Secondary | ICD-10-CM | POA: Diagnosis not present

## 2020-03-21 DIAGNOSIS — C50919 Malignant neoplasm of unspecified site of unspecified female breast: Secondary | ICD-10-CM

## 2020-03-21 DIAGNOSIS — C50911 Malignant neoplasm of unspecified site of right female breast: Secondary | ICD-10-CM | POA: Diagnosis not present

## 2020-03-21 HISTORY — DX: Malignant neoplasm of unspecified site of unspecified female breast: C50.919

## 2020-03-21 HISTORY — PX: MASTECTOMY: SHX3

## 2020-03-21 HISTORY — PX: BREAST RECONSTRUCTION WITH PLACEMENT OF TISSUE EXPANDER AND FLEX HD (ACELLULAR HYDRATED DERMIS): SHX6295

## 2020-03-21 HISTORY — PX: TOTAL MASTECTOMY: SHX6129

## 2020-03-21 LAB — GLUCOSE, CAPILLARY
Glucose-Capillary: 101 mg/dL — ABNORMAL HIGH (ref 70–99)
Glucose-Capillary: 197 mg/dL — ABNORMAL HIGH (ref 70–99)

## 2020-03-21 LAB — POCT PREGNANCY, URINE: Preg Test, Ur: NEGATIVE

## 2020-03-21 SURGERY — MASTECTOMY, SIMPLE
Anesthesia: General | Site: Breast | Laterality: Right

## 2020-03-21 MED ORDER — CALCIUM CARBONATE-VITAMIN D 500-200 MG-UNIT PO TABS
1.0000 | ORAL_TABLET | Freq: Every day | ORAL | Status: DC
  Administered 2020-03-22: 1 via ORAL
  Filled 2020-03-21: qty 1

## 2020-03-21 MED ORDER — QUETIAPINE FUMARATE 300 MG PO TABS
300.0000 mg | ORAL_TABLET | Freq: Every day | ORAL | Status: DC
  Administered 2020-03-21: 300 mg via ORAL
  Filled 2020-03-21 (×2): qty 1

## 2020-03-21 MED ORDER — IBUPROFEN 400 MG PO TABS: 400.0000 mg | ORAL_TABLET | Freq: Four times a day (QID) | ORAL | Status: DC | PRN

## 2020-03-21 MED ORDER — MIDAZOLAM HCL 2 MG/2ML IJ SOLN
INTRAMUSCULAR | Status: AC
  Filled 2020-03-21: qty 2

## 2020-03-21 MED ORDER — DIPHENHYDRAMINE HCL 50 MG/ML IJ SOLN
INTRAMUSCULAR | Status: DC | PRN
  Administered 2020-03-21: 12.5 mg via INTRAVENOUS

## 2020-03-21 MED ORDER — METHYLENE BLUE 0.5 % INJ SOLN
INTRAVENOUS | Status: AC
  Filled 2020-03-21: qty 10

## 2020-03-21 MED ORDER — ENOXAPARIN SODIUM 40 MG/0.4ML ~~LOC~~ SOLN
40.0000 mg | SUBCUTANEOUS | Status: DC
  Administered 2020-03-22: 40 mg via SUBCUTANEOUS
  Filled 2020-03-21: qty 0.4

## 2020-03-21 MED ORDER — TOPIRAMATE 25 MG PO TABS
50.0000 mg | ORAL_TABLET | Freq: Every day | ORAL | Status: DC
  Administered 2020-03-21: 50 mg via ORAL
  Filled 2020-03-21 (×2): qty 2

## 2020-03-21 MED ORDER — LORAZEPAM 0.5 MG PO TABS: 0.5000 mg | ORAL_TABLET | Freq: Three times a day (TID) | ORAL | Status: DC | PRN

## 2020-03-21 MED ORDER — DOXEPIN HCL 25 MG PO CAPS
25.0000 mg | ORAL_CAPSULE | Freq: Every day | ORAL | Status: DC
  Administered 2020-03-21: 50 mg via ORAL
  Filled 2020-03-21 (×2): qty 2

## 2020-03-21 MED ORDER — AMPHETAMINE-DEXTROAMPHET ER 5 MG PO CP24
25.0000 mg | ORAL_CAPSULE | Freq: Every day | ORAL | Status: DC
  Filled 2020-03-21: qty 5

## 2020-03-21 MED ORDER — INDOCYANINE GREEN 25 MG IV SOLR
INTRAVENOUS | Status: DC | PRN
Start: 2020-03-21 — End: 2020-03-21
  Administered 2020-03-21 (×2): 7.5 mg via INTRAVENOUS

## 2020-03-21 MED ORDER — TECHNETIUM TC 99M SULFUR COLLOID FILTERED
1.0000 | Freq: Once | INTRAVENOUS | Status: AC | PRN
  Administered 2020-03-21: 0.755 via INTRADERMAL

## 2020-03-21 MED ORDER — SODIUM CHLORIDE FLUSH 0.9 % IV SOLN
INTRAVENOUS | Status: AC
  Filled 2020-03-21: qty 50

## 2020-03-21 MED ORDER — HYDROCODONE-ACETAMINOPHEN 5-325 MG PO TABS
1.0000 | ORAL_TABLET | ORAL | Status: DC | PRN
  Administered 2020-03-21: 1 via ORAL
  Administered 2020-03-21 – 2020-03-22 (×4): 2 via ORAL
  Filled 2020-03-21 (×4): qty 2

## 2020-03-21 MED ORDER — FENTANYL CITRATE (PF) 100 MCG/2ML IJ SOLN
INTRAMUSCULAR | Status: AC
  Administered 2020-03-21: 25 ug via INTRAVENOUS
  Filled 2020-03-21: qty 2

## 2020-03-21 MED ORDER — SIMVASTATIN 20 MG PO TABS
20.0000 mg | ORAL_TABLET | Freq: Every day | ORAL | Status: DC
  Filled 2020-03-21: qty 1

## 2020-03-21 MED ORDER — BUPIVACAINE LIPOSOME 1.3 % IJ SUSP
INTRAMUSCULAR | Status: AC
  Filled 2020-03-21: qty 20

## 2020-03-21 MED ORDER — SUGAMMADEX SODIUM 200 MG/2ML IV SOLN
INTRAVENOUS | Status: DC | PRN
  Administered 2020-03-21: 200 mg via INTRAVENOUS

## 2020-03-21 MED ORDER — FENTANYL CITRATE (PF) 100 MCG/2ML IJ SOLN
25.0000 ug | INTRAMUSCULAR | Status: DC | PRN
  Administered 2020-03-21 – 2020-03-22 (×4): 25 ug via INTRAVENOUS
  Filled 2020-03-21 (×4): qty 2

## 2020-03-21 MED ORDER — METFORMIN HCL ER 500 MG PO TB24
1000.0000 mg | ORAL_TABLET | Freq: Two times a day (BID) | ORAL | Status: DC
  Administered 2020-03-22: 1000 mg via ORAL
  Filled 2020-03-21 (×3): qty 2

## 2020-03-21 MED ORDER — FENTANYL CITRATE (PF) 100 MCG/2ML IJ SOLN
INTRAMUSCULAR | Status: DC | PRN
  Administered 2020-03-21: 25 ug via INTRAVENOUS
  Administered 2020-03-21: 75 ug via INTRAVENOUS
  Administered 2020-03-21 (×4): 25 ug via INTRAVENOUS
  Administered 2020-03-21: 50 ug via INTRAVENOUS

## 2020-03-21 MED ORDER — LORATADINE 10 MG PO TABS
10.0000 mg | ORAL_TABLET | Freq: Every day | ORAL | Status: DC
  Administered 2020-03-22: 10 mg via ORAL
  Filled 2020-03-21: qty 1

## 2020-03-21 MED ORDER — LURASIDONE HCL 40 MG PO TABS
40.0000 mg | ORAL_TABLET | Freq: Every day | ORAL | Status: DC
  Administered 2020-03-21: 40 mg via ORAL
  Filled 2020-03-21 (×2): qty 1

## 2020-03-21 MED ORDER — SIMVASTATIN 20 MG PO TABS: 20.0000 mg | ORAL_TABLET | Freq: Every day | ORAL | Status: DC

## 2020-03-21 MED ORDER — ROCURONIUM BROMIDE 100 MG/10ML IV SOLN
INTRAVENOUS | Status: DC | PRN
  Administered 2020-03-21: 50 mg via INTRAVENOUS
  Administered 2020-03-21 (×2): 10 mg via INTRAVENOUS

## 2020-03-21 MED ORDER — PANTOPRAZOLE SODIUM 40 MG PO TBEC
40.0000 mg | DELAYED_RELEASE_TABLET | Freq: Every day | ORAL | Status: DC
  Administered 2020-03-22: 40 mg via ORAL
  Filled 2020-03-21: qty 1

## 2020-03-21 MED ORDER — PROPOFOL 500 MG/50ML IV EMUL
INTRAVENOUS | Status: AC
  Filled 2020-03-21: qty 50

## 2020-03-21 MED ORDER — POTASSIUM CHLORIDE CRYS ER 10 MEQ PO TBCR
10.0000 meq | EXTENDED_RELEASE_TABLET | Freq: Every day | ORAL | Status: DC
  Administered 2020-03-22: 10 meq via ORAL
  Filled 2020-03-21: qty 1

## 2020-03-21 MED ORDER — LIDOCAINE HCL (CARDIAC) PF 100 MG/5ML IV SOSY
PREFILLED_SYRINGE | INTRAVENOUS | Status: DC | PRN
  Administered 2020-03-21: 40 mg via INTRAVENOUS

## 2020-03-21 MED ORDER — LACTATED RINGERS IV SOLN: INTRAVENOUS | Status: DC | PRN

## 2020-03-21 MED ORDER — HYDROCODONE-ACETAMINOPHEN 5-325 MG PO TABS
ORAL_TABLET | ORAL | Status: AC
  Filled 2020-03-21: qty 1

## 2020-03-21 MED ORDER — TOPIRAMATE 25 MG PO TABS: 50.0000 mg | ORAL_TABLET | ORAL | Status: DC

## 2020-03-21 MED ORDER — FENTANYL CITRATE (PF) 250 MCG/5ML IJ SOLN
INTRAMUSCULAR | Status: AC
  Filled 2020-03-21: qty 5

## 2020-03-21 MED ORDER — LAMOTRIGINE 100 MG PO TABS
200.0000 mg | ORAL_TABLET | Freq: Every day | ORAL | Status: DC
  Administered 2020-03-21: 200 mg via ORAL
  Filled 2020-03-21: qty 2

## 2020-03-21 MED ORDER — ONDANSETRON HCL 4 MG/2ML IJ SOLN
INTRAMUSCULAR | Status: DC | PRN
Start: 2020-03-21 — End: 2020-03-21
  Administered 2020-03-21: 4 mg via INTRAVENOUS

## 2020-03-21 MED ORDER — CEFAZOLIN SODIUM-DEXTROSE 2-4 GM/100ML-% IV SOLN
INTRAVENOUS | Status: AC
  Filled 2020-03-21: qty 100

## 2020-03-21 MED ORDER — CEFAZOLIN SODIUM 1 G IJ SOLR
INTRAMUSCULAR | Status: AC
  Filled 2020-03-21: qty 10

## 2020-03-21 MED ORDER — STERILE WATER FOR INJECTION IJ SOLN
INTRAMUSCULAR | Status: AC
  Filled 2020-03-21: qty 10

## 2020-03-21 MED ORDER — FENTANYL CITRATE (PF) 100 MCG/2ML IJ SOLN
25.0000 ug | INTRAMUSCULAR | Status: AC | PRN
  Administered 2020-03-21 (×6): 25 ug via INTRAVENOUS

## 2020-03-21 MED ORDER — TOPIRAMATE 100 MG PO TABS
100.0000 mg | ORAL_TABLET | Freq: Every day | ORAL | Status: DC
  Administered 2020-03-22: 100 mg via ORAL
  Filled 2020-03-21: qty 1

## 2020-03-21 MED ORDER — LURASIDONE HCL 40 MG PO TABS
60.0000 mg | ORAL_TABLET | Freq: Every day | ORAL | Status: DC
  Administered 2020-03-21: 60 mg via ORAL
  Filled 2020-03-21 (×2): qty 2

## 2020-03-21 MED ORDER — ESMOLOL HCL 100 MG/10ML IV SOLN
INTRAVENOUS | Status: DC | PRN
Start: 2020-03-21 — End: 2020-03-21
  Administered 2020-03-21: 10 mg via INTRAVENOUS

## 2020-03-21 MED ORDER — CHLORHEXIDINE GLUCONATE 0.12 % MT SOLN
OROMUCOSAL | Status: AC
  Administered 2020-03-21: 15 mL
  Filled 2020-03-21: qty 15

## 2020-03-21 MED ORDER — BUPIVACAINE-EPINEPHRINE 0.25% -1:200000 IJ SOLN
INTRAMUSCULAR | Status: DC | PRN
  Administered 2020-03-21: 30 mL

## 2020-03-21 MED ORDER — DEXAMETHASONE SODIUM PHOSPHATE 10 MG/ML IJ SOLN
INTRAMUSCULAR | Status: DC | PRN
Start: 2020-03-21 — End: 2020-03-21
  Administered 2020-03-21: 10 mg via INTRAVENOUS

## 2020-03-21 MED ORDER — BUPIVACAINE-EPINEPHRINE (PF) 0.25% -1:200000 IJ SOLN
INTRAMUSCULAR | Status: AC
  Filled 2020-03-21: qty 30

## 2020-03-21 MED ORDER — ONDANSETRON 4 MG PO TBDP: 4.0000 mg | ORAL_TABLET | Freq: Four times a day (QID) | ORAL | Status: DC | PRN

## 2020-03-21 MED ORDER — PROPOFOL 10 MG/ML IV BOLUS
INTRAVENOUS | Status: DC | PRN
  Administered 2020-03-21: 150 mg via INTRAVENOUS

## 2020-03-21 MED ORDER — ONDANSETRON HCL 4 MG/2ML IJ SOLN: 4.0000 mg | Freq: Once | INTRAMUSCULAR | Status: DC | PRN

## 2020-03-21 MED ORDER — SODIUM CHLORIDE 0.9 % IV SOLN: INTRAVENOUS | Status: DC

## 2020-03-21 MED ORDER — PHENYLEPHRINE HCL (PRESSORS) 10 MG/ML IV SOLN
INTRAVENOUS | Status: DC | PRN
  Administered 2020-03-21: 100 ug via INTRAVENOUS

## 2020-03-21 MED ORDER — MIDAZOLAM HCL 2 MG/2ML IJ SOLN
INTRAMUSCULAR | Status: DC | PRN
  Administered 2020-03-21: 2 mg via INTRAVENOUS

## 2020-03-21 MED ORDER — BUPIVACAINE LIPOSOME 1.3 % IJ SUSP
INTRAMUSCULAR | Status: DC | PRN
  Administered 2020-03-21: 20 mL

## 2020-03-21 MED ORDER — GENTAMICIN SULFATE 40 MG/ML IJ SOLN
INTRAMUSCULAR | Status: AC
  Filled 2020-03-21: qty 2

## 2020-03-21 MED ORDER — ONDANSETRON HCL 4 MG/2ML IJ SOLN: 4.0000 mg | Freq: Four times a day (QID) | INTRAMUSCULAR | Status: DC | PRN

## 2020-03-21 MED ORDER — PROPOFOL 10 MG/ML IV BOLUS
INTRAVENOUS | Status: AC
  Filled 2020-03-21: qty 20

## 2020-03-21 MED ORDER — VITAMIN B-12 1000 MCG PO TABS
1000.0000 ug | ORAL_TABLET | Freq: Every day | ORAL | Status: DC
  Administered 2020-03-22: 1000 ug via ORAL
  Filled 2020-03-21: qty 1

## 2020-03-21 MED ORDER — CEFAZOLIN SODIUM-DEXTROSE 2-4 GM/100ML-% IV SOLN
2.0000 g | INTRAVENOUS | Status: AC
  Administered 2020-03-21: 2 g via INTRAVENOUS

## 2020-03-21 SURGICAL SUPPLY — 110 items
APPLIER CLIP 11 MED OPEN (CLIP)
APPLIER CLIP 9.375 SM OPEN (CLIP)
BINDER BREAST LRG (GAUZE/BANDAGES/DRESSINGS) IMPLANT
BINDER BREAST MEDIUM (GAUZE/BANDAGES/DRESSINGS) IMPLANT
BINDER BREAST XLRG (GAUZE/BANDAGES/DRESSINGS) IMPLANT
BINDER BREAST XXLRG (GAUZE/BANDAGES/DRESSINGS) IMPLANT
BIOPATCH WHT 1IN DISK W/4.0 H (GAUZE/BANDAGES/DRESSINGS) ×3 IMPLANT
BLADE BOVIE TIP EXT 4 (BLADE) ×3 IMPLANT
BLADE SURG 15 STRL LF DISP TIS (BLADE) ×4 IMPLANT
BLADE SURG 15 STRL SS (BLADE) ×2
BNDG ELASTIC 6X5.8 VLCR NS LF (GAUZE/BANDAGES/DRESSINGS) ×6 IMPLANT
BULB RESERV EVAC DRAIN JP 100C (MISCELLANEOUS) ×3 IMPLANT
CANISTER SUCT 1200ML W/VALVE (MISCELLANEOUS) ×6 IMPLANT
CHLORAPREP W/TINT 26 (MISCELLANEOUS) ×3 IMPLANT
CLIP APPLIE 11 MED OPEN (CLIP) IMPLANT
CLIP APPLIE 9.375 SM OPEN (CLIP) IMPLANT
CNTNR SPEC 2.5X3XGRAD LEK (MISCELLANEOUS)
CONT SPEC 4OZ STER OR WHT (MISCELLANEOUS)
CONTAINER SPEC 2.5X3XGRAD LEK (MISCELLANEOUS) IMPLANT
COVER WAND RF STERILE (DRAPES) IMPLANT
DECANTER SPIKE VIAL GLASS SM (MISCELLANEOUS) IMPLANT
DERMABOND ADVANCED (GAUZE/BANDAGES/DRESSINGS)
DERMABOND ADVANCED .7 DNX12 (GAUZE/BANDAGES/DRESSINGS) IMPLANT
DEVICE DUBIN SPECIMEN MAMMOGRA (MISCELLANEOUS) IMPLANT
DRAIN CHANNEL JP 15F RND 16 (MISCELLANEOUS) ×3 IMPLANT
DRAPE 3/4 80X56 (DRAPES) ×3 IMPLANT
DRAPE CHEST BREAST 77X106 FENE (MISCELLANEOUS) ×3 IMPLANT
DRAPE LAPAROTOMY 100X77 ABD (DRAPES) IMPLANT
DRAPE LAPAROTOMY 77X122 PED (DRAPES) ×3 IMPLANT
DRAPE UTILITY 15X26 TOWEL STRL (DRAPES) ×6 IMPLANT
DRESSING PRVNA BELLAFORM 21X19 (GAUZE/BANDAGES/DRESSINGS) ×2 IMPLANT
DRSG PREVENA BELLAFORM 21X19 (GAUZE/BANDAGES/DRESSINGS) ×3
DRSG TEGADERM 4X10 (GAUZE/BANDAGES/DRESSINGS) IMPLANT
DRSG TEGADERM 4X4.75 (GAUZE/BANDAGES/DRESSINGS) ×3 IMPLANT
DRSG TELFA 3X8 NADH (GAUZE/BANDAGES/DRESSINGS) IMPLANT
ELECT CAUTERY BLADE TIP 2.5 (TIP) ×3
ELECT REM PT RETURN 9FT ADLT (ELECTROSURGICAL) ×3
ELECTRODE CAUTERY BLDE TIP 2.5 (TIP) ×2 IMPLANT
ELECTRODE REM PT RTRN 9FT ADLT (ELECTROSURGICAL) ×2 IMPLANT
EXPANDER BREAST 275CC (Breast) ×3 IMPLANT
GAUZE SPONGE 4X4 12PLY STRL (GAUZE/BANDAGES/DRESSINGS) IMPLANT
GLOVE BIO SURGEON STRL SZ 6.5 (GLOVE) ×12 IMPLANT
GLOVE BIOGEL M STRL SZ7.5 (GLOVE) ×6 IMPLANT
GLOVE BIOGEL PI IND STRL 6.5 (GLOVE) ×4 IMPLANT
GLOVE BIOGEL PI IND STRL 8 (GLOVE) ×2 IMPLANT
GLOVE BIOGEL PI INDICATOR 6.5 (GLOVE) ×2
GLOVE BIOGEL PI INDICATOR 8 (GLOVE) ×1
GOWN STRL REUS W/ TWL LRG LVL3 (GOWN DISPOSABLE) ×10 IMPLANT
GOWN STRL REUS W/TWL LRG LVL3 (GOWN DISPOSABLE) ×5
GRAFT FLEX HD  17.5X20X0.7-1.4 (Tissue Mesh) ×1 IMPLANT
GRAFT FLEX HD 17.5X20X0.7-1.4 (Tissue Mesh) ×2 IMPLANT
IV NS 1000ML (IV SOLUTION)
IV NS 1000ML BAXH (IV SOLUTION) IMPLANT
IV NS 500ML (IV SOLUTION) ×1
IV NS 500ML BAXH (IV SOLUTION) ×2 IMPLANT
JACKSON PRATT 10 (INSTRUMENTS) IMPLANT
KIT TURNOVER KIT A (KITS) ×6 IMPLANT
LABEL OR SOLS (LABEL) ×3 IMPLANT
LIGHT WAVEGUIDE WIDE FLAT (MISCELLANEOUS) IMPLANT
MARGIN MAP 10MM (MISCELLANEOUS) ×3 IMPLANT
NEEDLE 21 GA WING INFUSION (NEEDLE) ×3 IMPLANT
NEEDLE FILTER BLUNT 18X 1/2SAF (NEEDLE) ×1
NEEDLE FILTER BLUNT 18X1 1/2 (NEEDLE) ×2 IMPLANT
NEEDLE HYPO 22GX1.5 SAFETY (NEEDLE) ×3 IMPLANT
NS IRRIG 1000ML POUR BTL (IV SOLUTION) IMPLANT
PACK BASIN MAJOR ARMC (MISCELLANEOUS) ×6 IMPLANT
PACK SPY-PHI (KITS) ×3 IMPLANT
PAD ABD DERMACEA PRESS 5X9 (GAUZE/BANDAGES/DRESSINGS) IMPLANT
PAD PREP 24X41 OB/GYN DISP (PERSONAL CARE ITEMS) IMPLANT
PIN SAFETY STRL (MISCELLANEOUS) IMPLANT
SET ASEPTIC TRANSFER (MISCELLANEOUS) ×3 IMPLANT
SET LOCALIZER 20 PROBE US (MISCELLANEOUS) ×3 IMPLANT
SLEVE PROBE SENORX GAMMA FIND (MISCELLANEOUS) ×3 IMPLANT
SOL PREP PVP 2OZ (MISCELLANEOUS)
SOLUTION PREP PVP 2OZ (MISCELLANEOUS) IMPLANT
SPONGE LAP 18X18 RF (DISPOSABLE) ×6 IMPLANT
STAPLER SKIN PROX 35W (STAPLE) IMPLANT
STRIP CLOSURE SKIN 1/2X4 (GAUZE/BANDAGES/DRESSINGS) IMPLANT
SUT CHROMIC 4 0 PS 2 18 (SUTURE) IMPLANT
SUT DVC VLOC 90 3-0 CV23 UNDY (SUTURE) ×3 IMPLANT
SUT ETHILON 2 0 FS 18 (SUTURE) ×3 IMPLANT
SUT ETHILON 3-0 FS-10 30 BLK (SUTURE) ×3
SUT MNCRL 4-0 (SUTURE)
SUT MNCRL 4-0 27XMFL (SUTURE)
SUT MNCRL AB 4-0 PS2 18 (SUTURE) IMPLANT
SUT PDS II 3-0 (SUTURE) ×6 IMPLANT
SUT PDS PLUS 0 (SUTURE)
SUT PDS PLUS 2 (SUTURE) ×4
SUT PDS PLUS AB 0 CT-2 (SUTURE) IMPLANT
SUT PDS PLUS AB 2-0 CT-1 (SUTURE) ×8 IMPLANT
SUT SILK 2 0 SH (SUTURE) IMPLANT
SUT V-LOC 90 ABS 3-0 VLT  V-20 (SUTURE) ×1
SUT V-LOC 90 ABS 3-0 VLT V-20 (SUTURE) ×2 IMPLANT
SUT VIC AB 3-0 54X BRD REEL (SUTURE) ×2 IMPLANT
SUT VIC AB 3-0 BRD 54 (SUTURE) ×1
SUT VIC AB 3-0 SH 27 (SUTURE) ×1
SUT VIC AB 3-0 SH 27X BRD (SUTURE) ×2 IMPLANT
SUT VIC AB 3-0 SH 8-18 (SUTURE) ×3 IMPLANT
SUT VICRYL 0 27 CT2 27 ABS (SUTURE) IMPLANT
SUT VICRYL 4-0  27 PS-2 BARIAT (SUTURE)
SUT VICRYL 4-0 27 PS-2 BARIAT (SUTURE)
SUTURE EHLN 3-0 FS-10 30 BLK (SUTURE) ×2 IMPLANT
SUTURE MNCRL 4-0 27XMF (SUTURE) IMPLANT
SUTURE VICRYL 4-0 27 PS-2 BART (SUTURE) IMPLANT
SWABSTK COMLB BENZOIN TINCTURE (MISCELLANEOUS) IMPLANT
SYR 10ML LL (SYRINGE) ×3 IMPLANT
SYR 20ML LL LF (SYRINGE) ×3 IMPLANT
SYR BULB IRRIG 60ML STRL (SYRINGE) ×3 IMPLANT
TRAY FOLEY SLVR 16FR LF STAT (SET/KITS/TRAYS/PACK) IMPLANT
WATER STERILE IRR 1000ML POUR (IV SOLUTION) IMPLANT

## 2020-03-21 NOTE — Anesthesia Postprocedure Evaluation (Signed)
Anesthesia Post Note  Patient: Heather Bell  Procedure(s) Performed: TOTAL MASTECTOMY (Right Breast) RIGHT BREAST RECONSTRUCTION WITH PLACEMENT OF TISSUE EXPANDER AND FLEX HD (ACELLULAR HYDRATED DERMIS) (Right )  Patient location during evaluation: PACU Anesthesia Type: General Level of consciousness: awake and alert Pain management: pain level controlled Vital Signs Assessment: post-procedure vital signs reviewed and stable Respiratory status: spontaneous breathing, nonlabored ventilation, respiratory function stable and patient connected to nasal cannula oxygen Cardiovascular status: blood pressure returned to baseline and stable Postop Assessment: no apparent nausea or vomiting Anesthetic complications: no   No complications documented.   Last Vitals:  Vitals:   03/21/20 1500 03/21/20 1502  BP: 115/73 115/73  Pulse: (!) 110 (!) 101  Resp: 19 15  Temp:    SpO2: 98% 97%    Last Pain:  Vitals:   03/21/20 1502  TempSrc:   PainSc: 5                  Arita Miss

## 2020-03-21 NOTE — Brief Op Note (Signed)
03/21/2020  2:26 PM  PATIENT:  Heather Bell  44 y.o. female  PRE-OPERATIVE DIAGNOSIS:  C50.911, s77.3 Breast cancer metastisized to axillary lymph node, rt  POST-OPERATIVE DIAGNOSIS:  right breast cancer with metastasis to lymph nodes  PROCEDURE:  Procedure(s): TOTAL MASTECTOMY (Right) RIGHT BREAST RECONSTRUCTION WITH PLACEMENT OF TISSUE EXPANDER AND FLEX HD (ACELLULAR HYDRATED DERMIS) (Right)  SURGEON:  Surgeon(s) and Role: Panel 1:    * Herbert Pun, MD - Primary Panel 2:    * [name], MD - Primary  PHYSICIAN ASSISTANT: Software engineer, PA  ASSISTANTS: none   ANESTHESIA:   general  EBL:  100 mL   BLOOD ADMINISTERED:none  DRAINS: Penrose drain in the chest   LOCAL MEDICATIONS USED:  NONE  SPECIMEN:  Source of Specimen:  Mastectomy flap skin  DISPOSITION OF SPECIMEN:  PATHOLOGY  COUNTS:  YES  TOURNIQUET:  * No tourniquets in log *  DICTATION: .Dragon Dictation  PLAN OF CARE: Admit for overnight observation  PATIENT DISPOSITION:  PACU - hemodynamically stable.   Delay start of Pharmacological VTE agent (>24hrs) due to surgical blood loss or risk of bleeding: not applicable

## 2020-03-21 NOTE — Op Note (Signed)
Operative Note   DATE OF OPERATION: 03/21/2020  SURGICAL DEPARTMENT: Plastic Surgery  PREOPERATIVE DIAGNOSES: Right mastectomy defect  POSTOPERATIVE DIAGNOSES:  same  PROCEDURE: 1.  Right breast reconstruction with tissue expanders and acellular dermal matrix 2.  Indocyanine green angiography of the skin flaps 3.  Incisional wound VAC application to the right chest.  SURGEON: Talmadge Coventry, MD  ASSISTANT: Verdie Shire, PA The advanced practice practitioner (APP) assisted throughout the case.  The APP was essential in retraction and counter traction when needed to make the case progress smoothly.  This retraction and assistance made it possible to see the tissue plans for the procedure.  The assistance was needed for blood control, tissue re-approximation and assisted with closure of the incision site.  ANESTHESIA:  General.   COMPLICATIONS: None.   INDICATIONS FOR PROCEDURE:  The patient, Heather Bell is a 44 y.o. female born on Apr 21, 1976, is here for treatment of right mastectomy defect for cancer. MRN: [id number]  CONSENT:  Informed consent was obtained directly from the patient. Risks, benefits and alternatives were fully discussed. Specific risks including but not limited to bleeding, infection, hematoma, seroma, scarring, pain, contracture, asymmetry, wound healing problems, and need for further surgery were all discussed. The patient did have an ample opportunity to have questions answered to satisfaction.   DESCRIPTION OF PROCEDURE:  The patient was taken to the operating room. SCDs were placed and antibiotics were given.  General anesthesia was administered.  The patient's operative site was prepped and draped in a sterile fashion. A time out was performed and all information was confirmed to be correct.  Dr. Windell Moment performed a right-sided mastectomy and then turned the patient over to me.  I started by measuring the width of the chest wall and selected a 275 cc  tissue expander.  I used the Mentor CPX medium height expander.  The serial number is [id number].  I also brought a piece of Flex HD pliable pre onto the field and soaked in injectable saline.  I started by preparing the expander.  A PDS pursestring was drawn around the periphery of the acellular matrix.  The expander was deflated completely and placed in the center and the pursestring was tied down.  Suture tabs were brought out at 3 6 and 9:00 to prepare for insertion.  At this point I used indocyanine green angiography to evaluate the skin flaps.  There were a few dark areas around the periphery and this was excised with an additional ellipse of skin taken around the incision completely.  The specimen was sent to pathology.  After this the only questionable areas were the inferior flap where it was a bit thin with a few areas of exposed dermis.  The pectoralis muscle was thinning out out and some of the inferior attachments were violated so submuscular coverage was not possible in the inferior aspect.  I elected to proceed with prepectoral placement.  I achieved meticulous hemostasis.  I placed a 15 French drain and secured this with a 3-0 nylon suture.  I then measured out where the 3 6 and 9:00 suture tabs would ideally be located and placed PDS sutures in each of these locations in the chest wall.  These were then passed through the suture tabs to the expander.  I irrigated the pocket copiously with triple antibiotic solution.  The expander was then placed and the sutures were tied down securing it into position.  At this point I elected to place several dermal interrupted 3-0  PDS sutures and the skin flaps to close.  There was minimal tension on this closure.  I did use the indocyanine green angiography again and being that the inferior flap still looked dark in a few areas that were too large to be excised I did not inflate the expander at all.  The remainder the skin closure was done with interrupted  buried 3-0 PDS sutures and a running 3 oh V-Loc suture.  At this point the [facility] wound VAC was applied and an excellent seal was obtained.  A Biopatch and Tegaderm were placed around the drain.  Her chest was then wrapped in an Ace wrap.  The patient tolerated the procedure well.  There were no complications. The patient was allowed to wake from anesthesia, extubated and taken to the recovery room in satisfactory condition.

## 2020-03-21 NOTE — Interval H&P Note (Signed)
History and Physical Interval Note:  03/21/2020 9:23 AM  Heather Bell  has presented today for surgery, with the diagnosis of C50.911, s77.3 Breast cancer metastisized to axillary lymph node, rt.  The various methods of treatment have been discussed with the patient and family. After consideration of risks, benefits and other options for treatment, the patient has consented to  Procedure(s): TOTAL MASTECTOMY (Right) WITH SENTINEL LYMPH NODE VS COMPLETE AXILLARY NODE DISSECTION RIGHT BREAST RECONSTRUCTION WITH PLACEMENT OF TISSUE EXPANDER AND FLEX HD (ACELLULAR HYDRATED DERMIS) (Right) as a surgical intervention.  The patient's history has been reviewed, patient examined, no change in status, stable for surgery.  I have reviewed the patient's chart and labs.  Questions were answered to the patient's satisfaction.     Herbert Pun

## 2020-03-21 NOTE — Progress Notes (Signed)
   03/21/20 0755  Clinical Encounter Type  Visited With Family  Visit Type Initial  Referral From Chaplain  Consult/Referral To Chaplain  [facility] rounding SDS waiting area, chaplain spoke with patient's family member, telling them that she wanted to make sure they were alright as they waited.

## 2020-03-21 NOTE — Anesthesia Preprocedure Evaluation (Signed)
Anesthesia Evaluation  Patient identified by MRN, date of birth, ID band Patient awake    Reviewed: Allergy & Precautions, H&P , NPO status , Patient's Chart, lab work & pertinent test results, reviewed documented beta blocker date and time   Airway Mallampati: II  TM Distance: >3 FB Neck ROM: full    Dental  (+) Teeth Intact   Pulmonary neg pulmonary ROS,    Pulmonary exam normal        Cardiovascular Exercise Tolerance: Good negative cardio ROS Normal cardiovascular exam Rhythm:regular Rate:Normal     Neuro/Psych PSYCHIATRIC DISORDERS Anxiety Depression Bipolar Disorder negative neurological ROS     GI/Hepatic Neg liver ROS, GERD  Medicated,  Endo/Other  negative endocrine ROSdiabetes, Well Controlled, Type 2, Oral Hypoglycemic Agents  Renal/GU negative Renal ROS  negative genitourinary   Musculoskeletal   Abdominal   Peds  Hematology  (+) Blood dyscrasia, anemia ,   Anesthesia Other Findings Past Medical History: No date: Allergic rhinitis No date: Anxiety No date: Bipolar disorder ([place])     Comment:  depression No date: Cancer [facility])     Comment:  breast right No date: Depression No date: Diabetes (HCC) No date: GERD (gastroesophageal reflux disease) No date: Hyperlipidemia Past Surgical History: 07/22/2019: BREAST BIOPSY; Right     Comment:  mass bx, path pending, heart marker 07/22/2019: BREAST BIOPSY; Right     Comment:  LN bx, path pending,  butterfly hydromarker No date: IMAGE GUIDED SINUS SURGERY No date: KNEE SURGERY No date: NASAL SEPTUM SURGERY 08/07/2019: PORTACATH PLACEMENT; N/A     Comment:  Procedure: INSERTION PORT-A-CATH;  Surgeon:               Herbert Pun, MD;  Location: [facility] ORS;  Service:              General;  Laterality: N/A; No date: TONSILLECTOMY   Reproductive/Obstetrics negative OB ROS                             Anesthesia  Physical Anesthesia Plan  ASA: III  Anesthesia Plan: General ETT   Post-op Pain Management:    Induction:   PONV Risk Score and Plan: 4 or greater  Airway Management Planned:   Additional Equipment:   Intra-op Plan:   Post-operative Plan:   Informed Consent: I have reviewed the patients History and Physical, chart, labs and discussed the procedure including the risks, benefits and alternatives for the proposed anesthesia with the patient or authorized representative who has indicated his/her understanding and acceptance.     Dental Advisory Given  Plan Discussed with: CRNA  Anesthesia Plan Comments:         Anesthesia Quick Evaluation

## 2020-03-21 NOTE — Transfer of Care (Signed)
Immediate Anesthesia Transfer of Care Note  Patient: Heather Bell  Procedure(s) Performed: TOTAL MASTECTOMY (Right Breast) RIGHT BREAST RECONSTRUCTION WITH PLACEMENT OF TISSUE EXPANDER AND FLEX HD (ACELLULAR HYDRATED DERMIS) (Right )  Patient Location: PACU  Anesthesia Type:General  Level of Consciousness: drowsy and patient cooperative  Airway & Oxygen Therapy: Patient Spontanous Breathing  Post-op Assessment: Report given to RN and Post -op Vital signs reviewed and stable  Post vital signs: Reviewed and stable  Last Vitals:  Vitals Value Taken Time  BP 103/64 03/21/20 1401  Temp    Pulse 103 03/21/20 1403  Resp 15 03/21/20 1403  SpO2 98 % 03/21/20 1403  Vitals shown include unvalidated device data.  Last Pain:  Vitals:   03/21/20 0920  TempSrc: Oral  PainSc: 0-No pain         Complications: No complications documented.

## 2020-03-21 NOTE — Op Note (Signed)
Preoperative diagnosis: Invasive mammary Carcinoma of the right breast.  Postoperative diagnosis: Invasive mammary Carcinoma of the right breast..   Procedure: Right total mastectomy.                      Sentinel Lymph node biopsy  Anesthesia: GETA  Surgeon: Dr. Windell Moment  Wound Classification: Clean  Indications: Patient is a 44 y.o. female who had an abnormal mammogram that on workup with core needle biopsy was found to be invasive mammary carcinoma. Due to pre op right axillary lymph node with metastatic invasive mammary carcinoma, she completed neoadjuvant chemotherapy. After discussion of alternatives, the patient elected total (simple) mastectomy with sentinel lymph node vs complete axillary node dissection.  Findings: 1. No palpable grossly abnormal axillary lymph node identified. 2. The pre operated positive lymph node showed chemotherapy changes but no gross metastasis  Description of procedure: The patient was brought to the operating room and general anesthesia was induced. A time-out was completed verifying correct patient, procedure, site, positioning, and implant(s) and/or special equipment prior to beginning this procedure. I personally injected methylene blue dye for intra opeartive mapping of axillary lymph nodes. The breast, chest wall, axilla, and upper arm and neck were prepped and draped in the usual sterile fashion.  A skin incision was made that encompassed the nipple-areola complex and the previous biopsy scar and passed in an oblique direction across the breast. Flaps were raised in the avascular plane between subcutaneous tissue and breast tissue from the clavicle superiorly, the sternum medially, the anterior rectus sheath inferiorly, and past the lateral border of the pectoralis major muscle laterally. Hemostasis was achieved in the flaps. Next, the breast tissue and underlying pectoralis fascia were excised from the pectoralis major muscle, progressing from  medially to laterally. At the lateral border of the pectoralis major muscle, the breast tissue was swung laterally and a lateral pedicle identified where breast tissue gave way to fat of axilla. The lateral pedicle was incised and the specimen removed.  The wound was irrigated and hemostasis was achieved.  The pre operative lymph node that was positive to metastatic disease was identified with pre operative localization of radiofrequency tag which was different than the sentinel lymph node. With a hand-held gamma probe was used to identify the location of the hottest spot in the axilla. Dissection was carried down until subdermal facias was advanced. The probe was placed and again, the point of maximal count was found. Dissection continue until nodule was identified. The probe was placed in contact with the node. The node was excised in its entirety. No additional hot spots were identified. No clinically abnormal nodes were palpated. Pathology reported frozen section were negative for grossly metastatic disease. The procedure was terminated. Hemostasis was achieved.  Incision of the left breast was open for the second procedure, Immediate reconstruction by Plastic and Reconstructive surgery.   Specimen: Right Breast                    Sentinel lymph nodes.   Complications: None  Estimated Blood Loss: 25 mL

## 2020-03-21 NOTE — Anesthesia Procedure Notes (Signed)
Procedure Name: Intubation Performed by: Isador Castille L, CRNA Pre-anesthesia Checklist: Patient identified, Patient being monitored, Timeout performed, Emergency Drugs available and Suction available Patient Re-evaluated:Patient Re-evaluated prior to induction Oxygen Delivery Method: Circle system utilized Preoxygenation: Pre-oxygenation with 100% oxygen Induction Type: IV induction Ventilation: Mask ventilation without difficulty Laryngoscope Size: Mac and 3 Grade View: Grade I Tube type: Oral Tube size: 7.0 mm Number of attempts: 1 Airway Equipment and Method: Stylet Placement Confirmation: ETT inserted through vocal cords under direct vision,  positive ETCO2 and breath sounds checked- equal and bilateral Secured at: 21 cm Tube secured with: Tape Dental Injury: Teeth and Oropharynx as per pre-operative assessment        

## 2020-03-22 ENCOUNTER — Encounter: Payer: Self-pay | Admitting: General Surgery

## 2020-03-22 MED ORDER — GABAPENTIN 300 MG PO CAPS
300.0000 mg | ORAL_CAPSULE | Freq: Three times a day (TID) | ORAL | 0 refills | Status: DC
Start: 2020-03-22 — End: 2020-09-20

## 2020-03-22 MED ORDER — OXYCODONE-ACETAMINOPHEN 7.5-325 MG PO TABS: 1.0000 | ORAL_TABLET | ORAL | 0 refills | Status: DC | PRN

## 2020-03-22 MED ORDER — CYCLOBENZAPRINE HCL 5 MG PO TABS: 5.0000 mg | ORAL_TABLET | Freq: Three times a day (TID) | ORAL | 0 refills | Status: DC | PRN

## 2020-03-22 NOTE — Progress Notes (Signed)
Heather Bell to be D/C'd home with husband per MD order.  Discussed prescriptions and follow up appointments with the patient. Prescriptions given to patient, medication list explained in detail. Pt verbalized understanding.  Allergies as of 03/22/2020       Reactions   Compazine [prochlorperazine Edisylate] Other (See Comments)   Acute dystonic reaction   Morphine And Related Shortness Of Breath   Doxycycline Other (See Comments)   "made all my symptoms worse"   Fluticasone Other (See Comments)   Doesn't work   Levofloxacin Other (See Comments)   "made all my symptoms worse"        Medication List     TAKE these medications    amphetamine-dextroamphetamine 25 MG 24 hr capsule Commonly known as: ADDERALL XR Take 25 mg by mouth daily.   Calcium 600+D 600-800 MG-UNIT Tabs Generic drug: Calcium Carb-Cholecalciferol Take 1 tablet by mouth daily.   cetirizine 10 MG tablet Commonly known as: ZYRTEC Take 10 mg by mouth daily.   cyclobenzaprine 5 MG tablet Commonly known as: FLEXERIL Take 1 tablet (5 mg total) by mouth 3 (three) times daily as needed for muscle spasms.   dexamethasone 4 MG tablet Commonly known as: DECADRON Take 2 tablets by mouth once a day on the day after chemotherapy then 2 tablets two times a day for 2 days.  Take with food.   doxepin 25 MG capsule Commonly known as: SINEQUAN Take 25-50 mg by mouth at bedtime.   gabapentin 300 MG capsule Commonly known as: Neurontin Take 1 capsule (300 mg total) by mouth 3 (three) times daily for 20 days.   ibuprofen 200 MG tablet Commonly known as: ADVIL Take 400-800 mg by mouth every 6 (six) hours as needed for headache or moderate pain.   lamoTRIgine 200 MG tablet Commonly known as: LAMICTAL Take 200 mg by mouth at bedtime.   lidocaine-prilocaine cream Commonly known as: EMLA Apply topically over port-a-cath 30 minutes to 1 hour prior to treatment.   LORazepam 0.5 MG tablet Commonly known as: ATIVAN Take  0.5 mg by mouth every 8 (eight) hours as needed (nausea).   Lurasidone HCl 60 MG Tabs Take 60 mg by mouth at bedtime.   lurasidone 40 MG Tabs tablet Commonly known as: LATUDA Take 40 mg by mouth at bedtime.   metFORMIN 500 MG 24 hr tablet Commonly known as: GLUCOPHAGE-XR Take 1,000 mg by mouth 2 (two) times daily.   mometasone 50 MCG/ACT nasal spray Commonly known as: NASONEX Place 2 sprays into the nose daily as needed (allergies).   multivitamin with minerals Tabs tablet Take 1 tablet by mouth daily.   omeprazole 20 MG capsule Commonly known as: PRILOSEC Take 20 mg by mouth daily before breakfast.   ondansetron 8 MG tablet Commonly known as: Zofran Take 1 tablet (8 mg total) by mouth 2 (two) times daily as needed for refractory nausea / vomiting. Start on day 3 after carboplatin chemo.   oxyCODONE-acetaminophen 7.5-325 MG tablet Commonly known as: Percocet Take 1 tablet by mouth every 4 (four) hours as needed for severe pain.   potassium chloride 10 MEQ tablet Commonly known as: KLOR-CON Take 1 tablet (10 mEq total) by mouth daily.   QUEtiapine 300 MG tablet Commonly known as: SEROQUEL Take 300 mg by mouth at bedtime.   simvastatin 20 MG tablet Commonly known as: ZOCOR Take 20 mg by mouth daily.   topiramate 100 MG tablet Commonly known as: TOPAMAX Take 50-100 mg by mouth See admin instructions.  Take 1 tablet (100 mg) by mouth in the morning & take 0.5 tablet (50 mg) by mouth at night.   vitamin B-12 1000 MCG tablet Commonly known as: CYANOCOBALAMIN Take 1,000 mcg by mouth daily.        Vitals:   03/22/20 0527 03/22/20 1231  BP: (!) 93/55 91/63  Pulse: 95 93  Resp: 18 20  Temp: 98.9 F (37.2 C)   SpO2: 98% 99%    Skin clean, dry and intact without evidence of skin break down, no evidence of skin tears noted. IV catheter discontinued intact. Site without signs and symptoms of complications. Dressing and pressure applied. Pt denies pain at this time.  No complaints noted.  An After Visit Summary was printed and given to the patient. Patient escorted via [place], and D/C home via private auto.  Keota A Sabryna Lahm

## 2020-03-22 NOTE — Discharge Summary (Signed)
Patient ID: ZETTIE GOOTEE MRN: [id number] DOB/AGE: 04-16-1976 44 y.o.  Admit date: 03/21/2020 Discharge date: 03/22/2020   Discharge Diagnoses:  Active Problems:   Breast cancer [facility])   Procedures: Right total mastectomy, sentinel lymph node biopsy with immediate reconstruction  Hospital Course: Patient with right breast cancer. She underwent right total mastectomy with sentinel lymph node biopsy and immediate reconstruction. Patient this morning recovering adequately. Pain controlled. Adequate drain output. Negative pressure dressing in place.   Physical Exam Cardiovascular:     Rate and Rhythm: Normal rate and regular rhythm.     Pulses: Normal pulses.     Heart sounds: Normal heart sounds.  Pulmonary:     Effort: Pulmonary effort is normal.  Chest:       Comments: Negative pressure dressing in place.  Abdominal:     General: Abdomen is flat.     Palpations: Abdomen is soft.  Musculoskeletal:     Cervical back: Normal range of motion.  Neurological:     Mental Status: She is alert.      Consults: None  Disposition: Discharge disposition: 01-Home or Self Care       Discharge Instructions    Diet - low sodium heart healthy   Complete by: As directed      Allergies as of 03/22/2020      Reactions   Compazine [prochlorperazine Edisylate] Other (See Comments)   Acute dystonic reaction   Morphine And Related Shortness Of Breath   Doxycycline Other (See Comments)   "made all my symptoms worse"   Fluticasone Other (See Comments)   Doesn't work   Levofloxacin Other (See Comments)   "made all my symptoms worse"      Medication List    TAKE these medications   amphetamine-dextroamphetamine 25 MG 24 hr capsule Commonly known as: ADDERALL XR Take 25 mg by mouth daily.   Calcium 600+D 600-800 MG-UNIT Tabs Generic drug: Calcium Carb-Cholecalciferol Take 1 tablet by mouth daily.   cetirizine 10 MG tablet Commonly known as: ZYRTEC Take 10 mg by mouth daily.    cyclobenzaprine 5 MG tablet Commonly known as: FLEXERIL Take 1 tablet (5 mg total) by mouth 3 (three) times daily as needed for muscle spasms.   dexamethasone 4 MG tablet Commonly known as: DECADRON Take 2 tablets by mouth once a day on the day after chemotherapy then 2 tablets two times a day for 2 days.  Take with food.   doxepin 25 MG capsule Commonly known as: SINEQUAN Take 25-50 mg by mouth at bedtime.   gabapentin 300 MG capsule Commonly known as: Neurontin Take 1 capsule (300 mg total) by mouth 3 (three) times daily for 20 days.   ibuprofen 200 MG tablet Commonly known as: ADVIL Take 400-800 mg by mouth every 6 (six) hours as needed for headache or moderate pain.   lamoTRIgine 200 MG tablet Commonly known as: LAMICTAL Take 200 mg by mouth at bedtime.   lidocaine-prilocaine cream Commonly known as: EMLA Apply topically over port-a-cath 30 minutes to 1 hour prior to treatment.   LORazepam 0.5 MG tablet Commonly known as: ATIVAN Take 0.5 mg by mouth every 8 (eight) hours as needed (nausea).   Lurasidone HCl 60 MG Tabs Take 60 mg by mouth at bedtime.   lurasidone 40 MG Tabs tablet Commonly known as: LATUDA Take 40 mg by mouth at bedtime.   metFORMIN 500 MG 24 hr tablet Commonly known as: GLUCOPHAGE-XR Take 1,000 mg by mouth 2 (two) times daily.  mometasone 50 MCG/ACT nasal spray Commonly known as: NASONEX Place 2 sprays into the nose daily as needed (allergies).   multivitamin with minerals Tabs tablet Take 1 tablet by mouth daily.   omeprazole 20 MG capsule Commonly known as: PRILOSEC Take 20 mg by mouth daily before breakfast.   ondansetron 8 MG tablet Commonly known as: Zofran Take 1 tablet (8 mg total) by mouth 2 (two) times daily as needed for refractory nausea / vomiting. Start on day 3 after carboplatin chemo.   oxyCODONE-acetaminophen 7.5-325 MG tablet Commonly known as: Percocet Take 1 tablet by mouth every 4 (four) hours as needed for severe  pain.   potassium chloride 10 MEQ tablet Commonly known as: KLOR-CON Take 1 tablet (10 mEq total) by mouth daily.   QUEtiapine 300 MG tablet Commonly known as: SEROQUEL Take 300 mg by mouth at bedtime.   simvastatin 20 MG tablet Commonly known as: ZOCOR Take 20 mg by mouth daily.   topiramate 100 MG tablet Commonly known as: TOPAMAX Take 50-100 mg by mouth See admin instructions. Take 1 tablet (100 mg) by mouth in the morning & take 0.5 tablet (50 mg) by mouth at night.   vitamin B-12 1000 MCG tablet Commonly known as: CYANOCOBALAMIN Take 1,000 mcg by mouth daily.       Follow-up Information    Herbert Pun, MD Follow up in 2 week(s).   Specialty: General Surgery Contact information: [address] [place] [address] [phone]

## 2020-03-22 NOTE — Discharge Instructions (Signed)
  Diet: Resume home heart healthy regular diet.   Activity: No heavy lifting >20 pounds (children, pets, laundry, garbage) or strenuous activity until follow-up, but light activity and walking are encouraged. Do not drive or drink alcohol if taking narcotic pain medications.  Wound care: Keep negative pressure dressing in place. May take bath with baby wipes. Do not take showers or submerge under water until instructed by Dr. Claudia Desanctis.    Medications: Resume all home medications. For mild to moderate pain: acetaminophen (Tylenol) or ibuprofen (if no kidney disease). Combining Tylenol with alcohol can substantially increase your risk of causing liver disease. Narcotic pain medications, if prescribed, can be used for severe pain, though may cause nausea, constipation, and drowsiness. Do not combine Tylenol and Percocet within a 6 hour period as Percocet contains Tylenol. If you do not need the narcotic pain medication, you do not need to fill the prescription.  Call office [phone]) at any time if any questions, worsening pain, fevers/chills, bleeding, drainage from incision site, or other concerns.

## 2020-03-23 ENCOUNTER — Other Ambulatory Visit: Payer: Self-pay | Admitting: Anatomic Pathology & Clinical Pathology

## 2020-03-23 LAB — SURGICAL PATHOLOGY

## 2020-03-24 ENCOUNTER — Encounter: Payer: Self-pay | Admitting: General Surgery

## 2020-03-28 ENCOUNTER — Ambulatory Visit (INDEPENDENT_AMBULATORY_CARE_PROVIDER_SITE_OTHER): Payer: BC Managed Care – PPO | Admitting: Plastic Surgery

## 2020-03-28 ENCOUNTER — Other Ambulatory Visit: Payer: Self-pay

## 2020-03-28 ENCOUNTER — Encounter: Payer: Self-pay | Admitting: Plastic Surgery

## 2020-03-28 VITALS — BP 98/63 | HR 102 | Temp 98.8°F

## 2020-03-28 DIAGNOSIS — C773 Secondary and unspecified malignant neoplasm of axilla and upper limb lymph nodes: Secondary | ICD-10-CM

## 2020-03-28 DIAGNOSIS — C50911 Malignant neoplasm of unspecified site of right female breast: Secondary | ICD-10-CM

## 2020-03-28 MED ORDER — OXYCODONE-ACETAMINOPHEN 7.5-325 MG PO TABS: 1.0000 | ORAL_TABLET | ORAL | 0 refills | Status: DC | PRN

## 2020-03-28 MED ORDER — SULFAMETHOXAZOLE-TRIMETHOPRIM 800-160 MG PO TABS: 1.0000 | ORAL_TABLET | Freq: Two times a day (BID) | ORAL | 0 refills | Status: DC

## 2020-03-28 NOTE — Progress Notes (Signed)
Patient presents 1 week out from right mastectomy and reconstruction.  She overall feels like she is doing well but has had quite a bit of pain on the right side when she moves a certain direction.  This is being controlled with 7.5 mg Percocets.  She has had the incisional wound VAC on for the past week and her drains have been putting out around 50 cc a day.  I remove the VAC today revealing an intact incision with quite a bit of bruising of the inferior mastectomy flap.  This was the flap that had perfusion concerns intraoperatively based on the indocyanine green angiography.  I put a completely deflated expander in place.  Inferior submuscular coverage was not possible due to some of the inferior attachments of the pectoralis being violated.  I explained to her that at this point all he can be done is watchful waiting of the skin to see if it is able to heal.  I would not plan to expand her anytime soon.  I will plan to check her again next week and see how she is coming along.  We will plan to leave the drain in place.  I am going to refill her Percocet and her Bactrim.  I did explain that if the skin is unable to heal then she may lose the expander.

## 2020-04-04 NOTE — Progress Notes (Signed)
[facility]  [address] [place], [place] [address] Phone: [phone]  Fax: [phone]   Clinic Day:  04/05/2020  Referring physician: [place], [place]  Chief Complaint: Heather Bell is a 44 y.o. 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  HPI: The patient was last seen in the medical oncology clinic on 03/08/2020. At that time,  she was doing well.  Fluid intake remained poor.  She wanted to continue IV fluids. Hematocrit was 32.0, hemoglobin 10.4, MCV 97.3, platelets 305,000, WBC 7,200. Calcium was 8.8.  We discussed plans for surgery.  She received IVF on 03/08/2020, 03/09/2020 and 03/11/2020.  She underwent right total mastectomy and reconstruction on 03/21/2020 by Dr. Windell Moment and Dr. Claudia Desanctis.  Breast pathology was negative for carcinoma. There was duct ectasia and stromal fibrosis.  Sentinel lymph node and an additional lymph node were negative for carcinoma. Post treatment classification was ypT0N0.  During the interim, she has been "okay I guess." She had a lot of pain after surgery and was treated with Percocet, Gabapentin, and Flexeril.  Now, she is still taking Gabapentin and 2-4 Percocet daily. Her blood sugars have been good (88 this morning). She is seeing her general surgeon today and the plastic surgeon tomorrow.  The patient is still not drinking a lot of fluids. She spends most of her time sitting on the couch. She is able to perform ADLs on her own but cannot shower yet because she still has bandages on and the drain in.   The patient takes oral Vitamin B12. She tried to take oral folate but it made her sick. She stopped taking potassium about a week ago.  The patient agrees to 3 month follow ups for the first year going forward. She will call if she feels that she needs IV fluids.   Past Medical  History:  Diagnosis Date  . Allergic rhinitis   . Anxiety   . Bipolar disorder ([place])    depression  . Cancer [facility])    breast right  . Depression   . Diabetes ([place])   . GERD (gastroesophageal reflux disease)   . Hyperlipidemia     Past Surgical History:  Procedure Laterality Date  . BREAST BIOPSY Right 07/22/2019   mass bx, path pending, heart marker  . BREAST BIOPSY Right 07/22/2019   LN bx, path pending,  butterfly hydromarker  . BREAST RECONSTRUCTION WITH PLACEMENT OF TISSUE EXPANDER AND FLEX HD (ACELLULAR HYDRATED DERMIS) Right 03/21/2020   Procedure: RIGHT BREAST RECONSTRUCTION WITH PLACEMENT OF TISSUE EXPANDER AND FLEX HD (ACELLULAR HYDRATED DERMIS);  Surgeon: [name], MD;  Location: [facility] ORS;  Service: Plastics;  Laterality: Right;  . IMAGE GUIDED SINUS SURGERY    . KNEE SURGERY    . NASAL SEPTUM SURGERY    . PORTACATH PLACEMENT N/A 08/07/2019   Procedure: INSERTION PORT-A-CATH;  Surgeon: Herbert Pun, MD;  Location: [facility] ORS;  Service: General;  Laterality: N/A;  . TONSILLECTOMY    . TOTAL MASTECTOMY Right 03/21/2020   Procedure: TOTAL MASTECTOMY;  Surgeon: Herbert Pun, MD;  Location: [facility] ORS;  Service: General;  Laterality: Right;    Family History  Problem Relation Age of Onset  . Lung cancer Father   . Heart attack Father   . Lung cancer Paternal Uncle   . Heart disease Paternal Uncle   . Lung cancer Paternal Aunt  Social History:  reports that she has never smoked. She has never used smokeless tobacco. She reports that she does not drink alcohol and does not use drugs. Deniesany exposure to radiation or toxins.She has 2 children (56 year old daughter and 96 year old son).She use to work inthefast foodindustry. She is currently not working. Her husbandisMichael.Michaelused totravel a lot for his job.Her husband found a new job closer to home in [place], [place]. The patient is alone today.   Allergies:  Allergies  Allergen  Reactions  . Compazine [Prochlorperazine Edisylate] Other (See Comments)    Acute dystonic reaction  . Morphine And Related Shortness Of Breath  . Doxycycline Other (See Comments)    "made all my symptoms worse"   . Fluticasone Other (See Comments)    Doesn't work  . Levofloxacin Other (See Comments)    "made all my symptoms worse"     Current Medications: Current Outpatient Medications  Medication Sig Dispense Refill  . amphetamine-dextroamphetamine (ADDERALL XR) 25 MG 24 hr capsule Take 25 mg by mouth daily.    . Calcium Carb-Cholecalciferol (CALCIUM 600+D) 600-800 MG-UNIT TABS Take 1 tablet by mouth daily.    . cyclobenzaprine (FLEXERIL) 5 MG tablet Take 1 tablet (5 mg total) by mouth 3 (three) times daily as needed for muscle spasms. 30 tablet 0  . doxepin (SINEQUAN) 25 MG capsule Take 25-50 mg by mouth at bedtime.     . gabapentin (NEURONTIN) 300 MG capsule Take 1 capsule (300 mg total) by mouth 3 (three) times daily for 20 days. 60 capsule 0  . ibuprofen (ADVIL) 200 MG tablet Take 400-800 mg by mouth every 6 (six) hours as needed for headache or moderate pain.    Marland Kitchen lamoTRIgine (LAMICTAL) 200 MG tablet Take 200 mg by mouth at bedtime.     . lidocaine-prilocaine (EMLA) cream Apply topically over port-a-cath 30 minutes to 1 hour prior to treatment. 30 g 1  . LORazepam (ATIVAN) 0.5 MG tablet Take 0.5 mg by mouth every 8 (eight) hours as needed (nausea).    . lurasidone (LATUDA) 40 MG TABS tablet Take 40 mg by mouth at bedtime.     . Lurasidone HCl 60 MG TABS Take 60 mg by mouth at bedtime.     . metFORMIN (GLUCOPHAGE-XR) 500 MG 24 hr tablet Take 1,000 mg by mouth 2 (two) times daily.    . mometasone (NASONEX) 50 MCG/ACT nasal spray Place 2 sprays into the nose daily as needed (allergies).     . Multiple Vitamin (MULTIVITAMIN WITH MINERALS) TABS tablet Take 1 tablet by mouth daily.    Marland Kitchen omeprazole (PRILOSEC) 20 MG capsule Take 20 mg by mouth daily before breakfast.     .  oxyCODONE-acetaminophen (PERCOCET) 7.5-325 MG tablet Take 1 tablet by mouth every 4 (four) hours as needed for severe pain. 30 tablet 0  . potassium chloride (KLOR-CON) 10 MEQ tablet Take 1 tablet (10 mEq total) by mouth daily. 20 tablet 0  . QUEtiapine (SEROQUEL) 300 MG tablet Take 300 mg by mouth at bedtime.    . simvastatin (ZOCOR) 20 MG tablet Take 20 mg by mouth daily.    Marland Kitchen sulfamethoxazole-trimethoprim (BACTRIM DS) 800-160 MG tablet Take 1 tablet by mouth 2 (two) times daily. 28 tablet 0  . topiramate (TOPAMAX) 100 MG tablet Take 50-100 mg by mouth See admin instructions. Take 1 tablet (100 mg) by mouth in the morning & take 0.5 tablet (50 mg) by mouth at night.    . vitamin B-12 (  CYANOCOBALAMIN) 1000 MCG tablet Take 1,000 mcg by mouth daily.    . cetirizine (ZYRTEC) 10 MG tablet Take 10 mg by mouth daily.  (Patient not taking: Reported on 04/05/2020)    . dexamethasone (DECADRON) 4 MG tablet Take 2 tablets by mouth once a day on the day after chemotherapy then 2 tablets two times a day for 2 days.  Take with food. (Patient not taking: Reported on 04/05/2020) 20 tablet 1  . ondansetron (ZOFRAN) 8 MG tablet Take 1 tablet (8 mg total) by mouth 2 (two) times daily as needed for refractory nausea / vomiting. Start on day 3 after carboplatin chemo. (Patient not taking: Reported on 04/05/2020) 30 tablet 1  . oxyCODONE-acetaminophen (PERCOCET) 7.5-325 MG tablet Take 1 tablet by mouth every 4 (four) hours as needed for severe pain. 20 tablet 0   No current facility-administered medications for this visit.   Facility-Administered Medications Ordered in Other Visits  Medication Dose Route Frequency Provider Last Rate Last Admin  . 0.9 %  sodium chloride infusion   Intravenous Continuous Lequita Asal, MD   Stopped at 01/08/20 1516  . sodium chloride flush (NS) 0.9 % injection 10 mL  10 mL Intravenous PRN Nolon Stalls C, MD   10 mL at 01/08/20 1314    Review of Systems  Constitutional:  Negative for chills, diaphoresis, fever, malaise/fatigue and weight loss (up 4 lbs).       Feels "better."  HENT: Negative for congestion, ear discharge, ear pain, hearing loss, nosebleeds, sinus pain, sore throat and tinnitus.   Eyes: Negative.  Negative for blurred vision, double vision and photophobia.  Respiratory: Negative for cough, hemoptysis, sputum production and shortness of breath.   Cardiovascular: Negative.  Negative for chest pain, palpitations, orthopnea and leg swelling.  Gastrointestinal: Negative for abdominal pain, blood in stool (hemorrhoids), constipation, diarrhea, heartburn (on antacids), melena, nausea (occasional, some today) and vomiting.       Eating well. Struggles to drink enough fluids.  Genitourinary: Negative for dysuria, frequency, hematuria and urgency.  Musculoskeletal: Negative for back pain, joint pain, myalgias and neck pain.  Skin: Negative for itching and rash.  Neurological: Negative for dizziness, tingling, sensory change, weakness and headaches.  Endo/Heme/Allergies: Does not bruise/bleed easily.       Type II diabetes.   Psychiatric/Behavioral: Negative for depression and memory loss. The patient is not nervous/anxious and does not have insomnia.   All other systems reviewed and are negative.  Performance status (ECOG):  1  Vitals Blood pressure 97/65, pulse 89, temperature 97.8 F (36.6 C), temperature source Tympanic, resp. rate 18, height _0  (1.676 m), weight 147 lb 2.5 oz (66.7 kg), SpO2 100 %.   Physical Exam Vitals and nursing note reviewed.  Constitutional:      General: She is not in acute distress.    Appearance: She is well-developed. She is not diaphoretic.  HENT:     Head: Normocephalic and atraumatic.     Comments: Blue scarf.    Mouth/Throat:     Mouth: Mucous membranes are moist.     Pharynx: Oropharynx is clear.  Eyes:     General: No scleral icterus.    Extraocular Movements: Extraocular movements intact.      Conjunctiva/sclera: Conjunctivae normal.     Pupils: Pupils are equal, round, and reactive to light.     Comments: Brown eyes.  Cardiovascular:     Rate and Rhythm: Normal rate and regular rhythm.     Heart sounds: Normal  heart sounds. No murmur heard.   Pulmonary:     Effort: Pulmonary effort is normal. No respiratory distress.     Breath sounds: Normal breath sounds. No stridor. No wheezing or rales.  Chest:     Chest wall: No tenderness.     Comments: ACE wrap in place around chest.  Drain in place. Abdominal:     General: Bowel sounds are normal. There is no distension.     Palpations: Abdomen is soft. There is no hepatomegaly, splenomegaly or mass.     Tenderness: There is no abdominal tenderness. There is no guarding or rebound.  Musculoskeletal:        General: No swelling or tenderness. Normal range of motion.     Cervical back: Normal range of motion and neck supple.  Lymphadenopathy:     Head:     Right side of head: No preauricular, posterior auricular or occipital adenopathy.     Left side of head: No preauricular, posterior auricular or occipital adenopathy.     Cervical: No cervical adenopathy.     Upper Body:     Right upper body: No supraclavicular or axillary adenopathy.     Left upper body: No supraclavicular or axillary adenopathy.     Lower Body: No right inguinal adenopathy. No left inguinal adenopathy.  Skin:    General: Skin is warm and dry.  Neurological:     Mental Status: She is alert and oriented to person, place, and time.  Psychiatric:        Mood and Affect: Mood normal.        Behavior: Behavior normal.        Thought Content: Thought content normal.        Judgment: Judgment normal.    Infusion on 04/05/2020  Component Date Value Ref Range Status  . WBC 04/05/2020 7.1  4.0 - 10.5 K/uL Final  . RBC 04/05/2020 3.02* 3.87 - 5.11 MIL/uL Final  . Hemoglobin 04/05/2020 9.5* 12.0 - 15.0 g/dL Final  . HCT 04/05/2020 29.2* 36 - 46 % Final  . MCV  04/05/2020 96.7  80.0 - 100.0 fL Final  . MCH 04/05/2020 31.5  26.0 - 34.0 pg Final  . MCHC 04/05/2020 32.5  30.0 - 36.0 g/dL Final  . RDW 04/05/2020 12.5  11.5 - 15.5 % Final  . Platelets 04/05/2020 346  150 - 400 K/uL Final  . nRBC 04/05/2020 0.0  0.0 - 0.2 % Final  . Neutrophils Relative % 04/05/2020 50  % Final  . Neutro Abs 04/05/2020 3.6  1.7 - 7.7 K/uL Final  . Lymphocytes Relative 04/05/2020 22  % Final  . Lymphs Abs 04/05/2020 1.6  0.7 - 4.0 K/uL Final  . Monocytes Relative 04/05/2020 6  % Final  . Monocytes Absolute 04/05/2020 0.4  0 - 1 K/uL Final  . Eosinophils Relative 04/05/2020 21  % Final  . Eosinophils Absolute 04/05/2020 1.5* 0 - 0 K/uL Final  . Basophils Relative 04/05/2020 1  % Final  . Basophils Absolute 04/05/2020 0.1  0 - 0 K/uL Final  . Immature Granulocytes 04/05/2020 0  % Final  . Abs Immature Granulocytes 04/05/2020 0.02  0.00 - 0.07 K/uL Final   Performed at [facility], [address], [place], [place] [address]    Assessment:  Heather Bell is a 44 y.o. female with clinical T1cN1triple negativerightbreast cancers/p neoadjuvant chemotherapy followed by right total mastectomy, sentinel lymph node biopsy, and reconstruction on 03/21/2020.  Breast pathology was  negative for carcinoma. There was duct ectasia and stromal fibrosis.  Sentinel lymph node and an additional lymph node were negative for carcinoma. Post treatment classification was ypT0N0.  She initially underwent ultrasound guided core biopsy on 07/22/2019. Pathology revealeda grade IIIinvasive mammary carcinoma, no special type of the right breast. Specimen was 10 mm.Ductal carcinoma in situ(DCIS)was not identified.Right axillarylymph nodewas positive for metastatic carcinoma.Tumor was ER - (<1%), PR - (<1%), and Her2/neu -.  Bilateral mammogram and right unilateral ultrasoundon 07/10/2019 revealed a 1.7 x 1.6 x 1.6 cmmass at the7 o'clock region of the right breast 7 cm  from the nipple. There was an abnormal1.4 x 0.7 x 0.9 cmaxillary lymph node. Therewas a second3 mmaxillary lymph node with minimal focal cortical thickening.An ultrasound-guided core biopsies of the mass and the enlarged right axillary lymph node was recommended. BI-RADS category 5.  PET scanon 08/11/2019 showed a hypermetabolic mass in the inferior RIGHT breast consistentwith herbreast carcinoma. There was noevidence hypermetabolic nodal metastasis.There was no distant metastatic disease.There was nopulmonary metastasis or skeletal metastasis.  E[place]oon 12/24/2020revealedan ejection fraction of50 to 55%. The left ventricle demonstrated global hypokinesis.MUGAon 08/24/2019 revealed an EF of 60%.MUGAon 12/08/2019 revealed an EF of57.4%.  MUGA on 01/22/2020 revealed an EF of 56.5%.  She is followed by Dr End.  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  Shereceived 12 weeksneoadjuvant Taxol + carboplatin(08/25/2019 -11/24/2019) and 4 cycles of AC (12/15/2019 - 02/16/2020) with Fulphila (pegfilgrastim-jmdb) support.  She has required IVF support.  CA27.29 has been followed: 8.5 on 07/30/2019 and 8.7 on 04/05/2020.  She is premenopausal. She stopped taking birth control pills on 08/17/2019.  She has iron deficiency. Ferritin was 21 with an iron saturation of 7% and a TIBC 343 on 09/08/2019.She received Venoferweekly x 3 (09/18/2019 - 10/06/2019).  She has B12 deficiency. B12 was 240 on 01/19/2021and 680 on 11/24/2019.She is on oral B12.  Folate was 62.7 on 09/08/2019.  She has recurrent orthostatic hypotension. Fluid intake is marginal. AM cortisol was 5.3 (low) on 09/22/2019 suggestive of adrenal insufficiency.ACTH stim testwas negative.  Cortisol was 9.0 on 12/24/2019.   Symptomatically, she is doing well.  She is recovering from surgery. She  continues to drink a small amount of fluids.  Plan: 1.    Labs today:  CBC with diff, CMP, CA27.29. 2. Clinical stageIIB triple negativeright breast cancer  Patient is s/p neoadjuvant chemotherapy. She completed Taxol + carboplatin x 12 and 4 cycles of AC (last 02/16/2020).  She is s/p right total mastectomy and sentinel lymph node biopsy.  Pathology was reviewed in detail with the patient.  She has a complete pathologic remission.  No additional chemotherapy needed.  Discuss plans for follow-up: every 3 months for the first year, every 4 months for the 2nd year, every 6 months for the 3rd - 5th year then annually 3. Normocytic anemia Hematocrit 29.2, hemoglobin9.5, MCV96.7 today. Ferritin was 89 on 11/24/2019. Suspect part of anemia due to interval surgery.  Continue to monitor. 4. B12 deficiency Patient is on oral B12.  B12 was 240 on 01/19/2021and 680 on 11/24/2019. Folate was 62.7 on 09/08/2019.  Monitor annually 5.Chronic dehydration Cortisol level was 9.0 (normal) on 12/24/2019.  Patient notes her decreased fluid intake has not improved.  Encourage fluids.  Patient to contact clinic if IV fluids needed. 6.   Hypokalemia  Potassium 3.5.  Etiology secondary to carboplatin.  She has been off potassium 1 week. 7.  No IVF today. 8.   RTC every 6-12 weeks for port-a-cath flush. 9.   RTC in 3 months for MD assessment and labs (CBC with diff, CMP, CA27.29) and port flush.  I discussed the assessment and treatment plan with the patient.  The patient was provided an opportunity to ask questions and all were answered.  The patient agreed with the plan and demonstrated an understanding of the instructions.  The patient was advised to call back if the symptoms worsen or if the condition fails to improve as anticipated.    I provided 13 minutes of face-to-face time during this  this encounter and > 50% was spent counseling as documented under my assessment and plan. An additional 10 minutes were spent reviewing her chart (Epic and Care Everywhere) including notes, labs, and imaging studies.    Lequita Asal, MD, PhD    04/05/2020, 10:29 AM  I, Mirian Mo Tufford, am acting as a Education administrator for [employer]. Mike Gip, MD.   I, Piera Downs C. Mike Gip, MD, have reviewed the above documentation for accuracy and completeness, and I agree with the above.

## 2020-04-05 ENCOUNTER — Encounter: Payer: Self-pay | Admitting: Hematology and Oncology

## 2020-04-05 ENCOUNTER — Inpatient Hospital Stay: Payer: BC Managed Care – PPO | Attending: Hematology and Oncology

## 2020-04-05 ENCOUNTER — Other Ambulatory Visit: Payer: Self-pay

## 2020-04-05 ENCOUNTER — Inpatient Hospital Stay (HOSPITAL_BASED_OUTPATIENT_CLINIC_OR_DEPARTMENT_OTHER): Payer: BC Managed Care – PPO | Admitting: Hematology and Oncology

## 2020-04-05 ENCOUNTER — Other Ambulatory Visit: Payer: Self-pay | Admitting: Hematology and Oncology

## 2020-04-05 VITALS — BP 97/65 | HR 89 | Temp 97.8°F | Resp 18 | Ht 66.0 in | Wt 147.2 lb

## 2020-04-05 DIAGNOSIS — D649 Anemia, unspecified: Secondary | ICD-10-CM | POA: Insufficient documentation

## 2020-04-05 DIAGNOSIS — E538 Deficiency of other specified B group vitamins: Secondary | ICD-10-CM | POA: Diagnosis not present

## 2020-04-05 DIAGNOSIS — F419 Anxiety disorder, unspecified: Secondary | ICD-10-CM | POA: Insufficient documentation

## 2020-04-05 DIAGNOSIS — K219 Gastro-esophageal reflux disease without esophagitis: Secondary | ICD-10-CM | POA: Insufficient documentation

## 2020-04-05 DIAGNOSIS — C50511 Malignant neoplasm of lower-outer quadrant of right female breast: Secondary | ICD-10-CM

## 2020-04-05 DIAGNOSIS — E785 Hyperlipidemia, unspecified: Secondary | ICD-10-CM | POA: Diagnosis not present

## 2020-04-05 DIAGNOSIS — I951 Orthostatic hypotension: Secondary | ICD-10-CM | POA: Insufficient documentation

## 2020-04-05 DIAGNOSIS — F319 Bipolar disorder, unspecified: Secondary | ICD-10-CM | POA: Insufficient documentation

## 2020-04-05 DIAGNOSIS — Z79899 Other long term (current) drug therapy: Secondary | ICD-10-CM | POA: Diagnosis not present

## 2020-04-05 DIAGNOSIS — Z7984 Long term (current) use of oral hypoglycemic drugs: Secondary | ICD-10-CM | POA: Diagnosis not present

## 2020-04-05 DIAGNOSIS — Z7951 Long term (current) use of inhaled steroids: Secondary | ICD-10-CM | POA: Insufficient documentation

## 2020-04-05 DIAGNOSIS — Z9221 Personal history of antineoplastic chemotherapy: Secondary | ICD-10-CM | POA: Insufficient documentation

## 2020-04-05 DIAGNOSIS — E119 Type 2 diabetes mellitus without complications: Secondary | ICD-10-CM | POA: Diagnosis not present

## 2020-04-05 DIAGNOSIS — Z171 Estrogen receptor negative status [ER-]: Secondary | ICD-10-CM

## 2020-04-05 DIAGNOSIS — E876 Hypokalemia: Secondary | ICD-10-CM | POA: Diagnosis not present

## 2020-04-05 DIAGNOSIS — Z853 Personal history of malignant neoplasm of breast: Secondary | ICD-10-CM | POA: Insufficient documentation

## 2020-04-05 DIAGNOSIS — Z95828 Presence of other vascular implants and grafts: Secondary | ICD-10-CM

## 2020-04-05 DIAGNOSIS — Z7952 Long term (current) use of systemic steroids: Secondary | ICD-10-CM | POA: Insufficient documentation

## 2020-04-05 LAB — CBC WITH DIFFERENTIAL/PLATELET
Abs Immature Granulocytes: 0.02 10*3/uL (ref 0.00–0.07)
Basophils Absolute: 0.1 10*3/uL (ref 0.0–0.1)
Basophils Relative: 1 %
Eosinophils Absolute: 1.5 10*3/uL — ABNORMAL HIGH (ref 0.0–0.5)
Eosinophils Relative: 21 %
HCT: 29.2 % — ABNORMAL LOW (ref 36.0–46.0)
Hemoglobin: 9.5 g/dL — ABNORMAL LOW (ref 12.0–15.0)
Immature Granulocytes: 0 %
Lymphocytes Relative: 22 %
Lymphs Abs: 1.6 10*3/uL (ref 0.7–4.0)
MCH: 31.5 pg (ref 26.0–34.0)
MCHC: 32.5 g/dL (ref 30.0–36.0)
MCV: 96.7 fL (ref 80.0–100.0)
Monocytes Absolute: 0.4 10*3/uL (ref 0.1–1.0)
Monocytes Relative: 6 %
Neutro Abs: 3.6 10*3/uL (ref 1.7–7.7)
Neutrophils Relative %: 50 %
Platelets: 346 10*3/uL (ref 150–400)
RBC: 3.02 MIL/uL — ABNORMAL LOW (ref 3.87–5.11)
RDW: 12.5 % (ref 11.5–15.5)
WBC: 7.1 10*3/uL (ref 4.0–10.5)
nRBC: 0 % (ref 0.0–0.2)

## 2020-04-05 LAB — COMPREHENSIVE METABOLIC PANEL
ALT: 17 U/L (ref 0–44)
AST: 21 U/L (ref 15–41)
Albumin: 3.5 g/dL (ref 3.5–5.0)
Alkaline Phosphatase: 54 U/L (ref 38–126)
Anion gap: 8 (ref 5–15)
BUN: 11 mg/dL (ref 6–20)
CO2: 22 mmol/L (ref 22–32)
Calcium: 8.6 mg/dL — ABNORMAL LOW (ref 8.9–10.3)
Chloride: 106 mmol/L (ref 98–111)
Creatinine, Ser: 0.73 mg/dL (ref 0.44–1.00)
GFR calc Af Amer: >60 mL/min (ref >60)
GFR calc non Af Amer: >60 mL/min (ref >60)
Glucose, Bld: 135 mg/dL — ABNORMAL HIGH (ref 70–99)
Potassium: 3.5 mmol/L (ref 3.5–5.1)
Sodium: 136 mmol/L (ref 135–145)
Total Bilirubin: 0.2 mg/dL — ABNORMAL LOW (ref 0.3–1.2)
Total Protein: 6.5 g/dL (ref 6.5–8.1)

## 2020-04-05 MED ORDER — HEPARIN SOD (PORK) LOCK FLUSH 100 UNIT/ML IV SOLN
500.0000 [IU] | Freq: Once | INTRAVENOUS | Status: AC
  Administered 2020-04-05: 500 [IU] via INTRAVENOUS
  Filled 2020-04-05: qty 5

## 2020-04-05 MED ORDER — SODIUM CHLORIDE 0.9% FLUSH
10.0000 mL | INTRAVENOUS | Status: DC | PRN
  Administered 2020-04-05: 10 mL via INTRAVENOUS
  Filled 2020-04-05: qty 10

## 2020-04-05 NOTE — Progress Notes (Signed)
The patient c/o right breast pain ( 3) due to surgery.

## 2020-04-06 ENCOUNTER — Telehealth: Payer: Self-pay

## 2020-04-06 ENCOUNTER — Ambulatory Visit (INDEPENDENT_AMBULATORY_CARE_PROVIDER_SITE_OTHER): Payer: BC Managed Care – PPO | Admitting: Surgical

## 2020-04-06 ENCOUNTER — Encounter: Payer: Self-pay | Admitting: Surgical

## 2020-04-06 VITALS — BP 95/55 | HR 107 | Temp 99.0°F

## 2020-04-06 DIAGNOSIS — C773 Secondary and unspecified malignant neoplasm of axilla and upper limb lymph nodes: Secondary | ICD-10-CM

## 2020-04-06 DIAGNOSIS — C50911 Malignant neoplasm of unspecified site of right female breast: Secondary | ICD-10-CM

## 2020-04-06 LAB — CANCER ANTIGEN 27.29: CA 27.29: 8.7 U/mL (ref 0.0–38.6)

## 2020-04-06 NOTE — Telephone Encounter (Signed)
Faxed Prism order for Heather Bell: 4x4 guaze and xeroform daily

## 2020-04-06 NOTE — Progress Notes (Signed)
Patient is a 44 year old female here for follow-up after right mastectomy with reconstruction by Dr. Claudia Desanctis on 03/21/2020.  At her last evaluation on 03/28/2020 there was some bruising along the inferior mastectomy flap.  Intraoperatively patient had indocyanine green angiography which showed some perfusion concerns at this location.   Patient reports drainage into JP drain over the last 5 days has been approximately 20 cc/day.  She has a Mepilex border dressing over the right breast at this time.  She reports she has noticed some improvement in range of motion, but is still having some difficulties.  Chaperone present on exam On exam right breast mastectomy incision is intact, blue dye noted over incision.  She does have some epithelium sloughing near the medial aspect of the inferior flap.  It is not full-thickness and there is no is exposed expander at this time.  There is no periwound erythema.  There is no cellulitic changes.  No foul odor noted.   The wound along the right breast is approximately 3 x 4 cm.  Right JP drain in place with approximately 10 cc of serosanguineous drainage in bulb.    We did not fill today due to the already known compromised perfusion.  I did remove the Mepilex border dressing and instructed patient to apply Xeroform gauze daily.  We will order supplies for her via prism. No sign of infection, seroma, hematoma. Recommend following up in 1 week for reevaluation, we may be able to fill at that time. Continue wearing compressive devices to prevent any additional swelling or formation of a seroma.  May be able to begin some light range of motion exercises, but continue to avoid any heavy lifting.  Pictures were obtained of the patient and placed in the chart with the patient's or guardian's permission.

## 2020-04-07 ENCOUNTER — Telehealth: Payer: Self-pay | Admitting: Surgical

## 2020-04-07 NOTE — Telephone Encounter (Signed)
Patient said she forgot to ask if she could take a shower now. Please call her back to advise.

## 2020-04-07 NOTE — Telephone Encounter (Signed)
Spoke with patient, she can shower. Avoid scrubbing wound. Allow water to run over breast wound. Reapply dressing after.

## 2020-04-13 ENCOUNTER — Other Ambulatory Visit: Payer: Self-pay

## 2020-04-13 ENCOUNTER — Ambulatory Visit (INDEPENDENT_AMBULATORY_CARE_PROVIDER_SITE_OTHER): Payer: BC Managed Care – PPO | Admitting: Surgical

## 2020-04-13 ENCOUNTER — Encounter: Payer: Self-pay | Admitting: Surgical

## 2020-04-13 VITALS — BP 103/65 | HR 114 | Temp 98.5°F

## 2020-04-13 DIAGNOSIS — C773 Secondary and unspecified malignant neoplasm of axilla and upper limb lymph nodes: Secondary | ICD-10-CM

## 2020-04-13 DIAGNOSIS — C50911 Malignant neoplasm of unspecified site of right female breast: Secondary | ICD-10-CM

## 2020-04-13 NOTE — Progress Notes (Signed)
   Subjective:     Patient ID: Heather Bell, female    DOB: Apr 09, 1976, 44 y.o.   MRN: [id number]  Chief Complaint  Patient presents with  . Follow-up     HPI: The patient is a 44 y.o. female here for follow-up on her right breast reconstruction.  Patient underwent right mastectomy by general surgery followed by reconstruction by Dr. Claudia Desanctis on 03/21/2020 for right breast cancer.  Patient has been applying Xeroform gauze daily to right breast wound and covering with 4 x 4 gauze.  She is approximately 3 weeks postop.  At her last evaluation patient had loss of epithelium without any signs of full-thickness loss.  We have not filled patient's expander due to the increased risk of flap necrosis.  Patient currently has 0 cc of fluid in her right breast expander.   Review of Systems  Constitutional: Negative for chills and fever.  Gastrointestinal: Negative for nausea and vomiting.  Skin: Positive for wound.    Objective:   Vital Signs BP 103/65 (BP Location: Left Arm, Patient Position: Sitting, Cuff Size: Normal)   Pulse (!) 114   Temp 98.5 F (36.9 C) (Oral)   SpO2 97%  Vital Signs and Nursing Note Reviewed Chaperone present Physical Exam Constitutional:      General: She is not in acute distress.    Appearance: Normal appearance. She is not toxic-appearing.  HENT:     Head: Normocephalic and atraumatic.  Pulmonary:     Effort: Pulmonary effort is normal.  Chest:       Comments: 1. Right breast mastectomy incision without any dehiscence, blue dye is covering the central portion of the incision. She does have progression of the depth of right inferior mastectomy flap wound.  She has some new epithelialization at the edges but has developed some necrosis and eschar formation.  No foul odor noted.  No fluid expressed with palpation.  No crepitus noted.  No cellulitic changes.  No erythema.  Neurological:     General: No focal deficit present.     Mental Status: She is alert and  oriented to person, place, and time.  Psychiatric:        Mood and Affect: Mood normal.        Behavior: Behavior normal.     Assessment/Plan:     ICD-10-CM   1. Breast cancer metastasized to axillary lymph node, right (HCC)  C50.911    C77.3     Recommend continuing with Xeroform dressing changes daily, recommend applying Vaseline in addition to help soften up eschar formation.  I did discuss with the patient that if the wound progresses and the expander becomes exposed she would require surgical intervention to remove the expander.  I also discussed with the patient that we will hold off on filling with any injectable saline to prevent increased pressure on the wound.   Will have patient follow-up in 1 week for reevaluation, discussed reasons to call us prior to next week including visualization of the expander or worsening of the wound depth/appearance.  Pictures were obtained of the patient and placed in the chart with the patient's or guardian's permission.    Carola Rhine Yesmin Mutch, PA-C 04/13/2020, 10:32 AM

## 2020-04-21 ENCOUNTER — Other Ambulatory Visit: Payer: Self-pay

## 2020-04-21 ENCOUNTER — Encounter: Payer: Self-pay | Admitting: Surgical

## 2020-04-21 ENCOUNTER — Ambulatory Visit (INDEPENDENT_AMBULATORY_CARE_PROVIDER_SITE_OTHER): Payer: BC Managed Care – PPO | Admitting: Surgical

## 2020-04-21 VITALS — BP 102/66 | HR 87 | Temp 98.1°F

## 2020-04-21 DIAGNOSIS — C773 Secondary and unspecified malignant neoplasm of axilla and upper limb lymph nodes: Secondary | ICD-10-CM

## 2020-04-21 DIAGNOSIS — C50911 Malignant neoplasm of unspecified site of right female breast: Secondary | ICD-10-CM

## 2020-04-21 NOTE — Progress Notes (Signed)
Patient is a 44 year old female here for follow-up after right breast reconstruction with placement of tissue expander.  Patient was last seen 1 week ago.  She currently has 0 cc in her right breast expander, this is to decrease any additional compromise of the inferior mastectomy flap.  She has been applying Vaseline and Xeroform daily to right breast wound.  She reports that she has noticed some increased pain along the medial right breast.  She is otherwise doing well, no fevers, chills, nausea, vomiting, chest pain, shortness of breath.  Chaperone present on exam On exam right breast wound improved with some yellow fibrinous exudate noted within the wound bed but the black eschar has resolved.  Some of the blue dye is being reabsorbed and there is no sign of necrosis at this time.  No incisional dehiscence noted.  No abnormalities felt on exam.  Recommend continuing with Vaseline, Xeroform daily. Patient may try to stop wearing sports bra at night, recommend continuing wear during the day.  If she notices any increased pain or swelling after not wearing sports bra at night, recommend beginning to wear again. Recommend following up in 2 weeks for reevaluation, if she notices any changes or has any questions or concerns prior to then please call. I discussed with her that we should continue to hold off on any refills at this time as any increased pressure on the wound may increase the chance of it opening and that would require additional surgical intervention to remove the expander.  Pictures were obtained of the patient and placed in the chart with the patient's or guardian's permission.

## 2020-04-26 ENCOUNTER — Telehealth: Payer: Self-pay | Admitting: Plastic Surgery

## 2020-04-26 NOTE — Telephone Encounter (Signed)
Patient called back to advise that there is a small spot at the very edge of the wound and that is where the blood is coming to the surface of the skin but it is not actively bleeding.

## 2020-04-26 NOTE — Telephone Encounter (Signed)
Returned patients call. She woke up this morning with some light colored blood size of a dime on her gauze. She is not actively bleeding. No fever, chills, diarrhea or vomiting. Per Marlene [place], ok to use Vaseline with gauze. Call with any changes. Patient understood and agreed.

## 2020-04-26 NOTE — Telephone Encounter (Signed)
Patient called to say that she had blood on the gauze this morning and wanted to know if she should come in or if this is normal. She said it was about a dime size. It went through the gauze but not the pad she uses to cover the gauze. It's not bleeding today. Please call to advise.

## 2020-04-27 ENCOUNTER — Other Ambulatory Visit: Payer: Self-pay

## 2020-04-27 ENCOUNTER — Encounter: Payer: Self-pay | Admitting: Plastic Surgery

## 2020-04-27 ENCOUNTER — Ambulatory Visit (INDEPENDENT_AMBULATORY_CARE_PROVIDER_SITE_OTHER): Payer: BC Managed Care – PPO | Admitting: Surgical

## 2020-04-27 VITALS — BP 103/70 | HR 102 | Temp 98.3°F

## 2020-04-27 DIAGNOSIS — S21001D Unspecified open wound of right breast, subsequent encounter: Secondary | ICD-10-CM

## 2020-04-27 DIAGNOSIS — C50911 Malignant neoplasm of unspecified site of right female breast: Secondary | ICD-10-CM

## 2020-04-27 DIAGNOSIS — C773 Secondary and unspecified malignant neoplasm of axilla and upper limb lymph nodes: Secondary | ICD-10-CM

## 2020-04-27 NOTE — Progress Notes (Signed)
Patient is a 44 year old female here for follow-up after right mastectomy followed by placement of tissue expander on 03/21/2020.  Patient reports that she noticed some drainage from her right breast and is here for evaluation  Chaperone present on exam On exam right breast wound with 1.5 x 2 cm wound, this has progressed since previous exam on 04/21/2020.  There is fibrinous exudate within the wound bed, blue dye has sloughed off and exposed new area of sloughing tissue.  Drainage noted from the most medial aspect of the wound along with the superior portion (serosanguinous).  No foul odor noted.  Positive fluid wave noted with palpation.  Serosanguineous drainage noted on gauze.  No cellulitic changes or erythema noted.  I was able to aspirate 20 cc of serous fluid from right breast using a sterile technique, being careful to not puncture the expander, patient tolerated this fine.  Dr. Claudia Desanctis was present during today's evaluation and had a discussion with patient in regards to the right breast wound.  Discussed with patient that due to the depth of the wound, it is likely that the expander will need to be removed, we can wait a little bit longer to see if she has any improvement.  Patient would like to think this over and will call us with her decision.  Will discuss with surgical scheduler to begin planning for removal pending patient would like to proceed recommend calling with any questions or concerns.  We have a follow-up scheduled for next week.  Pictures were obtained of the patient and placed in the chart with the patient's or guardian's permission.

## 2020-04-29 ENCOUNTER — Other Ambulatory Visit
Admission: RE | Admit: 2020-04-29 | Discharge: 2020-04-29 | Disposition: A | Discharge status: Home / Self Care | Payer: BC Managed Care – PPO | Source: Ambulatory Visit | Attending: Plastic Surgery | Admitting: Plastic Surgery

## 2020-04-29 ENCOUNTER — Other Ambulatory Visit: Payer: Self-pay

## 2020-04-29 ENCOUNTER — Encounter (HOSPITAL_BASED_OUTPATIENT_CLINIC_OR_DEPARTMENT_OTHER): Payer: Self-pay | Admitting: Plastic Surgery

## 2020-04-29 DIAGNOSIS — Z20822 Contact with and (suspected) exposure to covid-19: Secondary | ICD-10-CM | POA: Insufficient documentation

## 2020-04-29 DIAGNOSIS — Z01812 Encounter for preprocedural laboratory examination: Secondary | ICD-10-CM | POA: Diagnosis present

## 2020-04-29 NOTE — Progress Notes (Signed)
Hgb 9.5 from 04/05/20. No other cbc required prior to surgery 04/29/20 per Dr Smith Robert

## 2020-04-30 LAB — SARS CORONAVIRUS 2 (TAT 6-24 HRS): SARS Coronavirus 2: NEGATIVE

## 2020-05-01 NOTE — Anesthesia Preprocedure Evaluation (Addendum)
Anesthesia Evaluation  Patient identified by MRN, date of birth, ID band Patient awake    Reviewed: Allergy & Precautions, NPO status , Patient's Chart, lab work & pertinent test results  Airway Mallampati: II  TM Distance: >3 FB Neck ROM: Full    Dental no notable dental hx. (+) Teeth Intact, Dental Advisory Given   Pulmonary neg pulmonary ROS,    Pulmonary exam normal breath sounds clear to auscultation       Cardiovascular Exercise Tolerance: Good Normal cardiovascular exam Rhythm:Regular Rate:Normal  08/13/19 Echo Left Ventricle: Left ventricular ejection fraction, by visual estimation,  is 50 to 55%. The left ventricle has normal function. The left ventricle  demonstrates global hypokinesis. There is no left ventricular hypertrophy.  Normal left atrial pressure   Neuro/Psych PSYCHIATRIC DISORDERS Bipolar Disorder negative neurological ROS     GI/Hepatic Neg liver ROS, GERD  Medicated and Controlled,  Endo/Other  diabetes, Well Controlled, Type 2, Oral Hypoglycemic Agents  Renal/GU negative Renal ROSFrom 04/05/20 K+ 3.5 Cr 0.73      Musculoskeletal   Abdominal   Peds  Hematology  (+) anemia , From 8/17 Hgb 9.5   Anesthesia Other Findings Breast CA  Reproductive/Obstetrics negative OB ROS                            Anesthesia Physical Anesthesia Plan  ASA: II  Anesthesia Plan:    Post-op Pain Management:    Induction: Intravenous  PONV Risk Score and Plan: 3 and Treatment may vary due to age or medical condition, Ondansetron and Dexamethasone  Airway Management Planned: LMA  Additional Equipment: None  Intra-op Plan:   Post-operative Plan:   Informed Consent:     Dental advisory given  Plan Discussed with: CRNA and Anesthesiologist  Anesthesia Plan Comments:         Anesthesia Quick Evaluation

## 2020-05-02 ENCOUNTER — Other Ambulatory Visit: Payer: Self-pay | Admitting: Surgical

## 2020-05-02 ENCOUNTER — Encounter (HOSPITAL_BASED_OUTPATIENT_CLINIC_OR_DEPARTMENT_OTHER)
Admission: RE | Disposition: A | Discharge status: Home / Self Care | Payer: Self-pay | Source: Home / Self Care | Attending: Plastic Surgery

## 2020-05-02 ENCOUNTER — Ambulatory Visit (HOSPITAL_BASED_OUTPATIENT_CLINIC_OR_DEPARTMENT_OTHER)
Admission: RE | Admit: 2020-05-02 | Discharge: 2020-05-02 | Disposition: A | Discharge status: Home / Self Care | Payer: BC Managed Care – PPO | Attending: Plastic Surgery | Admitting: Plastic Surgery

## 2020-05-02 ENCOUNTER — Encounter (HOSPITAL_BASED_OUTPATIENT_CLINIC_OR_DEPARTMENT_OTHER): Payer: Self-pay | Admitting: Plastic Surgery

## 2020-05-02 ENCOUNTER — Ambulatory Visit (HOSPITAL_BASED_OUTPATIENT_CLINIC_OR_DEPARTMENT_OTHER): Payer: BC Managed Care – PPO | Admitting: Anesthesiology

## 2020-05-02 ENCOUNTER — Other Ambulatory Visit: Payer: Self-pay

## 2020-05-02 DIAGNOSIS — E119 Type 2 diabetes mellitus without complications: Secondary | ICD-10-CM | POA: Insufficient documentation

## 2020-05-02 DIAGNOSIS — K219 Gastro-esophageal reflux disease without esophagitis: Secondary | ICD-10-CM | POA: Insufficient documentation

## 2020-05-02 DIAGNOSIS — Z888 Allergy status to other drugs, medicaments and biological substances status: Secondary | ICD-10-CM | POA: Diagnosis not present

## 2020-05-02 DIAGNOSIS — Z801 Family history of malignant neoplasm of trachea, bronchus and lung: Secondary | ICD-10-CM | POA: Diagnosis not present

## 2020-05-02 DIAGNOSIS — Z8589 Personal history of malignant neoplasm of other organs and systems: Secondary | ICD-10-CM | POA: Insufficient documentation

## 2020-05-02 DIAGNOSIS — Z8249 Family history of ischemic heart disease and other diseases of the circulatory system: Secondary | ICD-10-CM | POA: Insufficient documentation

## 2020-05-02 DIAGNOSIS — Z881 Allergy status to other antibiotic agents status: Secondary | ICD-10-CM | POA: Diagnosis not present

## 2020-05-02 DIAGNOSIS — L98498 Non-pressure chronic ulcer of skin of other sites with other specified severity: Secondary | ICD-10-CM | POA: Insufficient documentation

## 2020-05-02 DIAGNOSIS — E785 Hyperlipidemia, unspecified: Secondary | ICD-10-CM | POA: Diagnosis not present

## 2020-05-02 DIAGNOSIS — Z9011 Acquired absence of right breast and nipple: Secondary | ICD-10-CM | POA: Insufficient documentation

## 2020-05-02 DIAGNOSIS — Z885 Allergy status to narcotic agent status: Secondary | ICD-10-CM | POA: Insufficient documentation

## 2020-05-02 DIAGNOSIS — F419 Anxiety disorder, unspecified: Secondary | ICD-10-CM | POA: Diagnosis not present

## 2020-05-02 DIAGNOSIS — C50911 Malignant neoplasm of unspecified site of right female breast: Secondary | ICD-10-CM

## 2020-05-02 DIAGNOSIS — Z9221 Personal history of antineoplastic chemotherapy: Secondary | ICD-10-CM | POA: Insufficient documentation

## 2020-05-02 DIAGNOSIS — F319 Bipolar disorder, unspecified: Secondary | ICD-10-CM | POA: Insufficient documentation

## 2020-05-02 DIAGNOSIS — C773 Secondary and unspecified malignant neoplasm of axilla and upper limb lymph nodes: Secondary | ICD-10-CM

## 2020-05-02 DIAGNOSIS — T8189XA Other complications of procedures, not elsewhere classified, initial encounter: Secondary | ICD-10-CM | POA: Insufficient documentation

## 2020-05-02 DIAGNOSIS — S21001D Unspecified open wound of right breast, subsequent encounter: Secondary | ICD-10-CM | POA: Diagnosis not present

## 2020-05-02 DIAGNOSIS — Z853 Personal history of malignant neoplasm of breast: Secondary | ICD-10-CM | POA: Insufficient documentation

## 2020-05-02 HISTORY — PX: INCISION AND DRAINAGE OF WOUND: SHX1803

## 2020-05-02 LAB — GLUCOSE, CAPILLARY
Glucose-Capillary: 79 mg/dL (ref 70–99)
Glucose-Capillary: 92 mg/dL (ref 70–99)

## 2020-05-02 LAB — POCT PREGNANCY, URINE: Preg Test, Ur: NEGATIVE

## 2020-05-02 SURGERY — IRRIGATION AND DEBRIDEMENT WOUND
Anesthesia: General | Site: Breast | Laterality: Right

## 2020-05-02 MED ORDER — DEXAMETHASONE SODIUM PHOSPHATE 4 MG/ML IJ SOLN
INTRAMUSCULAR | Status: DC | PRN
  Administered 2020-05-02: 4 mg via INTRAVENOUS

## 2020-05-02 MED ORDER — FENTANYL CITRATE (PF) 100 MCG/2ML IJ SOLN
INTRAMUSCULAR | Status: AC
  Filled 2020-05-02: qty 2

## 2020-05-02 MED ORDER — DEXAMETHASONE SODIUM PHOSPHATE 4 MG/ML IJ SOLN: INTRAMUSCULAR | Status: DC | PRN

## 2020-05-02 MED ORDER — MIDAZOLAM HCL 2 MG/2ML IJ SOLN
INTRAMUSCULAR | Status: AC
  Filled 2020-05-02: qty 2

## 2020-05-02 MED ORDER — OXYCODONE-ACETAMINOPHEN 7.5-325 MG PO TABS: 1.0000 | ORAL_TABLET | Freq: Four times a day (QID) | ORAL | 0 refills | Status: AC | PRN

## 2020-05-02 MED ORDER — OXYCODONE HCL 5 MG/5ML PO SOLN: 5.0000 mg | Freq: Once | ORAL | Status: DC | PRN

## 2020-05-02 MED ORDER — MEPERIDINE HCL 25 MG/ML IJ SOLN: 6.2500 mg | INTRAMUSCULAR | Status: DC | PRN

## 2020-05-02 MED ORDER — MIDAZOLAM HCL 5 MG/5ML IJ SOLN
INTRAMUSCULAR | Status: DC | PRN
  Administered 2020-05-02: 2 mg via INTRAVENOUS

## 2020-05-02 MED ORDER — ACETAMINOPHEN 10 MG/ML IV SOLN: 1000.0000 mg | Freq: Once | INTRAVENOUS | Status: DC | PRN

## 2020-05-02 MED ORDER — CEFAZOLIN SODIUM-DEXTROSE 2-4 GM/100ML-% IV SOLN
2.0000 g | INTRAVENOUS | Status: AC
  Administered 2020-05-02: 2 g via INTRAVENOUS

## 2020-05-02 MED ORDER — PROPOFOL 10 MG/ML IV BOLUS
INTRAVENOUS | Status: DC | PRN
  Administered 2020-05-02: 150 mg via INTRAVENOUS

## 2020-05-02 MED ORDER — AMISULPRIDE (ANTIEMETIC) 5 MG/2ML IV SOLN: 10.0000 mg | Freq: Once | INTRAVENOUS | Status: DC | PRN

## 2020-05-02 MED ORDER — PROPOFOL 10 MG/ML IV BOLUS
INTRAVENOUS | Status: AC
  Filled 2020-05-02: qty 20

## 2020-05-02 MED ORDER — ONDANSETRON HCL 4 MG/2ML IJ SOLN: 4.0000 mg | Freq: Once | INTRAMUSCULAR | Status: DC | PRN

## 2020-05-02 MED ORDER — ONDANSETRON HCL 4 MG/2ML IJ SOLN
INTRAMUSCULAR | Status: DC | PRN
  Administered 2020-05-02: 4 mg via INTRAVENOUS

## 2020-05-02 MED ORDER — FENTANYL CITRATE (PF) 100 MCG/2ML IJ SOLN
INTRAMUSCULAR | Status: DC | PRN
Start: 2020-05-02 — End: 2020-05-02
  Administered 2020-05-02 (×2): 50 ug via INTRAVENOUS

## 2020-05-02 MED ORDER — OXYCODONE HCL 5 MG PO TABS: 5.0000 mg | ORAL_TABLET | Freq: Once | ORAL | Status: DC | PRN

## 2020-05-02 MED ORDER — CEFAZOLIN SODIUM-DEXTROSE 2-4 GM/100ML-% IV SOLN
INTRAVENOUS | Status: AC
  Filled 2020-05-02: qty 100

## 2020-05-02 MED ORDER — HYDROMORPHONE HCL 1 MG/ML IJ SOLN: 0.2500 mg | INTRAMUSCULAR | Status: DC | PRN

## 2020-05-02 MED ORDER — LIDOCAINE HCL (CARDIAC) PF 100 MG/5ML IV SOSY
PREFILLED_SYRINGE | INTRAVENOUS | Status: DC | PRN
  Administered 2020-05-02: 60 mg via INTRAVENOUS

## 2020-05-02 MED ORDER — CEFAZOLIN SODIUM-DEXTROSE 2-4 GM/100ML-% IV SOLN: 2.0000 g | INTRAVENOUS | Status: DC

## 2020-05-02 MED ORDER — BUPIVACAINE HCL (PF) 0.25 % IJ SOLN
INTRAMUSCULAR | Status: DC | PRN
  Administered 2020-05-02: 20 mL

## 2020-05-02 MED ORDER — SULFAMETHOXAZOLE-TRIMETHOPRIM 800-160 MG PO TABS: 1.0000 | ORAL_TABLET | Freq: Two times a day (BID) | ORAL | 0 refills | Status: AC

## 2020-05-02 MED ORDER — LACTATED RINGERS IV SOLN: INTRAVENOUS | Status: DC

## 2020-05-02 SURGICAL SUPPLY — 64 items
ADH SKN CLS APL DERMABOND .7 (GAUZE/BANDAGES/DRESSINGS)
APL SKNCLS STERI-STRIP NONHPOA (GAUZE/BANDAGES/DRESSINGS)
BAG DECANTER FOR FLEXI CONT (MISCELLANEOUS) IMPLANT
BALL CTTN LRG ABS STRL LF (GAUZE/BANDAGES/DRESSINGS) ×2
BENZOIN TINCTURE PRP APPL 2/3 (GAUZE/BANDAGES/DRESSINGS) IMPLANT
BLADE CLIPPER SURG (BLADE) IMPLANT
BLADE DERMATOME SS (BLADE) IMPLANT
BLADE HEX COATED 2.75 (ELECTRODE) IMPLANT
BLADE SURG 10 STRL SS (BLADE) IMPLANT
BLADE SURG 15 STRL LF DISP TIS (BLADE) ×1 IMPLANT
BLADE SURG 15 STRL SS (BLADE) ×3
BNDG COHESIVE 4X5 TAN STRL (GAUZE/BANDAGES/DRESSINGS) IMPLANT
BNDG ELASTIC 4X5.8 VLCR STR LF (GAUZE/BANDAGES/DRESSINGS) IMPLANT
CANISTER SUCT 1200ML W/VALVE (MISCELLANEOUS) IMPLANT
CLOSURE WOUND 1/2 X4 (GAUZE/BANDAGES/DRESSINGS)
COTTONBALL LRG STERILE PKG (GAUZE/BANDAGES/DRESSINGS) ×6 IMPLANT
COVER BACK TABLE 60X90IN (DRAPES) ×3 IMPLANT
COVER MAYO STAND STRL (DRAPES) ×3 IMPLANT
COVER WAND RF STERILE (DRAPES) IMPLANT
DECANTER SPIKE VIAL GLASS SM (MISCELLANEOUS) IMPLANT
DERMABOND ADVANCED (GAUZE/BANDAGES/DRESSINGS)
DERMABOND ADVANCED .7 DNX12 (GAUZE/BANDAGES/DRESSINGS) IMPLANT
DERMACARRIERS GRAFT 1 TO 1.5 (DISPOSABLE)
DRAIN CHANNEL 15F RND FF W/TCR (WOUND CARE) ×3 IMPLANT
DRAPE LAPAROTOMY 100X72 PEDS (DRAPES) IMPLANT
DRAPE U-SHAPE 76X120 STRL (DRAPES) IMPLANT
DRSG PAD ABDOMINAL 8X10 ST (GAUZE/BANDAGES/DRESSINGS) IMPLANT
DRSG TEGADERM 4X10 (GAUZE/BANDAGES/DRESSINGS) IMPLANT
ELECT REM PT RETURN 9FT ADLT (ELECTROSURGICAL)
ELECTRODE REM PT RTRN 9FT ADLT (ELECTROSURGICAL) IMPLANT
GAUZE 4X4 16PLY RFD (DISPOSABLE) IMPLANT
GAUZE SPONGE 4X4 12PLY STRL (GAUZE/BANDAGES/DRESSINGS) ×6 IMPLANT
GAUZE XEROFORM 5X9 LF (GAUZE/BANDAGES/DRESSINGS) ×3 IMPLANT
GLOVE BIOGEL M STRL SZ7.5 (GLOVE) ×3 IMPLANT
GLOVE BIOGEL PI IND STRL 8 (GLOVE) ×1 IMPLANT
GLOVE BIOGEL PI INDICATOR 8 (GLOVE) ×2
GOWN STRL REUS W/ TWL LRG LVL3 (GOWN DISPOSABLE) ×1 IMPLANT
GOWN STRL REUS W/TWL LRG LVL3 (GOWN DISPOSABLE) ×3
GRAFT DERMACARRIERS 1 TO 1.5 (DISPOSABLE) IMPLANT
NEEDLE PRECISIONGLIDE 27X1.5 (NEEDLE) ×3 IMPLANT
NS IRRIG 1000ML POUR BTL (IV SOLUTION) ×3 IMPLANT
PACK BASIN DAY SURGERY FS (CUSTOM PROCEDURE TRAY) ×3 IMPLANT
PAD CAST 4YDX4 CTTN HI CHSV (CAST SUPPLIES) IMPLANT
PADDING CAST COTTON 4X4 STRL (CAST SUPPLIES)
PENCIL SMOKE EVACUATOR (MISCELLANEOUS) IMPLANT
SHEET MEDIUM DRAPE 40X70 STRL (DRAPES) IMPLANT
STRIP CLOSURE SKIN 1/2X4 (GAUZE/BANDAGES/DRESSINGS) IMPLANT
SUCTION FRAZIER HANDLE 10FR (MISCELLANEOUS)
SUCTION TUBE FRAZIER 10FR DISP (MISCELLANEOUS) IMPLANT
SUT CHROMIC 5 0 P 3 (SUTURE) IMPLANT
SUT ETHILON 4 0 P 3 18 (SUTURE) IMPLANT
SUT MON AB 4-0 PC3 18 (SUTURE) IMPLANT
SUT MON AB 5-0 PS2 18 (SUTURE) IMPLANT
SUT PROLENE 4 0 P 3 18 (SUTURE) IMPLANT
SUT VIC AB 3-0 FS2 27 (SUTURE) IMPLANT
SUT VICRYL 4-0 PS2 18IN ABS (SUTURE) IMPLANT
SYR BULB EAR ULCER 3OZ GRN STR (SYRINGE) ×3 IMPLANT
SYR CONTROL 10ML LL (SYRINGE) ×3 IMPLANT
TOWEL GREEN STERILE FF (TOWEL DISPOSABLE) ×3 IMPLANT
TRAY DSU PREP LF (CUSTOM PROCEDURE TRAY) ×3 IMPLANT
TUBE CONNECTING 20'X1/4 (TUBING)
TUBE CONNECTING 20X1/4 (TUBING) IMPLANT
UNDERPAD 30X36 HEAVY ABSORB (UNDERPADS AND DIAPERS) IMPLANT
YANKAUER SUCT BULB TIP NO VENT (SUCTIONS) IMPLANT

## 2020-05-02 NOTE — Interval H&P Note (Signed)
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  The various methods of treatment have been discussed with the patient and family. After consideration of risks, benefits and other options for treatment, the patient has consented to  Procedure(s) with comments: IRRIGATION AND DEBRIDEMENT WOUND WITH REMOVAL OF RIGHT TISSUE EXPANDER (Right) - 1 hour please as a surgical intervention.  The patient's history has been reviewed, patient examined, no change in status, stable for surgery.  I have reviewed the patient's chart and labs.  Questions were answered to the patient's satisfaction.     [name]

## 2020-05-02 NOTE — Transfer of Care (Signed)
Immediate Anesthesia Transfer of Care Note  Patient: Heather Bell  Procedure(s) Performed: IRRIGATION AND DEBRIDEMENT WOUND WITH REMOVAL OF RIGHT TISSUE EXPANDER (Right Breast)  Patient Location: PACU  Anesthesia Type:General  Level of Consciousness: awake, alert , oriented and patient cooperative  Airway & Oxygen Therapy: Patient Spontanous Breathing and Patient connected to face mask oxygen  Post-op Assessment: Report given to RN and Post -op Vital signs reviewed and stable  Post vital signs: Reviewed and stable  Last Vitals:  Vitals Value Taken Time  BP 113/78 05/02/20 1046  Temp    Pulse 104 05/02/20 1048  Resp 18 05/02/20 1048  SpO2 100 % 05/02/20 1048  Vitals shown include unvalidated device data.  Last Pain:  Vitals:   05/02/20 0918  TempSrc: Oral  PainSc: 0-No pain         Complications: No complications documented.

## 2020-05-02 NOTE — Discharge Instructions (Addendum)
Activity As tolerated: NO showers for 3 days. Keep ACE wrap on breasts until then. After showering, put ACE wrap back on, this is important for compression. NO driving while in pain, taking pain medication or if you are unable to safely react to traffic. No heavy activities Take Pain medication as needed for severe pain. Otherwise, you can use ibuprofen or tylenol as needed. Avoid more than 3,000 mg of tylenol in 24 hours.   Monitor drain output daily, record every 24 hours.  Diet: Regular. Drink plenty of fluids and eat healthy, high protein, low carbs.  Wound Care: Keep dressing clean & dry. You may change bandages after showering if you continue to notice some drainage. You can reuse bandages if they are not dirty/soiled.  Special Instructions: Call Doctor if any unusual problems occur such as pain, excessive Bleeding, unrelieved Nausea/vomiting, Fever &/or chills  Follow-up appointment: Scheduled for next week.   Post Anesthesia Home Care Instructions  Activity: Get plenty of rest for the remainder of the day. A responsible individual must stay with you for 24 hours following the procedure.  For the next 24 hours, DO NOT: -Drive a car -Paediatric nurse -Drink alcoholic beverages -Take any medication unless instructed by your physician -Make any legal decisions or sign important papers.  Meals: Start with liquid foods such as gelatin or soup. Progress to regular foods as tolerated. Avoid greasy, spicy, heavy foods. If nausea and/or vomiting occur, drink only clear liquids until the nausea and/or vomiting subsides. Call your physician if vomiting continues.  Special Instructions/Symptoms: Your throat may feel dry or sore from the anesthesia or the breathing tube placed in your throat during surgery. If this causes discomfort, gargle with warm salt water. The discomfort should disappear within 24 hours.  If you had a scopolamine patch placed behind your ear for the management of  post- operative nausea and/or vomiting:  1. The medication in the patch is effective for 72 hours, after which it should be removed.  Wrap patch in a tissue and discard in the trash. Wash hands thoroughly with soap and water. 2. You may remove the patch earlier than 72 hours if you experience unpleasant side effects which may include dry mouth, dizziness or visual disturbances. 3. Avoid touching the patch. Wash your hands with soap and water after contact with the patch.        JP Drain [employer] this sheet to all of your post-operative appointments while you have your drains.  Please measure your drains by CC's or ML's.  Make sure you drain and measure your JP Drains 2 or 3 times per day.  At the end of each day, add up totals for the left side and add up totals for the right side.    ( 9 am )     ( 3 pm )        ( 9 pm )                Date L  R  L  R  L  R  Total L/R

## 2020-05-02 NOTE — Anesthesia Procedure Notes (Signed)
Procedure Name: LMA Insertion Date/Time: 05/02/2020 10:06 AM Performed by: Signe Colt, CRNA Pre-anesthesia Checklist: Patient identified, Emergency Drugs available, Suction available and Patient being monitored Patient Re-evaluated:Patient Re-evaluated prior to induction Oxygen Delivery Method: Circle system utilized Preoxygenation: Pre-oxygenation with 100% oxygen Induction Type: IV induction Ventilation: Mask ventilation without difficulty LMA: LMA inserted LMA Size: 4.0 Number of attempts: 1 Airway Equipment and Method: Bite block Placement Confirmation: positive ETCO2 Tube secured with: Tape Dental Injury: Teeth and Oropharynx as per pre-operative assessment

## 2020-05-02 NOTE — Progress Notes (Signed)
Postop pain medications

## 2020-05-02 NOTE — Anesthesia Postprocedure Evaluation (Signed)
Anesthesia Post Note  Patient: Heather Bell  Procedure(s) Performed: IRRIGATION AND DEBRIDEMENT WOUND WITH REMOVAL OF RIGHT TISSUE EXPANDER (Right Breast)     Patient location during evaluation: PACU Anesthesia Type: General Level of consciousness: awake and alert Pain management: pain level controlled Vital Signs Assessment: post-procedure vital signs reviewed and stable Respiratory status: spontaneous breathing, nonlabored ventilation, respiratory function stable and patient connected to nasal cannula oxygen Cardiovascular status: blood pressure returned to baseline and stable Postop Assessment: no apparent nausea or vomiting Anesthetic complications: no   No complications documented.  Last Vitals:  Vitals:   05/02/20 1130 05/02/20 1159  BP: 111/74 (!) 104/57  Pulse: 91 90  Resp: 15 16  Temp:  36.7 C  SpO2: 98% 97%    Last Pain:  Vitals:   05/02/20 1159  TempSrc:   PainSc: 0-No pain                 Barnet Glasgow

## 2020-05-02 NOTE — Brief Op Note (Signed)
05/02/2020  10:45 AM  PATIENT:  Heather Bell  44 y.o. 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, Right Breast wound  PROCEDURE:  Procedure(s) with comments: IRRIGATION AND DEBRIDEMENT WOUND WITH REMOVAL OF RIGHT TISSUE EXPANDER (Right) - 1 hour please  SURGEON:  Surgeon(s) and Role:    * Laney Louderback, Steffanie Dunn, MD - Primary  PHYSICIAN ASSISTANT: Software engineer, PA  ASSISTANTS: none   ANESTHESIA:   general  EBL:  15   BLOOD ADMINISTERED:none  DRAINS: (1) Jackson-Pratt drain(s) with closed bulb suction in the right chest   LOCAL MEDICATIONS USED:  MARCAINE     SPECIMEN:  Source of Specimen:  right breast tissue  DISPOSITION OF SPECIMEN:  PATHOLOGY  COUNTS:  YES  TOURNIQUET:  * Missing tourniquet times found for documented tourniquets in log: [id number] *  DICTATION: .Dragon Dictation  PLAN OF CARE: Discharge to home after PACU  PATIENT DISPOSITION:  PACU - hemodynamically stable.   Delay start of Pharmacological VTE agent (>24hrs) due to surgical blood loss or risk of bleeding: not applicable

## 2020-05-02 NOTE — Op Note (Signed)
Operative Note   DATE OF OPERATION: 05/02/2020  SURGICAL DEPARTMENT: Plastic Surgery  PREOPERATIVE DIAGNOSES: Right breast mastectomy flap necrosis  POSTOPERATIVE DIAGNOSES:  same  PROCEDURE: 1.  Debridement of right breast mastectomy flap necrosis 6 x 6 cm including skin and subcutaneous tissue.  This was an excisional debridement. 2.  Removal of right breast tissue expander 3.  Complex closure right breast totaling 8 cm in length  SURGEON: Talmadge Coventry, MD  ASSISTANT: Verdie Shire, PA The advanced practice practitioner (APP) assisted throughout the case.  The APP was essential in retraction and counter traction when needed to make the case progress smoothly.  This retraction and assistance made it possible to see the tissue plans for the procedure.  The assistance was needed for blood control, tissue re-approximation and assisted with closure of the incision site.  ANESTHESIA:  General.   COMPLICATIONS: None.   INDICATIONS FOR PROCEDURE:  The patient, Heather Bell is a 44 y.o. female born on 06-14-1976, is here for treatment of right breast mastectomy flap necrosis in the setting of an attempted tissue expander reconstruction. MRN: [id number]  CONSENT:  Informed consent was obtained directly from the patient. Risks, benefits and alternatives were fully discussed. Specific risks including but not limited to bleeding, infection, hematoma, seroma, scarring, pain, contracture, asymmetry, wound healing problems, and need for further surgery were all discussed. The patient did have an ample opportunity to have questions answered to satisfaction.   DESCRIPTION OF PROCEDURE:  The patient was taken to the operating room. SCDs were placed and Ancef antibiotics were given.  General anesthesia was administered.  The patient's operative site was prepped and draped in a sterile fashion. A time out was performed and all information was confirmed to be correct.  Started by examining the wound.   There was a 6 x 6 cm area of full-thickness mastectomy flap necrosis.  This was debrided with a 15 blade in an excisional fashion.  Hemostasis was obtained.  The exposed acellular dermal matrix was removed as well.  The tissue expander was then removed.  Pocket was irrigated with triple antibiotic solution.  The edges were curetted with the bevel of a 10 blade.  Hemostasis was obtained.  A 15 [place] JP was placed and secured with a nylon suture.  Surrounding skin was then undermined and advanced and closed in layers with interrupted buried 3-0 PDS sutures and a running 3-0 PDS.  She was then covered with a soft dressing and an Ace wrap.  The patient tolerated the procedure well.  There were no complications. The patient was allowed to wake from anesthesia, extubated and taken to the recovery room in satisfactory condition.

## 2020-05-02 NOTE — H&P (Signed)
Reason for Consult/CC:mastectomy flap necrosis  Heather Bell is an 44 y.o. female.  HPI: Pt here for debridement of mastectomy flap and removal of TE.  Allergies:  Allergies  Allergen Reactions  . Compazine [Prochlorperazine Edisylate] Other (See Comments)    Acute dystonic reaction  . Morphine And Related Shortness Of Breath  . Doxycycline Other (See Comments)    "made all my symptoms worse"   . Fluticasone Other (See Comments)    Doesn't work  . Levofloxacin Other (See Comments)    "made all my symptoms worse"     Medications:  Current Facility-Administered Medications:  .  ceFAZolin (ANCEF) IVPB 2g/100 mL premix, 2 g, Intravenous, On Call to OR, [name], MD .  ceFAZolin (ANCEF) IVPB 2g/100 mL premix, 2 g, Intravenous, On Call to OR, Herbert Pun, MD .  lactated ringers infusion, , Intravenous, Continuous, Effie Berkshire, MD  Facility-Administered Medications Ordered in Other Encounters:  .  0.9 %  sodium chloride infusion, , Intravenous, Continuous, Lequita Asal, MD, Stopped at 01/08/20 1516 .  sodium chloride flush (NS) 0.9 % injection 10 mL, 10 mL, Intravenous, PRN, Mike Gip, Melissa C, MD, 10 mL at 01/08/20 1314  Past Medical History:  Diagnosis Date  . Allergic rhinitis   . Anxiety   . Bipolar disorder ([place])    depression  . Cancer [facility])    breast right  . Depression   . Diabetes ([place])   . GERD (gastroesophageal reflux disease)   . Hyperlipidemia     Past Surgical History:  Procedure Laterality Date  . BREAST BIOPSY Right 07/22/2019   mass bx, path pending, heart marker  . BREAST BIOPSY Right 07/22/2019   LN bx, path pending,  butterfly hydromarker  . BREAST RECONSTRUCTION WITH PLACEMENT OF TISSUE EXPANDER AND FLEX HD (ACELLULAR HYDRATED DERMIS) Right 03/21/2020   Procedure: RIGHT BREAST RECONSTRUCTION WITH PLACEMENT OF TISSUE EXPANDER AND FLEX HD (ACELLULAR HYDRATED DERMIS);  Surgeon: [name], MD;  Location: [facility] ORS;   Service: Plastics;  Laterality: Right;  . IMAGE GUIDED SINUS SURGERY    . KNEE SURGERY    . NASAL SEPTUM SURGERY    . PORTACATH PLACEMENT N/A 08/07/2019   Procedure: INSERTION PORT-A-CATH;  Surgeon: Herbert Pun, MD;  Location: [facility] ORS;  Service: General;  Laterality: N/A;  . TONSILLECTOMY    . TOTAL MASTECTOMY Right 03/21/2020   Procedure: TOTAL MASTECTOMY;  Surgeon: Herbert Pun, MD;  Location: [facility] ORS;  Service: General;  Laterality: Right;    Family History  Problem Relation Age of Onset  . Lung cancer Father   . Heart attack Father   . Lung cancer Paternal Uncle   . Heart disease Paternal Uncle   . Lung cancer Paternal Aunt     Social History:  reports that she has never smoked. She has never used smokeless tobacco. She reports that she does not drink alcohol and does not use drugs.  Physical Exam Blood pressure 102/63, pulse 85, temperature 98.4 F (36.9 C), temperature source Oral, resp. rate 18, height 5\' 6"  (1.676 m), weight 64 kg, last menstrual period 06/21/2019, SpO2 100 %. General: NAD, AOx3 Mastectomy flap necrosis and subcutneous fluid r breast. RRR Unlabored respirations  No results found for this or any previous visit (from the past 48 hour(s)).  No results found.  Assessment/Plan: To OR for mastectomy flap necrosis excision and removal of tissue expander. R&B discussed.  [name] 05/02/2020, 9:30 AM

## 2020-05-03 LAB — SURGICAL PATHOLOGY

## 2020-05-04 ENCOUNTER — Ambulatory Visit: Payer: BC Managed Care – PPO | Admitting: Surgical

## 2020-05-05 ENCOUNTER — Ambulatory Visit (INDEPENDENT_AMBULATORY_CARE_PROVIDER_SITE_OTHER): Payer: BC Managed Care – PPO | Admitting: Surgical

## 2020-05-05 ENCOUNTER — Other Ambulatory Visit: Payer: Self-pay

## 2020-05-05 ENCOUNTER — Encounter: Payer: Self-pay | Admitting: Surgical

## 2020-05-05 VITALS — BP 96/67 | HR 96 | Temp 98.6°F

## 2020-05-05 DIAGNOSIS — C50911 Malignant neoplasm of unspecified site of right female breast: Secondary | ICD-10-CM

## 2020-05-05 DIAGNOSIS — C773 Secondary and unspecified malignant neoplasm of axilla and upper limb lymph nodes: Secondary | ICD-10-CM

## 2020-05-05 DIAGNOSIS — S21001D Unspecified open wound of right breast, subsequent encounter: Secondary | ICD-10-CM

## 2020-05-05 NOTE — Progress Notes (Signed)
Patient is a 44 year old female here for follow-up after debridement of right breast mastectomy flap necrosis, removal of right breast tissue expander on 05/02/2020 with Dr. Claudia Desanctis.  Patient currently has a 78 [place] JP drain in place.  Patient is doing well, no fevers, chills, nausea, vomiting.  Chaperone present on exam On exam right breast incision intact, tissue is viable, no necrosis noted.  No cellulitic changes or erythema noted.  Right JP drain in place with serosanguineous drainage in bulb, approximately 10 cc today.  Recommend continue to monitor JP drain output, continue to wear compressive garment, follow-up scheduled for next week to reevaluate and potentially remove JP drain.  No sign of infection, seroma, hematoma. Recommend calling with any questions or concerns

## 2020-05-07 LAB — AEROBIC/ANAEROBIC CULTURE W GRAM STAIN (SURGICAL/DEEP WOUND)

## 2020-05-09 ENCOUNTER — Encounter (HOSPITAL_BASED_OUTPATIENT_CLINIC_OR_DEPARTMENT_OTHER): Payer: Self-pay | Admitting: Plastic Surgery

## 2020-05-13 ENCOUNTER — Other Ambulatory Visit: Payer: Self-pay

## 2020-05-13 ENCOUNTER — Ambulatory Visit (INDEPENDENT_AMBULATORY_CARE_PROVIDER_SITE_OTHER): Payer: BC Managed Care – PPO | Admitting: Surgical

## 2020-05-13 ENCOUNTER — Encounter: Payer: Self-pay | Admitting: Surgical

## 2020-05-13 ENCOUNTER — Telehealth: Payer: Self-pay

## 2020-05-13 VITALS — BP 114/77 | Temp 97.8°F

## 2020-05-13 DIAGNOSIS — S21001D Unspecified open wound of right breast, subsequent encounter: Secondary | ICD-10-CM

## 2020-05-13 DIAGNOSIS — C50911 Malignant neoplasm of unspecified site of right female breast: Secondary | ICD-10-CM

## 2020-05-13 DIAGNOSIS — C773 Secondary and unspecified malignant neoplasm of axilla and upper limb lymph nodes: Secondary | ICD-10-CM

## 2020-05-13 NOTE — Telephone Encounter (Signed)
Faxed referral to [facility] for Mastectomy supplies

## 2020-05-13 NOTE — Progress Notes (Signed)
Patient is a 44 year old female here for follow-up after debridement of right breast mastectomy flap necrosis, removal of right breast tissue expander on 05/03/2019 with Dr. Claudia Desanctis.  She is doing well.  JP drain output has been approximately 10 cc every 24 hours consistently over the past few days.  She is doing well.  Chaperone present on exam On exam right breast incision intact, PDS sutures in place, no necrosis noted.  No cellulitic changes.  No fluid collections noted with palpation.  Serosanguineous drainage noted in bulb.  Recommend Vaseline and gauze daily to right JP drain insertion site wound, JP drain removed, patient tolerated this fine.  No sign of infection, seroma, hematoma.  Provided patient with prescription for [employer] for mastectomy bra/compression garments.  Recommend following up in 2 weeks for reevaluation with Dr. Claudia Desanctis to discuss further surgical options.

## 2020-05-17 ENCOUNTER — Other Ambulatory Visit: Payer: Self-pay

## 2020-05-17 ENCOUNTER — Inpatient Hospital Stay: Payer: BC Managed Care – PPO | Attending: Hematology and Oncology

## 2020-05-17 DIAGNOSIS — Z171 Estrogen receptor negative status [ER-]: Secondary | ICD-10-CM | POA: Insufficient documentation

## 2020-05-17 DIAGNOSIS — C50511 Malignant neoplasm of lower-outer quadrant of right female breast: Secondary | ICD-10-CM | POA: Diagnosis present

## 2020-05-17 DIAGNOSIS — Z95828 Presence of other vascular implants and grafts: Secondary | ICD-10-CM

## 2020-05-17 DIAGNOSIS — C50911 Malignant neoplasm of unspecified site of right female breast: Secondary | ICD-10-CM | POA: Diagnosis not present

## 2020-05-17 DIAGNOSIS — Z452 Encounter for adjustment and management of vascular access device: Secondary | ICD-10-CM | POA: Insufficient documentation

## 2020-05-17 DIAGNOSIS — C773 Secondary and unspecified malignant neoplasm of axilla and upper limb lymph nodes: Secondary | ICD-10-CM | POA: Diagnosis not present

## 2020-05-17 DIAGNOSIS — Z9011 Acquired absence of right breast and nipple: Secondary | ICD-10-CM | POA: Diagnosis not present

## 2020-05-17 MED ORDER — SODIUM CHLORIDE 0.9% FLUSH
10.0000 mL | Freq: Once | INTRAVENOUS | Status: AC
  Administered 2020-05-17: 10 mL via INTRAVENOUS
  Filled 2020-05-17: qty 10

## 2020-05-17 MED ORDER — HEPARIN SOD (PORK) LOCK FLUSH 100 UNIT/ML IV SOLN
500.0000 [IU] | Freq: Once | INTRAVENOUS | Status: AC
  Administered 2020-05-17: 500 [IU] via INTRAVENOUS
  Filled 2020-05-17: qty 5

## 2020-05-17 MED ORDER — SODIUM CHLORIDE 0.9% FLUSH
10.0000 mL | Freq: Once | INTRAVENOUS | Status: DC
  Filled 2020-05-17: qty 10

## 2020-06-01 ENCOUNTER — Other Ambulatory Visit: Payer: Self-pay

## 2020-06-01 ENCOUNTER — Ambulatory Visit (INDEPENDENT_AMBULATORY_CARE_PROVIDER_SITE_OTHER): Payer: BC Managed Care – PPO | Admitting: Plastic Surgery

## 2020-06-01 ENCOUNTER — Encounter: Payer: Self-pay | Admitting: Plastic Surgery

## 2020-06-01 VITALS — BP 89/57 | HR 90 | Temp 96.9°F | Ht 66.0 in | Wt 146.0 lb

## 2020-06-01 DIAGNOSIS — C50911 Malignant neoplasm of unspecified site of right female breast: Secondary | ICD-10-CM

## 2020-06-01 DIAGNOSIS — C773 Secondary and unspecified malignant neoplasm of axilla and upper limb lymph nodes: Secondary | ICD-10-CM

## 2020-06-01 NOTE — Progress Notes (Signed)
Patient presents about a month out from removal of the right-sided tissue expander due to mastectomy flap necrosis and infection.  She feels like she is doing well.  On examination incision looks to be healing fine all the sutures were removed.  I will detect any subcutaneous fluid or signs of persistent infection.  We do not plan to set another appointment for about 5 months from now to revisit another attempt at reconstruction on the right side.  I explained that the decision would be to simply place another tissue expander versus recruiting additional tissue from her back using a latissimus flap.  We can discuss that further at the time.  If she does note any further issues with her current wound or surgical sites she knows to call us and we can get her in at any point.  All her questions were answered.

## 2020-06-06 ENCOUNTER — Ambulatory Visit: Payer: BC Managed Care – PPO

## 2020-06-15 ENCOUNTER — Encounter: Payer: Self-pay | Admitting: Internal Medicine

## 2020-06-15 ENCOUNTER — Other Ambulatory Visit: Payer: Self-pay

## 2020-06-15 ENCOUNTER — Ambulatory Visit (INDEPENDENT_AMBULATORY_CARE_PROVIDER_SITE_OTHER): Payer: BC Managed Care – PPO | Admitting: Internal Medicine

## 2020-06-15 VITALS — BP 98/68 | HR 90 | Ht 66.0 in | Wt 143.0 lb

## 2020-06-15 DIAGNOSIS — I429 Cardiomyopathy, unspecified: Secondary | ICD-10-CM | POA: Diagnosis not present

## 2020-06-15 DIAGNOSIS — R931 Abnormal findings on diagnostic imaging of heart and coronary circulation: Secondary | ICD-10-CM | POA: Diagnosis not present

## 2020-06-15 NOTE — Progress Notes (Signed)
Follow-up Outpatient Visit Date: 06/15/2020  Primary Care Provider: [facility] [place] [address]  Chief Complaint: Follow-up abnormal echo  HPI:  Heather Bell is a 44 y.o. female with history of , diabetes mellitus, breast cancer, and depression, as well as prior abnormal echo (LVEF low normal but reported as having global hypokinesis), who presents for follow-up of possible cardiomyopathy. I last saw her in April, at which time she was doing well. We have been following her EF with serial MUGA scans showing stable and normal LVEF, most recently 57% in early June.  Today, Heather Bell reports she is feeling relatively well.  She denies chest pain, shortness of breath, palpitations, lightheadedness, and edema.  She has completed chemotherapy and right total mastectomy, followed by reconstructive surgery.  --------------------------------------------------------------------------------------------------  Cardiovascular History & Procedures: Cardiovascular Problems:  Question cardiomyopathy  Risk Factors:  Diabetes mellitus, hyperlipidemia, and family history  Cath/PCI:  None  CV Surgery:  None  EP Procedures and Devices:  None  Non-Invasive Evaluation(s):  MUGA (01/22/2020): LVEF 57%.  MUGA (12/08/2019): LVEF 57%.  MUGA (08/24/2019): LVEF 60%.  TTE (08/13/2019): Normal LV size and wall thickness. LVEF 50-55% with global hypokinesis. Normal RV size and function. Aortic sclerosis. Trivial mitral regurgitation.  Recent CV Pertinent Labs: Lab Results  Component Value Date   K 3.5 04/05/2020   K 3.4 (L) 03/29/2014   MG 1.8 03/08/2020   MG 6.0 (H) 06/09/2012   BUN 11 04/05/2020   BUN 6 (L) 03/29/2014   CREATININE 0.73 04/05/2020   CREATININE 0.85 03/29/2014    Past medical and surgical history were reviewed and updated in EPIC.  Current Meds  Medication Sig  . amphetamine-dextroamphetamine (ADDERALL XR)  25 MG 24 hr capsule Take 25 mg by mouth daily.  . Calcium Carb-Cholecalciferol (CALCIUM 600+D) 600-800 MG-UNIT TABS Take 1 tablet by mouth daily.  . cetirizine (ZYRTEC) 10 MG tablet Take 10 mg by mouth daily.   . cyclobenzaprine (FLEXERIL) 5 MG tablet Take 1 tablet (5 mg total) by mouth 3 (three) times daily as needed for muscle spasms.  . diazepam (VALIUM) 10 MG tablet Take 10 mg by mouth 2 (two) times daily.  Marland Kitchen doxepin (SINEQUAN) 25 MG capsule Take 25-50 mg by mouth at bedtime.   Marland Kitchen HYDROcodone-acetaminophen (NORCO/VICODIN) 5-325 MG tablet as needed.  Marland Kitchen ibuprofen (ADVIL) 200 MG tablet Take 400-800 mg by mouth every 6 (six) hours as needed for headache or moderate pain.  Marland Kitchen lamoTRIgine (LAMICTAL) 200 MG tablet Take 200 mg by mouth at bedtime.   . lidocaine-prilocaine (EMLA) cream Apply topically over port-a-cath 30 minutes to 1 hour prior to treatment.  Marland Kitchen LORazepam (ATIVAN) 0.5 MG tablet Take 0.5 mg by mouth every 8 (eight) hours as needed (nausea).  . lurasidone (LATUDA) 40 MG TABS tablet Take 40 mg by mouth at bedtime.   . Lurasidone HCl 60 MG TABS Take 60 mg by mouth at bedtime.   . metFORMIN (GLUCOPHAGE-XR) 500 MG 24 hr tablet Take 1,000 mg by mouth 2 (two) times daily.  . mometasone (NASONEX) 50 MCG/ACT nasal spray Place 2 sprays into the nose daily as needed (allergies).   . Multiple Vitamin (MULTIVITAMIN WITH MINERALS) TABS tablet Take 1 tablet by mouth daily.  Marland Kitchen omeprazole (PRILOSEC) 20 MG capsule Take 20 mg by mouth daily before breakfast.   . oxyCODONE-acetaminophen (PERCOCET) 7.5-325 MG tablet Take by mouth.  . QUEtiapine (SEROQUEL) 300 MG tablet Take 300 mg by mouth at  bedtime.  . simvastatin (ZOCOR) 20 MG tablet Take 20 mg by mouth daily.  Marland Kitchen topiramate (TOPAMAX) 100 MG tablet Take 50-100 mg by mouth See admin instructions. Take 1 tablet (100 mg) by mouth in the morning & take 0.5 tablet (50 mg) by mouth at night.  . vitamin B-12 (CYANOCOBALAMIN) 1000 MCG tablet Take 1,000 mcg by  mouth daily.    Allergies: Compazine [prochlorperazine edisylate], Morphine and related, Doxycycline, Fluticasone, and Levofloxacin  Social History   Tobacco Use  . Smoking status: Never Smoker  . Smokeless tobacco: Never Used  Vaping Use  . Vaping Use: Never used  Substance Use Topics  . Alcohol use: No    Alcohol/week: 0.0 standard drinks  . Drug use: No    Family History  Problem Relation Age of Onset  . Lung cancer Father   . Heart attack Father   . Lung cancer Paternal Uncle   . Heart disease Paternal Uncle   . Lung cancer Paternal Aunt     Review of Systems: A 12-system review of systems was performed and was negative except as noted in the HPI.  --------------------------------------------------------------------------------------------------  Physical Exam: BP 98/68 (BP Location: Left Arm, Patient Position: Sitting, Cuff Size: Normal)   Pulse 90   Ht 5\' 6"  (1.676 m)   Wt 143 lb (64.9 kg)   SpO2 98%   BMI 23.08 kg/m   General: NAD. Neck: No JVD or HJR. Lungs: Clear to auscultation bilaterally without wheezes or crackles. Heart: Regular rate and rhythm without murmurs, rubs, or gallops. Abdomen: Soft, nontender, nondistended. Extremities: No lower extremity edema.  EKG: Normal sinus rhythm with borderline left atrial enlargement.  No significant change from prior tracing on 12/20/2019.  Lab Results  Component Value Date   WBC 7.1 04/05/2020   HGB 9.5 (L) 04/05/2020   HCT 29.2 (L) 04/05/2020   MCV 96.7 04/05/2020   PLT 346 04/05/2020    Lab Results  Component Value Date   NA 136 04/05/2020   K 3.5 04/05/2020   CL 106 04/05/2020   CO2 22 04/05/2020   BUN 11 04/05/2020   CREATININE 0.73 04/05/2020   GLUCOSE 135 (H) 04/05/2020   ALT 17 04/05/2020    No results found for: CHOL, HDL, LDLCALC, LDLDIRECT, TRIG, CHOLHDL  --------------------------------------------------------------------------------------------------  ASSESSMENT AND  PLAN: Abnormal echocardiogram and risk for cardiomyopathy: Heather Bell does not report any signs or symptoms of heart failure.  She appears euvolemic on examination today.  Most recent EF evaluation by MUGA scan in June showed preserved LVEF.  We have agreed to defer medication changes today.  We will plan to repeat a MUGA scan next June, 1 year after her prior study.  We will defer medication changes at this time.  Follow-up: Return to clinic shortly after MUGA scan in 01/2021.  Nelva Bush, MD 06/15/2020 10:17 AM

## 2020-06-15 NOTE — Patient Instructions (Signed)
Medication Instructions:  Your physician recommends that you continue on your current medications as directed. Please refer to the Current Medication list given to you today.  *If you need a refill on your cardiac medications before your next appointment, please call your pharmacy*   Lab Work: none If you have labs (blood work) drawn today and your tests are completely normal, you will receive your results only by: Marland Kitchen MyChart Message (if you have MyChart) OR . A paper copy in the mail If you have any lab test that is abnormal or we need to change your treatment, we will call you to review the results.   Testing/Procedures: Your physician has requested that you have a MUGA: A multigated acquisition (MUGA) scan is a test that looks at the chambers and blood vessels of the heart. Please see the CareNotes handout/brochure given to you today for further information. I will need to get the prior authorization before it can be scheduled.  LOCATION:________________________________________ TIME/DATE:________________________________________   Follow-Up: At [facility], you and your health needs are our priority.  As part of our continuing mission to provide you with exceptional heart care, we have created designated Provider Care Teams.  These Care Teams include your primary Cardiologist (physician) and Advanced Practice Providers (APPs -  Physician Assistants and Nurse Practitioners) who all work together to provide you with the care you need, when you need it.  We recommend signing up for the patient portal called "MyChart".  Sign up information is provided on this After Visit Summary.  MyChart is used to connect with patients for Virtual Visits (Telemedicine).  Patients are able to view lab/test results, encounter notes, upcoming appointments, etc.  Non-urgent messages can be sent to your provider as well.   To learn more about what you can do with MyChart, go to [email or web address].     Your next appointment:   8 month(s) after MUGA scan.  The format for your next appointment:   In Person  Provider:   You may see DR Harrell Gave END or one of the following Advanced Practice Providers on your designated Care Team:    Murray Hodgkins, NP  Christell Faith, PA-C  Marrianne Mood, PA-C  Cadence [place], [place]     MUGA Scan  A MUGA scan (multigated acquisition scan) is a test that creates images of the heart at specific times while it beats. The test involves having a radioactive dye (tracer) injected into the bloodstream. The tracer lets the health care provider see red blood cells passing through the heart during the scan. The test may be done while you are resting or while you are exercising. Having this test done while exercising can help determine if changes occur when your heart is under stress. A MUGA scan shows:  How much blood the heart is pumping out with each heartbeat.  How well the chambers in the lower part of the heart (the ventricles) are working. Ventricles pump blood to the lungs and the rest of the body. A MUGA scan may be done to determine:  What is causing heart symptoms, such as chest pain.  If your heart has been damaged by a heart attack or chemotherapy.  If your heart has trouble pumping blood. Tell a health care provider about:  Any allergies you have.  All medicines you are taking, including vitamins, herbs, eye drops, creams, and over-the-counter medicines.  Any blood disorders you have.  Any surgeries you have had.  Any medical conditions you have.  Whether you are  pregnant, may be pregnant, or are breastfeeding. This is very important. What are the risks? Generally, this is a safe procedure. However, problems may occur, including:  An allergic reaction to the tracer. This is rare.  Pain and redness at the IV site.  Possible harm to your baby if you are pregnant or breastfeeding.  Being exposed to too much radiation over a  lifetime can increase the risk of cancer. This risk is small, but it may occur if you have many exposures throughout your life. What happens before the procedure?  Ask your health care provider about changing or stopping your regular medicines. This is especially important if you are taking diabetes medicines or blood thinners.  Ask your health care provider if you will be having an exercise scan with your MUGA scan.  Follow instructions from your health care provider about eating or drinking restrictions. ? Starting 4 hours before your scan, do not drink any beverages that contain caffeine, including coffee, tea, and soda. ? If you will be having an exercise scan with your MUGA scan, do not eat or drink anything except water starting 4 hours before your scan.  Wear loose, comfortable clothing. What happens during the procedure?  You will be asked to lie down on an exam table.  An IV will be inserted into one of your veins.  Sticky pads (electrodes) will be placed on your chest, arms, and legs.  Wires will be placed on the electrodes to connect them to a machine.  A small amount of tracer will be injected through your IV. You may feel a cold sensation in your arm as the tracer enters your bloodstream.  A camera will be placed over your chest. It will take a series of pictures while you lie still.  If you are also having an exercise scan, you will walk on a treadmill or ride a stationary bicycle. Then you will be asked to lie back down on the exam table for more pictures.  After all of the pictures have been taken, the electrodes and IV will be removed. The procedure may vary among health care providers and hospitals. What happens after the procedure?  You will need to drink enough fluid to keep your urine pale yellow. This helps to flush the tracer out of your body.  It is up to you to get your test results. Ask your health care provider, or the department that is doing the test,  when your results will be ready. Summary  A MUGA scan (multigated acquisition scan) is a test that creates images of your heart at specific times while it beats. The scan shows how well your heart is pumping blood to the rest of your body.  A number of different conditions can lead to your heart having a decreased pumping ability.  It is up to you to get your test results. Ask your health care provider, or the department that is doing the test, when your results will be ready. This information is not intended to replace advice given to you by your health care provider. Make sure you discuss any questions you have with your health care provider. Document Revised: 07/19/2017 Document Reviewed: 05/09/2017 Elsevier Patient Education  2020 [employer].

## 2020-06-27 ENCOUNTER — Other Ambulatory Visit: Payer: Self-pay

## 2020-06-27 ENCOUNTER — Ambulatory Visit
Admission: RE | Admit: 2020-06-27 | Discharge: 2020-06-27 | Disposition: A | Discharge status: Home / Self Care | Payer: BC Managed Care – PPO | Source: Ambulatory Visit | Attending: Sports Medicine | Admitting: Sports Medicine

## 2020-06-27 ENCOUNTER — Other Ambulatory Visit: Payer: Self-pay | Admitting: General Surgery

## 2020-06-27 DIAGNOSIS — M7542 Impingement syndrome of left shoulder: Secondary | ICD-10-CM | POA: Insufficient documentation

## 2020-06-27 DIAGNOSIS — M25512 Pain in left shoulder: Secondary | ICD-10-CM

## 2020-06-27 DIAGNOSIS — M7552 Bursitis of left shoulder: Secondary | ICD-10-CM | POA: Diagnosis present

## 2020-06-27 DIAGNOSIS — Z1231 Encounter for screening mammogram for malignant neoplasm of breast: Secondary | ICD-10-CM

## 2020-06-27 NOTE — Progress Notes (Signed)
Heather Bell, Heather Bell Heather Bell, Heather Bell [address] Phone: [phone]  Fax: [phone]   Clinic Day:  06/28/2020  Referring physician: [facility] [place]  Chief Complaint: Heather Bell is a 44 y.o. 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  HPI: The patient was last seen in the medical oncology clinic on 04/05/2020. At that time, she was doing well.  She was recovering from surgery. She continued to drink a small amount of fluids. Hematocrit was 29.2, hemoglobin 9.5, MCV 96.7, platelets 346,000, WBC 7,100. Calcium was 8.6. Bilirubin was 0.2. CA27.29 was 8.7. She continued oral B12.  She underwent irrigation and debridement of wound with removal of right tissue expander on 05/02/2020 by Dr. Claudia Desanctis. Pathology was benign.  The patient saw Dr. Claudia Desanctis on 06/01/2020. She felt that she was doing well. On exam, her incision has healing well and all sutures were removed.  The patient saw Dr. Saunders Revel on 06/15/2020. She did not report any signs or symptoms of heart failure. She appeared euvolemic on exam. She continued her current medications with plans for repeat MUGA scan in 01/2021.  During the interim, she has felt "ok".  She notes that her energy level is not great as she tires quickly.  She is drinking fluids "ok".  She has occasional nausea.  She denies any melena or hematochezia.  Heartburn is controlled.  She denies any breast concerns.  She plans to wait 5-6 months before consideration of additional surgery.   Past Medical History:  Diagnosis Date  . Allergic rhinitis   . Anxiety   . Bipolar disorder ([place])    depression  . Cancer [facility])    breast right  . Depression   . Diabetes ([place])   . GERD (gastroesophageal reflux disease)   . Hyperlipidemia     Past Surgical History:  Procedure Laterality Date  . BREAST BIOPSY Right 07/22/2019   mass bx, path pending, heart  marker  . BREAST BIOPSY Right 07/22/2019   LN bx, path pending,  butterfly hydromarker  . BREAST RECONSTRUCTION WITH PLACEMENT OF TISSUE EXPANDER AND FLEX HD (ACELLULAR HYDRATED DERMIS) Right 03/21/2020   Procedure: RIGHT BREAST RECONSTRUCTION WITH PLACEMENT OF TISSUE EXPANDER AND FLEX HD (ACELLULAR HYDRATED DERMIS);  Surgeon: [name], MD;  Location: [facility];  Service: Plastics;  Laterality: Right;  . IMAGE GUIDED SINUS SURGERY    . INCISION AND DRAINAGE OF WOUND Right 05/02/2020   Procedure: IRRIGATION AND DEBRIDEMENT WOUND WITH REMOVAL OF RIGHT TISSUE EXPANDER;  Surgeon: [name], MD;  Location: [place];  Service: Plastics;  Laterality: Right;  1 hour please  . KNEE SURGERY    . NASAL SEPTUM SURGERY    . PORTACATH PLACEMENT N/A 08/07/2019   Procedure: INSERTION PORT-A-CATH;  Surgeon: Herbert Pun, MD;  Location: [facility];  Service: General;  Laterality: N/A;  . TONSILLECTOMY    . TOTAL MASTECTOMY Right 03/21/2020   Procedure: TOTAL MASTECTOMY;  Surgeon: Herbert Pun, MD;  Location: [facility];  Service: General;  Laterality: Right;    Family History  Problem Relation Age of Onset  . Lung cancer Father   . Heart attack Father   . Lung cancer Paternal Uncle   . Heart disease Paternal Uncle   . Lung cancer Paternal Aunt     Social History:  reports that she has never smoked. She has never used smokeless tobacco. She reports that she does  not drink alcohol and does not use drugs. Deniesany exposure to radiation or toxins.She has 2 children (18 year old daughter and 61 year old son).She use to work inthefast foodindustry. She is currently not working. Her husbandisMichael.Michaelused totravel a lot for his job.Her husband found a new job closer to home in [place], [place]. The patient is alone today.   Allergies:  Allergies  Allergen Reactions  . Compazine [Prochlorperazine Edisylate] Other (See Comments)    Acute dystonic reaction  .  Morphine And Related Shortness Of Breath  . Doxycycline Other (See Comments)    "made all my symptoms worse"   . Fluticasone Other (See Comments)    Doesn't work  . Levofloxacin Other (See Comments)    "made all my symptoms worse"     Current Medications: Current Outpatient Medications  Medication Sig Dispense Refill  . amphetamine-dextroamphetamine (ADDERALL XR) 25 MG 24 hr capsule Take 25 mg by mouth daily.    . Calcium Carb-Cholecalciferol (CALCIUM 600+D) 600-800 MG-UNIT TABS Take 1 tablet by mouth daily.    . cetirizine (ZYRTEC) 10 MG tablet Take 10 mg by mouth daily.     . diazepam (VALIUM) 10 MG tablet Take 10 mg by mouth 2 (two) times daily.    Marland Kitchen doxepin (SINEQUAN) 25 MG capsule Take 25-50 mg by mouth at bedtime.     Marland Kitchen HYDROcodone-acetaminophen (NORCO/VICODIN) 5-325 MG tablet as needed.    Marland Kitchen ibuprofen (ADVIL) 200 MG tablet Take 400-800 mg by mouth every 6 (six) hours as needed for headache or moderate pain.    Marland Kitchen lamoTRIgine (LAMICTAL) 200 MG tablet Take 200 mg by mouth at bedtime.     . lidocaine-prilocaine (EMLA) cream Apply topically over port-a-cath 30 minutes to 1 hour prior to treatment. 30 g 1  . lurasidone (LATUDA) 40 MG TABS tablet Take 40 mg by mouth at bedtime.     . Lurasidone HCl 60 MG TABS Take 60 mg by mouth at bedtime.     . metFORMIN (GLUCOPHAGE-XR) 500 MG 24 hr tablet Take 1,000 mg by mouth 2 (two) times daily.    . mometasone (NASONEX) 50 MCG/ACT nasal spray Place 2 sprays into the nose daily as needed (allergies).     . Multiple Vitamin (MULTIVITAMIN WITH MINERALS) TABS tablet Take 1 tablet by mouth daily.    Marland Kitchen omeprazole (PRILOSEC) 20 MG capsule Take 20 mg by mouth daily before breakfast.     . oxyCODONE-acetaminophen (PERCOCET) 7.5-325 MG tablet Take by mouth.    . QUEtiapine (SEROQUEL) 300 MG tablet Take 300 mg by mouth at bedtime.    . simvastatin (ZOCOR) 20 MG tablet Take 20 mg by mouth daily.    Marland Kitchen topiramate (TOPAMAX) 100 MG tablet Take 50-100 mg by  mouth See admin instructions. Take 1 tablet (100 mg) by mouth in the morning & take 0.5 tablet (50 mg) by mouth at night.    . vitamin B-12 (CYANOCOBALAMIN) 1000 MCG tablet Take 1,000 mcg by mouth daily.    . cyclobenzaprine (FLEXERIL) 5 MG tablet Take 1 tablet (5 mg total) by mouth 3 (three) times daily as needed for muscle spasms. (Patient not taking: Reported on 06/28/2020) 30 tablet 0  . gabapentin (NEURONTIN) 300 MG capsule Take 1 capsule (300 mg total) by mouth 3 (three) times daily for 20 days. (Patient not taking: Reported on 06/28/2020) 60 capsule 0  . LORazepam (ATIVAN) 0.5 MG tablet Take 0.5 mg by mouth every 8 (eight) hours as needed (nausea). (Patient not taking: Reported on  06/28/2020)     No current facility-administered medications for this visit.   Facility-Administered Medications Ordered in Other Visits  Medication Dose Route Frequency Provider Last Rate Last Admin  . 0.9 %  sodium chloride infusion   Intravenous Continuous Lequita Asal, MD   Stopped at 01/08/20 1516  . sodium chloride flush (NS) 0.9 % injection 10 mL  10 mL Intravenous PRN Nolon Stalls C, MD   10 mL at 01/08/20 1314    Review of Systems  Constitutional: Positive for weight loss (6 lbs). Negative for chills, diaphoresis, fever and malaise/fatigue.       Feels "ok".  HENT: Negative.  Negative for congestion, ear discharge, ear pain, hearing loss, nosebleeds, sinus pain, sore throat and tinnitus.   Eyes: Negative.  Negative for blurred vision, double vision and photophobia.  Respiratory: Negative.  Negative for cough, hemoptysis, sputum production and shortness of breath.   Cardiovascular: Negative.  Negative for chest pain, palpitations, orthopnea and leg swelling.  Gastrointestinal: Positive for nausea. Negative for abdominal pain, blood in stool, constipation, diarrhea, heartburn (controlled), melena and vomiting.       Appetite is down a little.  Genitourinary: Negative.  Negative for dysuria,  frequency, hematuria and urgency.  Musculoskeletal: Negative.  Negative for back pain, joint pain, myalgias and neck pain.  Skin: Negative.  Negative for itching and rash.  Neurological: Negative.  Negative for dizziness, tingling, sensory change, speech change, focal weakness, weakness and headaches.  Endo/Heme/Allergies: Does not bruise/bleed easily.       Type II diabetes.   Psychiatric/Behavioral: Negative.  Negative for depression and memory loss. The patient is not nervous/anxious and does not have insomnia.   All other systems reviewed and are negative.  Performance status (ECOG):  1  Vitals Blood pressure 99/68, pulse 99, temperature 97.8 F (36.6 C), temperature source Tympanic, resp. rate 18, height '5\' 6"'  (1.676 m), weight 141 lb 8.6 oz (64.2 kg), SpO2 99 %.   Physical Exam Vitals and nursing note reviewed.  Constitutional:      General: She is not in acute distress.    Appearance: Normal appearance. She is well-developed. She is not ill-appearing or diaphoretic.  HENT:     Head: Normocephalic and atraumatic.     Comments: Scarf.    Mouth/Throat:     Mouth: Mucous membranes are moist.     Pharynx: Oropharynx is clear.  Eyes:     General: No scleral icterus.    Extraocular Movements: Extraocular movements intact.     Conjunctiva/sclera: Conjunctivae normal.     Pupils: Pupils are equal, round, and reactive to light.     Comments: Brown eyes.  Cardiovascular:     Rate and Rhythm: Normal rate and regular rhythm.     Heart sounds: Normal heart sounds. No murmur heard.   Pulmonary:     Effort: Pulmonary effort is normal. No respiratory distress.     Breath sounds: Normal breath sounds. No wheezing, rhonchi or rales.  Chest:     Chest wall: No tenderness.     Comments: Right sided incision without erythema or nodularity. Abdominal:     General: Bowel sounds are normal. There is no distension.     Palpations: Abdomen is soft. There is no hepatomegaly, splenomegaly or  mass.     Tenderness: There is no guarding or rebound.  Musculoskeletal:        General: No swelling or tenderness. Normal range of motion.     Cervical back: Normal range of  motion and neck supple.  Lymphadenopathy:     Head:     Right side of head: No preauricular, posterior auricular or occipital adenopathy.     Left side of head: No preauricular, posterior auricular or occipital adenopathy.     Cervical: No cervical adenopathy.     Upper Body:     Right upper body: No supraclavicular or axillary adenopathy.     Left upper body: No supraclavicular or axillary adenopathy.     Lower Body: No right inguinal adenopathy. No left inguinal adenopathy.  Skin:    General: Skin is warm and dry.  Neurological:     Mental Status: She is alert and oriented to person, place, and time. Mental status is at baseline.  Psychiatric:        Mood and Affect: Mood normal.        Behavior: Behavior normal.        Thought Content: Thought content normal.        Judgment: Judgment normal.    No visits with results within 3 Day(s) from this visit.  Latest known visit with results is:  Admission on 05/02/2020, Discharged on 05/02/2020  Component Date Value Ref Range Status  . Glucose-Capillary 05/02/2020 79  70 - 99 mg/dL Final   Glucose reference range applies only to samples taken after fasting for at least 8 hours.  . Preg Test, Ur 05/02/2020 NEGATIVE  NEGATIVE Final   Comment:        THE SENSITIVITY OF THIS METHODOLOGY IS >24 mIU/mL   . SURGICAL PATHOLOGY 05/02/2020    Final-Edited                   Value:SURGICAL PATHOLOGY CASE: [id number] PATIENT: Heather Bell Surgical Pathology Report  Clinical History: Right breast cancer metastasized to axillary lymph node, right breast wound (nt)  FINAL MICROSCOPIC DIAGNOSIS:  A. SOFT TISSUE, RIGHT BREAST, EXCISION: - Benign skin and soft tissue with ulcer and acute inflammation.  GROSS DESCRIPTION:  The specimen is received in formalin and  consists of a 5.0 x 3.4 x 1.2 cm aggregate of tan possible skin and underlying tan-pink to white soft tissue.  There is an embedded clear suture material identified. Representative sections are submitted in 1 cassette. Heather Bell 05/03/2020)  Final Diagnosis performed by Vicente Males, MD.   Electronically signed 05/03/2020 Technical component performed at [employer]. [facility], [place] [address], [place], [place] [address].  Professional component performed at [facility], [place] [address], [place], [place] [address].  Immunohistochemistry Technical comp                         onent (if applicable) was performed at [facility]. [address], [place], [place], [place] [address].   IMMUNOHISTOCHEMISTRY DISCLAIMER (if applicable): Some of these immunohistochemical stains may have been developed and the performance characteristics determine by [facility]. Some may not have been cleared or approved by the U.S. Food and Drug Administration. The FDA has determined that such clearance or approval is not necessary. This test is used for clinical purposes. It should not be regarded as investigational or for research. This laboratory is certified under the [employer] (CLIA-88) as qualified to perform high complexity clinical laboratory testing.  The controls stained appropriately.   Marland Kitchen Specimen Description 05/02/2020 WOUND RIGHT BREAST   Final  . Special Requests 05/02/2020 NONE   Final  . Gram Stain  05/02/2020    Final                   Value:RARE WBC PRESENT, PREDOMINANTLY PMN NO ORGANISMS SEEN   . Culture 05/02/2020    Final                   Value:RARE KLEBSIELLA SPECIES NO ANAEROBES ISOLATED Performed at [facility], [place] [address], [place], [place] [address]   . Report Status 05/02/2020 05/07/2020 FINAL   Final  . Organism ID, Bacteria 05/02/2020 KLEBSIELLA SPECIES   Final  .  Glucose-Capillary 05/02/2020 92  70 - 99 mg/dL Final   Glucose reference range applies only to samples taken after fasting for at least 8 hours.    Assessment:  LASHANTE FRYBERGER is a 44 y.o. female with clinical T1cN1triple negativerightbreast cancers/p neoadjuvant chemotherapy followed by right total mastectomy, sentinel lymph node biopsy, and reconstruction on 03/21/2020.  Breast pathology was negative for carcinoma. There was duct ectasia and stromal fibrosis.  Sentinel lymph node and an additional lymph node were negative for carcinoma. Post treatment classification was [id number].  She initially underwent ultrasound guided core biopsy on 07/22/2019. Pathology revealeda grade IIIinvasive mammary carcinoma, no special type of the right breast. Specimen was 10 mm.Ductal carcinoma in situ(DCIS)was not identified.Right axillarylymph nodewas positive for metastatic carcinoma.Tumor was ER - (<1%), PR - (<1%), and Her2/neu -.  Bilateral mammogram and right unilateral ultrasoundon 07/10/2019 revealed a 1.7 x 1.6 x 1.6 cmmass at the7 o'clock region of the right breast 7 cm from the nipple. There was an abnormal1.4 x 0.7 x 0.9 cmaxillary lymph node. Therewas a second3 mmaxillary lymph node with minimal focal cortical thickening.An ultrasound-guided core biopsies of the mass and the enlarged right axillary lymph node was recommended. BI-RADS category 5.  PET scanon 08/11/2019 showed a hypermetabolic mass in the inferior RIGHT breast consistentwith herbreast carcinoma. There was noevidence hypermetabolic nodal metastasis.There was no distant metastatic disease.There was nopulmonary metastasis or skeletal metastasis.  [place] 12/24/2020revealedan ejection fraction of50 to 55%. The left ventricle demonstrated global hypokinesis.MUGAon 08/24/2019 revealed an EF of 60%.MUGAon 12/08/2019 revealed an EF of57.4%.  MUGA on 01/22/2020 revealed an EF of 56.5%.  She is followed  by Dr End.  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  Shereceived 12 weeksneoadjuvant Taxol + carboplatin(08/25/2019 -11/24/2019) and 4 cycles of AC (12/15/2019 - 02/16/2020) with Fulphila (pegfilgrastim-jmdb) support.  She has required IVF support.  CA27.29 has been followed: 8.5 on 07/30/2019 and 8.7 on 04/05/2020.  She underwent irrigation and debridement of wound with removal of right tissue expander on 05/02/2020.  She is premenopausal. She stopped taking birth control pills on 08/17/2019.  She has iron deficiency. Ferritin was 21 with an iron saturation of 7% and a TIBC 343 on 09/08/2019.She received Venoferweekly x 3 (09/18/2019 - 10/06/2019).  She has B12 deficiency. B12 was 240 on 01/19/2021and 680 on 11/24/2019.She is on oral B12.  Folate was 62.7 on 09/08/2019.  She has recurrent orthostatic hypotension. Fluid intake is marginal. AM cortisol was 5.3 (low) on 09/22/2019 suggestive of adrenal insufficiency.ACTH stim testwas negative.  Cortisol was 9.0 on 12/24/2019.   Symptomatically, she is doing well.  Plan: 1.    Labs today: CBC with diff, CMP, CA27.29, ferritin, iron studies, B12, folate. 2. Clinical stageIIB triple negativeright breast cancer  She received neoadjuvant chemotherapy followed by right total mastectomy and sentinel lymph node biopsy.  Pathology revealed  a complete pathologic remission.  She is s/p removal of right tissue expander.  Clinically, she is doing well.  Exam is unremarkable.  Left mammogram is scheduled for 08/02/2020. 3. Normocytic anemia Hematocrit 29.2, hemoglobin9.5, MCV96.7 on 04/05/2020. Hematocrit 34.3, hemoglobin 11.2 and MCV 88.2 on 06/28/2020.    Ferritin 28 with an iron saturation of 17% and a TIBC of 322 today. B12 1307 and folate 66 today.  Anticipate continued  improvement. 4. B12 deficiency Patient is on oral B12.  B12 was 240 on 01/19/2021and 1307 today.  Folate 66 today.  Monitor annually. 5.Chronic dehydration Cortisol level was 9.0 (normal) on 12/24/2019.  Patient is doing better with fluids. 6.   RTC every 6-12 weeks for port-a-cath flush. 7.   RTC in 3 months for MD assessment, labs (CBC with diff, CMP, CA27.29), review mammogram, and port flush  I discussed the assessment and treatment plan with the patient.  The patient was provided an opportunity to ask questions and all were answered.  The patient agreed with the plan and demonstrated an understanding of the instructions.  The patient was advised to call back if the symptoms worsen or if the condition fails to improve as anticipated.     Lequita Asal, MD, PhD    06/28/2020, 5:00 PM

## 2020-06-28 ENCOUNTER — Inpatient Hospital Stay: Payer: BC Managed Care – PPO | Attending: Hematology and Oncology | Admitting: Hematology and Oncology

## 2020-06-28 ENCOUNTER — Encounter: Payer: Self-pay | Admitting: Hematology and Oncology

## 2020-06-28 ENCOUNTER — Inpatient Hospital Stay: Payer: BC Managed Care – PPO

## 2020-06-28 VITALS — BP 99/68 | HR 99 | Temp 97.8°F | Resp 18 | Ht 66.0 in | Wt 141.5 lb

## 2020-06-28 DIAGNOSIS — R110 Nausea: Secondary | ICD-10-CM | POA: Insufficient documentation

## 2020-06-28 DIAGNOSIS — Z171 Estrogen receptor negative status [ER-]: Secondary | ICD-10-CM

## 2020-06-28 DIAGNOSIS — F419 Anxiety disorder, unspecified: Secondary | ICD-10-CM | POA: Insufficient documentation

## 2020-06-28 DIAGNOSIS — D649 Anemia, unspecified: Secondary | ICD-10-CM

## 2020-06-28 DIAGNOSIS — Z7984 Long term (current) use of oral hypoglycemic drugs: Secondary | ICD-10-CM | POA: Diagnosis not present

## 2020-06-28 DIAGNOSIS — E876 Hypokalemia: Secondary | ICD-10-CM

## 2020-06-28 DIAGNOSIS — E860 Dehydration: Secondary | ICD-10-CM

## 2020-06-28 DIAGNOSIS — Z452 Encounter for adjustment and management of vascular access device: Secondary | ICD-10-CM | POA: Insufficient documentation

## 2020-06-28 DIAGNOSIS — Z7951 Long term (current) use of inhaled steroids: Secondary | ICD-10-CM | POA: Diagnosis not present

## 2020-06-28 DIAGNOSIS — E538 Deficiency of other specified B group vitamins: Secondary | ICD-10-CM

## 2020-06-28 DIAGNOSIS — E119 Type 2 diabetes mellitus without complications: Secondary | ICD-10-CM | POA: Diagnosis not present

## 2020-06-28 DIAGNOSIS — Z9011 Acquired absence of right breast and nipple: Secondary | ICD-10-CM | POA: Diagnosis not present

## 2020-06-28 DIAGNOSIS — C50511 Malignant neoplasm of lower-outer quadrant of right female breast: Secondary | ICD-10-CM | POA: Diagnosis not present

## 2020-06-28 DIAGNOSIS — F319 Bipolar disorder, unspecified: Secondary | ICD-10-CM | POA: Diagnosis not present

## 2020-06-28 DIAGNOSIS — Z9221 Personal history of antineoplastic chemotherapy: Secondary | ICD-10-CM | POA: Insufficient documentation

## 2020-06-28 DIAGNOSIS — I951 Orthostatic hypotension: Secondary | ICD-10-CM | POA: Insufficient documentation

## 2020-06-28 DIAGNOSIS — Z95828 Presence of other vascular implants and grafts: Secondary | ICD-10-CM

## 2020-06-28 DIAGNOSIS — K219 Gastro-esophageal reflux disease without esophagitis: Secondary | ICD-10-CM | POA: Diagnosis not present

## 2020-06-28 DIAGNOSIS — E785 Hyperlipidemia, unspecified: Secondary | ICD-10-CM | POA: Diagnosis not present

## 2020-06-28 DIAGNOSIS — Z853 Personal history of malignant neoplasm of breast: Secondary | ICD-10-CM | POA: Diagnosis present

## 2020-06-28 DIAGNOSIS — Z79899 Other long term (current) drug therapy: Secondary | ICD-10-CM | POA: Insufficient documentation

## 2020-06-28 DIAGNOSIS — Z791 Long term (current) use of non-steroidal anti-inflammatories (NSAID): Secondary | ICD-10-CM | POA: Insufficient documentation

## 2020-06-28 DIAGNOSIS — R634 Abnormal weight loss: Secondary | ICD-10-CM | POA: Insufficient documentation

## 2020-06-28 LAB — COMPREHENSIVE METABOLIC PANEL
ALT: 11 U/L (ref 0–44)
AST: 16 U/L (ref 15–41)
Albumin: 4.2 g/dL (ref 3.5–5.0)
Alkaline Phosphatase: 54 U/L (ref 38–126)
Anion gap: 14 (ref 5–15)
BUN: 18 mg/dL (ref 6–20)
CO2: 21 mmol/L — ABNORMAL LOW (ref 22–32)
Calcium: 9.4 mg/dL (ref 8.9–10.3)
Chloride: 104 mmol/L (ref 98–111)
Creatinine, Ser: 0.69 mg/dL (ref 0.44–1.00)
GFR, Estimated: >60 mL/min (ref >60)
Glucose, Bld: 92 mg/dL (ref 70–99)
Potassium: 3.8 mmol/L (ref 3.5–5.1)
Sodium: 139 mmol/L (ref 135–145)
Total Bilirubin: 0.5 mg/dL (ref 0.3–1.2)
Total Protein: 7.2 g/dL (ref 6.5–8.1)

## 2020-06-28 LAB — CBC WITH DIFFERENTIAL/PLATELET
Abs Immature Granulocytes: 0.01 10*3/uL (ref 0.00–0.07)
Basophils Absolute: 0 10*3/uL (ref 0.0–0.1)
Basophils Relative: 1 %
Eosinophils Absolute: 0.2 10*3/uL (ref 0.0–0.5)
Eosinophils Relative: 4 %
HCT: 34.3 % — ABNORMAL LOW (ref 36.0–46.0)
Hemoglobin: 11.2 g/dL — ABNORMAL LOW (ref 12.0–15.0)
Immature Granulocytes: 0 %
Lymphocytes Relative: 39 %
Lymphs Abs: 1.9 10*3/uL (ref 0.7–4.0)
MCH: 28.8 pg (ref 26.0–34.0)
MCHC: 32.7 g/dL (ref 30.0–36.0)
MCV: 88.2 fL (ref 80.0–100.0)
Monocytes Absolute: 0.4 10*3/uL (ref 0.1–1.0)
Monocytes Relative: 7 %
Neutro Abs: 2.5 10*3/uL (ref 1.7–7.7)
Neutrophils Relative %: 49 %
Platelets: 270 10*3/uL (ref 150–400)
RBC: 3.89 MIL/uL (ref 3.87–5.11)
RDW: 12.1 % (ref 11.5–15.5)
WBC: 5 10*3/uL (ref 4.0–10.5)
nRBC: 0 % (ref 0.0–0.2)

## 2020-06-28 LAB — IRON AND TIBC
Iron: 54 ug/dL (ref 28–170)
Saturation Ratios: 17 % (ref 10.4–31.8)
TIBC: 322 ug/dL (ref 250–450)
UIBC: 268 ug/dL

## 2020-06-28 LAB — FOLATE: Folate: 66 ng/mL (ref >5.9)

## 2020-06-28 LAB — VITAMIN B12: Vitamin B-12: 1307 pg/mL — ABNORMAL HIGH (ref 180–914)

## 2020-06-28 LAB — FERRITIN: Ferritin: 28 ng/mL (ref 11–307)

## 2020-06-28 MED ORDER — SODIUM CHLORIDE 0.9% FLUSH
10.0000 mL | INTRAVENOUS | Status: DC | PRN
  Administered 2020-06-28: 10 mL via INTRAVENOUS
  Filled 2020-06-28: qty 10

## 2020-06-28 MED ORDER — HEPARIN SOD (PORK) LOCK FLUSH 100 UNIT/ML IV SOLN
500.0000 [IU] | Freq: Once | INTRAVENOUS | Status: AC
  Administered 2020-06-28: 500 [IU] via INTRAVENOUS
  Filled 2020-06-28: qty 5

## 2020-06-28 NOTE — Progress Notes (Signed)
The patient c/o trouble swallowing at times

## 2020-06-29 LAB — CANCER ANTIGEN 27.29: CA 27.29: 7.6 U/mL (ref 0.0–38.6)

## 2020-06-30 ENCOUNTER — Ambulatory Visit: Payer: BC Managed Care – PPO | Admitting: Plastic Surgery

## 2020-07-25 ENCOUNTER — Other Ambulatory Visit: Payer: Self-pay

## 2020-07-25 ENCOUNTER — Ambulatory Visit: Payer: BC Managed Care – PPO | Attending: Sports Medicine | Admitting: Physical Therapy

## 2020-07-25 ENCOUNTER — Encounter: Payer: Self-pay | Admitting: Physical Therapy

## 2020-07-25 DIAGNOSIS — M25512 Pain in left shoulder: Secondary | ICD-10-CM | POA: Insufficient documentation

## 2020-07-25 DIAGNOSIS — M25612 Stiffness of left shoulder, not elsewhere classified: Secondary | ICD-10-CM | POA: Diagnosis not present

## 2020-07-25 DIAGNOSIS — G8929 Other chronic pain: Secondary | ICD-10-CM | POA: Diagnosis present

## 2020-07-25 NOTE — Therapy (Signed)
[facility] [address] [place], [address] Phone: [phone]   Fax:  [phone]  Physical Therapy Evaluation  Patient Details  Name: Heather Bell MRN: [id number] Date of Birth: [birth year] No data recorded  Encounter Date: 07/25/2020   PT End of Session - 07/25/20 1154    Visit Number 1    Number of Visits 17    Date for PT Re-Evaluation 08/22/20    PT Start Time 1030    PT Stop Time 1125    PT Time Calculation (min) 55 min    Activity Tolerance Patient tolerated treatment well;Patient limited by pain    Behavior During Therapy [facility] for tasks assessed/performed           Past Medical History:  Diagnosis Date  . Allergic rhinitis   . Anxiety   . Bipolar disorder ([place])    depression  . Cancer [facility])    breast right  . Depression   . Diabetes ([place])   . GERD (gastroesophageal reflux disease)   . Hyperlipidemia     Past Surgical History:  Procedure Laterality Date  . BREAST BIOPSY Right 07/22/2019   mass bx, path pending, heart marker  . BREAST BIOPSY Right 07/22/2019   LN bx, path pending,  butterfly hydromarker  . BREAST RECONSTRUCTION WITH PLACEMENT OF TISSUE EXPANDER AND FLEX HD (ACELLULAR HYDRATED DERMIS) Right 03/21/2020   Procedure: RIGHT BREAST RECONSTRUCTION WITH PLACEMENT OF TISSUE EXPANDER AND FLEX HD (ACELLULAR HYDRATED DERMIS);  Surgeon: [name], MD;  Location: [facility];  Service: Plastics;  Laterality: Right;  . IMAGE GUIDED SINUS SURGERY    . INCISION AND DRAINAGE OF WOUND Right 05/02/2020   Procedure: IRRIGATION AND DEBRIDEMENT WOUND WITH REMOVAL OF RIGHT TISSUE EXPANDER;  Surgeon: [name], MD;  Location: [place];  Service: Plastics;  Laterality: Right;  1 hour please  . KNEE SURGERY    . NASAL SEPTUM SURGERY    . PORTACATH PLACEMENT N/A 08/07/2019   Procedure: INSERTION PORT-A-CATH;  Surgeon: Herbert Pun, MD;  Location: [facility];   Service: General;  Laterality: N/A;  . TONSILLECTOMY    . TOTAL MASTECTOMY Right 03/21/2020   Procedure: TOTAL MASTECTOMY;  Surgeon: Herbert Pun, MD;  Location: [facility];  Service: General;  Laterality: Right;    There were no vitals filed for this visit.    Subjective Assessment - 07/25/20 1137    Pertinent History Patient is a 44 year old female with L shoulder pain that began after a L port-a-cath was inserted last December for chemotherapy treatments for R breast cancer. Pt has concluded chemo treatments, but per onocologist is to keep port for a "little while longer". Has had R masectomy and R expander placement and removal (no current plans for reconstruction). Pt reports a steady increase in pain and loss of motion over time since the port was inserted. Pt has received cortisone shots in L shoulder for bursitis last July and stated it helps decrease pain for some time but did not last very [place]. Pt had an expander put in on R side post masectomy last year but the tissue died over it and is thinking about getting another one. Pt has difficulty and pain with all movements and states only rest and no movement makes her L shoulder feel better. Pt scales the pain a 10/10 at worst and 1/10 at best when not moving it. Pt is sleeping on her back but depending on  if she rolls over, her L shoulder can be painful in the AM. Patient denies any N/V, B&B, unexplained weight loss, night pain, fever, or night sweats.    Limitations Lifting;House hold activities    How V[place] can you sit comfortably? unlimited    How [place] can you stand comfortably? unlimited    How Hosier can you walk comfortably? unlimited    Patient Stated Goals decrease pain, be able to move LUE to PLOF    Currently in Pain? Yes    Pain Score 1     Pain Location Shoulder    Pain Orientation Left    Pain Descriptors / Indicators Constant;Aching;Sharp    Pain Type Chronic pain;Surgical pain    Pain Onset More than a month ago     Pain Frequency Constant    Aggravating Factors  any movement of L shoulder    Pain Relieving Factors rest    Effect of Pain on Daily Activities ADLs, household chores         Subjective Where is the pain located? L shoulder after port was put in last December, steady increase in pain. Loss of motion over time   Bursitis, cortisone shot last July  Expander put in but skin died over it, talked about putting back in   Pain scale? Current? 1/10 Best? 1/10 Worst? 10/10 Achy pain at all times   Working? Not working   What activities make it worse? Better? (aggravating/easing) Any movement at all causes pain  Not moving it   Worse in the AM/PM? Throughout the day?  Sleeping on back helps but depending on sleep position it can be painful in the morning   Numbness/Tingling? No   N/V, B&B, unexplained weight loss, night pain, fever, night sweats? Denies all    Hobbies- stays at home with kids  Goals- decrease pain, be able to move LUE    OBJECTIVE    Cervical Screen All WFL w/PROM no pain just slight stretching w/rotation and lateral bending   AROM: Ext/flex  Rotation R:  L:  Lateral bending R: L:   Repeated movement: No centralization or peripheralization with protraction or retraction  Elbow Screen Elbow AROM: WNL R/L   Palpation TTP on L upper trap, tightness in pec minor   Posture Forward head, rounded shoulders, protracted scapulae, heavy guarding, L shoulder hiking   Strength R/L *indicates w/pain Shoulder flexion 5/2+ Shoulder abduction 4+/2+* Shoulder external rotation 5/2+*  Shoulder internal rotation 5/3*  Shoulder extension 5/2+* Shoulder horizontal abduction 5/2+* Shoulder horizontal adduction 5/2+* Shoulder shrug (upper traps) 5 Elbow flexion 5/4* Elbow extension (triceps, radial n, C7) 5/4  Wrist Extension 5/5 Wrist Flexion 5/5    Prone  Y lower trap 5/2 T middle trap 5/2+    AROM (supine) R/L Shoulder flexion 180/109 Shoulder abduction  120/45* Shoulder ER  T2/base of skull*  Shoulder IR T9/S1**  Shoulder extension /unable to get in position    PROM R/L Shoulder flexion 180/115* Shoulder abduction 131*/57* Shoulder external rotation 90/unable to get in position Shoulder internal rotation 90/unable to get in position   End feels: empty end feels, painful with all movements   SPECIAL TESTS  Rotator Cuff  Drop Arm Test: negative Painful Arc (Pain from 60 to 120 degrees scaption): Positive  Infraspinatus Muscle Test: Positive    Subacromial Impingement Hawkins-Kennedy: positive  Neer (Block scapula, PROM flexion): positive  Painful Arc (Pain from 60 to 120 degrees scaption): positive  Empty Can (thumbs down and resist): positive  Belly test (subscap/IR): positive External Rotation Resistance: positive   Bicep Tendon Pathology Speed (shoulder flexion to 90, external rotation, full elbow extension, and forearm supination with resistance: positive  Yergason's (resisted shoulder ER and supination/biceps tendon pathology): positive     NEUROLOGICAL:   Mental Status Patient is oriented to person, place and time.  Recent memory is intact.  Remote memory is intact.  Attention span and concentration are intact.  Expressive speech is intact.  Patient's fund of knowledge is within normal limits for educational level.   Sensation Grossly intact to light touch bilateral UE as determined by testing dermatomes C2-T2 Proprioception and hot/cold testing deferred on this date  Therex  PT reviewed the following HEP with patient with patient able to demonstrate a set of the following with min cuing for correction needed. PT educated patient on parameters of therex (how/when to inc/decrease intensity, frequency, rep/set range, stretch hold time, and purpose of therex) with verbalized understanding.  IR towel AAROM 2x12 3sec holds  Pendelums 2x30  Pulleys AAROM     Objective measurements completed on examination: See  above findings.               PT Education - 07/25/20 1153    Education Details Pt educated on diagnosis, prognosis, anatomy, role of PT, therex form/technique for HEP given    Person(s) Educated Patient    Methods Explanation;Demonstration;Verbal cues    Comprehension Verbalized understanding;Returned demonstration;Verbal cues required            PT Short Term Goals - 07/25/20 1209      PT SHORT TERM GOAL #1   Title Patient will demonstrate independence with HEP to be able to decrease pain and improve L shoulder function    Baseline 07/25/20 HEP given    Time 4    Period Weeks    Status New    Target Date 08/22/20             PT L[place] Term Goals - 07/25/20 1212      PT [place] TERM GOAL #1   Title Patient will decrease worst pain as reported on NPRS scale by 3 points to demonstrate a clinically significant reduction in pain.    Baseline 07/25/20 10/10 at worst    Time 8    Period Weeks    Status New    Target Date 09/19/20      PT [place] TERM GOAL #2   Title Patient will increase L shoulder AROM to be [facility] in order to complete ADLs and household activities without pain or difficulty    Baseline 07/25/20 flexion 109, ER base of skull, IR S1, abd 45    Time 8    Period Weeks    Status New    Target Date 09/19/20      PT [place] TERM GOAL #3   Title Patient will increase L shoulder MMT strength to 4/5 to be able to raise arm overhead to complete household chores and return to PLOF    Baseline 07/25/20 flexion, abd, ER, ext 2+ IR 3    Time 8    Period Weeks    Status New    Target Date 09/19/20                  Plan - 07/25/20 1206    Clinical Impression Statement Pt is a 44 year old female presenting with L shoulder pain and stiffness following L port-a-cath insertion in December 2020. Patient has sign and symptoms  consistent with adhesive capsulitis. Patient with impairments with decreased L shoulder and scapular mobility, decreased periscapular and L  shoulder strength, muscle tightness in L upper trap and pec minor/major, abnormal posture, and hypersensitivity to pain. Pt has limitations with grooming and bathing, lifting overhead, pushing and pulling that limit her ability to fully participate in ADLs and household chores. Pt will benefit from skilled PT intervention to address impairments and return to PLOF.    Personal Factors and Comorbidities Age;Comorbidity 1;Past/Current Experience;Time since onset of injury/illness/exacerbation;Sex    Comorbidities R breast cancer    Examination-Activity Limitations Bathing;Sleep;Lift;Dressing;Reach Overhead;Hygiene/Grooming    Examination-Participation Restrictions Cleaning;Laundry;Yard Work;Community Activity    Stability/Clinical Decision Making Evolving/Moderate complexity    Clinical Decision Making Moderate    Rehab Potential Good    PT Frequency 2x / week    PT Duration 8 weeks    PT Treatment/Interventions ADLs/Self Care Home Management;Electrical Stimulation;Moist Heat;Iontophoresis 4mg /ml Dexamethasone;Traction;Ultrasound;Functional mobility training;Therapeutic activities;Therapeutic exercise;Neuromuscular re-education;Patient/family education;Manual techniques;Passive range of motion;Dry needling;Scar mobilization;Joint Manipulations    PT Next Visit Plan HEP review    PT Home Exercise Plan AAROM pulleys, IR towel stretch, pendelums    Consulted and Agree with Plan of Care Patient           Patient will benefit from skilled therapeutic intervention in order to improve the following deficits and impairments:  Decreased mobility, Hypomobility, Improper body mechanics, Impaired tone, Decreased range of motion, Decreased strength, Increased fascial restricitons, Impaired flexibility, Impaired UE functional use, Postural dysfunction, Pain  Visit Diagnosis: Stiffness of left shoulder, not elsewhere classified  Chronic left shoulder pain     Problem List Patient Active Problem List    Diagnosis Date Noted  . Breast cancer ([place]) 03/21/2020  . Anemia due to antineoplastic chemotherapy 02/15/2020  . Cardiomyopathy ([place]) 12/12/2019  . Dehydration 11/24/2019  . Acute pain of left shoulder 11/24/2019  . Orthostatic hypotension 10/12/2019  . Low serum cortisol level ([place]) 09/29/2019  . Malignant neoplasm of breast in female, estrogen receptor negative ([place]) 09/28/2019  . Hypokalemia 09/15/2019  . Hypocalcemia 09/15/2019  . Normocytic anemia 09/15/2019  . B12 deficiency 09/15/2019  . Iron deficiency anemia 09/15/2019  . Encounter for antineoplastic chemotherapy 08/25/2019  . Goals of care, counseling/discussion 08/17/2019  . Weight loss 07/31/2019  . Invasive carcinoma of breast ([place]) 07/30/2019  . H/O diarrhea 01/01/2017    Durwin Reges DPT [facility], SPT Durwin Reges 07/25/2020, 3:29 PM  [place] [facility] [address] [place], [address] Phone: [phone]   Fax:  [phone]  Name: Heather Bell MRN: [id number] Date of Birth: 1976-02-26

## 2020-07-27 ENCOUNTER — Encounter: Payer: Self-pay | Admitting: Physical Therapy

## 2020-07-27 ENCOUNTER — Encounter: Payer: BC Managed Care – PPO | Admitting: Physical Therapy

## 2020-07-27 ENCOUNTER — Ambulatory Visit: Payer: BC Managed Care – PPO | Admitting: Physical Therapy

## 2020-07-27 ENCOUNTER — Other Ambulatory Visit: Payer: Self-pay

## 2020-07-27 DIAGNOSIS — M25612 Stiffness of left shoulder, not elsewhere classified: Secondary | ICD-10-CM | POA: Diagnosis not present

## 2020-07-27 DIAGNOSIS — G8929 Other chronic pain: Secondary | ICD-10-CM

## 2020-07-27 NOTE — Therapy (Signed)
[facility] [address], [place], [address] Phone: [phone]   Fax:  [phone]  Physical Therapy Treatment  Patient Details  Name: Heather Bell MRN: [id number] Date of Birth: [birth year] No data recorded  Encounter Date: 07/27/2020   PT End of Session - 07/27/20 1304    Visit Number 2    Number of Visits 17    Date for PT Re-Evaluation 08/22/20    PT Start Time 7615    PT Stop Time 1202    PT Time Calculation (min) 38 min    Activity Tolerance Patient tolerated treatment well;Patient limited by pain    Behavior During Therapy [facility] for tasks assessed/performed           Past Medical History:  Diagnosis Date  . Allergic rhinitis   . Anxiety   . Bipolar disorder ([place])    depression  . Cancer [facility])    breast right  . Depression   . Diabetes ([place])   . GERD (gastroesophageal reflux disease)   . Hyperlipidemia     Past Surgical History:  Procedure Laterality Date  . BREAST BIOPSY Right 07/22/2019   mass bx, path pending, heart marker  . BREAST BIOPSY Right 07/22/2019   LN bx, path pending,  butterfly hydromarker  . BREAST RECONSTRUCTION WITH PLACEMENT OF TISSUE EXPANDER AND FLEX HD (ACELLULAR HYDRATED DERMIS) Right 03/21/2020   Procedure: RIGHT BREAST RECONSTRUCTION WITH PLACEMENT OF TISSUE EXPANDER AND FLEX HD (ACELLULAR HYDRATED DERMIS);  Surgeon: [name], MD;  Location: [facility];  Service: Plastics;  Laterality: Right;  . IMAGE GUIDED SINUS SURGERY    . INCISION AND DRAINAGE OF WOUND Right 05/02/2020   Procedure: IRRIGATION AND DEBRIDEMENT WOUND WITH REMOVAL OF RIGHT TISSUE EXPANDER;  Surgeon: [name], MD;  Location: [place];  Service: Plastics;  Laterality: Right;  1 hour please  . KNEE SURGERY    . NASAL SEPTUM SURGERY    . PORTACATH PLACEMENT N/A 08/07/2019   Procedure: INSERTION PORT-A-CATH;  Surgeon: Herbert Pun, MD;  Location: [facility];  Service:  General;  Laterality: N/A;  . TONSILLECTOMY    . TOTAL MASTECTOMY Right 03/21/2020   Procedure: TOTAL MASTECTOMY;  Surgeon: Herbert Pun, MD;  Location: [facility];  Service: General;  Laterality: Right;    There were no vitals filed for this visit.   Subjective Assessment - 07/27/20 1127    Subjective Patient reports 2/10 pain and increased soreness following last visit and has not been completeing HEP due to other personal issues going on this week.    Pertinent History Patient is a 44 year old female with L shoulder pain that began after a L port-a-cath was inserted last December for chemotherapy treatments for R breast cancer. Pt has concluded chemo treatments, but per onocologist is to keep port for a "little while longer". Has had R masectomy and R expander placement and removal (no current plans for reconstruction). Pt reports a steady increase in pain and loss of motion over time since the port was inserted. Pt has received cortisone shots in L shoulder for bursitis last July and stated it helps decrease pain for some time but did not last very [place]. Pt had an expander put in on R side post masectomy last year but the tissue died over it and is thinking about getting another one. Pt has difficulty and pain with all movements and states only rest and no movement makes her L  shoulder feel better. Pt scales the pain a 10/10 at worst and 1/10 at best when not moving it. Pt is sleeping on her back but depending on if she rolls over, her L shoulder can be painful in the AM. Patient denies any N/V, B&B, unexplained weight loss, night pain, fever, or night sweats.    Limitations Lifting;House hold activities    How [place] can you sit comfortably? unlimited    How [place] can you stand comfortably? unlimited    How [place] can you walk comfortably? unlimited    Patient Stated Goals decrease pain, be able to move LUE to PLOF    Currently in Pain? Yes    Pain Score 2     Pain Location Shoulder    Pain  Orientation Left    Pain Descriptors / Indicators Aching;Constant;Sharp    Pain Type Chronic pain;Surgical pain    Pain Onset More than a month ago    Pain Frequency Constant          Ther-Ex Pulleys flex x12 abd x12 with cuing for scapulohumeral rhythm and cues to keep cervical in neutral   Supine AAROM flex x15 2-3sec hold with dowel   Low pec stretch standing in doorway 1x30sec   Supine AAROM with cane abd x15 2-3sec holdwith cuing to maintain back of hand to mat table  Supine AAROM ER/ IR x15 2-3sec with arm abducted as far as possible without pain   Seated ER stretch 1x15 2-3sec holds   Standing IR stretch with towel 1x15 2-3sec hold   Manual  PROM all directions G1-2 mobs with flex/abd   G3 MWM for flexion  Pt guarded throughout all PROM especially with ER and abd                         PT Education - 07/27/20 1304    Education Details therex form/technique    Person(s) Educated Patient    Methods Explanation;Demonstration;Verbal cues    Comprehension Verbalized understanding;Returned demonstration;Verbal cues required            PT Short Term Goals - 07/25/20 1209      PT SHORT TERM GOAL #1   Title Patient will demonstrate independence with HEP to be able to decrease pain and improve L shoulder function    Baseline 07/25/20 HEP given    Time 4    Period Weeks    Status New    Target Date 08/22/20             PT [place] Term Goals - 07/25/20 1212      PT [place] TERM GOAL #1   Title Patient will decrease worst pain as reported on NPRS scale by 3 points to demonstrate a clinically significant reduction in pain.    Baseline 07/25/20 10/10 at worst    Time 8    Period Weeks    Status New    Target Date 09/19/20      PT [place] TERM GOAL #2   Title Patient will increase L shoulder AROM to be [facility] in order to complete ADLs and household activities without pain or difficulty    Baseline 07/25/20 flexion 109, ER base of skull, IR S1, abd 45     Time 8    Period Weeks    Status New    Target Date 09/19/20      PT [place] TERM GOAL #3   Title Patient will increase L shoulder MMT strength to  4/5 to be able to raise arm overhead to complete household chores and return to PLOF    Baseline 07/25/20 flexion, abd, ER, ext 2+ IR 3    Time 8    Period Weeks    Status New    Target Date 09/19/20                 Plan - 07/27/20 1314    Clinical Impression Statement Patient returns to PT with complaints of soreness in L shoulder and continues to have difficulty with all movements. PT session focused on increasing mobility and ROM of L shoulder with AAROM exercises. Pt is able to tolerate all therex without an increase in pain and able to demonstrate movement into further ranges with more reps. Pt continues to have the most difficulty and pain with movements into ER and abduction. PT will progress as able.    Personal Factors and Comorbidities Age;Comorbidity 1;Past/Current Experience;Time since onset of injury/illness/exacerbation;Sex    Comorbidities R breast cancer    Examination-Activity Limitations Bathing;Sleep;Lift;Dressing;Reach Overhead;Hygiene/Grooming    Examination-Participation Restrictions Cleaning;Laundry;Yard Work;Community Activity    Stability/Clinical Decision Making Evolving/Moderate complexity    Clinical Decision Making Moderate    Rehab Potential Good    PT Frequency 2x / week    PT Duration 8 weeks    PT Treatment/Interventions ADLs/Self Care Home Management;Electrical Stimulation;Moist Heat;Iontophoresis 4mg /ml Dexamethasone;Traction;Ultrasound;Functional mobility training;Therapeutic activities;Therapeutic exercise;Neuromuscular re-education;Patient/family education;Manual techniques;Passive range of motion;Dry needling;Scar mobilization;Joint Manipulations    PT Next Visit Plan HEP review    PT Home Exercise Plan AAROM pulleys, IR towel stretch, pendelums    Consulted and Agree with Plan of Care Patient            Patient will benefit from skilled therapeutic intervention in order to improve the following deficits and impairments:  Decreased mobility, Hypomobility, Improper body mechanics, Impaired tone, Decreased range of motion, Decreased strength, Increased fascial restricitons, Impaired flexibility, Impaired UE functional use, Postural dysfunction, Pain  Visit Diagnosis: Stiffness of left shoulder, not elsewhere classified  Chronic left shoulder pain     Problem List Patient Active Problem List   Diagnosis Date Noted  . Breast cancer ([place]) 03/21/2020  . Anemia due to antineoplastic chemotherapy 02/15/2020  . Cardiomyopathy ([place]) 12/12/2019  . Dehydration 11/24/2019  . Acute pain of left shoulder 11/24/2019  . Orthostatic hypotension 10/12/2019  . Low serum cortisol level ([place]) 09/29/2019  . Malignant neoplasm of breast in female, estrogen receptor negative ([place]) 09/28/2019  . Hypokalemia 09/15/2019  . Hypocalcemia 09/15/2019  . Normocytic anemia 09/15/2019  . B12 deficiency 09/15/2019  . Iron deficiency anemia 09/15/2019  . Encounter for antineoplastic chemotherapy 08/25/2019  . Goals of care, counseling/discussion 08/17/2019  . Weight loss 07/31/2019  . Invasive carcinoma of breast ([place]) 07/30/2019  . H/O diarrhea 01/01/2017    Durwin Reges DPT [facility], SPT Durwin Reges 07/27/2020, 1:58 PM  [facility] [address] [place], [address] Phone: [phone]   Fax:  [phone]  Name: Heather Bell MRN: [id number] Date of Birth: 1976-05-13

## 2020-08-01 ENCOUNTER — Ambulatory Visit: Payer: BC Managed Care – PPO | Admitting: Physical Therapy

## 2020-08-02 ENCOUNTER — Encounter: Payer: Self-pay | Admitting: Physical Therapy

## 2020-08-02 ENCOUNTER — Other Ambulatory Visit: Payer: Self-pay

## 2020-08-02 ENCOUNTER — Ambulatory Visit: Payer: BC Managed Care – PPO | Admitting: Physical Therapy

## 2020-08-02 DIAGNOSIS — M25512 Pain in left shoulder: Secondary | ICD-10-CM

## 2020-08-02 DIAGNOSIS — M25612 Stiffness of left shoulder, not elsewhere classified: Secondary | ICD-10-CM

## 2020-08-02 NOTE — Therapy (Signed)
[facility] [address], [place], [address] Phone: [phone]   Fax:  [phone]  Physical Therapy Treatment  Patient Details  Name: Heather Bell MRN: [id number] Date of Birth: 06-Jan-1976 No data recorded  Encounter Date: 08/02/2020   PT End of Session - 08/02/20 0952    Visit Number 3    Number of Visits 17    Date for PT Re-Evaluation 08/22/20    PT Start Time 0945    PT Stop Time 1025    PT Time Calculation (min) 40 min    Activity Tolerance Patient tolerated treatment well;Patient limited by pain    Behavior During Therapy WFL for tasks assessed/performed           Past Medical History:  Diagnosis Date   Allergic rhinitis    Anxiety    Bipolar disorder ([place])    depression   Cancer ([place])    breast right   Depression    Diabetes ([place])    GERD (gastroesophageal reflux disease)    Hyperlipidemia     Past Surgical History:  Procedure Laterality Date   BREAST BIOPSY Right 07/22/2019   mass bx, path pending, heart marker   BREAST BIOPSY Right 07/22/2019   LN bx, path pending,  butterfly hydromarker   BREAST RECONSTRUCTION WITH PLACEMENT OF TISSUE EXPANDER AND FLEX HD (ACELLULAR HYDRATED DERMIS) Right 03/21/2020   Procedure: RIGHT BREAST RECONSTRUCTION WITH PLACEMENT OF TISSUE EXPANDER AND FLEX HD (ACELLULAR HYDRATED DERMIS);  Surgeon: [name], MD;  Location: [facility];  Service: Plastics;  Laterality: Right;   IMAGE GUIDED SINUS SURGERY     INCISION AND DRAINAGE OF WOUND Right 05/02/2020   Procedure: IRRIGATION AND DEBRIDEMENT WOUND WITH REMOVAL OF RIGHT TISSUE EXPANDER;  Surgeon: [name], MD;  Location: [facility];  Service: Plastics;  Laterality: Right;  1 hour please   KNEE SURGERY     NASAL SEPTUM SURGERY     PORTACATH PLACEMENT N/A 08/07/2019   Procedure: INSERTION PORT-A-CATH;  Surgeon: Herbert Pun, MD;  Location: [facility];   Service: General;  Laterality: N/A;   TONSILLECTOMY     TOTAL MASTECTOMY Right 03/21/2020   Procedure: TOTAL MASTECTOMY;  Surgeon: Herbert Pun, MD;  Location: [facility];  Service: General;  Laterality: Right;    There were no vitals filed for this visit.   Subjective Assessment - 08/02/20 0950    Subjective Patient reports same pain and stiffness over the weekend. Reports pain this am pain is 2/10. Has been somewhat compliant with HEP.    Pertinent History Patient is a 44 year old female with L shoulder pain that began after a L port-a-cath was inserted last December for chemotherapy treatments for R breast cancer. Pt has concluded chemo treatments, but per onocologist is to keep port for a "little while longer". Has had R masectomy and R expander placement and removal (no current plans for reconstruction). Pt reports a steady increase in pain and loss of motion over time since the port was inserted. Pt has received cortisone shots in L shoulder for bursitis last July and stated it helps decrease pain for some time but did not last very [place]. Pt had an expander put in on R side post masectomy last year but the tissue died over it and is thinking about getting another one. Pt has difficulty and pain with all movements and states only rest and no movement makes her L shoulder feel better.  Pt scales the pain a 10/10 at worst and 1/10 at best when not moving it. Pt is sleeping on her back but depending on if she rolls over, her L shoulder can be painful in the AM. Patient denies any N/V, B&B, unexplained weight loss, night pain, fever, or night sweats.    Limitations Lifting;House hold activities    How [place] can you sit comfortably? unlimited    How [place] can you stand comfortably? unlimited    How [place] can you walk comfortably? unlimited    Patient Stated Goals decrease pain, be able to move LUE to PLOF    Pain Onset More than a month ago             Ther-Ex Pulleys flex x15 abd x15 with  cuing for scapulohumeral rhythm and cues to keep cervical in neutral   Dowel AAROM IR x15 3sec hold  AAROM ER by side with door x15 3sec   Supine AAROM flex x15 2-3sec hold with dowel   Low pec stretch standing in doorway 2x30sec   Manual PROM all directions G1-2 post CHJ mob in 60d abd G3 post GHJ mobs with movement into flex/abd  STM to bicep (severe pain to minimal palpation), UT, and pec minor. During education on desensitization with good understanding.  Pt guarded throughout all PROM especially with ER and abd                         PT Education - 08/02/20 0951    Education Details therex form/technique    Person(s) Educated Patient    Methods Explanation;Demonstration;Verbal cues    Comprehension Verbalized understanding;Returned demonstration;Verbal cues required            PT Short Term Goals - 07/25/20 1209      PT SHORT TERM GOAL #1   Title Patient will demonstrate independence with HEP to be able to decrease pain and improve L shoulder function    Baseline 07/25/20 HEP given    Time 4    Period Weeks    Status New    Target Date 08/22/20             PT [place] Term Goals - 07/25/20 1212      PT [place] TERM GOAL #1   Title Patient will decrease worst pain as reported on NPRS scale by 3 points to demonstrate a clinically significant reduction in pain.    Baseline 07/25/20 10/10 at worst    Time 8    Period Weeks    Status New    Target Date 09/19/20      PT [place] TERM GOAL #2   Title Patient will increase L shoulder AROM to be [facility] in order to complete ADLs and household activities without pain or difficulty    Baseline 07/25/20 flexion 109, ER base of skull, IR S1, abd 45    Time 8    Period Weeks    Status New    Target Date 09/19/20      PT [place] TERM GOAL #3   Title Patient will increase L shoulder MMT strength to 4/5 to be able to raise arm overhead to complete household chores and return to PLOF    Baseline 07/25/20 flexion,  abd, ER, ext 2+ IR 3    Time 8    Period Weeks    Status New    Target Date 09/19/20  Plan - 08/02/20 0953    Clinical Impression Statement PT continued progression for increased shoulder mobility with manual and therex techniques with success. Patient with heightened pain response/sensitization. PT educated patient pain science, pain as a multimodal experience taking into account past experience, sleep hygiene, pain anticipation etc. and updated HEP to reflect desensitization efforts with verbalized understanding from patient. Pt very pain focused throughout session but is accepting to pain science education. Patient is motivated throughout session with steady increase in mobility. PT will ontinue progression as able.    Personal Factors and Comorbidities Age;Comorbidity 1;Past/Current Experience;Time since onset of injury/illness/exacerbation;Sex    Comorbidities R breast cancer    Examination-Activity Limitations Bathing;Sleep;Lift;Dressing;Reach Overhead;Hygiene/Grooming    Examination-Participation Restrictions Cleaning;Laundry;Yard Work;Community Activity    Stability/Clinical Decision Making Evolving/Moderate complexity    Clinical Decision Making Moderate    Rehab Potential Good    PT Frequency 2x / week    PT Duration 8 weeks    PT Treatment/Interventions ADLs/Self Care Home Management;Electrical Stimulation;Moist Heat;Iontophoresis 4mg /ml Dexamethasone;Traction;Ultrasound;Functional mobility training;Therapeutic activities;Therapeutic exercise;Neuromuscular re-education;Patient/family education;Manual techniques;Passive range of motion;Dry needling;Scar mobilization;Joint Manipulations    PT Next Visit Plan HEP review    PT Home Exercise Plan AAROM pulleys, IR towel stretch, pec stretch    Consulted and Agree with Plan of Care Patient           Patient will benefit from skilled therapeutic intervention in order to improve the following deficits and  impairments:  Decreased mobility,Hypomobility,Improper body mechanics,Impaired tone,Decreased range of motion,Decreased strength,Increased fascial restricitons,Impaired flexibility,Impaired UE functional use,Postural dysfunction,Pain  Visit Diagnosis: Chronic left shoulder pain  Stiffness of left shoulder, not elsewhere classified     Problem List Patient Active Problem List   Diagnosis Date Noted   Breast cancer ([place]) 03/21/2020   Anemia due to antineoplastic chemotherapy 02/15/2020   Cardiomyopathy ([place]) 12/12/2019   Dehydration 11/24/2019   Acute pain of left shoulder 11/24/2019   Orthostatic hypotension 10/12/2019   Low serum cortisol level ([place]) 09/29/2019   Malignant neoplasm of breast in female, estrogen receptor negative ([place]) 09/28/2019   Hypokalemia 09/15/2019   Hypocalcemia 09/15/2019   Normocytic anemia 09/15/2019   B12 deficiency 09/15/2019   Iron deficiency anemia 09/15/2019   Encounter for antineoplastic chemotherapy 08/25/2019   Goals of care, counseling/discussion 08/17/2019   Weight loss 07/31/2019   Invasive carcinoma of breast ([place]) 07/30/2019   H/O diarrhea 01/01/2017   Durwin Reges DPT Durwin Reges 08/02/2020, 11:14 AM  [facility] [address] [place], [address] Phone: [phone]   Fax:  [phone]  Name: Heather Bell MRN: [id number] Date of Birth: 1976/07/19

## 2020-08-03 ENCOUNTER — Encounter: Payer: Self-pay | Admitting: Physical Therapy

## 2020-08-03 ENCOUNTER — Ambulatory Visit: Payer: BC Managed Care – PPO | Admitting: Physical Therapy

## 2020-08-03 DIAGNOSIS — M25612 Stiffness of left shoulder, not elsewhere classified: Secondary | ICD-10-CM

## 2020-08-03 DIAGNOSIS — M25512 Pain in left shoulder: Secondary | ICD-10-CM

## 2020-08-03 NOTE — Therapy (Signed)
[facility] [address], [place], [address] Phone: [phone]   Fax:  [phone]  Physical Therapy Treatment  Patient Details  Name: Heather Bell MRN: [id number] Date of Birth: 05/03/1976 No data recorded  Encounter Date: 08/03/2020   PT End of Session - 08/03/20 0954    Visit Number 4    Number of Visits 17    Date for PT Re-Evaluation 08/22/20    PT Start Time 0949    PT Stop Time 1027    PT Time Calculation (min) 38 min    Activity Tolerance Patient tolerated treatment well;Patient limited by pain    Behavior During Therapy [facility] for tasks assessed/performed           Past Medical History:  Diagnosis Date  . Allergic rhinitis   . Anxiety   . Bipolar disorder ([place])    depression  . Cancer [facility])    breast right  . Depression   . Diabetes ([place])   . GERD (gastroesophageal reflux disease)   . Hyperlipidemia     Past Surgical History:  Procedure Laterality Date  . BREAST BIOPSY Right 07/22/2019   mass bx, path pending, heart marker  . BREAST BIOPSY Right 07/22/2019   LN bx, path pending,  butterfly hydromarker  . BREAST RECONSTRUCTION WITH PLACEMENT OF TISSUE EXPANDER AND FLEX HD (ACELLULAR HYDRATED DERMIS) Right 03/21/2020   Procedure: RIGHT BREAST RECONSTRUCTION WITH PLACEMENT OF TISSUE EXPANDER AND FLEX HD (ACELLULAR HYDRATED DERMIS);  Surgeon: [name], MD;  Location: [facility];  Service: Plastics;  Laterality: Right;  . IMAGE GUIDED SINUS SURGERY    . INCISION AND DRAINAGE OF WOUND Right 05/02/2020   Procedure: IRRIGATION AND DEBRIDEMENT WOUND WITH REMOVAL OF RIGHT TISSUE EXPANDER;  Surgeon: [name], MD;  Location: [facility];  Service: Plastics;  Laterality: Right;  1 hour please  . KNEE SURGERY    . NASAL SEPTUM SURGERY    . PORTACATH PLACEMENT N/A 08/07/2019   Procedure: INSERTION PORT-A-CATH;  Surgeon: Herbert Pun, MD;  Location: [facility];   Service: General;  Laterality: N/A;  . TONSILLECTOMY    . TOTAL MASTECTOMY Right 03/21/2020   Procedure: TOTAL MASTECTOMY;  Surgeon: Herbert Pun, MD;  Location: [facility];  Service: General;  Laterality: Right;    There were no vitals filed for this visit.   Subjective Assessment - 08/03/20 0951    Subjective Patient reports continued pain and stiffness today. Reports she was a little sore following last session. 4/10 pain this am    Pertinent History Patient is a 44 year old female with L shoulder pain that began after a L port-a-cath was inserted last December for chemotherapy treatments for R breast cancer. Pt has concluded chemo treatments, but per onocologist is to keep port for a "little while longer". Has had R masectomy and R expander placement and removal (no current plans for reconstruction). Pt reports a steady increase in pain and loss of motion over time since the port was inserted. Pt has received cortisone shots in L shoulder for bursitis last July and stated it helps decrease pain for some time but did not last very [place]. Pt had an expander put in on R side post masectomy last year but the tissue died over it and is thinking about getting another one. Pt has difficulty and pain with all movements and states only rest and no movement makes her L shoulder feel better. Pt scales  the pain a 10/10 at worst and 1/10 at best when not moving it. Pt is sleeping on her back but depending on if she rolls over, her L shoulder can be painful in the AM. Patient denies any N/V, B&B, unexplained weight loss, night pain, fever, or night sweats.    Limitations Lifting;House hold activities    How [place] can you sit comfortably? unlimited    How [place] can you stand comfortably? unlimited    How [place] can you walk comfortably? unlimited    Patient Stated Goals decrease pain, be able to move LUE to PLOF    Pain Onset More than a month ago             Ther-Ex Pulleys flex x15 abd x15 with cuing  for scapulohumeral rhythm andcues to keep cervical in neutral  Supine AAROM flex/abd/ER x12 2-3sec holdwith dowel  Sidelying sleeper stretch position AAROM IR x12 2-3sec hold  Low pecstretch standing in doorway2x30sec  AROM Flex 125 abd 85 ER C3 IR L5 midline  Manual PROM all directions G1-2 post CHJ mob in 60d abd G3post GHJ mobswith movement into flex/abd STM to bicep (severe pain to minimal palpation), UT, and pec minor. During education on desensitization with good understanding.  Ptguarded throughoutall PROM especially with ER and abd         PT Education - 08/03/20 0953    Education Details therex form/technique    Person(s) Educated Patient    Methods Explanation;Demonstration;Tactile cues;Verbal cues    Comprehension Verbalized understanding;Returned demonstration;Verbal cues required            PT Short Term Goals - 07/25/20 1209      PT SHORT TERM GOAL #1   Title Patient will demonstrate independence with HEP to be able to decrease pain and improve L shoulder function    Baseline 07/25/20 HEP given    Time 4    Period Weeks    Status New    Target Date 08/22/20             PT [place] Term Goals - 07/25/20 1212      PT [place] TERM GOAL #1   Title Patient will decrease worst pain as reported on NPRS scale by 3 points to demonstrate a clinically significant reduction in pain.    Baseline 07/25/20 10/10 at worst    Time 8    Period Weeks    Status New    Target Date 09/19/20      PT [place] TERM GOAL #2   Title Patient will increase L shoulder AROM to be [facility] in order to complete ADLs and household activities without pain or difficulty    Baseline 07/25/20 flexion 109, ER base of skull, IR S1, abd 45    Time 8    Period Weeks    Status New    Target Date 09/19/20      PT [place] TERM GOAL #3   Title Patient will increase L shoulder MMT strength to 4/5 to be able to raise arm overhead to complete household chores and return to PLOF     Baseline 07/25/20 flexion, abd, ER, ext 2+ IR 3    Time 8    Period Weeks    Status New    Target Date 09/19/20                 Plan - 08/03/20 1035    Clinical Impression Statement PT continued progression for increased shoulder mobility and desensitization with  success. Patient with decreased sensitization to bicep and deltoid group, able to tolerate deeper palpation than yesterday but still sensitive with increased pain response. Patient is motivated throughout session and able to complete all therex with proper technique following multimodal cuing. PT will continue progression as able.    Personal Factors and Comorbidities Age;Comorbidity 1;Past/Current Experience;Time since onset of injury/illness/exacerbation;Sex    Comorbidities R breast cancer    Examination-Activity Limitations Bathing;Sleep;Lift;Dressing;Reach Overhead;Hygiene/Grooming    Examination-Participation Restrictions Cleaning;Laundry;Yard Work;Community Activity    Stability/Clinical Decision Making Evolving/Moderate complexity    Clinical Decision Making Moderate    Rehab Potential Good    PT Frequency 2x / week    PT Duration 8 weeks    PT Treatment/Interventions ADLs/Self Care Home Management;Electrical Stimulation;Moist Heat;Iontophoresis 4mg /ml Dexamethasone;Traction;Ultrasound;Functional mobility training;Therapeutic activities;Therapeutic exercise;Neuromuscular re-education;Patient/family education;Manual techniques;Passive range of motion;Dry needling;Scar mobilization;Joint Manipulations    PT Next Visit Plan HEP review    PT Home Exercise Plan AAROM pulleys, IR towel stretch, pec stretch    Consulted and Agree with Plan of Care Patient           Patient will benefit from skilled therapeutic intervention in order to improve the following deficits and impairments:  Decreased mobility,Hypomobility,Improper body mechanics,Impaired tone,Decreased range of motion,Decreased strength,Increased fascial  restricitons,Impaired flexibility,Impaired UE functional use,Postural dysfunction,Pain  Visit Diagnosis: Chronic left shoulder pain  Stiffness of left shoulder, not elsewhere classified     Problem List Patient Active Problem List   Diagnosis Date Noted  . Breast cancer ([place]) 03/21/2020  . Anemia due to antineoplastic chemotherapy 02/15/2020  . Cardiomyopathy ([place]) 12/12/2019  . Dehydration 11/24/2019  . Acute pain of left shoulder 11/24/2019  . Orthostatic hypotension 10/12/2019  . Low serum cortisol level ([place]) 09/29/2019  . Malignant neoplasm of breast in female, estrogen receptor negative ([place]) 09/28/2019  . Hypokalemia 09/15/2019  . Hypocalcemia 09/15/2019  . Normocytic anemia 09/15/2019  . B12 deficiency 09/15/2019  . Iron deficiency anemia 09/15/2019  . Encounter for antineoplastic chemotherapy 08/25/2019  . Goals of care, counseling/discussion 08/17/2019  . Weight loss 07/31/2019  . Invasive carcinoma of breast ([place]) 07/30/2019  . H/O diarrhea 01/01/2017   Durwin Reges DPT Durwin Reges 08/03/2020, 11:21 AM  [facility] [address], [place], [address] Phone: [phone]   Fax:  [phone]  Name: Heather Bell MRN: [id number] Date of Birth: 31-May-1976

## 2020-08-04 ENCOUNTER — Other Ambulatory Visit: Payer: Self-pay

## 2020-08-04 ENCOUNTER — Inpatient Hospital Stay: Payer: BC Managed Care – PPO | Attending: Hematology and Oncology

## 2020-08-04 DIAGNOSIS — Z9011 Acquired absence of right breast and nipple: Secondary | ICD-10-CM | POA: Insufficient documentation

## 2020-08-04 DIAGNOSIS — Z95828 Presence of other vascular implants and grafts: Secondary | ICD-10-CM

## 2020-08-04 DIAGNOSIS — Z171 Estrogen receptor negative status [ER-]: Secondary | ICD-10-CM | POA: Insufficient documentation

## 2020-08-04 DIAGNOSIS — C773 Secondary and unspecified malignant neoplasm of axilla and upper limb lymph nodes: Secondary | ICD-10-CM | POA: Insufficient documentation

## 2020-08-04 DIAGNOSIS — C50512 Malignant neoplasm of lower-outer quadrant of left female breast: Secondary | ICD-10-CM | POA: Diagnosis not present

## 2020-08-04 MED ORDER — SODIUM CHLORIDE 0.9% FLUSH
10.0000 mL | INTRAVENOUS | Status: DC | PRN
  Administered 2020-08-04: 10 mL via INTRAVENOUS
  Filled 2020-08-04: qty 10

## 2020-08-04 MED ORDER — HEPARIN SOD (PORK) LOCK FLUSH 100 UNIT/ML IV SOLN
500.0000 [IU] | Freq: Once | INTRAVENOUS | Status: AC
  Administered 2020-08-04: 500 [IU] via INTRAVENOUS
  Filled 2020-08-04: qty 5

## 2020-08-08 ENCOUNTER — Ambulatory Visit: Payer: BC Managed Care – PPO | Admitting: Physical Therapy

## 2020-08-08 ENCOUNTER — Encounter: Payer: Self-pay | Admitting: Physical Therapy

## 2020-08-08 ENCOUNTER — Other Ambulatory Visit: Payer: Self-pay

## 2020-08-08 DIAGNOSIS — M25612 Stiffness of left shoulder, not elsewhere classified: Secondary | ICD-10-CM | POA: Diagnosis not present

## 2020-08-08 DIAGNOSIS — M25512 Pain in left shoulder: Secondary | ICD-10-CM

## 2020-08-08 NOTE — Therapy (Signed)
[facility] [address], [place], [address] Phone: [phone]   Fax:  [phone]  Physical Therapy Treatment  Patient Details  Name: Heather Bell MRN: [id number] Date of Birth: 05-13-1976 No data recorded  Encounter Date: 08/08/2020   PT End of Session - 08/08/20 0916    Visit Number 5    Number of Visits 17    Date for PT Re-Evaluation 08/22/20    PT Start Time 0905    PT Stop Time 0945    PT Time Calculation (min) 40 min    Activity Tolerance Patient tolerated treatment well;Patient limited by pain    Behavior During Therapy [facility] for tasks assessed/performed           Past Medical History:  Diagnosis Date  . Allergic rhinitis   . Anxiety   . Bipolar disorder ([place])    depression  . Cancer [facility])    breast right  . Depression   . Diabetes ([place])   . GERD (gastroesophageal reflux disease)   . Hyperlipidemia     Past Surgical History:  Procedure Laterality Date  . BREAST BIOPSY Right 07/22/2019   mass bx, path pending, heart marker  . BREAST BIOPSY Right 07/22/2019   LN bx, path pending,  butterfly hydromarker  . BREAST RECONSTRUCTION WITH PLACEMENT OF TISSUE EXPANDER AND FLEX HD (ACELLULAR HYDRATED DERMIS) Right 03/21/2020   Procedure: RIGHT BREAST RECONSTRUCTION WITH PLACEMENT OF TISSUE EXPANDER AND FLEX HD (ACELLULAR HYDRATED DERMIS);  Surgeon: [name], MD;  Location: [facility];  Service: Plastics;  Laterality: Right;  . IMAGE GUIDED SINUS SURGERY    . INCISION AND DRAINAGE OF WOUND Right 05/02/2020   Procedure: IRRIGATION AND DEBRIDEMENT WOUND WITH REMOVAL OF RIGHT TISSUE EXPANDER;  Surgeon: [name], MD;  Location: [facility];  Service: Plastics;  Laterality: Right;  1 hour please  . KNEE SURGERY    . NASAL SEPTUM SURGERY    . PORTACATH PLACEMENT N/A 08/07/2019   Procedure: INSERTION PORT-A-CATH;  Surgeon: Herbert Pun, MD;  Location: [facility];   Service: General;  Laterality: N/A;  . TONSILLECTOMY    . TOTAL MASTECTOMY Right 03/21/2020   Procedure: TOTAL MASTECTOMY;  Surgeon: Herbert Pun, MD;  Location: [facility];  Service: General;  Laterality: Right;    There were no vitals filed for this visit.   Subjective Assessment - 08/08/20 0914    Subjective Patient reports continued pain and stiffness, is unsure if motion is getting any better. 3/10 pain this morning.    Pertinent History Patient is a 44 year old female with L shoulder pain that began after a L port-a-cath was inserted last December for chemotherapy treatments for R breast cancer. Pt has concluded chemo treatments, but per onocologist is to keep port for a "little while longer". Has had R masectomy and R expander placement and removal (no current plans for reconstruction). Pt reports a steady increase in pain and loss of motion over time since the port was inserted. Pt has received cortisone shots in L shoulder for bursitis last July and stated it helps decrease pain for some time but did not last very [place]. Pt had an expander put in on R side post masectomy last year but the tissue died over it and is thinking about getting another one. Pt has difficulty and pain with all movements and states only rest and no movement makes her L shoulder feel better. Pt scales the pain  a 10/10 at worst and 1/10 at best when not moving it. Pt is sleeping on her back but depending on if she rolls over, her L shoulder can be painful in the AM. Patient denies any N/V, B&B, unexplained weight loss, night pain, fever, or night sweats.    Limitations Lifting;House hold activities    How [place] can you sit comfortably? unlimited    How [place] can you stand comfortably? unlimited    How [place] can you walk comfortably? unlimited    Patient Stated Goals decrease pain, be able to move LUE to PLOF    Pain Onset More than a month ago           Ther-Ex Pulleys flex x15abd x15with cuing for  scapulohumeral rhythm andcues to keep cervical in neutral  Supine AAROM flex/abd/ER x12 2-3sec holdwith dowel  Sidelying sleeper stretch position AAROM IR x12 2-3sec hold  Seated tricep/lat stretch stretch 2x 30sec  Low pecstretch standing in doorway2x30sec  AROM Flex 121 abd 91 ER C6 IR L3 midline  Manual PROM all directions G1-2 post CHJ mob in 60d abd G3post GHJmobswithmovement intoflex/abd STM to bicep (severe pain to minimal palpation), UT, and pec minor. During education on desensitization with good understanding. Ptguarded throughoutall PROM especially with ER and abd               PT Education - 08/08/20 0915    Education Details therex form/technique    Person(s) Educated Patient    Methods Explanation;Demonstration;Verbal cues    Comprehension Verbalized understanding;Returned demonstration;Verbal cues required            PT Short Term Goals - 07/25/20 1209      PT SHORT TERM GOAL #1   Title Patient will demonstrate independence with HEP to be able to decrease pain and improve L shoulder function    Baseline 07/25/20 HEP given    Time 4    Period Weeks    Status New    Target Date 08/22/20             PT Widmayer Term Goals - 07/25/20 1212      PT Papa TERM GOAL #1   Title Patient will decrease worst pain as reported on NPRS scale by 3 points to demonstrate a clinically significant reduction in pain.    Baseline 07/25/20 10/10 at worst    Time 8    Period Weeks    Status New    Target Date 09/19/20      PT Lemberger TERM GOAL #2   Title Patient will increase L shoulder AROM to be [facility] in order to complete ADLs and household activities without pain or difficulty    Baseline 07/25/20 flexion 109, ER base of skull, IR S1, abd 45    Time 8    Period Weeks    Status New    Target Date 09/19/20      PT Carothers TERM GOAL #3   Title Patient will increase L shoulder MMT strength to 4/5 to be able to raise arm overhead to complete  household chores and return to PLOF    Baseline 07/25/20 flexion, abd, ER, ext 2+ IR 3    Time 8    Period Weeks    Status New    Target Date 09/19/20                 Plan - 08/08/20 5397    Clinical Impression Statement PT continued therex progression for increased shoulder mobility  with success. Patient with increased pain to palpation and manual techniques, that subsides following. Pt is able to comply with all cuing for proper techniques following cuing. Patient is demonstrating slight progress with AROM between sessions, and maintaining motion. PT will continue progression as able.    Personal Factors and Comorbidities Age;Comorbidity 1;Past/Current Experience;Time since onset of injury/illness/exacerbation;Sex    Comorbidities R breast cancer    Examination-Activity Limitations Bathing;Sleep;Lift;Dressing;Reach Overhead;Hygiene/Grooming    Examination-Participation Restrictions Cleaning;Laundry;Yard Work;Community Activity    Stability/Clinical Decision Making Evolving/Moderate complexity    Clinical Decision Making Moderate    Rehab Potential Good    PT Frequency 2x / week    PT Duration 8 weeks    PT Treatment/Interventions ADLs/Self Care Home Management;Electrical Stimulation;Moist Heat;Iontophoresis 4mg /ml Dexamethasone;Traction;Ultrasound;Functional mobility training;Therapeutic activities;Therapeutic exercise;Neuromuscular re-education;Patient/family education;Manual techniques;Passive range of motion;Dry needling;Scar mobilization;Joint Manipulations    PT Next Visit Plan HEP review    PT Home Exercise Plan AAROM pulleys, IR towel stretch, pec stretch    Consulted and Agree with Plan of Care Patient           Patient will benefit from skilled therapeutic intervention in order to improve the following deficits and impairments:  Decreased mobility,Hypomobility,Improper body mechanics,Impaired tone,Decreased range of motion,Decreased strength,Increased fascial  restricitons,Impaired flexibility,Impaired UE functional use,Postural dysfunction,Pain  Visit Diagnosis: Chronic left shoulder pain  Stiffness of left shoulder, not elsewhere classified     Problem List Patient Active Problem List   Diagnosis Date Noted  . Breast cancer ([place]) 03/21/2020  . Anemia due to antineoplastic chemotherapy 02/15/2020  . Cardiomyopathy ([place]) 12/12/2019  . Dehydration 11/24/2019  . Acute pain of left shoulder 11/24/2019  . Orthostatic hypotension 10/12/2019  . Low serum cortisol level ([place]) 09/29/2019  . Malignant neoplasm of breast in female, estrogen receptor negative ([place]) 09/28/2019  . Hypokalemia 09/15/2019  . Hypocalcemia 09/15/2019  . Normocytic anemia 09/15/2019  . B12 deficiency 09/15/2019  . Iron deficiency anemia 09/15/2019  . Encounter for antineoplastic chemotherapy 08/25/2019  . Goals of care, counseling/discussion 08/17/2019  . Weight loss 07/31/2019  . Invasive carcinoma of breast ([place]) 07/30/2019  . H/O diarrhea 01/01/2017   Durwin Reges DPT Durwin Reges 08/08/2020, 9:55 AM  [facility] [address], [place], [address] Phone: [phone]   Fax:  [phone]  Name: Heather Bell MRN: [id number] Date of Birth: 1976/07/29

## 2020-08-09 ENCOUNTER — Encounter: Payer: Self-pay | Admitting: Radiology

## 2020-08-09 ENCOUNTER — Ambulatory Visit
Admission: RE | Admit: 2020-08-09 | Discharge: 2020-08-09 | Disposition: A | Discharge status: Home / Self Care | Payer: BC Managed Care – PPO | Source: Ambulatory Visit | Attending: General Surgery | Admitting: General Surgery

## 2020-08-09 DIAGNOSIS — Z1231 Encounter for screening mammogram for malignant neoplasm of breast: Secondary | ICD-10-CM

## 2020-08-09 HISTORY — DX: Personal history of antineoplastic chemotherapy: Z92.21

## 2020-08-10 ENCOUNTER — Encounter: Payer: Self-pay | Admitting: Physical Therapy

## 2020-08-10 ENCOUNTER — Other Ambulatory Visit: Payer: Self-pay

## 2020-08-10 ENCOUNTER — Ambulatory Visit: Payer: BC Managed Care – PPO | Admitting: Physical Therapy

## 2020-08-10 DIAGNOSIS — M25612 Stiffness of left shoulder, not elsewhere classified: Secondary | ICD-10-CM

## 2020-08-10 DIAGNOSIS — G8929 Other chronic pain: Secondary | ICD-10-CM

## 2020-08-10 NOTE — Therapy (Signed)
[facility] [address] [place], [address] Phone: [phone]   Fax:  [phone]  Physical Therapy Treatment  Patient Details  Name: Heather Bell MRN: [id number] Date of Birth: Nov 14, 1975 No data recorded  Encounter Date: 08/10/2020   PT End of Session - 08/10/20 0902    Visit Number 6    Number of Visits 17    Date for PT Re-Evaluation 08/22/20    PT Start Time 0859    PT Stop Time 0937    PT Time Calculation (min) 38 min    Activity Tolerance Patient tolerated treatment well;Patient limited by pain    Behavior During Therapy [facility] for tasks assessed/performed           Past Medical History:  Diagnosis Date   Allergic rhinitis    Anxiety    Bipolar disorder ([place])    depression   Breast cancer ([place]) 03/21/2020   Right mastectomy   Cancer ([place])    breast right   Depression    Diabetes ([place])    GERD (gastroesophageal reflux disease)    Hyperlipidemia    Personal history of chemotherapy    Jan 2021 through July 2021    Past Surgical History:  Procedure Laterality Date   BREAST BIOPSY Right 07/22/2019   mass bx, path pending, heart marker   BREAST BIOPSY Right 07/22/2019   LN bx, path pending,  butterfly hydromarker   BREAST RECONSTRUCTION WITH PLACEMENT OF TISSUE EXPANDER AND FLEX HD (ACELLULAR HYDRATED DERMIS) Right 03/21/2020   Procedure: RIGHT BREAST RECONSTRUCTION WITH PLACEMENT OF TISSUE EXPANDER AND FLEX HD (ACELLULAR HYDRATED DERMIS);  Surgeon: [name], MD;  Location: [facility];  Service: Plastics;  Laterality: Right;   IMAGE GUIDED SINUS SURGERY     INCISION AND DRAINAGE OF WOUND Right 05/02/2020   Procedure: IRRIGATION AND DEBRIDEMENT WOUND WITH REMOVAL OF RIGHT TISSUE EXPANDER;  Surgeon: [name], MD;  Location: [facility];  Service: Plastics;  Laterality: Right;  1 hour please   KNEE SURGERY     MASTECTOMY Right 03/21/2020   NASAL  SEPTUM SURGERY     PORTACATH PLACEMENT N/A 08/07/2019   Procedure: INSERTION PORT-A-CATH;  Surgeon: Herbert Pun, MD;  Location: [facility];  Service: General;  Laterality: N/A;   TONSILLECTOMY     TOTAL MASTECTOMY Right 03/21/2020   Procedure: TOTAL MASTECTOMY;  Surgeon: Herbert Pun, MD;  Location: [facility];  Service: General;  Laterality: Right;    There were no vitals filed for this visit.   Subjective Assessment - 08/10/20 0900    Subjective Pt reports continued pain and stiffness, reporting pain is 1/10 today.    Pertinent History Patient is a 44 year old female with L shoulder pain that began after a L port-a-cath was inserted last December for chemotherapy treatments for R breast cancer. Pt has concluded chemo treatments, but per onocologist is to keep port for a "little while longer". Has had R masectomy and R expander placement and removal (no current plans for reconstruction). Pt reports a steady increase in pain and loss of motion over time since the port was inserted. Pt has received cortisone shots in L shoulder for bursitis last July and stated it helps decrease pain for some time but did not last very [place]. Pt had an expander put in on R side post masectomy last year but the tissue died over it and is thinking about getting another one. Pt has difficulty  and pain with all movements and states only rest and no movement makes her L shoulder feel better. Pt scales the pain a 10/10 at worst and 1/10 at best when not moving it. Pt is sleeping on her back but depending on if she rolls over, her L shoulder can be painful in the AM. Patient denies any N/V, B&B, unexplained weight loss, night pain, fever, or night sweats.    Limitations Lifting;House hold activities    How [place] can you sit comfortably? unlimited    How [place] can you stand comfortably? unlimited    How [place] can you walk comfortably? unlimited    Patient Stated Goals decrease pain, be able to move LUE to PLOF     Pain Onset More than a month ago              Ther-Ex Pulleys flex x15abd x15with cuing for scapulohumeral rhythm andcues to keep cervical in neutral  Supine AAROM IR <> ERx122-3sec holdwith dowel  Supine AAROM abd x15 2-3sec   Sidelying sleeper stretch position AAROM IR x12 2-3sec hold  Seated thoracic ext with AAROM flex with dowel x15 2-3sec hold   UE ranger clockwise and counterclockwise circles x15 each direction  Seated tricep/lat stretch stretch x60sec  Low pecstretch standing in doorway 60sec   AROM Flex 129 abd 96 ER C6 IR L2 midline  Manual PROM all directions G1-2 post CHJ mob in 60d abd G3post GHJmobswithmovement intoflex/abd STM to bicep (severe pain to minimal palpation), UT, and pec minor. During education on desensitization with good understanding. Ptguarded throughoutall PROM especially with ER and abd         PT Education - 08/10/20 0902    Education Details therex form/technique    Person(s) Educated Patient    Methods Explanation;Demonstration;Verbal cues    Comprehension Verbalized understanding;Returned demonstration;Verbal cues required            PT Short Term Goals - 07/25/20 1209      PT SHORT TERM GOAL #1   Title Patient will demonstrate independence with HEP to be able to decrease pain and improve L shoulder function    Baseline 07/25/20 HEP given    Time 4    Period Weeks    Status New    Target Date 08/22/20             PT Azizi Term Goals - 07/25/20 1212      PT [place]ste[place] TERM GOAL #1   Title Patient will decrease worst pain as reported on NPRS scale by 3 points to demonstrate a clinically significant reduction in pain.    Baseline 07/25/20 10/10 at worst    Time 8    Period Weeks    Status New    Target Date 09/19/20      PT [place] TERM GOAL #2   Title Patient will increase L shoulder AROM to be [facility] in order to complete ADLs and household activities without pain or difficulty     Baseline 07/25/20 flexion 109, ER base of skull, IR S1, abd 45    Time 8    Period Weeks    Status New    Target Date 09/19/20      PT [place]mon TERM GOAL #3   Title Patient will increase L shoulder MMT strength to 4/5 to be able to raise arm overhead to complete household chores and return to PLOF    Baseline 07/25/20 flexion, abd, ER, ext 2+ IR 3    Time 8  Period Weeks    Status New    Target Date 09/19/20                 Plan - 08/10/20 0905    Clinical Impression Statement PT continued therex progression for increased shoulder mobility with success. Patient with continued pain through PROM and AROM. Pt is able to complete therex with proper techniques following cuing with progression in anti gravity positions. PT will continue progression as able.    Personal Factors and Comorbidities Age;Comorbidity 1;Past/Current Experience;Time since onset of injury/illness/exacerbation;Sex    Comorbidities R breast cancer    Examination-Activity Limitations Bathing;Sleep;Lift;Dressing;Reach Overhead;Hygiene/Grooming    Examination-Participation Restrictions Cleaning;Laundry;Yard Work;Community Activity    Stability/Clinical Decision Making Evolving/Moderate complexity    Clinical Decision Making Moderate    Rehab Potential Good    PT Frequency 2x / week    PT Duration 8 weeks    PT Treatment/Interventions ADLs/Self Care Home Management;Electrical Stimulation;Moist Heat;Iontophoresis 4mg /ml Dexamethasone;Traction;Ultrasound;Functional mobility training;Therapeutic activities;Therapeutic exercise;Neuromuscular re-education;Patient/family education;Manual techniques;Passive range of motion;Dry needling;Scar mobilization;Joint Manipulations    PT Next Visit Plan HEP review    PT Home Exercise Plan AAROM pulleys, IR towel stretch, pec stretch    Consulted and Agree with Plan of Care Patient           Patient will benefit from skilled therapeutic intervention in order to improve the following  deficits and impairments:  Decreased mobility,Hypomobility,Improper body mechanics,Impaired tone,Decreased range of motion,Decreased strength,Increased fascial restricitons,Impaired flexibility,Impaired UE functional use,Postural dysfunction,Pain  Visit Diagnosis: Chronic left shoulder pain  Stiffness of left shoulder, not elsewhere classified     Problem List Patient Active Problem List   Diagnosis Date Noted   Breast cancer ([place]) 03/21/2020   Anemia due to antineoplastic chemotherapy 02/15/2020   Cardiomyopathy ([place]) 12/12/2019   Dehydration 11/24/2019   Acute pain of left shoulder 11/24/2019   Orthostatic hypotension 10/12/2019   Low serum cortisol level ([place]) 09/29/2019   Malignant neoplasm of breast in female, estrogen receptor negative ([place]) 09/28/2019   Hypokalemia 09/15/2019   Hypocalcemia 09/15/2019   Normocytic anemia 09/15/2019   B12 deficiency 09/15/2019   Iron deficiency anemia 09/15/2019   Encounter for antineoplastic chemotherapy 08/25/2019   Goals of care, counseling/discussion 08/17/2019   Weight loss 07/31/2019   Invasive carcinoma of breast ([place]) 07/30/2019   H/O diarrhea 01/01/2017   Durwin Reges DPT Durwin Reges 08/10/2020, 10:34 AM  [place] [facility] [address] [place], [address] Phone: [phone]   Fax:  [phone]  Name: Heather Bell MRN: [id number] Date of Birth: 08-25-1975

## 2020-08-16 ENCOUNTER — Ambulatory Visit: Payer: BC Managed Care – PPO | Admitting: Physical Therapy

## 2020-08-16 ENCOUNTER — Encounter: Payer: Self-pay | Admitting: Physical Therapy

## 2020-08-16 ENCOUNTER — Other Ambulatory Visit: Payer: Self-pay

## 2020-08-16 DIAGNOSIS — M25512 Pain in left shoulder: Secondary | ICD-10-CM

## 2020-08-16 DIAGNOSIS — M25612 Stiffness of left shoulder, not elsewhere classified: Secondary | ICD-10-CM | POA: Diagnosis not present

## 2020-08-16 NOTE — Therapy (Signed)
[facility] [address], [place], [address] Phone: [phone]   Fax:  [phone]  Physical Therapy Treatment  Patient Details  Name: Heather Bell MRN: [id number] Date of Birth: 1975-10-17 No data recorded  Encounter Date: 08/16/2020   PT End of Session - 08/16/20 0924    Visit Number 7    Number of Visits 17    Date for PT Re-Evaluation 08/22/20    PT Start Time 0905    PT Stop Time 0945    PT Time Calculation (min) 40 min    Activity Tolerance Patient tolerated treatment well;Patient limited by pain    Behavior During Therapy [facility] for tasks assessed/performed           Past Medical History:  Diagnosis Date  . Allergic rhinitis   . Anxiety   . Bipolar disorder ([place])    depression  . Breast cancer ([place]) 03/21/2020   Right mastectomy  . Cancer [facility])    breast right  . Depression   . Diabetes ([place])   . GERD (gastroesophageal reflux disease)   . Hyperlipidemia   . Personal history of chemotherapy    Jan 2021 through July 2021    Past Surgical History:  Procedure Laterality Date  . BREAST BIOPSY Right 07/22/2019   mass bx, path pending, heart marker  . BREAST BIOPSY Right 07/22/2019   LN bx, path pending,  butterfly hydromarker  . BREAST RECONSTRUCTION WITH PLACEMENT OF TISSUE EXPANDER AND FLEX HD (ACELLULAR HYDRATED DERMIS) Right 03/21/2020   Procedure: RIGHT BREAST RECONSTRUCTION WITH PLACEMENT OF TISSUE EXPANDER AND FLEX HD (ACELLULAR HYDRATED DERMIS);  Surgeon: [name], MD;  Location: [facility];  Service: Plastics;  Laterality: Right;  . IMAGE GUIDED SINUS SURGERY    . INCISION AND DRAINAGE OF WOUND Right 05/02/2020   Procedure: IRRIGATION AND DEBRIDEMENT WOUND WITH REMOVAL OF RIGHT TISSUE EXPANDER;  Surgeon: [name], MD;  Location: [place];  Service: Plastics;  Laterality: Right;  1 hour please  . KNEE SURGERY    . MASTECTOMY Right 03/21/2020  . NASAL  SEPTUM SURGERY    . PORTACATH PLACEMENT N/A 08/07/2019   Procedure: INSERTION PORT-A-CATH;  Surgeon: Herbert Pun, MD;  Location: [facility];  Service: General;  Laterality: N/A;  . TONSILLECTOMY    . TOTAL MASTECTOMY Right 03/21/2020   Procedure: TOTAL MASTECTOMY;  Surgeon: Herbert Pun, MD;  Location: [facility];  Service: General;  Laterality: Right;    There were no vitals filed for this visit.   Subjective Assessment - 08/16/20 0911    Subjective Pt reports continued pain and stiffness, reporting 3/10 pain this am. Had to admit her mother to the hospital over the weekend, so not able to complete her HEP.    Pertinent History Patient is a 44 year old female with L shoulder pain that began after a L port-a-cath was inserted last December for chemotherapy treatments for R breast cancer. Pt has concluded chemo treatments, but per onocologist is to keep port for a "little while longer". Has had R masectomy and R expander placement and removal (no current plans for reconstruction). Pt reports a steady increase in pain and loss of motion over time since the port was inserted. Pt has received cortisone shots in L shoulder for bursitis last July and stated it helps decrease pain for some time but did not last very [place]. Pt had an expander put in on R side post masectomy  last year but the tissue died over it and is thinking about getting another one. Pt has difficulty and pain with all movements and states only rest and no movement makes her L shoulder feel better. Pt scales the pain a 10/10 at worst and 1/10 at best when not moving it. Pt is sleeping on her back but depending on if she rolls over, her L shoulder can be painful in the AM. Patient denies any N/V, B&B, unexplained weight loss, night pain, fever, or night sweats.    Limitations Lifting;House hold activities    How [place] can you sit comfortably? unlimited    How [place] can you stand comfortably? unlimited    How [place] can you walk  comfortably? unlimited    Patient Stated Goals decrease pain, be able to move LUE to PLOF    Pain Onset More than a month ago           Ther-Ex Pulleys flex x15abd x15with cuing for scapulohumeral rhythm andcues to keep cervical in neutral  Supine AAROM IR <> ERx122-3sec holdwith dowel  Supine AAROM abd x15 2-3sec   Seated thoracic ext with AAROM flex with dowel x15 2-3sec hold   UE ranger clockwise and counterclockwise circles; and walking flexion x12 each direction  Seated tricep/lat stretch stretch x60sec  Low pecstretch standing in doorway 60sec   AROM Flex 143 abd100 ER C6 IR L63midline  Manual PROM all directions G1-2 post CHJ mob in 60d abd G3post GHJmobswithmovement intoflex/abd STM to bicep (severe pain to minimal palpation), UT, and pec minor. During education on desensitization with good understanding. Ptguarded throughoutall PROM especially with ER and abd         PT Education - 08/16/20 0911    Education Details therex form/technique    Person(s) Educated Patient    Methods Explanation;Demonstration;Verbal cues    Comprehension Verbalized understanding;Returned demonstration;Verbal cues required            PT Short Term Goals - 07/25/20 1209      PT SHORT TERM GOAL #1   Title Patient will demonstrate independence with HEP to be able to decrease pain and improve L shoulder function    Baseline 07/25/20 HEP given    Time 4    Period Weeks    Status New    Target Date 08/22/20             PT [place] Term Goals - 07/25/20 1212      PT [place] TERM GOAL #1   Title Patient will decrease worst pain as reported on NPRS scale by 3 points to demonstrate a clinically significant reduction in pain.    Baseline 07/25/20 10/10 at worst    Time 8    Period Weeks    Status New    Target Date 09/19/20      PT [place] TERM GOAL #2   Title Patient will increase L shoulder AROM to be [facility] in order to complete ADLs and household  activities without pain or difficulty    Baseline 07/25/20 flexion 109, ER base of skull, IR S1, abd 45    Time 8    Period Weeks    Status New    Target Date 09/19/20      PT [place] TERM GOAL #3   Title Patient will increase L shoulder MMT strength to 4/5 to be able to raise arm overhead to complete household chores and return to PLOF    Baseline 07/25/20 flexion, abd, ER, ext 2+ IR  3    Time 8    Period Weeks    Status New    Target Date 09/19/20                 Plan - 08/16/20 1041    Clinical Impression Statement PT continued therex progression for increased shoulder mobility, and pain reduction. Patient with minimal increased stiffness following decreased HEP compliance over the holidays. Patient is able to tolerate deep trigger pointing to bicep group and pec major/minor, demonstrating decreased sensitization over sessions. Pt is motivated throughout session and able to comply with all cuing for proper technique of therex. PT will continue progression as able.    Personal Factors and Comorbidities Age;Comorbidity 1;Past/Current Experience;Time since onset of injury/illness/exacerbation;Sex    Comorbidities R breast cancer    Examination-Activity Limitations Bathing;Sleep;Lift;Dressing;Reach Overhead;Hygiene/Grooming    Examination-Participation Restrictions Cleaning;Laundry;Yard Work;Community Activity    Stability/Clinical Decision Making Evolving/Moderate complexity    Clinical Decision Making Moderate    Rehab Potential Good    PT Frequency 2x / week    PT Duration 8 weeks    PT Treatment/Interventions ADLs/Self Care Home Management;Electrical Stimulation;Moist Heat;Iontophoresis 4mg /ml Dexamethasone;Traction;Ultrasound;Functional mobility training;Therapeutic activities;Therapeutic exercise;Neuromuscular re-education;Patient/family education;Manual techniques;Passive range of motion;Dry needling;Scar mobilization;Joint Manipulations    PT Next Visit Plan HEP review    PT  Home Exercise Plan AAROM pulleys, IR towel stretch, pec stretch    Consulted and Agree with Plan of Care Patient           Patient will benefit from skilled therapeutic intervention in order to improve the following deficits and impairments:  Decreased mobility,Hypomobility,Improper body mechanics,Impaired tone,Decreased range of motion,Decreased strength,Increased fascial restricitons,Impaired flexibility,Impaired UE functional use,Postural dysfunction,Pain  Visit Diagnosis: Chronic left shoulder pain  Stiffness of left shoulder, not elsewhere classified     Problem List Patient Active Problem List   Diagnosis Date Noted  . Breast cancer ([facility]) 03/21/2020  . Anemia due to antineoplastic chemotherapy 02/15/2020  . Cardiomyopathy ([facility]) 12/12/2019  . Dehydration 11/24/2019  . Acute pain of left shoulder 11/24/2019  . Orthostatic hypotension 10/12/2019  . Low serum cortisol level ([facility]) 09/29/2019  . Malignant neoplasm of breast in female, estrogen receptor negative ([facility]) 09/28/2019  . Hypokalemia 09/15/2019  . Hypocalcemia 09/15/2019  . Normocytic anemia 09/15/2019  . B12 deficiency 09/15/2019  . Iron deficiency anemia 09/15/2019  . Encounter for antineoplastic chemotherapy 08/25/2019  . Goals of care, counseling/discussion 08/17/2019  . Weight loss 07/31/2019  . Invasive carcinoma of breast ([facility]) 07/30/2019  . H/O diarrhea 01/01/2017   01/03/2017 DPT Hilda Lias 08/16/2020, 10:45 AM  [facility] [address], [address], [place] Phone: [phone]   Fax:  [phone]  Name: Heather Bell MRN: Nigel [id number] Date of Birth: 10-Sep-1975

## 2020-08-18 ENCOUNTER — Ambulatory Visit: Payer: BC Managed Care – PPO | Admitting: Physical Therapy

## 2020-08-22 ENCOUNTER — Encounter: Payer: BC Managed Care – PPO | Admitting: Physical Therapy

## 2020-08-23 ENCOUNTER — Ambulatory Visit: Payer: BC Managed Care – PPO | Attending: Sports Medicine | Admitting: Physical Therapy

## 2020-08-23 ENCOUNTER — Other Ambulatory Visit: Payer: Self-pay

## 2020-08-23 ENCOUNTER — Encounter: Payer: Self-pay | Admitting: Physical Therapy

## 2020-08-23 DIAGNOSIS — G8929 Other chronic pain: Secondary | ICD-10-CM | POA: Diagnosis present

## 2020-08-23 DIAGNOSIS — M25612 Stiffness of left shoulder, not elsewhere classified: Secondary | ICD-10-CM | POA: Insufficient documentation

## 2020-08-23 DIAGNOSIS — M25512 Pain in left shoulder: Secondary | ICD-10-CM | POA: Diagnosis not present

## 2020-08-23 NOTE — Therapy (Signed)
[facility] [address] [place], [address] Phone: [phone]   Fax:  [phone]  Physical Therapy Treatment  Patient Details  Name: Heather Bell MRN: [id number] Date of Birth: 19-Oct-1975 No data recorded  Encounter Date: 08/23/2020   PT End of Session - 08/23/20 0958    Visit Number 8    Number of Visits 17    Date for PT Re-Evaluation 09/23/20    PT Start Time 0953    PT Stop Time 1031    PT Time Calculation (min) 38 min    Activity Tolerance Patient tolerated treatment well;Patient limited by pain    Behavior During Therapy [facility] for tasks assessed/performed           Past Medical History:  Diagnosis Date  . Allergic rhinitis   . Anxiety   . Bipolar disorder ([place])    depression  . Breast cancer ([place]) 03/21/2020   Right mastectomy  . Cancer [facility])    breast right  . Depression   . Diabetes ([place])   . GERD (gastroesophageal reflux disease)   . Hyperlipidemia   . Personal history of chemotherapy    Jan 2021 through July 2021    Past Surgical History:  Procedure Laterality Date  . BREAST BIOPSY Right 07/22/2019   mass bx, path pending, heart marker  . BREAST BIOPSY Right 07/22/2019   LN bx, path pending,  butterfly hydromarker  . BREAST RECONSTRUCTION WITH PLACEMENT OF TISSUE EXPANDER AND FLEX HD (ACELLULAR HYDRATED DERMIS) Right 03/21/2020   Procedure: RIGHT BREAST RECONSTRUCTION WITH PLACEMENT OF TISSUE EXPANDER AND FLEX HD (ACELLULAR HYDRATED DERMIS);  Surgeon: [name], MD;  Location: [facility];  Service: Plastics;  Laterality: Right;  . IMAGE GUIDED SINUS SURGERY    . INCISION AND DRAINAGE OF WOUND Right 05/02/2020   Procedure: IRRIGATION AND DEBRIDEMENT WOUND WITH REMOVAL OF RIGHT TISSUE EXPANDER;  Surgeon: [name], MD;  Location: [place];  Service: Plastics;  Laterality: Right;  1 hour please  . KNEE SURGERY    . MASTECTOMY Right 03/21/2020  . NASAL SEPTUM  SURGERY    . PORTACATH PLACEMENT N/A 08/07/2019   Procedure: INSERTION PORT-A-CATH;  Surgeon: Herbert Pun, MD;  Location: [facility];  Service: General;  Laterality: N/A;  . TONSILLECTOMY    . TOTAL MASTECTOMY Right 03/21/2020   Procedure: TOTAL MASTECTOMY;  Surgeon: Herbert Pun, MD;  Location: [facility];  Service: General;  Laterality: Right;    There were no vitals filed for this visit.   Subjective Assessment - 08/23/20 0956    Subjective Patient reports no pain this morning. Some compliance with HEP.    Pertinent History Patient is a 45 year old female with L shoulder pain that began after a L port-a-cath was inserted last December for chemotherapy treatments for R breast cancer. Pt has concluded chemo treatments, but per onocologist is to keep port for a "little while longer". Has had R masectomy and R expander placement and removal (no current plans for reconstruction). Pt reports a steady increase in pain and loss of motion over time since the port was inserted. Pt has received cortisone shots in L shoulder for bursitis last July and stated it helps decrease pain for some time but did not last very [place]. Pt had an expander put in on R side post masectomy last year but the tissue died over it and is thinking about getting another one. Pt has difficulty and  pain with all movements and states only rest and no movement makes her L shoulder feel better. Pt scales the pain a 10/10 at worst and 1/10 at best when not moving it. Pt is sleeping on her back but depending on if she rolls over, her L shoulder can be painful in the AM. Patient denies any N/V, B&B, unexplained weight loss, night pain, fever, or night sweats.    Limitations Lifting;House hold activities    How [place] can you sit comfortably? unlimited    How [place] can you stand comfortably? unlimited    How [place] can you walk comfortably? unlimited    Patient Stated Goals decrease pain, be able to move LUE to PLOF    Pain Onset  More than a month ago              Ther-Ex Pulleys flex x15abd x15with cuing for scapulohumeral rhythm andcues to keep cervical in neutral  Supine AAROMIR <>ERx122-3sec holdwith dowel; minimal ER  Supine AAROM abd x12 2-3sec  Seated thoracic ext with AAROM flex with dowel x15 2-3sec hold   UE ranger clockwise and counterclockwise circles; and walking flexion x15 each direction  Seated tricep/lat stretch stretchx60sec  Low pecstretch standing in doorway60sec   AROM Flex 137 abd104 ER C6 IR L77midline  Manual PROM all directions G1-2 post CHJ mob in 60d abd G3post GHJmobswithmovement intoflex/abd STM to bicep (severe pain to minimal palpation), UT, and pec minor. During education on desensitization with good understanding. Ptguarded throughoutall PROM especially with ER and abd           PT Education - 08/23/20 0958    Education Details therex form/technique    Person(s) Educated Patient    Methods Explanation;Demonstration;Verbal cues    Comprehension Verbalized understanding;Returned demonstration;Verbal cues required            PT Short Term Goals - 07/25/20 1209      PT SHORT TERM GOAL #1   Title Patient will demonstrate independence with HEP to be able to decrease pain and improve L shoulder function    Baseline 07/25/20 HEP given    Time 4    Period Weeks    Status New    Target Date 08/22/20             PT [place] Term Goals - 07/25/20 1212      PT [place] TERM GOAL #1   Title Patient will decrease worst pain as reported on NPRS scale by 3 points to demonstrate a clinically significant reduction in pain.    Baseline 07/25/20 10/10 at worst    Time 8    Period Weeks    Status New    Target Date 09/19/20      PT [place] TERM GOAL #2   Title Patient will increase L shoulder AROM to be [facility] in order to complete ADLs and household activities without pain or difficulty    Baseline 07/25/20 flexion 109, ER base of  skull, IR S1, abd 45    Time 8    Period Weeks    Status New    Target Date 09/19/20      PT [place] TERM GOAL #3   Title Patient will increase L shoulder MMT strength to 4/5 to be able to raise arm overhead to complete household chores and return to PLOF    Baseline 07/25/20 flexion, abd, ER, ext 2+ IR 3    Time 8    Period Weeks    Status New  Target Date 09/19/20                 Plan - 08/23/20 1004    Clinical Impression Statement PT continued therex progression and manual techniques for increased mobility with success. Patient with some hypersensization to very gentle CHJ mobilizations, disallowing for higher grade mobilizations with movement. Patient with minimal increased ROM this session to last week, but is maintaining availible motion. PT will consider reducing frequency to 1x/week with robust HEP over the next 1-2 weeks if motion not not demonstrate steady improvements. Patient is able to comply with all cuing for proper technique of therex,with good motivation throughout session. PT will continue progression as able.    Personal Factors and Comorbidities Age;Comorbidity 1;Past/Current Experience;Time since onset of injury/illness/exacerbation;Sex    Comorbidities R breast cancer    Examination-Activity Limitations Bathing;Sleep;Lift;Dressing;Reach Overhead;Hygiene/Grooming    Examination-Participation Restrictions Cleaning;Laundry;Yard Work;Community Activity    Stability/Clinical Decision Making Evolving/Moderate complexity    Clinical Decision Making Moderate    Rehab Potential Good    PT Frequency 2x / week    PT Duration 8 weeks    PT Treatment/Interventions ADLs/Self Care Home Management;Electrical Stimulation;Moist Heat;Iontophoresis 4mg /ml Dexamethasone;Traction;Ultrasound;Functional mobility training;Therapeutic activities;Therapeutic exercise;Neuromuscular re-education;Patient/family education;Manual techniques;Passive range of motion;Dry needling;Scar  mobilization;Joint Manipulations;Other (comment)    PT Next Visit Plan HEP review    PT Home Exercise Plan AAROM pulleys, IR towel stretch, pec stretch    Consulted and Agree with Plan of Care Patient           Patient will benefit from skilled therapeutic intervention in order to improve the following deficits and impairments:  Decreased mobility,Hypomobility,Improper body mechanics,Impaired tone,Decreased range of motion,Decreased strength,Increased fascial restricitons,Impaired flexibility,Impaired UE functional use,Postural dysfunction,Pain  Visit Diagnosis: Chronic left shoulder pain  Stiffness of left shoulder, not elsewhere classified     Problem List Patient Active Problem List   Diagnosis Date Noted  . Breast cancer ([place]) 03/21/2020  . Anemia due to antineoplastic chemotherapy 02/15/2020  . Cardiomyopathy ([place]) 12/12/2019  . Dehydration 11/24/2019  . Acute pain of left shoulder 11/24/2019  . Orthostatic hypotension 10/12/2019  . Low serum cortisol level ([place]) 09/29/2019  . Malignant neoplasm of breast in female, estrogen receptor negative ([place]) 09/28/2019  . Hypokalemia 09/15/2019  . Hypocalcemia 09/15/2019  . Normocytic anemia 09/15/2019  . B12 deficiency 09/15/2019  . Iron deficiency anemia 09/15/2019  . Encounter for antineoplastic chemotherapy 08/25/2019  . Goals of care, counseling/discussion 08/17/2019  . Weight loss 07/31/2019  . Invasive carcinoma of breast ([place]) 07/30/2019  . H/O diarrhea 01/01/2017   Durwin Reges DPT Durwin Reges 08/23/2020, 10:30 AM  [facility] [address], [place], [address] Phone: [phone]   Fax:  [phone]  Name: Heather Bell MRN: [id number] Date of Birth: [birth year]

## 2020-08-24 ENCOUNTER — Encounter: Payer: BC Managed Care – PPO | Admitting: Physical Therapy

## 2020-08-25 ENCOUNTER — Ambulatory Visit: Payer: BC Managed Care – PPO | Admitting: Physical Therapy

## 2020-08-25 ENCOUNTER — Other Ambulatory Visit: Payer: Self-pay

## 2020-08-25 ENCOUNTER — Encounter: Payer: Self-pay | Admitting: Physical Therapy

## 2020-08-25 DIAGNOSIS — G8929 Other chronic pain: Secondary | ICD-10-CM

## 2020-08-25 DIAGNOSIS — M25512 Pain in left shoulder: Secondary | ICD-10-CM | POA: Diagnosis not present

## 2020-08-25 DIAGNOSIS — M25612 Stiffness of left shoulder, not elsewhere classified: Secondary | ICD-10-CM

## 2020-08-25 NOTE — Therapy (Signed)
[facility] [address] [place], [address] Phone: [phone]   Fax:  [phone]  Physical Therapy Treatment  Patient Details  Name: Heather Bell MRN: [id number] Date of Birth: [birth year] No data recorded  Encounter Date: 08/25/2020   PT End of Session - 08/25/20 1122    Visit Number 9    Number of Visits 17    Date for PT Re-Evaluation 09/23/20    PT Start Time 1115    PT Stop Time 1200    PT Time Calculation (min) 45 min    Activity Tolerance Patient tolerated treatment well;Patient limited by pain    Behavior During Therapy [facility] for tasks assessed/performed           Past Medical History:  Diagnosis Date  . Allergic rhinitis   . Anxiety   . Bipolar disorder ([place])    depression  . Breast cancer ([place]) 03/21/2020   Right mastectomy  . Cancer [facility])    breast right  . Depression   . Diabetes ([place])   . GERD (gastroesophageal reflux disease)   . Hyperlipidemia   . Personal history of chemotherapy    Jan 2021 through July 2021    Past Surgical History:  Procedure Laterality Date  . BREAST BIOPSY Right 07/22/2019   mass bx, path pending, heart marker  . BREAST BIOPSY Right 07/22/2019   LN bx, path pending,  butterfly hydromarker  . BREAST RECONSTRUCTION WITH PLACEMENT OF TISSUE EXPANDER AND FLEX HD (ACELLULAR HYDRATED DERMIS) Right 03/21/2020   Procedure: RIGHT BREAST RECONSTRUCTION WITH PLACEMENT OF TISSUE EXPANDER AND FLEX HD (ACELLULAR HYDRATED DERMIS);  Surgeon: [name], MD;  Location: [facility];  Service: Plastics;  Laterality: Right;  . IMAGE GUIDED SINUS SURGERY    . INCISION AND DRAINAGE OF WOUND Right 05/02/2020   Procedure: IRRIGATION AND DEBRIDEMENT WOUND WITH REMOVAL OF RIGHT TISSUE EXPANDER;  Surgeon: [name], MD;  Location: [place];  Service: Plastics;  Laterality: Right;  1 hour please  . KNEE SURGERY    . MASTECTOMY Right 03/21/2020  . NASAL SEPTUM  SURGERY    . PORTACATH PLACEMENT N/A 08/07/2019   Procedure: INSERTION PORT-A-CATH;  Surgeon: Herbert Pun, MD;  Location: [facility];  Service: General;  Laterality: N/A;  . TONSILLECTOMY    . TOTAL MASTECTOMY Right 03/21/2020   Procedure: TOTAL MASTECTOMY;  Surgeon: Herbert Pun, MD;  Location: [facility];  Service: General;  Laterality: Right;    There were no vitals filed for this visit.   Subjective Assessment - 08/25/20 1116    Subjective Patient reports increased soreness pain this am 3/10, with increased difficulty with HEP yesterday. Reports she does feel like she is getting more motion recently, which she is happy with.    Pertinent History Patient is a 45 year old female with L shoulder pain that began after a L port-a-cath was inserted last December for chemotherapy treatments for R breast cancer. Pt has concluded chemo treatments, but per onocologist is to keep port for a "little while longer". Has had R masectomy and R expander placement and removal (no current plans for reconstruction). Pt reports a steady increase in pain and loss of motion over time since the port was inserted. Pt has received cortisone shots in L shoulder for bursitis last July and stated it helps decrease pain for some time but did not last very [place]. Pt had an expander put in on R side post  masectomy last year but the tissue died over it and is thinking about getting another one. Pt has difficulty and pain with all movements and states only rest and no movement makes her L shoulder feel better. Pt scales the pain a 10/10 at worst and 1/10 at best when not moving it. Pt is sleeping on her back but depending on if she rolls over, her L shoulder can be painful in the AM. Patient denies any N/V, B&B, unexplained weight loss, night pain, fever, or night sweats.    Limitations Lifting;House hold activities    How [place] can you sit comfortably? unlimited    How G[place] can you stand comfortably? unlimited    How  [place] can you walk comfortably? unlimited    Patient Stated Goals decrease pain, be able to move LUE to PLOF           Ther-Ex Pulleys flex x15abd x15with cuing for scapulohumeral rhythm andcues to keep cervical in neutral  Supine AAROMIR <>ERx122-3sec holdwith dowel; minimal ER  Supine AAROM abd x12 2-3sec  Y on wall 2x 12 with min cuing for technique with scapular retraction with good carry over  RTB pull aparts (scap retractions) 2x 10 with cuing initially for technique with good carry over following  RTB bilat ER (minimal ER available) 2x 10 with good carry over of demo   Seated thoracic ext with AAROM flex with dowel x15 2-3sec hold   Seated tricep/lat stretch stretchx60sec  AROM Flex145 abd113 ER C6 IR L1/T12  Manual PROM all directions G1-2 post CHJ mob in 60d abd G3post GHJmobswithmovement intoflex/abd STM to bicep (severe pain to minimal palpation), UT, and pec minor. During education on desensitization with good understanding. Ptguarded throughoutall PROM especially with ER and abd           PT Education - 08/25/20 1122    Education Details therex form/technique    Person(s) Educated Patient    Methods Explanation;Demonstration;Verbal cues    Comprehension Verbalized understanding;Returned demonstration;Verbal cues required            PT Short Term Goals - 07/25/20 1209      PT SHORT TERM GOAL #1   Title Patient will demonstrate independence with HEP to be able to decrease pain and improve L shoulder function    Baseline 07/25/20 HEP given    Time 4    Period Weeks    Status New    Target Date 08/22/20             PT Vaux Term Goals - 07/25/20 1212      PT Masley TERM GOAL #1   Title Patient will decrease worst pain as reported on NPRS scale by 3 points to demonstrate a clinically significant reduction in pain.    Baseline 07/25/20 10/10 at worst    Time 8    Period Weeks    Status New    Target Date  09/19/20      PT Lovett TERM GOAL #2   Title Patient will increase L shoulder AROM to be [facility] in order to complete ADLs and household activities without pain or difficulty    Baseline 07/25/20 flexion 109, ER base of skull, IR S1, abd 45    Time 8    Period Weeks    Status New    Target Date 09/19/20      PT Danzer TERM GOAL #3   Title Patient will increase L shoulder MMT strength to 4/5 to be able to raise  arm overhead to complete household chores and return to PLOF    Baseline 07/25/20 flexion, abd, ER, ext 2+ IR 3    Time 8    Period Weeks    Status New    Target Date 09/19/20                 Plan - 08/25/20 1143    Clinical Impression Statement PT continued therex progression and manual techniques for increased ability with continued success. Patient is continuing to demonstrate increased motion between sessions. Patient is motivated throughout session with good carry over of all cuing for proper technique of therex with no increased pain throughout session. PT will continue progression as able.    Personal Factors and Comorbidities Age;Comorbidity 1;Past/Current Experience;Time since onset of injury/illness/exacerbation;Sex    Comorbidities R breast cancer    Examination-Activity Limitations Bathing;Sleep;Lift;Dressing;Reach Overhead;Hygiene/Grooming    Examination-Participation Restrictions Cleaning;Laundry;Yard Work;Community Activity    Stability/Clinical Decision Making Evolving/Moderate complexity    Clinical Decision Making Moderate    Rehab Potential Good    PT Frequency 2x / week    PT Duration 8 weeks    PT Treatment/Interventions ADLs/Self Care Home Management;Electrical Stimulation;Moist Heat;Iontophoresis 4mg /ml Dexamethasone;Traction;Ultrasound;Functional mobility training;Therapeutic activities;Therapeutic exercise;Neuromuscular re-education;Patient/family education;Manual techniques;Passive range of motion;Dry needling;Scar mobilization;Joint Manipulations;Other  (comment)    PT Next Visit Plan HEP review    PT Home Exercise Plan AAROM pulleys, IR towel stretch, pec stretch    Consulted and Agree with Plan of Care Patient           Patient will benefit from skilled therapeutic intervention in order to improve the following deficits and impairments:  Decreased mobility,Hypomobility,Improper body mechanics,Impaired tone,Decreased range of motion,Decreased strength,Increased fascial restricitons,Impaired flexibility,Impaired UE functional use,Postural dysfunction,Pain  Visit Diagnosis: Chronic left shoulder pain  Stiffness of left shoulder, not elsewhere classified     Problem List Patient Active Problem List   Diagnosis Date Noted  . Breast cancer (H[facility]) 03/21/2020  . Anemia due to antineoplastic chemotherapy 02/15/2020  . Cardiomyopathy (HCC) 12/12/2019  . Dehydration 11/24/2019  . Acute pain of left shoulder 11/24/2019  . Orthostatic hypotension 10/12/2019  . Low serum cortisol level (HCC) 09/29/2019  . Malignant neoplasm of breast in female, estrogen receptor negative (HCC) 09/28/2019  . Hypokalemia 09/15/2019  . Hypocalcemia 09/15/2019  . Normocytic anemia 09/15/2019  . B12 deficiency 09/15/2019  . Iron deficiency anemia 09/15/2019  . Encounter for antineoplastic chemotherapy 08/25/2019  . Goals of care, counseling/discussion 08/17/2019  . Weight loss 07/31/2019  . Invasive carcinoma of breast (HCC) 07/30/2019  . H/O diarrhea 01/01/2017   01/03/2017 DPT  Hilda Lias 08/25/2020, 11:56 AM  [facility] [address], [address], [place] Phone: [phone]   Fax:  [phone]  Name: Heather Bell MRN: Nigel Mormon Date of Birth: 08-01-1976

## 2020-08-29 ENCOUNTER — Other Ambulatory Visit: Payer: Self-pay

## 2020-08-29 ENCOUNTER — Ambulatory Visit: Payer: BC Managed Care – PPO | Admitting: Physical Therapy

## 2020-08-29 ENCOUNTER — Encounter: Payer: BC Managed Care – PPO | Admitting: Physical Therapy

## 2020-08-29 ENCOUNTER — Encounter: Payer: Self-pay | Admitting: Physical Therapy

## 2020-08-29 DIAGNOSIS — G8929 Other chronic pain: Secondary | ICD-10-CM

## 2020-08-29 DIAGNOSIS — M25512 Pain in left shoulder: Secondary | ICD-10-CM | POA: Diagnosis not present

## 2020-08-29 DIAGNOSIS — M25612 Stiffness of left shoulder, not elsewhere classified: Secondary | ICD-10-CM

## 2020-08-29 NOTE — Therapy (Signed)
[facility] [address], [place], [address] Phone: [phone]   Fax:  [phone]  Physical Therapy Treatment  Patient Details  Name: Heather Bell MRN: [id number] Date of Birth: Apr 15, 1976 No data recorded  Encounter Date: 08/29/2020   PT End of Session - 08/29/20 0953    Visit Number 10    Number of Visits 17    Date for PT Re-Evaluation 09/23/20    PT Start Time 0945    PT Stop Time 1030    PT Time Calculation (min) 45 min    Activity Tolerance Patient tolerated treatment well;Patient limited by pain    Behavior During Therapy [facility] for tasks assessed/performed           Past Medical History:  Diagnosis Date  . Allergic rhinitis   . Anxiety   . Bipolar disorder ([place])    depression  . Breast cancer ([place]) 03/21/2020   Right mastectomy  . Cancer [facility])    breast right  . Depression   . Diabetes ([place])   . GERD (gastroesophageal reflux disease)   . Hyperlipidemia   . Personal history of chemotherapy    Jan 2021 through July 2021    Past Surgical History:  Procedure Laterality Date  . BREAST BIOPSY Right 07/22/2019   mass bx, path pending, heart marker  . BREAST BIOPSY Right 07/22/2019   LN bx, path pending,  butterfly hydromarker  . BREAST RECONSTRUCTION WITH PLACEMENT OF TISSUE EXPANDER AND FLEX HD (ACELLULAR HYDRATED DERMIS) Right 03/21/2020   Procedure: RIGHT BREAST RECONSTRUCTION WITH PLACEMENT OF TISSUE EXPANDER AND FLEX HD (ACELLULAR HYDRATED DERMIS);  Surgeon: [name], MD;  Location: [facility];  Service: Plastics;  Laterality: Right;  . IMAGE GUIDED SINUS SURGERY    . INCISION AND DRAINAGE OF WOUND Right 05/02/2020   Procedure: IRRIGATION AND DEBRIDEMENT WOUND WITH REMOVAL OF RIGHT TISSUE EXPANDER;  Surgeon: [name], MD;  Location: [place];  Service: Plastics;  Laterality: Right;  1 hour please  . KNEE SURGERY    . MASTECTOMY Right 03/21/2020  . NASAL  SEPTUM SURGERY    . PORTACATH PLACEMENT N/A 08/07/2019   Procedure: INSERTION PORT-A-CATH;  Surgeon: Herbert Pun, MD;  Location: [facility];  Service: General;  Laterality: N/A;  . TONSILLECTOMY    . TOTAL MASTECTOMY Right 03/21/2020   Procedure: TOTAL MASTECTOMY;  Surgeon: Herbert Pun, MD;  Location: [facility];  Service: General;  Laterality: Right;    There were no vitals filed for this visit.   Subjective Assessment - 08/29/20 0947    Subjective Patient reports some pain over the weekend 3/10, sore at baseline, with sharp pain with movement. Compliance with HEP.    Pertinent History Patient is a 45 year old female with L shoulder pain that began after a L port-a-cath was inserted last December for chemotherapy treatments for R breast cancer. Pt has concluded chemo treatments, but per onocologist is to keep port for a "little while longer". Has had R masectomy and R expander placement and removal (no current plans for reconstruction). Pt reports a steady increase in pain and loss of motion over time since the port was inserted. Pt has received cortisone shots in L shoulder for bursitis last July and stated it helps decrease pain for some time but did not last very [place]. Pt had an expander put in on R side post masectomy last year but the tissue died over it and is  thinking about getting another one. Pt has difficulty and pain with all movements and states only rest and no movement makes her L shoulder feel better. Pt scales the pain a 10/10 at worst and 1/10 at best when not moving it. Pt is sleeping on her back but depending on if she rolls over, her L shoulder can be painful in the AM. Patient denies any N/V, B&B, unexplained weight loss, night pain, fever, or night sweats.    Limitations Lifting;House hold activities    How [place] can you sit comfortably? unlimited    How [place] can you stand comfortably? unlimited    How [place] can you walk comfortably? unlimited    Patient Stated  Goals decrease pain, be able to move LUE to PLOF    Pain Onset More than a month ago              Ther-Ex Pulleys flex x15abd x15with cuing for scapulohumeral rhythm andcues to keep cervical in neutral  Supine AAROMIR <>ERx122-3sec holdwith dowel; minimal ER  Supine AAROM abd x122-3sec  Seated thoracic ext with AAROM flex with dowel x15 2-3sec hold   UE Ranger flex walks x12 with 3sec hold at end range UE ranger clockwise cir x15 counterclockwise x15  Y on wall 2x 12 with min cuing for technique with scapular retraction with good carry over  RTB pull aparts (scap retractions) 2x 10 with cuing initially for technique with good carry over following  Seated tricep/lat stretch stretchx60sec  AROM Flex152 abd110 ER C6 IR L1/T12  Manual PROM all directions G1-2 post CHJ mob in 60d abd G3post GHJmobswithmovement intoflex/abd STM to bicep (severe pain to minimal palpation), UT, and pec minor. During education on desensitization with good understanding. Ptguarded throughoutall PROM especially with ER and abd           PT Education - 08/29/20 0953    Education Details therex form/technique    Person(s) Educated Patient    Methods Explanation;Demonstration;Tactile cues;Verbal cues    Comprehension Verbalized understanding;Returned demonstration;Verbal cues required;Tactile cues required            PT Short Term Goals - 07/25/20 1209      PT SHORT TERM GOAL #1   Title Patient will demonstrate independence with HEP to be able to decrease pain and improve L shoulder function    Baseline 07/25/20 HEP given    Time 4    Period Weeks    Status New    Target Date 08/22/20             PT Pett[place]ohn Term Goals - 07/25/20 1212      PT Snedeker TERM GOAL #1   Title Patient will decrease worst pain as reported on NPRS scale by 3 points to demonstrate a clinically significant reduction in pain.    Baseline 07/25/20 10/10 at worst    Time 8     Period Weeks    Status New    Target Date 09/19/20      PT Ricotta TERM GOAL #2   Title Patient will increase L shoulder AROM to be [facility] in order to complete ADLs and household activities without pain or difficulty    Baseline 07/25/20 flexion 109, ER base of skull, IR S1, abd 45    Time 8    Period Weeks    Status New    Target Date 09/19/20      PT Aguilar TERM GOAL #3   Title Patient will increase L shoulder MMT strength  to 4/5 to be able to raise arm overhead to complete household chores and return to PLOF    Baseline 07/25/20 flexion, abd, ER, ext 2+ IR 3    Time 8    Period Weeks    Status New    Target Date 09/19/20                 Plan - 08/29/20 1002    Clinical Impression Statement PT continued therex progression with manual techniques for increased mobility and scapulohumeral rhythm with success. Patient is able to demonstrate steady progression of mobility with increased tolerance to amnual techniques. Pt is able to comply with all cuing for proper technique of therex with good motivation and no increased pain. PT will continue progression as able.    Personal Factors and Comorbidities Age;Comorbidity 1;Past/Current Experience;Time since onset of injury/illness/exacerbation;Sex    Examination-Activity Limitations Bathing;Sleep;Lift;Dressing;Reach Overhead;Hygiene/Grooming    Examination-Participation Restrictions Cleaning;Laundry;Yard Work;Community Activity    Stability/Clinical Decision Making Evolving/Moderate complexity    Clinical Decision Making Moderate    Rehab Potential Good    PT Frequency 2x / week    PT Duration 8 weeks    PT Treatment/Interventions ADLs/Self Care Home Management;Electrical Stimulation;Moist Heat;Iontophoresis 4mg /ml Dexamethasone;Traction;Ultrasound;Functional mobility training;Therapeutic activities;Therapeutic exercise;Neuromuscular re-education;Patient/family education;Manual techniques;Passive range of motion;Dry needling;Scar  mobilization;Joint Manipulations;Other (comment)    PT Next Visit Plan HEP review    PT Home Exercise Plan AAROM pulleys, IR towel stretch, pec stretch    Consulted and Agree with Plan of Care Patient           Patient will benefit from skilled therapeutic intervention in order to improve the following deficits and impairments:  Decreased mobility,Hypomobility,Improper body mechanics,Impaired tone,Decreased range of motion,Decreased strength,Increased fascial restricitons,Impaired flexibility,Impaired UE functional use,Postural dysfunction,Pain  Visit Diagnosis: Chronic left shoulder pain  Stiffness of left shoulder, not elsewhere classified     Problem List Patient Active Problem List   Diagnosis Date Noted  . Breast cancer ([place]) 03/21/2020  . Anemia due to antineoplastic chemotherapy 02/15/2020  . Cardiomyopathy ([place]) 12/12/2019  . Dehydration 11/24/2019  . Acute pain of left shoulder 11/24/2019  . Orthostatic hypotension 10/12/2019  . Low serum cortisol level ([place]) 09/29/2019  . Malignant neoplasm of breast in female, estrogen receptor negative ([place]) 09/28/2019  . Hypokalemia 09/15/2019  . Hypocalcemia 09/15/2019  . Normocytic anemia 09/15/2019  . B12 deficiency 09/15/2019  . Iron deficiency anemia 09/15/2019  . Encounter for antineoplastic chemotherapy 08/25/2019  . Goals of care, counseling/discussion 08/17/2019  . Weight loss 07/31/2019  . Invasive carcinoma of breast ([place]) 07/30/2019  . H/O diarrhea 01/01/2017   Durwin Reges DPT Durwin Reges 08/29/2020, 10:23 AM  [facility] [address] [place], [address] Phone: [phone]   Fax:  [phone]  Name: Heather Bell MRN: [id number] Date of Birth: Nov 27, 1975

## 2020-08-31 ENCOUNTER — Other Ambulatory Visit: Payer: Self-pay

## 2020-08-31 ENCOUNTER — Ambulatory Visit: Payer: BC Managed Care – PPO | Admitting: Physical Therapy

## 2020-08-31 ENCOUNTER — Encounter: Payer: BC Managed Care – PPO | Admitting: Physical Therapy

## 2020-08-31 ENCOUNTER — Encounter: Payer: Self-pay | Admitting: Physical Therapy

## 2020-08-31 DIAGNOSIS — M25612 Stiffness of left shoulder, not elsewhere classified: Secondary | ICD-10-CM

## 2020-08-31 DIAGNOSIS — G8929 Other chronic pain: Secondary | ICD-10-CM

## 2020-08-31 DIAGNOSIS — M25512 Pain in left shoulder: Secondary | ICD-10-CM

## 2020-08-31 NOTE — Therapy (Signed)
[place] [address] [place], [address] Phone: [phone]   Fax:  [phone]  Physical Therapy Treatment  Patient Details  Name: Heather Bell MRN: [id number] Date of Birth: 1976/03/28 No data recorded  Encounter Date: 08/31/2020   PT End of Session - 08/31/20 1041    Visit Number 11    Number of Visits 17    Date for PT Re-Evaluation 09/23/20    PT Start Time 1034    PT Stop Time 1113    PT Time Calculation (min) 39 min    Activity Tolerance Patient tolerated treatment well;Patient limited by pain    Behavior During Therapy [facility] for tasks assessed/performed           Past Medical History:  Diagnosis Date  . Allergic rhinitis   . Anxiety   . Bipolar disorder ([place])    depression  . Breast cancer ([place]) 03/21/2020   Right mastectomy  . Cancer [facility])    breast right  . Depression   . Diabetes ([place])   . GERD (gastroesophageal reflux disease)   . Hyperlipidemia   . Personal history of chemotherapy    Jan 2021 through July 2021    Past Surgical History:  Procedure Laterality Date  . BREAST BIOPSY Right 07/22/2019   mass bx, path pending, heart marker  . BREAST BIOPSY Right 07/22/2019   LN bx, path pending,  butterfly hydromarker  . BREAST RECONSTRUCTION WITH PLACEMENT OF TISSUE EXPANDER AND FLEX HD (ACELLULAR HYDRATED DERMIS) Right 03/21/2020   Procedure: RIGHT BREAST RECONSTRUCTION WITH PLACEMENT OF TISSUE EXPANDER AND FLEX HD (ACELLULAR HYDRATED DERMIS);  Surgeon: [name], MD;  Location: [facility];  Service: Plastics;  Laterality: Right;  . IMAGE GUIDED SINUS SURGERY    . INCISION AND DRAINAGE OF WOUND Right 05/02/2020   Procedure: IRRIGATION AND DEBRIDEMENT WOUND WITH REMOVAL OF RIGHT TISSUE EXPANDER;  Surgeon: [name], MD;  Location: [place];  Service: Plastics;  Laterality: Right;  1 hour please  . KNEE SURGERY    . MASTECTOMY Right 03/21/2020  . NASAL  SEPTUM SURGERY    . PORTACATH PLACEMENT N/A 08/07/2019   Procedure: INSERTION PORT-A-CATH;  Surgeon: Herbert Pun, MD;  Location: [facility];  Service: General;  Laterality: N/A;  . TONSILLECTOMY    . TOTAL MASTECTOMY Right 03/21/2020   Procedure: TOTAL MASTECTOMY;  Surgeon: Herbert Pun, MD;  Location: [facility];  Service: General;  Laterality: Right;    There were no vitals filed for this visit.   Subjective Assessment - 08/31/20 1039    Subjective Reports she is doing well today. Nothing new to note. 2/10 pain this am.    Pertinent History Patient is a 45 year old female with L shoulder pain that began after a L port-a-cath was inserted last December for chemotherapy treatments for R breast cancer. Pt has concluded chemo treatments, but per onocologist is to keep port for a "little while longer". Has had R masectomy and R expander placement and removal (no current plans for reconstruction). Pt reports a steady increase in pain and loss of motion over time since the port was inserted. Pt has received cortisone shots in L shoulder for bursitis last July and stated it helps decrease pain for some time but did not last very [place]. Pt had an expander put in on R side post masectomy last year but the tissue died over it and is thinking about getting another one.  Pt has difficulty and pain with all movements and states only rest and no movement makes her L shoulder feel better. Pt scales the pain a 10/10 at worst and 1/10 at best when not moving it. Pt is sleeping on her back but depending on if she rolls over, her L shoulder can be painful in the AM. Patient denies any N/V, B&B, unexplained weight loss, night pain, fever, or night sweats.    Limitations Lifting;House hold activities    How Primm can you sit comfortably? unlimited    How [place] can you stand comfortably? unlimited    How Levings can you walk comfortably? unlimited    Patient Stated Goals decrease pain, be able to move LUE to PLOF     Pain Onset More than a month ago              Ther-Ex Pulleys flex x15abd x15with cuing for scapulohumeral rhythm andcues to keep cervical in neutral  Supine AAROMIR <>ERx122-3sec holdwith dowel; minimal ER  Supine AAROM abd x122-3sec  Supine flex 1# 2x 10 with min cuing for eccentric lower with good carry over  Sidelying abd 1# 2x 10   Y on wall 1# 2x 10 with good carry over folloiwng min cuing for technique  Seated tricep/lat stretch stretchx60sec  AROM Flex160 abd130 ER C6 IRT12  Manual PROM all directions G1-2 post CHJ mob in 60d abd G3post GHJmobswithmovement intoflex/abd/ER/IR STM to bicep (severe pain to minimal palpation), UT, and pec minor.  Ptguarded throughoutall PROM especially with ER and abd                PT Short Term Goals - 07/25/20 1209      PT SHORT TERM GOAL #1   Title Patient will demonstrate independence with HEP to be able to decrease pain and improve L shoulder function    Baseline 07/25/20 HEP given    Time 4    Period Weeks    Status New    Target Date 08/22/20             PT Carillo Term Goals - 07/25/20 1212      PT Freiman TERM GOAL #1   Title Patient will decrease worst pain as reported on NPRS scale by 3 points to demonstrate a clinically significant reduction in pain.    Baseline 07/25/20 10/10 at worst    Time 8    Period Weeks    Status New    Target Date 09/19/20      PT Widjaja TERM GOAL #2   Title Patient will increase L shoulder AROM to be [facility] in order to complete ADLs and household activities without pain or difficulty    Baseline 07/25/20 flexion 109, ER base of skull, IR S1, abd 45    Time 8    Period Weeks    Status New    Target Date 09/19/20      PT [place] TERM GOAL #3   Title Patient will increase L shoulder MMT strength to 4/5 to be able to raise arm overhead to complete household chores and return to PLOF    Baseline 07/25/20 flexion, abd, ER, ext 2+ IR 3    Time 8     Period Weeks    Status New    Target Date 09/19/20                 Plan - 08/31/20 1055    Clinical Impression Statement PT continued therex progression with manual techniques  for increasd mobility, strength, and scapulohumeral rhythm. Pt is able to comply with all cuing for proper technique of therex with good motivation throughout session and minimal increase in pain. Patinet is continuing to tolerate increased intensity of manual techniques as well. PT will continue progression as able.    Personal Factors and Comorbidities Age;Comorbidity 1;Past/Current Experience;Time since onset of injury/illness/exacerbation;Sex    Comorbidities R breast cancer    Examination-Activity Limitations Bathing;Sleep;Lift;Dressing;Reach Overhead;Hygiene/Grooming    Examination-Participation Restrictions Cleaning;Laundry;Yard Work;Community Activity    Stability/Clinical Decision Making Evolving/Moderate complexity    Clinical Decision Making Moderate    Rehab Potential Good    PT Frequency 2x / week    PT Duration 8 weeks    PT Treatment/Interventions ADLs/Self Care Home Management;Electrical Stimulation;Moist Heat;Iontophoresis 4mg /ml Dexamethasone;Traction;Ultrasound;Functional mobility training;Therapeutic activities;Therapeutic exercise;Neuromuscular re-education;Patient/family education;Manual techniques;Passive range of motion;Dry needling;Scar mobilization;Joint Manipulations;Other (comment)    PT Next Visit Plan HEP review    PT Home Exercise Plan AAROM pulleys, IR towel stretch, pec stretch    Consulted and Agree with Plan of Care Patient           Patient will benefit from skilled therapeutic intervention in order to improve the following deficits and impairments:  Decreased mobility,Hypomobility,Improper body mechanics,Impaired tone,Decreased range of motion,Decreased strength,Increased fascial restricitons,Impaired flexibility,Impaired UE functional use,Postural  dysfunction,Pain  Visit Diagnosis: Chronic left shoulder pain  Stiffness of left shoulder, not elsewhere classified     Problem List Patient Active Problem List   Diagnosis Date Noted  . Breast cancer ([place]) 03/21/2020  . Anemia due to antineoplastic chemotherapy 02/15/2020  . Cardiomyopathy ([place]) 12/12/2019  . Dehydration 11/24/2019  . Acute pain of left shoulder 11/24/2019  . Orthostatic hypotension 10/12/2019  . Low serum cortisol level ([place]) 09/29/2019  . Malignant neoplasm of breast in female, estrogen receptor negative ([place]) 09/28/2019  . Hypokalemia 09/15/2019  . Hypocalcemia 09/15/2019  . Normocytic anemia 09/15/2019  . B12 deficiency 09/15/2019  . Iron deficiency anemia 09/15/2019  . Encounter for antineoplastic chemotherapy 08/25/2019  . Goals of care, counseling/discussion 08/17/2019  . Weight loss 07/31/2019  . Invasive carcinoma of breast ([place]) 07/30/2019  . H/O diarrhea 01/01/2017   Durwin Reges DPT Durwin Reges 08/31/2020, 11:16 AM  [place] [facility] [address] [place], [address] Phone: [phone]   Fax:  [phone]  Name: Heather Bell MRN: [id number] Date of Birth: 15-Apr-1976

## 2020-09-05 ENCOUNTER — Encounter: Payer: BC Managed Care – PPO | Admitting: Physical Therapy

## 2020-09-06 ENCOUNTER — Ambulatory Visit: Payer: BC Managed Care – PPO | Admitting: Physical Therapy

## 2020-09-08 ENCOUNTER — Encounter: Payer: BC Managed Care – PPO | Admitting: Physical Therapy

## 2020-09-09 ENCOUNTER — Ambulatory Visit: Payer: BC Managed Care – PPO | Admitting: Physical Therapy

## 2020-09-09 ENCOUNTER — Other Ambulatory Visit: Payer: Self-pay

## 2020-09-09 ENCOUNTER — Encounter: Payer: Self-pay | Admitting: Physical Therapy

## 2020-09-09 DIAGNOSIS — M25512 Pain in left shoulder: Secondary | ICD-10-CM | POA: Diagnosis not present

## 2020-09-09 NOTE — Therapy (Signed)
[place] [facility] [address], [place], [address] Phone: [phone]   Fax:  [phone]  Physical Therapy Treatment  Patient Details  Name: Heather Bell MRN: [id number] Date of Birth: 1976-02-06 No data recorded  Encounter Date: 09/09/2020   PT End of Session - 09/09/20 0921    Visit Number 12    Number of Visits 17    Date for PT Re-Evaluation 09/23/20    Authorization - Visit Number 12    Authorization - Number of Visits 17    PT Start Time 0917    PT Stop Time 1000    PT Time Calculation (min) 43 min    Activity Tolerance Patient tolerated treatment well;Patient limited by pain    Behavior During Therapy [facility] for tasks assessed/performed           Past Medical History:  Diagnosis Date  . Allergic rhinitis   . Anxiety   . Bipolar disorder ([place])    depression  . Breast cancer ([place]) 03/21/2020   Right mastectomy  . Cancer [facility])    breast right  . Depression   . Diabetes ([place])   . GERD (gastroesophageal reflux disease)   . Hyperlipidemia   . Personal history of chemotherapy    Jan 2021 through July 2021    Past Surgical History:  Procedure Laterality Date  . BREAST BIOPSY Right 07/22/2019   mass bx, path pending, heart marker  . BREAST BIOPSY Right 07/22/2019   LN bx, path pending,  butterfly hydromarker  . BREAST RECONSTRUCTION WITH PLACEMENT OF TISSUE EXPANDER AND FLEX HD (ACELLULAR HYDRATED DERMIS) Right 03/21/2020   Procedure: RIGHT BREAST RECONSTRUCTION WITH PLACEMENT OF TISSUE EXPANDER AND FLEX HD (ACELLULAR HYDRATED DERMIS);  Surgeon: [name], MD;  Location: [facility];  Service: Plastics;  Laterality: Right;  . IMAGE GUIDED SINUS SURGERY    . INCISION AND DRAINAGE OF WOUND Right 05/02/2020   Procedure: IRRIGATION AND DEBRIDEMENT WOUND WITH REMOVAL OF RIGHT TISSUE EXPANDER;  Surgeon: [name], MD;  Location: [place];  Service: Plastics;  Laterality: Right;  1  hour please  . KNEE SURGERY    . MASTECTOMY Right 03/21/2020  . NASAL SEPTUM SURGERY    . PORTACATH PLACEMENT N/A 08/07/2019   Procedure: INSERTION PORT-A-CATH;  Surgeon: Herbert Pun, MD;  Location: [facility];  Service: General;  Laterality: N/A;  . TONSILLECTOMY    . TOTAL MASTECTOMY Right 03/21/2020   Procedure: TOTAL MASTECTOMY;  Surgeon: Herbert Pun, MD;  Location: [facility];  Service: General;  Laterality: Right;    There were no vitals filed for this visit.   Subjective Assessment - 09/09/20 0916    Subjective Reports some pain this am 3/10; otherwise doing well.    Pertinent History Patient is a 45 year old female with L shoulder pain that began after a L port-a-cath was inserted last December for chemotherapy treatments for R breast cancer. Pt has concluded chemo treatments, but per onocologist is to keep port for a "little while longer". Has had R masectomy and R expander placement and removal (no current plans for reconstruction). Pt reports a steady increase in pain and loss of motion over time since the port was inserted. Pt has received cortisone shots in L shoulder for bursitis last July and stated it helps decrease pain for some time but did not last very [place]. Pt had an expander put in on R side post masectomy last year but  the tissue died over it and is thinking about getting another one. Pt has difficulty and pain with all movements and states only rest and no movement makes her L shoulder feel better. Pt scales the pain a 10/10 at worst and 1/10 at best when not moving it. Pt is sleeping on her back but depending on if she rolls over, her L shoulder can be painful in the AM. Patient denies any N/V, B&B, unexplained weight loss, night pain, fever, or night sweats.    Limitations Lifting;House hold activities    How [place] can you sit comfortably? unlimited    How [place] can you stand comfortably? unlimited    How Pae can you walk comfortably? unlimited    Patient  Stated Goals decrease pain, be able to move LUE to PLOF    Pain Onset More than a month ago           Ther-Ex Pulleys flex x15 abd x15 with cuing for scapulohumeral rhythm and cues to keep cervical in neutral    Supine AAROM IR <> ER x12 2-3sec hold with dowel; minimal ER   Supine AAROM abd x12 2-3sec    Supine flex 1# 2x 10 with min cuing for eccentric lower with good carry over   Sidelying abd 1# 2x 10   UE Ranger flex walks x12 with 3sec hold at end range UE ranger clockwise cir x15 counterclockwise x15  RTB pull aparts (scap retractions) 2x 10 with cuing initially for technique with good carry over following  Y on wall 1# 2x 10 with good carry over folloiwng min cuing for technique   Seated tricep/lat stretch stretch x60sec   AROM Flex 160 abd 130 ER C6 IR T12   Manual  PROM all directions G1-2 post CHJ mob in 60d abd G3 post GHJ mobs with movement into flex/abd/ER/IR STM to bicep (severe pain to minimal palpation), UT, and pec minor.  Pt guarded throughout all PROM especially with ER and abd                          PT Education - 09/09/20 0919    Education Details therex form/technique    Person(s) Educated Patient    Methods Explanation;Demonstration;Tactile cues;Verbal cues    Comprehension Verbalized understanding;Returned demonstration;Verbal cues required;Tactile cues required            PT Short Term Goals - 07/25/20 1209      PT SHORT TERM GOAL #1   Title Patient will demonstrate independence with HEP to be able to decrease pain and improve L shoulder function    Baseline 07/25/20 HEP given    Time 4    Period Weeks    Status New    Target Date 08/22/20             PT Slinker Term Goals - 07/25/20 1212      PT Shilling TERM GOAL #1   Title Patient will decrease worst pain as reported on NPRS scale by 3 points to demonstrate a clinically significant reduction in pain.    Baseline 07/25/20 10/10 at worst    Time 8     Period Weeks    Status New    Target Date 09/19/20      PT [place] TERM GOAL #2   Title Patient will increase L shoulder AROM to be [facility] in order to complete ADLs and household activities without pain or difficulty    Baseline 07/25/20 flexion  109, ER base of skull, IR S1, abd 45    Time 8    Period Weeks    Status New    Target Date 09/19/20      PT [place] TERM GOAL #3   Title Patient will increase L shoulder MMT strength to 4/5 to be able to raise arm overhead to complete household chores and return to PLOF    Baseline 07/25/20 flexion, abd, ER, ext 2+ IR 3    Time 8    Period Weeks    Status New    Target Date 09/19/20                 Plan - 09/09/20 7824    Clinical Impression Statement Continued therex progression with manual techniques for increased mobility, strength, and scapulohumeral rhythm. Pt reports no pain increase and is able to comply with therex with proper technique. Good motivation throughout session and toleration to increased joint mobilizations. PT will continue to progress as able.    Personal Factors and Comorbidities Age;Comorbidity 1;Past/Current Experience;Time since onset of injury/illness/exacerbation;Sex    Comorbidities R breast cancer    Examination-Activity Limitations Bathing;Sleep;Lift;Dressing;Reach Overhead;Hygiene/Grooming    Examination-Participation Restrictions Cleaning;Laundry;Yard Work;Community Activity    Stability/Clinical Decision Making Evolving/Moderate complexity    Clinical Decision Making Moderate    Rehab Potential Good    PT Frequency 2x / week    PT Duration 8 weeks    PT Treatment/Interventions ADLs/Self Care Home Management;Electrical Stimulation;Moist Heat;Iontophoresis 4mg /ml Dexamethasone;Traction;Ultrasound;Functional mobility training;Therapeutic activities;Therapeutic exercise;Neuromuscular re-education;Patient/family education;Manual techniques;Passive range of motion;Dry needling;Scar mobilization;Joint  Manipulations;Other (comment)    PT Next Visit Plan HEP review    PT Home Exercise Plan AAROM pulleys, IR towel stretch, pec stretch    Consulted and Agree with Plan of Care Patient           Patient will benefit from skilled therapeutic intervention in order to improve the following deficits and impairments:  Decreased mobility,Hypomobility,Improper body mechanics,Impaired tone,Decreased range of motion,Decreased strength,Increased fascial restricitons,Impaired flexibility,Impaired UE functional use,Postural dysfunction,Pain  Visit Diagnosis: Chronic left shoulder pain     Problem List Patient Active Problem List   Diagnosis Date Noted  . Breast cancer ([place]) 03/21/2020  . Anemia due to antineoplastic chemotherapy 02/15/2020  . Cardiomyopathy ([place]) 12/12/2019  . Dehydration 11/24/2019  . Acute pain of left shoulder 11/24/2019  . Orthostatic hypotension 10/12/2019  . Low serum cortisol level ([place]) 09/29/2019  . Malignant neoplasm of breast in female, estrogen receptor negative ([place]) 09/28/2019  . Hypokalemia 09/15/2019  . Hypocalcemia 09/15/2019  . Normocytic anemia 09/15/2019  . B12 deficiency 09/15/2019  . Iron deficiency anemia 09/15/2019  . Encounter for antineoplastic chemotherapy 08/25/2019  . Goals of care, counseling/discussion 08/17/2019  . Weight loss 07/31/2019  . Invasive carcinoma of breast ([place]) 07/30/2019  . H/O diarrhea 01/01/2017    Durwin Reges DPT Turner Daniels, SPT Durwin Reges 09/09/2020, 11:03 AM  [facility] [address] [place], [address] Phone: [phone]   Fax:  [phone]  Name: Heather Bell MRN: [id number] Date of Birth: 29-Aug-1975

## 2020-09-12 ENCOUNTER — Encounter: Payer: BC Managed Care – PPO | Admitting: Physical Therapy

## 2020-09-12 ENCOUNTER — Ambulatory Visit: Payer: BC Managed Care – PPO | Admitting: Physical Therapy

## 2020-09-12 ENCOUNTER — Encounter: Payer: Self-pay | Admitting: Physical Therapy

## 2020-09-12 ENCOUNTER — Other Ambulatory Visit: Payer: Self-pay

## 2020-09-12 DIAGNOSIS — G8929 Other chronic pain: Secondary | ICD-10-CM

## 2020-09-12 DIAGNOSIS — M25512 Pain in left shoulder: Secondary | ICD-10-CM

## 2020-09-12 NOTE — Therapy (Signed)
[facility] [address], [place], [address] Phone: [phone]   Fax:  [phone]  Physical Therapy Treatment  Patient Details  Name: Heather Bell MRN: [id number] Date of Birth: 1976-04-24 No data recorded  Encounter Date: 09/12/2020   PT End of Session - 09/12/20 1130    Visit Number 13    Number of Visits 17    Date for PT Re-Evaluation 09/23/20    Authorization - Visit Number 13    Authorization - Number of Visits 17    PT Start Time 1118    PT Stop Time 1158    PT Time Calculation (min) 40 min    Activity Tolerance Patient tolerated treatment well;Patient limited by pain    Behavior During Therapy [facility] for tasks assessed/performed           Past Medical History:  Diagnosis Date  . Allergic rhinitis   . Anxiety   . Bipolar disorder ([place])    depression  . Breast cancer ([place]) 03/21/2020   Right mastectomy  . Cancer [facility])    breast right  . Depression   . Diabetes ([place])   . GERD (gastroesophageal reflux disease)   . Hyperlipidemia   . Personal history of chemotherapy    Jan 2021 through July 2021    Past Surgical History:  Procedure Laterality Date  . BREAST BIOPSY Right 07/22/2019   mass bx, path pending, heart marker  . BREAST BIOPSY Right 07/22/2019   LN bx, path pending,  butterfly hydromarker  . BREAST RECONSTRUCTION WITH PLACEMENT OF TISSUE EXPANDER AND FLEX HD (ACELLULAR HYDRATED DERMIS) Right 03/21/2020   Procedure: RIGHT BREAST RECONSTRUCTION WITH PLACEMENT OF TISSUE EXPANDER AND FLEX HD (ACELLULAR HYDRATED DERMIS);  Surgeon: [name], MD;  Location: [facility];  Service: Plastics;  Laterality: Right;  . IMAGE GUIDED SINUS SURGERY    . INCISION AND DRAINAGE OF WOUND Right 05/02/2020   Procedure: IRRIGATION AND DEBRIDEMENT WOUND WITH REMOVAL OF RIGHT TISSUE EXPANDER;  Surgeon: [name], MD;  Location: [place];  Service: Plastics;  Laterality: Right;  1  hour please  . KNEE SURGERY    . MASTECTOMY Right 03/21/2020  . NASAL SEPTUM SURGERY    . PORTACATH PLACEMENT N/A 08/07/2019   Procedure: INSERTION PORT-A-CATH;  Surgeon: Herbert Pun, MD;  Location: [facility];  Service: General;  Laterality: N/A;  . TONSILLECTOMY    . TOTAL MASTECTOMY Right 03/21/2020   Procedure: TOTAL MASTECTOMY;  Surgeon: Herbert Pun, MD;  Location: [facility];  Service: General;  Laterality: Right;    There were no vitals filed for this visit.   Subjective Assessment - 09/12/20 1121    Subjective Reports 4/10 pain in the arm this am and through the weekend. pt took pain medicine as needed which helped; otherwise doing well.    Pertinent History Patient is a 45 year old female with L shoulder pain that began after a L port-a-cath was inserted last December for chemotherapy treatments for R breast cancer. Pt has concluded chemo treatments, but per onocologist is to keep port for a "little while longer". Has had R masectomy and R expander placement and removal (no current plans for reconstruction). Pt reports a steady increase in pain and loss of motion over time since the port was inserted. Pt has received cortisone shots in L shoulder for bursitis last July and stated it helps decrease pain for some time but did not last very [place].  Pt had an expander put in on R side post masectomy last year but the tissue died over it and is thinking about getting another one. Pt has difficulty and pain with all movements and states only rest and no movement makes her L shoulder feel better. Pt scales the pain a 10/10 at worst and 1/10 at best when not moving it. Pt is sleeping on her back but depending on if she rolls over, her L shoulder can be painful in the AM. Patient denies any N/V, B&B, unexplained weight loss, night pain, fever, or night sweats.    Limitations Lifting;House hold activities    How [place] can you sit comfortably? unlimited    How [place] can you stand  comfortably? unlimited    How [place] can you walk comfortably? unlimited    Patient Stated Goals decrease pain, be able to move LUE to PLOF    Pain Onset More than a month ago             Ther-Ex Pulleys flex x15 abd x15 with cuing for scapulohumeral rhythm and cues to keep cervical in neutral    Supine AAROM IR <> ER 2x12 2-3sec hold with dowel; minimal ER   Supine AAROM abd 2x12 2-3sec    Supine flex 1# 2x 10 with min cuing for eccentric lower with good carry over   Sidelying abd 1# 2x 10   Sidelying ER 2x 10    UE Ranger flex walks x12 with 3sec hold at end range UE ranger clockwise cir x15 counterclockwise x15    Y on wall 1# 2x 10 with good carry over folloiwng min cuing for technique   Seated tricep/lat stretch stretch x60sec   AROM Flex 160 abd 130 ER C6 IR T12   Manual  PROM all directions G1-2 post CHJ mob in 60d abd G3 post GHJ mobs with movement into flex/abd/ER/IR STM to bicep (severe pain to minimal palpation), UT, and pec minor.  Pt guarded throughout all PROM especially with ER and abd                         PT Education - 09/12/20 1128    Education Details therex form/technique    Person(s) Educated Patient    Methods Explanation;Demonstration;Tactile cues;Verbal cues    Comprehension Verbalized understanding;Returned demonstration;Verbal cues required;Tactile cues required            PT Short Term Goals - 07/25/20 1209      PT SHORT TERM GOAL #1   Title Patient will demonstrate independence with HEP to be able to decrease pain and improve L shoulder function    Baseline 07/25/20 HEP given    Time 4    Period Weeks    Status New    Target Date 08/22/20             PT [place] Term Goals - 07/25/20 1212      PT [place] TERM GOAL #1   Title Patient will decrease worst pain as reported on NPRS scale by 3 points to demonstrate a clinically significant reduction in pain.    Baseline 07/25/20 10/10 at worst    Time 8     Period Weeks    Status New    Target Date 09/19/20      PT Reader TERM GOAL #2   Title Patient will increase L shoulder AROM to be [facility] in order to complete ADLs and household activities without pain or difficulty  Baseline 07/25/20 flexion 109, ER base of skull, IR S1, abd 45    Time 8    Period Weeks    Status New    Target Date 09/19/20      PT [place] TERM GOAL #3   Title Patient will increase L shoulder MMT strength to 4/5 to be able to raise arm overhead to complete household chores and return to PLOF    Baseline 07/25/20 flexion, abd, ER, ext 2+ IR 3    Time 8    Period Weeks    Status New    Target Date 09/19/20                 Plan - 09/12/20 1133    Clinical Impression Statement Continued therex progression with manual techniques for increased mobility, strength, and scpulohumeral rhythm. Pt is able to perform therex with proper technique. Pt reported some pain with toleration to increased joint mobilization with continued motivation throughout session. PT will continue to progress as able.    Personal Factors and Comorbidities Age;Comorbidity 1;Past/Current Experience;Time since onset of injury/illness/exacerbation;Sex    Comorbidities R breast cancer    Examination-Activity Limitations Bathing;Sleep;Lift;Dressing;Reach Overhead;Hygiene/Grooming    Examination-Participation Restrictions Cleaning;Laundry;Yard Work;Community Activity    Stability/Clinical Decision Making Evolving/Moderate complexity    Clinical Decision Making Moderate    Rehab Potential Good    PT Frequency 2x / week    PT Duration 8 weeks    PT Treatment/Interventions ADLs/Self Care Home Management;Electrical Stimulation;Moist Heat;Iontophoresis 4mg /ml Dexamethasone;Traction;Ultrasound;Functional mobility training;Therapeutic activities;Therapeutic exercise;Neuromuscular re-education;Patient/family education;Manual techniques;Passive range of motion;Dry needling;Scar mobilization;Joint Manipulations;Other  (comment)    PT Next Visit Plan HEP review    PT Home Exercise Plan AAROM pulleys, IR towel stretch, pec stretch    Consulted and Agree with Plan of Care Patient           Patient will benefit from skilled therapeutic intervention in order to improve the following deficits and impairments:  Decreased mobility,Hypomobility,Improper body mechanics,Impaired tone,Decreased range of motion,Decreased strength,Increased fascial restricitons,Impaired flexibility,Impaired UE functional use,Postural dysfunction,Pain  Visit Diagnosis: Chronic left shoulder pain     Problem List Patient Active Problem List   Diagnosis Date Noted  . Breast cancer ([place]) 03/21/2020  . Anemia due to antineoplastic chemotherapy 02/15/2020  . Cardiomyopathy ([place]) 12/12/2019  . Dehydration 11/24/2019  . Acute pain of left shoulder 11/24/2019  . Orthostatic hypotension 10/12/2019  . Low serum cortisol level ([place]) 09/29/2019  . Malignant neoplasm of breast in female, estrogen receptor negative ([place]) 09/28/2019  . Hypokalemia 09/15/2019  . Hypocalcemia 09/15/2019  . Normocytic anemia 09/15/2019  . B12 deficiency 09/15/2019  . Iron deficiency anemia 09/15/2019  . Encounter for antineoplastic chemotherapy 08/25/2019  . Goals of care, counseling/discussion 08/17/2019  . Weight loss 07/31/2019  . Invasive carcinoma of breast ([place]) 07/30/2019  . H/O diarrhea 01/01/2017    Durwin Reges DPT Turner Daniels, SPT Durwin Reges 09/12/2020, 1:19 PM  [facility] [address] [place], [address] Phone: [phone]   Fax:  [phone]  Name: JETTY EPPLE MRN: [id number] Date of Birth: 02/24/1976

## 2020-09-14 ENCOUNTER — Other Ambulatory Visit: Payer: Self-pay

## 2020-09-14 ENCOUNTER — Ambulatory Visit: Payer: BC Managed Care – PPO

## 2020-09-14 DIAGNOSIS — M25512 Pain in left shoulder: Secondary | ICD-10-CM | POA: Diagnosis not present

## 2020-09-14 DIAGNOSIS — M25612 Stiffness of left shoulder, not elsewhere classified: Secondary | ICD-10-CM

## 2020-09-14 DIAGNOSIS — G8929 Other chronic pain: Secondary | ICD-10-CM

## 2020-09-14 NOTE — Therapy (Signed)
[facility] [address] [place], [address] Phone: [phone]   Fax:  [phone]  Physical Therapy Treatment  Patient Details  Name: Heather Bell MRN: [id number] Date of Birth: [birth year] No data recorded  Encounter Date: 09/14/2020   PT End of Session - 09/14/20 0859    Visit Number 14    Number of Visits 17    Date for PT Re-Evaluation 09/23/20    PT Start Time 0849    PT Stop Time 0929    PT Time Calculation (min) 40 min    Activity Tolerance Patient tolerated treatment well;Patient limited by pain    Behavior During Therapy [facility] for tasks assessed/performed           Past Medical History:  Diagnosis Date  . Allergic rhinitis   . Anxiety   . Bipolar disorder ([place])    depression  . Breast cancer ([place]) 03/21/2020   Right mastectomy  . Cancer [facility])    breast right  . Depression   . Diabetes ([place])   . GERD (gastroesophageal reflux disease)   . Hyperlipidemia   . Personal history of chemotherapy    Jan 2021 through July 2021    Past Surgical History:  Procedure Laterality Date  . BREAST BIOPSY Right 07/22/2019   mass bx, path pending, heart marker  . BREAST BIOPSY Right 07/22/2019   LN bx, path pending,  butterfly hydromarker  . BREAST RECONSTRUCTION WITH PLACEMENT OF TISSUE EXPANDER AND FLEX HD (ACELLULAR HYDRATED DERMIS) Right 03/21/2020   Procedure: RIGHT BREAST RECONSTRUCTION WITH PLACEMENT OF TISSUE EXPANDER AND FLEX HD (ACELLULAR HYDRATED DERMIS);  Surgeon: [name], MD;  Location: [facility];  Service: Plastics;  Laterality: Right;  . IMAGE GUIDED SINUS SURGERY    . INCISION AND DRAINAGE OF WOUND Right 05/02/2020   Procedure: IRRIGATION AND DEBRIDEMENT WOUND WITH REMOVAL OF RIGHT TISSUE EXPANDER;  Surgeon: [name], MD;  Location: [place];  Service: Plastics;  Laterality: Right;  1 hour please  . KNEE SURGERY    . MASTECTOMY Right 03/21/2020  . NASAL  SEPTUM SURGERY    . PORTACATH PLACEMENT N/A 08/07/2019   Procedure: INSERTION PORT-A-CATH;  Surgeon: Herbert Pun, MD;  Location: [facility];  Service: General;  Laterality: N/A;  . TONSILLECTOMY    . TOTAL MASTECTOMY Right 03/21/2020   Procedure: TOTAL MASTECTOMY;  Surgeon: Herbert Pun, MD;  Location: [facility];  Service: General;  Laterality: Right;    There were no vitals filed for this visit.   Subjective Assessment - 09/14/20 0855    Subjective Pt doing well today, reports she still does not always remember to do her HEP, but executes when it comes to mind. Pain flareup from last session improved this date.    Pertinent History Patient is a 45 year old female with L shoulder pain that began after a L port-a-cath was inserted last December for chemotherapy treatments for R breast cancer. Pt has concluded chemo treatments, but per onocologist is to keep port for a "little while longer". Has had R masectomy and R expander placement and removal (no current plans for reconstruction). Pt reports a steady increase in pain and loss of motion over time since the port was inserted. Pt has received cortisone shots in L shoulder for bursitis last July and stated it helps decrease pain for some time but did not last very [place]. Pt had an expander put in on R side post  masectomy last year but the tissue died over it and is thinking about getting another one. Pt has difficulty and pain with all movements and states only rest and no movement makes her L shoulder feel better. Pt scales the pain a 10/10 at worst and 1/10 at best when not moving it. Pt is sleeping on her back but depending on if she rolls over, her L shoulder can be painful in the AM. Patient denies any N/V, B&B, unexplained weight loss, night pain, fever, or night sweats.    Limitations Lifting;House hold activities    How [place] can you sit comfortably? unlimited    How [place] can you stand comfortably? unlimited    How [place] can you  walk comfortably? unlimited    Patient Stated Goals decrease pain, be able to move LUE to PLOF    Currently in Pain? Yes    Pain Score 1     Pain Location Shoulder    Pain Orientation Left             INTERVENTION THIS DATE:  -Pulleys flex x15 abd x15 cuing for scapulohumeral rhythm and cues to keep cervical in neutral  -Supine overhead 'wand flexion' c grey physioball 1x20x3secH -Left GHJ distraction with towel, joint in neutral 1x60sec x Grade III -Left GHJ distraction c towel, @ 80 degrees flexion 1x60sec x Grade III -Supine GHJ AA/ROM wand ER, GHJ @ 45 degrees ABDCT, elbow on towel roll; 15x3secH  -Supine GHJ AA/ROM wand ABDCT, GHJ @ 45 degrees ABDCT, elbow on towel roll; 15x3secH -Supine A/ROM GHJ IR (GHJ at 45 degrees) IR to end range 15x3secH   Supine flex 1#FW 2x10  Sidelying abd 1#FW 2x10  Sidelying ER 2x10 1#FW Supine Chest Press (45* ADBCT) 2x10 c 1# FW (2lb, 3lb too heavy)  Standing LUE Row 2 RedTB 2x10 (arm at side)  Y on wall 1# 2x10    PT Short Term Goals - 07/25/20 1209      PT SHORT TERM GOAL #1   Title Patient will demonstrate independence with HEP to be able to decrease pain and improve L shoulder function    Baseline 07/25/20 HEP given    Time 4    Period Weeks    Status New    Target Date 08/22/20             PT [place] Term Goals - 07/25/20 1212      PT [place] TERM GOAL #1   Title Patient will decrease worst pain as reported on NPRS scale by 3 points to demonstrate a clinically significant reduction in pain.    Baseline 07/25/20 10/10 at worst    Time 8    Period Weeks    Status New    Target Date 09/19/20      PT [place] TERM GOAL #2   Title Patient will increase L shoulder AROM to be [facility] in order to complete ADLs and household activities without pain or difficulty    Baseline 07/25/20 flexion 109, ER base of skull, IR S1, abd 45    Time 8    Period Weeks    Status New    Target Date 09/19/20      PT [place] TERM GOAL #3   Title Patient will  increase L shoulder MMT strength to 4/5 to be able to raise arm overhead to complete household chores and return to PLOF    Baseline 07/25/20 flexion, abd, ER, ext 2+ IR 3    Time 8  Period Weeks    Status New    Target Date 09/19/20                 Plan - 09/14/20 0900    Clinical Impression Statement Continued with current plan of care as laid out in evaluation and recent prior sessions. Pt remains motivated to advance progress toward goals. Rest breaks provided as needed, pt quick to ask when needed. Author maintains all interventions within appropriate level of intensity as not to purposefully exacerbate pain. Added in supine chest press and standing row to engage multi plane, multi segment synergistic functional movements. Pt does require varying levels of assistance and cuing for completion of exercises for correct form and sometimes due to pain/weakness. Pt continues to demonstrate progress toward goals AEB progression of some interventions this date either in volume or intensity. No updates to HEP this date.    Personal Factors and Comorbidities Comorbidity 2    Comorbidities R breast cancer    Examination-Activity Limitations Bathing;Sleep;Lift;Dressing;Reach Overhead;Hygiene/Grooming    Examination-Participation Restrictions Cleaning;Laundry;Yard Work;Community Activity    Stability/Clinical Decision Making Evolving/Moderate complexity    Clinical Decision Making Moderate    Rehab Potential Good    PT Frequency 2x / week    PT Duration 8 weeks    PT Treatment/Interventions ADLs/Self Care Home Management;Electrical Stimulation;Moist Heat;Iontophoresis 4mg /ml Dexamethasone;Traction;Ultrasound;Functional mobility training;Therapeutic activities;Therapeutic exercise;Neuromuscular re-education;Patient/family education;Manual techniques;Passive range of motion;Dry needling;Scar mobilization;Joint Manipulations;Other (comment)    PT Next Visit Plan continue with mobility and single  plane strength    PT Home Exercise Plan AAROM pulleys, IR towel stretch, pec stretch    Consulted and Agree with Plan of Care Patient           Patient will benefit from skilled therapeutic intervention in order to improve the following deficits and impairments:  Decreased mobility,Hypomobility,Improper body mechanics,Impaired tone,Decreased range of motion,Decreased strength,Increased fascial restricitons,Impaired flexibility,Impaired UE functional use,Postural dysfunction,Pain  Visit Diagnosis: Chronic left shoulder pain  Stiffness of left shoulder, not elsewhere classified     Problem List Patient Active Problem List   Diagnosis Date Noted  . Breast cancer ([place]) 03/21/2020  . Anemia due to antineoplastic chemotherapy 02/15/2020  . Cardiomyopathy ([place]) 12/12/2019  . Dehydration 11/24/2019  . Acute pain of left shoulder 11/24/2019  . Orthostatic hypotension 10/12/2019  . Low serum cortisol level ([place]) 09/29/2019  . Malignant neoplasm of breast in female, estrogen receptor negative ([place]) 09/28/2019  . Hypokalemia 09/15/2019  . Hypocalcemia 09/15/2019  . Normocytic anemia 09/15/2019  . B12 deficiency 09/15/2019  . Iron deficiency anemia 09/15/2019  . Encounter for antineoplastic chemotherapy 08/25/2019  . Goals of care, counseling/discussion 08/17/2019  . Weight loss 07/31/2019  . Invasive carcinoma of breast ([place]) 07/30/2019  . H/O diarrhea 01/01/2017   9:33 AM, 09/14/20 Etta Grandchild, PT, DPT Physical Therapist - [place] [phone] (Office)   Elianna Windom C 09/14/2020, 9:23 AM  [facility] [address], [place], [address] Phone: [phone]   Fax:  [phone]  Name: NECIA KAMM MRN: [id number] Date of Birth: 08/01/1976

## 2020-09-16 ENCOUNTER — Other Ambulatory Visit: Payer: Self-pay

## 2020-09-16 DIAGNOSIS — Z171 Estrogen receptor negative status [ER-]: Secondary | ICD-10-CM

## 2020-09-16 DIAGNOSIS — C50511 Malignant neoplasm of lower-outer quadrant of right female breast: Secondary | ICD-10-CM

## 2020-09-19 NOTE — Progress Notes (Signed)
[facility]  [address], [address] [place], [place] [address] Phone: [phone]  Fax: [phone]   Clinic Day:  09/20/20  Referring physician: [place], [place]  Chief Complaint: Heather Bell is a 45 y.o. 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  HPI: The patient was last seen in the medical oncology clinic on 06/28/2020. At that time, Heather Bell was doing well. Hematocrit was 34.3, hemoglobin 11.2, MCV 88.2, platelets 270,000, WBC 5,000.  Ferritin was 28 with an iron saturation of 17% and a TIBC of 322. Vitamin B12 was 1,307 and folate 66.0. CA27.29 was 7.6.    Left screening mammogram on 08/09/2020 revealed no evidence of malignancy.  During the interim, Heather Bell has been "ok." Heather Bell has had left shoulder pain since her port was placed. Her appetite is okay and Heather Bell is okay at drinking fluids. Heather Bell denies nausea, vomiting, diarrhea, shortness of breath, chest pain, palpitations, and bone pain. Her diabetes is well controlled; her Metformin dose was recently decreased.  Heather Bell does not perform monthly breast self exams.  Heather Bell has been having trouble with her memory lately. It has never been great, but has been worse recently.   Past Medical History:  Diagnosis Date  . Allergic rhinitis   . Anxiety   . Bipolar disorder ([place])    depression  . Breast cancer ([place]) 03/21/2020   Right mastectomy  . Cancer [facility])    breast right  . Depression   . Diabetes ([place])   . GERD (gastroesophageal reflux disease)   . Hyperlipidemia   . Personal history of chemotherapy    Jan 2021 through July 2021    Past Surgical History:  Procedure Laterality Date  . BREAST BIOPSY Right 07/22/2019   mass bx, path pending, heart marker  . BREAST BIOPSY Right 07/22/2019   LN bx, path pending,  butterfly hydromarker  . BREAST RECONSTRUCTION WITH PLACEMENT OF TISSUE EXPANDER AND  FLEX HD (ACELLULAR HYDRATED DERMIS) Right 03/21/2020   Procedure: RIGHT BREAST RECONSTRUCTION WITH PLACEMENT OF TISSUE EXPANDER AND FLEX HD (ACELLULAR HYDRATED DERMIS);  Surgeon: [name], MD;  Location: [facility];  Service: Plastics;  Laterality: Right;  . IMAGE GUIDED SINUS SURGERY    . INCISION AND DRAINAGE OF WOUND Right 05/02/2020   Procedure: IRRIGATION AND DEBRIDEMENT WOUND WITH REMOVAL OF RIGHT TISSUE EXPANDER;  Surgeon: [name], MD;  Location: [place];  Service: Plastics;  Laterality: Right;  1 hour please  . KNEE SURGERY    . MASTECTOMY Right 03/21/2020  . NASAL SEPTUM SURGERY    . PORTACATH PLACEMENT N/A 08/07/2019   Procedure: INSERTION PORT-A-CATH;  Surgeon: Herbert Pun, MD;  Location: [facility];  Service: General;  Laterality: N/A;  . TONSILLECTOMY    . TOTAL MASTECTOMY Right 03/21/2020   Procedure: TOTAL MASTECTOMY;  Surgeon: Herbert Pun, MD;  Location: [facility];  Service: General;  Laterality: Right;    Family History  Problem Relation Age of Onset  . Lung cancer Father   . Heart attack Father   . Lung cancer Paternal Uncle   . Heart disease Paternal Uncle   . Lung cancer Paternal Aunt   . Breast cancer Neg Hx     Social History:  reports that Heather Bell has never smoked. Heather Bell has never used smokeless tobacco. Heather Bell reports that Heather Bell does not drink alcohol and does not use drugs. 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 Heather Bell is currently not working. Her husbandisMichael.Michaelused totravel a lot for his job.Her husband found a new job closer to home in [place], [place]. The patient is alone today.   Allergies:  Allergies  Allergen Reactions  . Compazine [Prochlorperazine Edisylate] Other (See Comments)    Acute dystonic reaction  . Morphine And Related Shortness Of Breath  . Doxycycline Other (See Comments)    "made all my  symptoms worse"   . Fluticasone Other (See Comments)    Doesn't work  . Levofloxacin Other (See Comments)    "made all my symptoms worse"     Current Medications: Current Outpatient Medications  Medication Sig Dispense Refill  . amphetamine-dextroamphetamine (ADDERALL XR) 25 MG 24 hr capsule Take 25 mg by mouth daily.    . Calcium Carb-Cholecalciferol (CALCIUM 600+D) 600-800 MG-UNIT TABS Take 1 tablet by mouth daily.    . cetirizine (ZYRTEC) 10 MG tablet Take 10 mg by mouth daily.     Marland Kitchen doxepin (SINEQUAN) 25 MG capsule Take 25-50 mg by mouth at bedtime.     Marland Kitchen HYDROcodone-acetaminophen (NORCO/VICODIN) 5-325 MG tablet as needed.    Marland Kitchen ibuprofen (ADVIL) 200 MG tablet Take 400-800 mg by mouth every 6 (six) hours as needed for headache or moderate pain.    Marland Kitchen lamoTRIgine (LAMICTAL) 200 MG tablet Take 150 mg by mouth at bedtime.    . lidocaine-prilocaine (EMLA) cream Apply topically over port-a-cath 30 minutes to 1 hour prior to treatment. 30 g 1  . LORazepam (ATIVAN) 0.5 MG tablet Take 0.5 mg by mouth every 8 (eight) hours as needed (nausea).    . lurasidone (LATUDA) 40 MG TABS tablet Take 40 mg by mouth at bedtime.     . Lurasidone HCl 60 MG TABS Take 60 mg by mouth at bedtime.     . metFORMIN (GLUCOPHAGE-XR) 500 MG 24 hr tablet Take 500 mg by mouth 2 (two) times daily.    . mometasone (NASONEX) 50 MCG/ACT nasal spray Place 2 sprays into the nose daily as needed (allergies).     . Multiple Vitamin (MULTIVITAMIN WITH MINERALS) TABS tablet Take 1 tablet by mouth daily.    Marland Kitchen omeprazole (PRILOSEC) 20 MG capsule Take 20 mg by mouth daily before breakfast.     . oxyCODONE-acetaminophen (PERCOCET) 7.5-325 MG tablet Take by mouth as needed.    Marland Kitchen QUEtiapine (SEROQUEL) 300 MG tablet Take 300 mg by mouth at bedtime.    . simvastatin (ZOCOR) 20 MG tablet Take 20 mg by mouth daily.    Marland Kitchen topiramate (TOPAMAX) 100 MG tablet Take 50-100 mg by mouth See admin instructions. Take 1 tablet (100 mg) by mouth in  the morning & take 0.5 tablet (50 mg) by mouth at night.    . vitamin B-12 (CYANOCOBALAMIN) 1000 MCG tablet Take 1,000 mcg by mouth daily.    . diazepam (VALIUM) 10 MG tablet Take 10 mg by mouth 2 (two) times daily. (Patient not taking: Reported on 09/20/2020)     No current facility-administered medications for this visit.   Facility-Administered Medications Ordered in Other Visits  Medication Dose Route Frequency Provider Last Rate Last Admin  . 0.9 %  sodium chloride infusion   Intravenous Continuous Lequita Asal, MD   Stopped at 01/08/20 1516  . sodium chloride flush (NS) 0.9 % injection 10 mL  10 mL Intravenous PRN Jeramine Delis, Drue Second, MD   10 mL  at 01/08/20 1314  . sodium chloride flush (NS) 0.9 % injection 10 mL  10 mL Intravenous PRN Nolon Stalls C, MD   10 mL at 09/20/20 0955   Review of Systems  Constitutional: Negative for chills, diaphoresis, fever, malaise/fatigue and weight loss (up 3 lbs).       Feels "ok."  HENT: Negative.  Negative for congestion, ear discharge, ear pain, hearing loss, nosebleeds, sinus pain, sore throat and tinnitus.   Eyes: Negative.  Negative for blurred vision, double vision and photophobia.  Respiratory: Negative.  Negative for cough, hemoptysis, sputum production and shortness of breath.   Cardiovascular: Negative.  Negative for chest pain, palpitations, orthopnea and leg swelling.  Gastrointestinal: Negative for abdominal pain, blood in stool, constipation, diarrhea, heartburn, melena, nausea and vomiting.       Appetite is "ok".  Genitourinary: Negative.  Negative for dysuria, frequency, hematuria and urgency.  Musculoskeletal: Positive for joint pain (Left shoulder). Negative for back pain, myalgias and neck pain.  Skin: Negative.  Negative for itching and rash.  Neurological: Negative.  Negative for dizziness, tingling, sensory change, speech change, focal weakness, weakness and headaches.  Endo/Heme/Allergies: Does not bruise/bleed  easily.       Type II diabetes, well controlled  Psychiatric/Behavioral: Positive for memory loss. Negative for depression. The patient is not nervous/anxious and does not have insomnia.   All other systems reviewed and are negative.  Performance status (ECOG):  0  Vitals Blood pressure 102/68, pulse (!) 120, temperature 97.8 F (36.6 C), temperature source Tympanic, resp. rate 16, weight 144 lb 11.7 oz (65.6 kg), SpO2 100 %.   Physical Exam Vitals and nursing note reviewed.  Constitutional:      General: Heather Bell is not in acute distress.    Appearance: Normal appearance. Heather Bell is well-developed. Heather Bell is not ill-appearing or diaphoretic.  HENT:     Head: Normocephalic and atraumatic.     Comments: Scarf.    Mouth/Throat:     Mouth: Mucous membranes are moist.     Pharynx: Oropharynx is clear.  Eyes:     General: No scleral icterus.    Extraocular Movements: Extraocular movements intact.     Conjunctiva/sclera: Conjunctivae normal.     Pupils: Pupils are equal, round, and reactive to light.     Comments: Brown eyes.  Cardiovascular:     Rate and Rhythm: Normal rate and regular rhythm.     Heart sounds: Normal heart sounds. No murmur heard.   Pulmonary:     Effort: Pulmonary effort is normal. No respiratory distress.     Breath sounds: Normal breath sounds. No wheezing, rhonchi or rales.  Chest:     Chest wall: No tenderness.  Breasts:     Right: Absent. No axillary adenopathy or supraclavicular adenopathy.     Left: Skin change (fibrocystic changes) present. No swelling, bleeding, mass, tenderness, axillary adenopathy or supraclavicular adenopathy.      Comments: No right chest wall erythema or nodularity. Abdominal:     General: Bowel sounds are normal. There is no distension.     Palpations: Abdomen is soft. There is no hepatomegaly, splenomegaly or mass.     Tenderness: There is no guarding or rebound.  Musculoskeletal:        General: No swelling or tenderness. Normal  range of motion.     Cervical back: Normal range of motion and neck supple.  Lymphadenopathy:     Head:     Right side of head: No preauricular, posterior auricular  or occipital adenopathy.     Left side of head: No preauricular, posterior auricular or occipital adenopathy.     Cervical: No cervical adenopathy.     Upper Body:     Right upper body: No supraclavicular or axillary adenopathy.     Left upper body: No supraclavicular or axillary adenopathy.     Lower Body: No right inguinal adenopathy. No left inguinal adenopathy.  Skin:    General: Skin is warm and dry.  Neurological:     Mental Status: Heather Bell is alert and oriented to person, place, and time. Mental status is at baseline.  Psychiatric:        Mood and Affect: Mood normal.        Behavior: Behavior normal.        Thought Content: Thought content normal.        Judgment: Judgment normal.    Infusion on 09/20/2020  Component Date Value Ref Range Status  . Sodium 09/20/2020 139  135 - 145 mmol/L Final  . Potassium 09/20/2020 3.8  3.5 - 5.1 mmol/L Final  . Chloride 09/20/2020 106  98 - 111 mmol/L Final  . CO2 09/20/2020 24  22 - 32 mmol/L Final  . Glucose, Bld 09/20/2020 114* 70 - 99 mg/dL Final   Glucose reference range applies only to samples taken after fasting for at least 8 hours.  . BUN 09/20/2020 16  6 - 20 mg/dL Final  . Creatinine, Ser 09/20/2020 0.76  0.44 - 1.00 mg/dL Final  . Calcium 09/20/2020 9.1  8.9 - 10.3 mg/dL Final  . Total Protein 09/20/2020 7.4  6.5 - 8.1 g/dL Final  . Albumin 09/20/2020 4.2  3.5 - 5.0 g/dL Final  . AST 09/20/2020 15  15 - 41 U/L Final  . ALT 09/20/2020 11  0 - 44 U/L Final  . Alkaline Phosphatase 09/20/2020 62  38 - 126 U/L Final  . Total Bilirubin 09/20/2020 0.5  0.3 - 1.2 mg/dL Final  . GFR, Estimated 09/20/2020 >60  >60 mL/min Final   Comment: (NOTE) Calculated using the CKD-EPI Creatinine Equation (2021)   . Anion gap 09/20/2020 9  5 - 15 Final   Performed at [facility], [address] [place], [place] [address]  . WBC 09/20/2020 6.3  4.0 - 10.5 K/uL Final  . RBC 09/20/2020 3.91  3.87 - 5.11 MIL/uL Final  . Hemoglobin 09/20/2020 11.3* 12.0 - 15.0 g/dL Final  . HCT 09/20/2020 34.3* 36.0 - 46.0 % Final  . MCV 09/20/2020 87.7  80.0 - 100.0 fL Final  . MCH 09/20/2020 28.9  26.0 - 34.0 pg Final  . MCHC 09/20/2020 32.9  30.0 - 36.0 g/dL Final  . RDW 09/20/2020 12.9  11.5 - 15.5 % Final  . Platelets 09/20/2020 291  150 - 400 K/uL Final  . nRBC 09/20/2020 0.0  0.0 - 0.2 % Final  . Neutrophils Relative % 09/20/2020 61  % Final  . Neutro Abs 09/20/2020 3.9  1.7 - 7.7 K/uL Final  . Lymphocytes Relative 09/20/2020 29  % Final  . Lymphs Abs 09/20/2020 1.8  0.7 - 4.0 K/uL Final  . Monocytes Relative 09/20/2020 7  % Final  . Monocytes Absolute 09/20/2020 0.4  0.1 - 1.0 K/uL Final  . Eosinophils Relative 09/20/2020 2  % Final  . Eosinophils Absolute 09/20/2020 0.2  0.0 - 0.5 K/uL Final  . Basophils Relative 09/20/2020 0  % Final  . Basophils Absolute 09/20/2020 0.0  0.0 - 0.1 K/uL  Final  . Immature Granulocytes 09/20/2020 1  % Final  . Abs Immature Granulocytes 09/20/2020 0.03  0.00 - 0.07 K/uL Final   Performed at [facility], [address], [place], [place] [address]    Assessment:  Heather Bell is a 45 y.o. female with clinical T1cN1triple negativerightbreast cancers/p neoadjuvant chemotherapy followed by right total mastectomy, sentinel lymph node biopsy, and reconstruction on 03/21/2020.  Breast pathology was negative for carcinoma. There was duct ectasia and stromal fibrosis.  Sentinel lymph node and an additional lymph node were negative for carcinoma. Post treatment classification was [id number].  Heather Bell initially underwent ultrasound guided core biopsy on 07/22/2019. Pathology revealeda grade IIIinvasive mammary carcinoma, no special type of the right breast. Specimen was 10 mm.Ductal carcinoma in situ(DCIS)was not  identified.Right axillarylymph nodewas positive for metastatic carcinoma.Tumor was ER - (<1%), PR - (<1%), and Her2/neu -.  Bilateral mammogram and right unilateral ultrasoundon 07/10/2019 revealed a 1.7 x 1.6 x 1.6 cmmass at the7 o'clock region of the right breast 7 cm from the nipple. There was an abnormal1.4 x 0.7 x 0.9 cmaxillary lymph node. Therewas a second3 mmaxillary lymph node with minimal focal cortical thickening.An ultrasound-guided core biopsies of the mass and the enlarged right axillary lymph node was recommended. BI-RADS category 5.  PET scanon 08/11/2019 showed a hypermetabolic mass in the inferior RIGHT breast consistentwith herbreast carcinoma. There was noevidence hypermetabolic nodal metastasis.There was no distant metastatic disease.There was nopulmonary metastasis or skeletal metastasis.  Echoon 12/24/2020revealedan ejection fraction of50 to 55%. The left ventricle demonstrated global hypokinesis.MUGAon 08/24/2019 revealed an EF of 60%.MUGAon 12/08/2019 revealed an EF of57.4%.  MUGA on 01/22/2020 revealed an EF of 56.5%.  Heather Bell is followed by Dr End.  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  Shereceived 12 weeksneoadjuvant Taxol + carboplatin(08/25/2019 -11/24/2019) and 4 cycles of AC (12/15/2019 - 02/16/2020) with Fulphila (pegfilgrastim-jmdb) support.  Heather Bell has required IVF support.  CA27.29 has been followed: 8.5 on 07/30/2019, 8.7 on 04/05/2020, 7 .6 on 06/28/2020, and 14.5 on 09/20/2020.    Heather Bell underwent irrigation and debridement of wound with removal of right tissue expander on 05/02/2020. Left screening mammogram on 08/09/2020 revealed no evidence of malignancy.  Heather Bell is premenopausal. Heather Bell stopped taking birth control pills on 08/17/2019.  Heather Bell has iron deficiency. Ferritin was 21 with an iron saturation of 7% and a  TIBC 343 on 09/08/2019.Heather Bell received Venoferweekly x 3 (09/18/2019 - 10/06/2019).  Heather Bell has B12 deficiency. B12 was 240 on 09/08/2019, 680 on 11/24/2019, and 1307 on 06/28/2020.Heather Bell is on oral B12.  Folate was 66.0 on 06/28/2020.  Heather Bell has recurrent orthostatic hypotension. Fluid intake is marginal. AM cortisol was 5.3 (low) on 09/22/2019 suggestive of adrenal insufficiency.ACTH stim testwas negative.  Cortisol was 9.0 on 12/24/2019.   Symptomatically, Heather Bell feels "ok." Heather Bell has had left shoulder pain since her port was placed.  Exam reveals no evidence of recurrent disease.  Plan: 1.    Labs today: CBC with diff, CMP, CA27.29 2. Clinical stageIIB triple negativeright breast cancer  Heather Bell received neoadjuvant chemotherapy followed by right total mastectomy and sentinel lymph node biopsy.   Pathology revealed a complete pathologic remission.  Heather Bell is s/p removal of right tissue expander.  Clinically, Heather Bell continues to do well.   Exam reveals no evidence of recurrent disease.   Encourage monthly breast self-exam.  Left mammogram on 08/09/2020 revealed no evidence of malignancy.  Discuss port removal.  Continue to  monitor 3. Normocytic anemia Hematocrit 34.3, hemoglobin11.3, MCV 87.7 today.    Ferritin 28 with an iron saturation of 17% and a TIBC of 322 on 06/28/2020. Continue to monitor. 4. B12 deficiency Patient is on oral B12.  B12 was 240 on 01/19/2021and 1307 on 06/28/2020.  Folate was 66 on 06/28/2020.  Monitor annually. 5.RN:  FAX forms to Dr Windell Moment for port-a-cath removal. 6.   RTC in 3 months for MD assessment and labs (CBC with diff, CMP, CA27.29).  I discussed the assessment and treatment plan with the patient.  The patient was provided an opportunity to ask questions and all were answered.  The patient agreed with the plan and demonstrated an understanding of the instructions.  The patient was advised to call back if  the symptoms worsen or if the condition fails to improve as anticipated.  Lequita Asal, MD, PhD 09/20/2020, 5:00 PM  I, De Burrs, am acting as Education administrator for [employer]. Mike Gip, MD, PhD.  I, Draedyn Weidinger C. Mike Gip, MD, have reviewed the above documentation for accuracy and completeness, and I agree with the above.

## 2020-09-20 ENCOUNTER — Inpatient Hospital Stay: Payer: BC Managed Care – PPO | Attending: Hematology and Oncology | Admitting: Hematology and Oncology

## 2020-09-20 ENCOUNTER — Other Ambulatory Visit: Payer: Self-pay

## 2020-09-20 ENCOUNTER — Encounter: Payer: Self-pay | Admitting: Hematology and Oncology

## 2020-09-20 ENCOUNTER — Inpatient Hospital Stay: Payer: BC Managed Care – PPO

## 2020-09-20 VITALS — BP 102/68 | HR 120 | Temp 97.8°F | Resp 16 | Wt 144.7 lb

## 2020-09-20 DIAGNOSIS — K219 Gastro-esophageal reflux disease without esophagitis: Secondary | ICD-10-CM | POA: Insufficient documentation

## 2020-09-20 DIAGNOSIS — Z791 Long term (current) use of non-steroidal anti-inflammatories (NSAID): Secondary | ICD-10-CM | POA: Diagnosis not present

## 2020-09-20 DIAGNOSIS — E119 Type 2 diabetes mellitus without complications: Secondary | ICD-10-CM | POA: Diagnosis not present

## 2020-09-20 DIAGNOSIS — Z801 Family history of malignant neoplasm of trachea, bronchus and lung: Secondary | ICD-10-CM | POA: Insufficient documentation

## 2020-09-20 DIAGNOSIS — Z853 Personal history of malignant neoplasm of breast: Secondary | ICD-10-CM | POA: Insufficient documentation

## 2020-09-20 DIAGNOSIS — F418 Other specified anxiety disorders: Secondary | ICD-10-CM | POA: Diagnosis not present

## 2020-09-20 DIAGNOSIS — Z9011 Acquired absence of right breast and nipple: Secondary | ICD-10-CM | POA: Diagnosis not present

## 2020-09-20 DIAGNOSIS — Z9221 Personal history of antineoplastic chemotherapy: Secondary | ICD-10-CM | POA: Diagnosis not present

## 2020-09-20 DIAGNOSIS — Z95828 Presence of other vascular implants and grafts: Secondary | ICD-10-CM

## 2020-09-20 DIAGNOSIS — F319 Bipolar disorder, unspecified: Secondary | ICD-10-CM | POA: Insufficient documentation

## 2020-09-20 DIAGNOSIS — Z79899 Other long term (current) drug therapy: Secondary | ICD-10-CM | POA: Diagnosis not present

## 2020-09-20 DIAGNOSIS — Z171 Estrogen receptor negative status [ER-]: Secondary | ICD-10-CM

## 2020-09-20 DIAGNOSIS — E538 Deficiency of other specified B group vitamins: Secondary | ICD-10-CM

## 2020-09-20 DIAGNOSIS — D649 Anemia, unspecified: Secondary | ICD-10-CM | POA: Diagnosis not present

## 2020-09-20 DIAGNOSIS — C50511 Malignant neoplasm of lower-outer quadrant of right female breast: Secondary | ICD-10-CM

## 2020-09-20 DIAGNOSIS — D509 Iron deficiency anemia, unspecified: Secondary | ICD-10-CM | POA: Diagnosis not present

## 2020-09-20 DIAGNOSIS — E785 Hyperlipidemia, unspecified: Secondary | ICD-10-CM | POA: Insufficient documentation

## 2020-09-20 LAB — COMPREHENSIVE METABOLIC PANEL
ALT: 11 U/L (ref 0–44)
AST: 15 U/L (ref 15–41)
Albumin: 4.2 g/dL (ref 3.5–5.0)
Alkaline Phosphatase: 62 U/L (ref 38–126)
Anion gap: 9 (ref 5–15)
BUN: 16 mg/dL (ref 6–20)
CO2: 24 mmol/L (ref 22–32)
Calcium: 9.1 mg/dL (ref 8.9–10.3)
Chloride: 106 mmol/L (ref 98–111)
Creatinine, Ser: 0.76 mg/dL (ref 0.44–1.00)
GFR, Estimated: >60 mL/min (ref >60)
Glucose, Bld: 114 mg/dL — ABNORMAL HIGH (ref 70–99)
Potassium: 3.8 mmol/L (ref 3.5–5.1)
Sodium: 139 mmol/L (ref 135–145)
Total Bilirubin: 0.5 mg/dL (ref 0.3–1.2)
Total Protein: 7.4 g/dL (ref 6.5–8.1)

## 2020-09-20 LAB — CBC WITH DIFFERENTIAL/PLATELET
Abs Immature Granulocytes: 0.03 10*3/uL (ref 0.00–0.07)
Basophils Absolute: 0 10*3/uL (ref 0.0–0.1)
Basophils Relative: 0 %
Eosinophils Absolute: 0.2 10*3/uL (ref 0.0–0.5)
Eosinophils Relative: 2 %
HCT: 34.3 % — ABNORMAL LOW (ref 36.0–46.0)
Hemoglobin: 11.3 g/dL — ABNORMAL LOW (ref 12.0–15.0)
Immature Granulocytes: 1 %
Lymphocytes Relative: 29 %
Lymphs Abs: 1.8 10*3/uL (ref 0.7–4.0)
MCH: 28.9 pg (ref 26.0–34.0)
MCHC: 32.9 g/dL (ref 30.0–36.0)
MCV: 87.7 fL (ref 80.0–100.0)
Monocytes Absolute: 0.4 10*3/uL (ref 0.1–1.0)
Monocytes Relative: 7 %
Neutro Abs: 3.9 10*3/uL (ref 1.7–7.7)
Neutrophils Relative %: 61 %
Platelets: 291 10*3/uL (ref 150–400)
RBC: 3.91 MIL/uL (ref 3.87–5.11)
RDW: 12.9 % (ref 11.5–15.5)
WBC: 6.3 10*3/uL (ref 4.0–10.5)
nRBC: 0 % (ref 0.0–0.2)

## 2020-09-20 MED ORDER — HEPARIN SOD (PORK) LOCK FLUSH 100 UNIT/ML IV SOLN
500.0000 [IU] | Freq: Once | INTRAVENOUS | Status: AC
  Administered 2020-09-20: 500 [IU] via INTRAVENOUS
  Filled 2020-09-20: qty 5

## 2020-09-20 MED ORDER — SODIUM CHLORIDE 0.9% FLUSH
10.0000 mL | INTRAVENOUS | Status: DC | PRN
  Administered 2020-09-20: 10 mL via INTRAVENOUS
  Filled 2020-09-20: qty 10

## 2020-09-21 ENCOUNTER — Encounter: Payer: Self-pay | Admitting: Physical Therapy

## 2020-09-21 ENCOUNTER — Ambulatory Visit: Payer: BC Managed Care – PPO | Attending: Sports Medicine | Admitting: Physical Therapy

## 2020-09-21 DIAGNOSIS — M25612 Stiffness of left shoulder, not elsewhere classified: Secondary | ICD-10-CM | POA: Diagnosis present

## 2020-09-21 DIAGNOSIS — M25512 Pain in left shoulder: Secondary | ICD-10-CM | POA: Insufficient documentation

## 2020-09-21 DIAGNOSIS — G8929 Other chronic pain: Secondary | ICD-10-CM | POA: Diagnosis present

## 2020-09-21 LAB — CANCER ANTIGEN 27.29: CA 27.29: 14.5 U/mL (ref 0.0–38.6)

## 2020-09-21 NOTE — Therapy (Signed)
[place] [address] [place], [address] Phone: [phone]   Fax:  [phone]  Physical Therapy Treatment  Patient Details  Name: Heather Bell MRN: [id number] Date of Birth: 12/10/1975 No data recorded  Encounter Date: 09/21/2020   PT End of Session - 09/21/20 0947    Visit Number 15    Number of Visits 17    Date for PT Re-Evaluation 09/23/20    Authorization - Visit Number 14    Authorization - Number of Visits 17    PT Start Time 0945    PT Stop Time 1025    PT Time Calculation (min) 40 min    Activity Tolerance Patient tolerated treatment well;Patient limited by pain    Behavior During Therapy [facility] for tasks assessed/performed           Past Medical History:  Diagnosis Date  . Allergic rhinitis   . Anxiety   . Bipolar disorder ([place])    depression  . Breast cancer ([place]) 03/21/2020   Right mastectomy  . Cancer [facility])    breast right  . Depression   . Diabetes ([place])   . GERD (gastroesophageal reflux disease)   . Hyperlipidemia   . Personal history of chemotherapy    Jan 2021 through July 2021    Past Surgical History:  Procedure Laterality Date  . BREAST BIOPSY Right 07/22/2019   mass bx, path pending, heart marker  . BREAST BIOPSY Right 07/22/2019   LN bx, path pending,  butterfly hydromarker  . BREAST RECONSTRUCTION WITH PLACEMENT OF TISSUE EXPANDER AND FLEX HD (ACELLULAR HYDRATED DERMIS) Right 03/21/2020   Procedure: RIGHT BREAST RECONSTRUCTION WITH PLACEMENT OF TISSUE EXPANDER AND FLEX HD (ACELLULAR HYDRATED DERMIS);  Surgeon: [name], MD;  Location: [facility];  Service: Plastics;  Laterality: Right;  . IMAGE GUIDED SINUS SURGERY    . INCISION AND DRAINAGE OF WOUND Right 05/02/2020   Procedure: IRRIGATION AND DEBRIDEMENT WOUND WITH REMOVAL OF RIGHT TISSUE EXPANDER;  Surgeon: [name], MD;  Location: [place];  Service: Plastics;  Laterality: Right;  1  hour please  . KNEE SURGERY    . MASTECTOMY Right 03/21/2020  . NASAL SEPTUM SURGERY    . PORTACATH PLACEMENT N/A 08/07/2019   Procedure: INSERTION PORT-A-CATH;  Surgeon: Herbert Pun, MD;  Location: [facility];  Service: General;  Laterality: N/A;  . TONSILLECTOMY    . TOTAL MASTECTOMY Right 03/21/2020   Procedure: TOTAL MASTECTOMY;  Surgeon: Herbert Pun, MD;  Location: [facility];  Service: General;  Laterality: Right;    There were no vitals filed for this visit.   Subjective Assessment - 09/21/20 0943    Subjective Pt still having some shoulder pain but otherwise doing well. Reports compliance with HEP when able.    Pertinent History Patient is a 45 year old female with L shoulder pain that began after a L port-a-cath was inserted last December for chemotherapy treatments for R breast cancer. Pt has concluded chemo treatments, but per onocologist is to keep port for a "little while longer". Has had R masectomy and R expander placement and removal (no current plans for reconstruction). Pt reports a steady increase in pain and loss of motion over time since the port was inserted. Pt has received cortisone shots in L shoulder for bursitis last July and stated it helps decrease pain for some time but did not last very [place]. Pt had an expander put in on  R side post masectomy last year but the tissue died over it and is thinking about getting another one. Pt has difficulty and pain with all movements and states only rest and no movement makes her L shoulder feel better. Pt scales the pain a 10/10 at worst and 1/10 at best when not moving it. Pt is sleeping on her back but depending on if she rolls over, her L shoulder can be painful in the AM. Patient denies any N/V, B&B, unexplained weight loss, night pain, fever, or night sweats.    Limitations Lifting;House hold activities    How [place] can you sit comfortably? unlimited    How [place] can you stand comfortably? unlimited    How [place] can  you walk comfortably? unlimited    Patient Stated Goals decrease pain, be able to move LUE to PLOF    Pain Onset More than a month ago            Ther-Ex Pulleys flex x15 abd x15 with cuing for scapulohumeral rhythm and cues to keep cervical in neutral   UE Ranger flex walks x12 with 3sec hold at end range UE ranger clockwise cir x15 counterclockwise x15   Supine AAROM IR <> ER 2x12 2-3sec hold with dowel; minimal ER   Supine AAROM abd 2x12 2-3sec; using SPC for more reach/push into abd  Supine overhead 'wand flexion' c grey physioball  3x12x3secH    Sidelying abd 1# 2x 10; minimal assistance to push into more abd   Sidelying ER 1#  2x 10     Sleeper stretch 2x 30 sec    Manual  PROM all directions G1-2 post CHJ mob in 80-90d abd G3 post GHJ mobs with movement into flex/abd/ER/IR STM to bicep (severe pain to minimal palpation), UT, and pec minor.  Pt guarded throughout all PROM especially with ER and abd                          PT Education - 09/21/20 0947    Education Details therex form/technique    Person(s) Educated Patient    Methods Explanation;Demonstration;Tactile cues;Verbal cues    Comprehension Verbalized understanding;Returned demonstration;Verbal cues required;Tactile cues required            PT Short Term Goals - 07/25/20 1209      PT SHORT TERM GOAL #1   Title Patient will demonstrate independence with HEP to be able to decrease pain and improve L shoulder function    Baseline 07/25/20 HEP given    Time 4    Period Weeks    Status New    Target Date 08/22/20             PT [place] Term Goals - 07/25/20 1212      PT [place] TERM GOAL #1   Title Patient will decrease worst pain as reported on NPRS scale by 3 points to demonstrate a clinically significant reduction in pain.    Baseline 07/25/20 10/10 at worst    Time 8    Period Weeks    Status New    Target Date 09/19/20      PT [place] TERM GOAL #2   Title Patient will  increase L shoulder AROM to be [facility] in order to complete ADLs and household activities without pain or difficulty    Baseline 07/25/20 flexion 109, ER base of skull, IR S1, abd 45    Time 8    Period Weeks  Status New    Target Date 09/19/20      PT [place] TERM GOAL #3   Title Patient will increase L shoulder MMT strength to 4/5 to be able to raise arm overhead to complete household chores and return to PLOF    Baseline 07/25/20 flexion, abd, ER, ext 2+ IR 3    Time 8    Period Weeks    Status New    Target Date 09/19/20                 Plan - 09/21/20 0948    Clinical Impression Statement Continued therex progression with manual techniques for increased mobility, strength, and scpulohumeral rhythm. Pt is able to perform therex with proper technique with no increase in pain level. Pt reported some pain with toleration to increased joint mobilization with continued motivation throughout session. PT will continue to progress as able.    Personal Factors and Comorbidities Comorbidity 2    Comorbidities R breast cancer    Examination-Activity Limitations Bathing;Sleep;Lift;Dressing;Reach Overhead;Hygiene/Grooming    Examination-Participation Restrictions Cleaning;Laundry;Yard Work;Community Activity    Stability/Clinical Decision Making Evolving/Moderate complexity    Clinical Decision Making Moderate    Rehab Potential Good    PT Frequency 2x / week    PT Duration 8 weeks    PT Treatment/Interventions ADLs/Self Care Home Management;Electrical Stimulation;Moist Heat;Iontophoresis 4mg /ml Dexamethasone;Traction;Ultrasound;Functional mobility training;Therapeutic activities;Therapeutic exercise;Neuromuscular re-education;Patient/family education;Manual techniques;Passive range of motion;Dry needling;Scar mobilization;Joint Manipulations;Other (comment)    PT Next Visit Plan continue with mobility and single plane strength    PT Home Exercise Plan AAROM pulleys, IR towel stretch, pec  stretch    Consulted and Agree with Plan of Care Patient           Patient will benefit from skilled therapeutic intervention in order to improve the following deficits and impairments:  Decreased mobility,Hypomobility,Improper body mechanics,Impaired tone,Decreased range of motion,Decreased strength,Increased fascial restricitons,Impaired flexibility,Impaired UE functional use,Postural dysfunction,Pain  Visit Diagnosis: Chronic left shoulder pain  Stiffness of left shoulder, not elsewhere classified     Problem List Patient Active Problem List   Diagnosis Date Noted  . Breast cancer ([place]) 03/21/2020  . Anemia due to antineoplastic chemotherapy 02/15/2020  . Cardiomyopathy ([place]) 12/12/2019  . Dehydration 11/24/2019  . Acute pain of left shoulder 11/24/2019  . Orthostatic hypotension 10/12/2019  . Low serum cortisol level ([place]) 09/29/2019  . Malignant neoplasm of breast in female, estrogen receptor negative ([place]) 09/28/2019  . Hypokalemia 09/15/2019  . Hypocalcemia 09/15/2019  . Normocytic anemia 09/15/2019  . B12 deficiency 09/15/2019  . Iron deficiency anemia 09/15/2019  . Encounter for antineoplastic chemotherapy 08/25/2019  . Goals of care, counseling/discussion 08/17/2019  . Weight loss 07/31/2019  . Invasive carcinoma of breast ([place]) 07/30/2019  . H/O diarrhea 01/01/2017     Durwin Reges DPT Turner Daniels, SPT  Durwin Reges 09/21/2020, 11:11 AM  [place] [facility] [address] [place], [address] Phone: [phone]   Fax:  [phone]  Name: Heather Bell MRN: [id number] Date of Birth: May 25, 1976

## 2020-09-23 ENCOUNTER — Other Ambulatory Visit: Payer: Self-pay

## 2020-09-23 ENCOUNTER — Ambulatory Visit: Payer: BC Managed Care – PPO | Admitting: Physical Therapy

## 2020-09-23 ENCOUNTER — Encounter: Payer: Self-pay | Admitting: Physical Therapy

## 2020-09-23 DIAGNOSIS — G8929 Other chronic pain: Secondary | ICD-10-CM

## 2020-09-23 DIAGNOSIS — M25512 Pain in left shoulder: Secondary | ICD-10-CM | POA: Diagnosis not present

## 2020-09-23 NOTE — Therapy (Signed)
[place] [facility] [address], [place], [address] Phone: [phone]   Fax:  [phone]  Physical Therapy Treatment  Patient Details  Name: Heather Bell MRN: [id number] Date of Birth: 1976/02/20 No data recorded  Encounter Date: 09/23/2020   PT End of Session - 09/23/20 0925    Visit Number 16    Number of Visits 17    Date for PT Re-Evaluation 09/23/20    Authorization - Visit Number 8    Authorization - Number of Visits 17    PT Start Time 0920    PT Stop Time 1005    PT Time Calculation (min) 45 min    Activity Tolerance Patient tolerated treatment well;Patient limited by pain    Behavior During Therapy [facility] for tasks assessed/performed           Past Medical History:  Diagnosis Date  . Allergic rhinitis   . Anxiety   . Bipolar disorder ([place])    depression  . Breast cancer ([place]) 03/21/2020   Right mastectomy  . Cancer [facility])    breast right  . Depression   . Diabetes ([place])   . GERD (gastroesophageal reflux disease)   . Hyperlipidemia   . Personal history of chemotherapy    Jan 2021 through July 2021    Past Surgical History:  Procedure Laterality Date  . BREAST BIOPSY Right 07/22/2019   mass bx, path pending, heart marker  . BREAST BIOPSY Right 07/22/2019   LN bx, path pending,  butterfly hydromarker  . BREAST RECONSTRUCTION WITH PLACEMENT OF TISSUE EXPANDER AND FLEX HD (ACELLULAR HYDRATED DERMIS) Right 03/21/2020   Procedure: RIGHT BREAST RECONSTRUCTION WITH PLACEMENT OF TISSUE EXPANDER AND FLEX HD (ACELLULAR HYDRATED DERMIS);  Surgeon: [name], MD;  Location: [facility];  Service: Plastics;  Laterality: Right;  . IMAGE GUIDED SINUS SURGERY    . INCISION AND DRAINAGE OF WOUND Right 05/02/2020   Procedure: IRRIGATION AND DEBRIDEMENT WOUND WITH REMOVAL OF RIGHT TISSUE EXPANDER;  Surgeon: [name], MD;  Location: [place];  Service: Plastics;  Laterality: Right;  1  hour please  . KNEE SURGERY    . MASTECTOMY Right 03/21/2020  . NASAL SEPTUM SURGERY    . PORTACATH PLACEMENT N/A 08/07/2019   Procedure: INSERTION PORT-A-CATH;  Surgeon: Herbert Pun, MD;  Location: [facility];  Service: General;  Laterality: N/A;  . TONSILLECTOMY    . TOTAL MASTECTOMY Right 03/21/2020   Procedure: TOTAL MASTECTOMY;  Surgeon: Herbert Pun, MD;  Location: [facility];  Service: General;  Laterality: Right;    There were no vitals filed for this visit.   Subjective Assessment - 09/23/20 0922    Subjective Pt reports no changes or concerns from last session. Reports compliance with HEP when able .    Pertinent History Patient is a 45 year old female with L shoulder pain that began after a L port-a-cath was inserted last December for chemotherapy treatments for R breast cancer. Pt has concluded chemo treatments, but per onocologist is to keep port for a "little while longer". Has had R masectomy and R expander placement and removal (no current plans for reconstruction). Pt reports a steady increase in pain and loss of motion over time since the port was inserted. Pt has received cortisone shots in L shoulder for bursitis last July and stated it helps decrease pain for some time but did not last very [place]. Pt had an expander put in on  R side post masectomy last year but the tissue died over it and is thinking about getting another one. Pt has difficulty and pain with all movements and states only rest and no movement makes her L shoulder feel better. Pt scales the pain a 10/10 at worst and 1/10 at best when not moving it. Pt is sleeping on her back but depending on if she rolls over, her L shoulder can be painful in the AM. Patient denies any N/V, B&B, unexplained weight loss, night pain, fever, or night sweats.    Limitations Lifting;House hold activities    How [place] can you sit comfortably? unlimited    How [place] can you stand comfortably? unlimited    How [place] can you  walk comfortably? unlimited    Patient Stated Goals decrease pain, be able to move LUE to PLOF    Pain Onset More than a month ago             Ther-Ex Pulleys flex x15abd x15with cuing for scapulohumeral rhythm andcues to keep cervical in neutral  UE Ranger flex walks x15 with 3sec hold at end range UE ranger clockwise cir x15 counterclockwise x15  Supine AAROMIR <>ER2x122-3sec holdwith SPC; minimal ER  Supine AAROM abd 2x122-3sec; using SPC for more reach/push into abd  Supine overhead Flexion #2 DB  2x12x3secH  Sidelying abd 1# 2x 10; minimal assistance to push into more abd  Sidelying ER 2#  2x 8  Seated tricep/lat stretch x 60 sec  Manual PROM all directions G1-2 post CHJ mob in 80-90d abd G3post GHJmobswithmovement intoflex/abd/ER/IR STM to bicep (severe pain to minimal palpation), UT, and pec minor.  Ptguarded throughoutall PROM especially with ER and abd                         PT Education - 09/23/20 0924    Education Details therex form/technique    Person(s) Educated Patient    Methods Explanation;Demonstration;Tactile cues;Verbal cues    Comprehension Verbalized understanding;Returned demonstration;Verbal cues required;Tactile cues required            PT Short Term Goals - 07/25/20 1209      PT SHORT TERM GOAL #1   Title Patient will demonstrate independence with HEP to be able to decrease pain and improve L shoulder function    Baseline 07/25/20 HEP given    Time 4    Period Weeks    Status New    Target Date 08/22/20             PT [place] Term Goals - 07/25/20 1212      PT [place] TERM GOAL #1   Title Patient will decrease worst pain as reported on NPRS scale by 3 points to demonstrate a clinically significant reduction in pain.    Baseline 07/25/20 10/10 at worst    Time 8    Period Weeks    Status New    Target Date 09/19/20      PT [place] TERM GOAL #2   Title Patient will increase L  shoulder AROM to be [facility] in order to complete ADLs and household activities without pain or difficulty    Baseline 07/25/20 flexion 109, ER base of skull, IR S1, abd 45    Time 8    Period Weeks    Status New    Target Date 09/19/20      PT [place] TERM GOAL #3   Title Patient will increase L shoulder MMT  strength to 4/5 to be able to raise arm overhead to complete household chores and return to PLOF    Baseline 07/25/20 flexion, abd, ER, ext 2+ IR 3    Time 8    Period Weeks    Status New    Target Date 09/19/20                 Plan - 09/23/20 5400    Clinical Impression Statement Continued therex progression with manual techniques for increased mobility, strength, and scapulohumeral rhythm. Pt is able to perform therex with proper technique and no increases in pain level and had noticeable improvements with shoulder motion. Reports tolerable increase in pain to increased joint mobilization. PT will continue to progress as able.    Personal Factors and Comorbidities Comorbidity 2    Comorbidities R breast cancer    Examination-Activity Limitations Bathing;Sleep;Lift;Dressing;Reach Overhead;Hygiene/Grooming    Examination-Participation Restrictions Cleaning;Laundry;Yard Work;Community Activity    Stability/Clinical Decision Making Evolving/Moderate complexity    Clinical Decision Making Moderate    Rehab Potential Good    PT Frequency 2x / week    PT Duration 8 weeks    PT Treatment/Interventions ADLs/Self Care Home Management;Electrical Stimulation;Moist Heat;Iontophoresis 4mg /ml Dexamethasone;Traction;Ultrasound;Functional mobility training;Therapeutic activities;Therapeutic exercise;Neuromuscular re-education;Patient/family education;Manual techniques;Passive range of motion;Dry needling;Scar mobilization;Joint Manipulations;Other (comment)    PT Next Visit Plan continue with mobility and single plane strength    PT Home Exercise Plan AAROM pulleys, IR towel stretch, pec stretch     Consulted and Agree with Plan of Care Patient           Patient will benefit from skilled therapeutic intervention in order to improve the following deficits and impairments:  Decreased mobility,Hypomobility,Improper body mechanics,Impaired tone,Decreased range of motion,Decreased strength,Increased fascial restricitons,Impaired flexibility,Impaired UE functional use,Postural dysfunction,Pain  Visit Diagnosis: Chronic left shoulder pain     Problem List Patient Active Problem List   Diagnosis Date Noted  . Breast cancer ([place]) 03/21/2020  . Anemia due to antineoplastic chemotherapy 02/15/2020  . Cardiomyopathy ([place]) 12/12/2019  . Dehydration 11/24/2019  . Acute pain of left shoulder 11/24/2019  . Orthostatic hypotension 10/12/2019  . Low serum cortisol level ([place]) 09/29/2019  . Malignant neoplasm of breast in female, estrogen receptor negative ([place]) 09/28/2019  . Hypokalemia 09/15/2019  . Hypocalcemia 09/15/2019  . Normocytic anemia 09/15/2019  . B12 deficiency 09/15/2019  . Iron deficiency anemia 09/15/2019  . Encounter for antineoplastic chemotherapy 08/25/2019  . Goals of care, counseling/discussion 08/17/2019  . Weight loss 07/31/2019  . Invasive carcinoma of breast ([place]) 07/30/2019  . H/O diarrhea 01/01/2017    Durwin Reges DPT Turner Daniels, SPT  Durwin Reges 09/23/2020, 10:49 AM  [facility] [address] [place], [address] Phone: [phone]   Fax:  [phone]  Name: DASHAWNA DELBRIDGE MRN: [id number] Date of Birth: [birth year]

## 2020-09-27 ENCOUNTER — Encounter: Payer: Self-pay | Admitting: Physical Therapy

## 2020-09-27 ENCOUNTER — Other Ambulatory Visit: Payer: Self-pay

## 2020-09-27 ENCOUNTER — Ambulatory Visit: Payer: BC Managed Care – PPO | Admitting: Physical Therapy

## 2020-09-27 DIAGNOSIS — M25512 Pain in left shoulder: Secondary | ICD-10-CM | POA: Diagnosis not present

## 2020-09-27 DIAGNOSIS — M25612 Stiffness of left shoulder, not elsewhere classified: Secondary | ICD-10-CM

## 2020-09-27 DIAGNOSIS — G8929 Other chronic pain: Secondary | ICD-10-CM

## 2020-09-27 NOTE — Therapy (Signed)
[facility] [address] [place], [address] Phone: [phone]   Fax:  [phone]  Physical Therapy Treatment/Progress Note Reporting Period 07/25/20 - 09/27/20  Patient Details  Name: Heather Bell MRN: [id number] Date of Birth: October 27, 1975 No data recorded  Encounter Date: 09/27/2020   PT End of Session - 09/27/20 1036    Visit Number 17    Number of Visits 33    Date for PT Re-Evaluation 11/25/20    Authorization - Visit Number 4    Authorization - Number of Visits 17    PT Start Time 1030    PT Stop Time 1115    PT Time Calculation (min) 45 min    Activity Tolerance Patient tolerated treatment well;Patient limited by pain    Behavior During Therapy [facility] for tasks assessed/performed           Past Medical History:  Diagnosis Date  . Allergic rhinitis   . Anxiety   . Bipolar disorder ([place])    depression  . Breast cancer ([place]) 03/21/2020   Right mastectomy  . Cancer [facility])    breast right  . Depression   . Diabetes ([place])   . GERD (gastroesophageal reflux disease)   . Hyperlipidemia   . Personal history of chemotherapy    Jan 2021 through July 2021    Past Surgical History:  Procedure Laterality Date  . BREAST BIOPSY Right 07/22/2019   mass bx, path pending, heart marker  . BREAST BIOPSY Right 07/22/2019   LN bx, path pending,  butterfly hydromarker  . BREAST RECONSTRUCTION WITH PLACEMENT OF TISSUE EXPANDER AND FLEX HD (ACELLULAR HYDRATED DERMIS) Right 03/21/2020   Procedure: RIGHT BREAST RECONSTRUCTION WITH PLACEMENT OF TISSUE EXPANDER AND FLEX HD (ACELLULAR HYDRATED DERMIS);  Surgeon: [name], MD;  Location: [facility];  Service: Plastics;  Laterality: Right;  . IMAGE GUIDED SINUS SURGERY    . INCISION AND DRAINAGE OF WOUND Right 05/02/2020   Procedure: IRRIGATION AND DEBRIDEMENT WOUND WITH REMOVAL OF RIGHT TISSUE EXPANDER;  Surgeon: [name], MD;  Location: [place];  Service: Plastics;  Laterality: Right;  1 hour please  . KNEE SURGERY    . MASTECTOMY Right 03/21/2020  . NASAL SEPTUM SURGERY    . PORTACATH PLACEMENT N/A 08/07/2019   Procedure: INSERTION PORT-A-CATH;  Surgeon: Herbert Pun, MD;  Location: [facility];  Service: General;  Laterality: N/A;  . TONSILLECTOMY    . TOTAL MASTECTOMY Right 03/21/2020   Procedure: TOTAL MASTECTOMY;  Surgeon: Herbert Pun, MD;  Location: [facility];  Service: General;  Laterality: Right;    There were no vitals filed for this visit.   Subjective Assessment - 09/27/20 1033    Subjective Pt reports some pain in the shoulder. Min compliance with HEP, reports she thinks motion is getting better slowly but surely.    Pertinent History Patient is a 45 year old female with L shoulder pain that began after a L port-a-cath was inserted last December for chemotherapy treatments for R breast cancer. Pt has concluded chemo treatments, but per onocologist is to keep port for a "little while longer". Has had R masectomy and R expander placement and removal (no current plans for reconstruction). Pt reports a steady increase in pain and loss of motion over time since the port was inserted. Pt has received cortisone shots in L shoulder for bursitis last July and stated it helps decrease pain for some time but did  not last very [place]. Pt had an expander put in on R side post masectomy last year but the tissue died over it and is thinking about getting another one. Pt has difficulty and pain with all movements and states only rest and no movement makes her L shoulder feel better. Pt scales the pain a 10/10 at worst and 1/10 at best when not moving it. Pt is sleeping on her back but depending on if she rolls over, her L shoulder can be painful in the AM. Patient denies any N/V, B&B, unexplained weight loss, night pain, fever, or night sweats.    Limitations Lifting;House hold activities    How [place] can you sit comfortably?  unlimited    How [place] can you stand comfortably? unlimited    How R[place] can you walk comfortably? unlimited    Patient Stated Goals decrease pain, be able to move LUE to PLOF    Pain Onset More than a month ago             Ther-Ex Pulleys flex x15abd x15with cuing for scapulohumeral rhythm andcues to keep cervical in neutral  Wall walk with lift off x12 with min cuing for scapular motion with good carry over  Supine AAROMIR <>ER2x122-3sec holdwith dowel; minimal E  Seated overhead flex AAROM with dowel LUE motion first with RUE assistance for increased ROM x12 with towel roll at tspine  Sidelying abd 1# 2x 12; minimal assistance to push into more abd over 90d  Sidelying ER 2#  2x 12  OMEGA chest/slight overhead press 10# 3x 6  Manual PROM all directions G1-2 post CHJ mob in 80-90d abd G3post GHJmobswithmovement intoflex/abd/ER/IR STM to bicep (severe pain to minimal palpation), UT, and pec minor.  Ptguarded throughoutall PROM especially with ER and abd                           PT Education - 09/27/20 1035    Education Details therex form/technique    Person(s) Educated Patient    Methods Explanation;Demonstration;Verbal cues    Comprehension Verbalized understanding;Returned demonstration;Verbal cues required            PT Short Term Goals - 09/27/20 1037      PT SHORT TERM GOAL #1   Title Patient will demonstrate independence with HEP to be able to decrease pain and improve L shoulder function    Baseline 07/25/20 HEP given; 09/27/20 completing 50% of the time    Time 4    Period Weeks    Status On-going             PT Kreis Term Goals - 09/27/20 1037      PT Tocci TERM GOAL #1   Title Patient will decrease worst pain as reported on NPRS scale by 3 points to demonstrate a clinically significant reduction in pain.    Baseline 07/25/20 10/10 at worst; 09/27/20 4/10    Time 8    Period Weeks    Status Achieved       PT [place]t TERM GOAL #2   Title Patient will increase L shoulder AROM to be [facility] in order to complete ADLs and household activities without pain or difficulty    Baseline 07/25/20 flexion 109, ER base of skull, IR S1, abd 45; 07/25/20 flexion 139, ER C7, IR L1, abd 110    Time 8    Period Weeks    Status Partially Met      PT  Covin TERM GOAL #3   Title Patient will increase L shoulder MMT strength to 4/5 to be able to raise arm overhead to complete household chores and return to PLOF    Baseline 07/25/20 flexion, abd, ER, ext 2+ IR 3; 09/27/20 in availible range flex: 4+ abd: 4 ER: 4- IR: 4-    Time 8    Period Weeks    Status On-going      PT Fieldhouse TERM GOAL #4   Title Patient will increase FOTO score to 64 to demonstrate predicted increase in functional mobility to complete ADLs    Baseline 07/25/20 47 09/27/20 63    Time 8    Period Weeks    Status New                 Plan - 09/27/20 1109    Clinical Impression Statement PT reassessed goals where patient has made great improvements in pain, AROM and strength in availible range. Paitent has not acheived full AROM needed for heavy ADLs and bathing, or strength needed for overhead ADLs. Patient will benefit from continued PT to continue making progress toward these goals to complete ADLs independently. Patient is able to tolerate therex progression for increased mobility and scapulo-humeral rhythm and strength with success. Pt is motivated throughout session with minimal increase in pain. PT will continue progression as able.    Personal Factors and Comorbidities Comorbidity 1;Past/Current Experience;Time since onset of injury/illness/exacerbation;Fitness    Comorbidities R breast cancer    Examination-Activity Limitations Bathing;Sleep;Lift;Dressing;Reach Overhead;Hygiene/Grooming    Examination-Participation Restrictions Cleaning;Laundry;Yard Work;Community Activity    Stability/Clinical Decision Making Evolving/Moderate complexity     Clinical Decision Making Moderate    Rehab Potential Good    PT Frequency 2x / week    PT Duration 8 weeks    PT Treatment/Interventions ADLs/Self Care Home Management;Electrical Stimulation;Moist Heat;Iontophoresis 46m/ml Dexamethasone;Traction;Ultrasound;Functional mobility training;Therapeutic activities;Therapeutic exercise;Neuromuscular re-education;Patient/family education;Manual techniques;Passive range of motion;Dry needling;Scar mobilization;Joint Manipulations;Other (comment)    PT Next Visit Plan continue with mobility and single plane strength    PT Home Exercise Plan AAROM pulleys, IR towel stretch, pec stretch    Consulted and Agree with Plan of Care Patient           Patient will benefit from skilled therapeutic intervention in order to improve the following deficits and impairments:  Decreased mobility,Hypomobility,Improper body mechanics,Impaired tone,Decreased range of motion,Decreased strength,Increased fascial restricitons,Impaired flexibility,Impaired UE functional use,Postural dysfunction,Pain  Visit Diagnosis: Chronic left shoulder pain  Stiffness of left shoulder, not elsewhere classified     Problem List Patient Active Problem List   Diagnosis Date Noted  . Breast cancer ([place] 03/21/2020  . Anemia due to antineoplastic chemotherapy 02/15/2020  . Cardiomyopathy ([place] 12/12/2019  . Dehydration 11/24/2019  . Acute pain of left shoulder 11/24/2019  . Orthostatic hypotension 10/12/2019  . Low serum cortisol level ([place] 09/29/2019  . Malignant neoplasm of breast in female, estrogen receptor negative ([place] 09/28/2019  . Hypokalemia 09/15/2019  . Hypocalcemia 09/15/2019  . Normocytic anemia 09/15/2019  . B12 deficiency 09/15/2019  . Iron deficiency anemia 09/15/2019  . Encounter for antineoplastic chemotherapy 08/25/2019  . Goals of care, counseling/discussion 08/17/2019  . Weight loss 07/31/2019  . Invasive carcinoma of breast ([place] 07/30/2019  . H/O  diarrhea 01/01/2017   CDurwin RegesDPT CDurwin Reges2/03/2021, 11:47 AM  [facility] [address] [place] [address]Phone: [phone]  Fax:  [phone] Name: Heather Bell  MRN: [id number] Date of Birth: 1976-05-16

## 2020-09-28 ENCOUNTER — Telehealth: Payer: Self-pay | Admitting: *Deleted

## 2020-09-28 NOTE — Telephone Encounter (Signed)
t called reporting that she has not heard from Dr Ferrel Logan office about port removal

## 2020-09-28 NOTE — Telephone Encounter (Signed)
  Did we send the FAXed order over for the port removal?  M

## 2020-09-30 ENCOUNTER — Ambulatory Visit: Payer: BC Managed Care – PPO | Admitting: Physical Therapy

## 2020-09-30 ENCOUNTER — Other Ambulatory Visit: Payer: Self-pay

## 2020-09-30 ENCOUNTER — Encounter: Payer: Self-pay | Admitting: Physical Therapy

## 2020-09-30 DIAGNOSIS — M25512 Pain in left shoulder: Secondary | ICD-10-CM | POA: Diagnosis not present

## 2020-09-30 DIAGNOSIS — G8929 Other chronic pain: Secondary | ICD-10-CM

## 2020-09-30 DIAGNOSIS — M25612 Stiffness of left shoulder, not elsewhere classified: Secondary | ICD-10-CM

## 2020-09-30 NOTE — Therapy (Signed)
[facility] [address], [place], [address] Phone: [phone]   Fax:  [phone]  Physical Therapy Treatment  Patient Details  Name: Heather Bell MRN: [id number] Date of Birth: Nov 09, 1975 No data recorded  Encounter Date: 09/30/2020   PT End of Session - 09/30/20 0932    Visit Number 18    Number of Visits 33    Date for PT Re-Evaluation 11/25/20    Authorization - Visit Number 18    Authorization - Number of Visits 33    PT Start Time 0921    PT Stop Time 1000    PT Time Calculation (min) 39 min    Activity Tolerance Patient tolerated treatment well;Patient limited by pain    Behavior During Therapy [facility] for tasks assessed/performed           Past Medical History:  Diagnosis Date  . Allergic rhinitis   . Anxiety   . Bipolar disorder ([place])    depression  . Breast cancer ([place]) 03/21/2020   Right mastectomy  . Cancer [facility])    breast right  . Depression   . Diabetes ([place])   . GERD (gastroesophageal reflux disease)   . Hyperlipidemia   . Personal history of chemotherapy    Jan 2021 through July 2021    Past Surgical History:  Procedure Laterality Date  . BREAST BIOPSY Right 07/22/2019   mass bx, path pending, heart marker  . BREAST BIOPSY Right 07/22/2019   LN bx, path pending,  butterfly hydromarker  . BREAST RECONSTRUCTION WITH PLACEMENT OF TISSUE EXPANDER AND FLEX HD (ACELLULAR HYDRATED DERMIS) Right 03/21/2020   Procedure: RIGHT BREAST RECONSTRUCTION WITH PLACEMENT OF TISSUE EXPANDER AND FLEX HD (ACELLULAR HYDRATED DERMIS);  Surgeon: [name], MD;  Location: [facility];  Service: Plastics;  Laterality: Right;  . IMAGE GUIDED SINUS SURGERY    . INCISION AND DRAINAGE OF WOUND Right 05/02/2020   Procedure: IRRIGATION AND DEBRIDEMENT WOUND WITH REMOVAL OF RIGHT TISSUE EXPANDER;  Surgeon: [name], MD;  Location: [place];  Service: Plastics;  Laterality: Right;  1  hour please  . KNEE SURGERY    . MASTECTOMY Right 03/21/2020  . NASAL SEPTUM SURGERY    . PORTACATH PLACEMENT N/A 08/07/2019   Procedure: INSERTION PORT-A-CATH;  Surgeon: Herbert Pun, MD;  Location: [facility];  Service: General;  Laterality: N/A;  . TONSILLECTOMY    . TOTAL MASTECTOMY Right 03/21/2020   Procedure: TOTAL MASTECTOMY;  Surgeon: Herbert Pun, MD;  Location: [facility];  Service: General;  Laterality: Right;    There were no vitals filed for this visit.   Subjective Assessment - 09/30/20 0924    Subjective Pt reports some shoulder pain from doing a lot of cleaning the past couple of days, otherwise doing well.    Pertinent History Patient is a 45 year old female with L shoulder pain that began after a L port-a-cath was inserted last December for chemotherapy treatments for R breast cancer. Pt has concluded chemo treatments, but per onocologist is to keep port for a "little while longer". Has had R masectomy and R expander placement and removal (no current plans for reconstruction). Pt reports a steady increase in pain and loss of motion over time since the port was inserted. Pt has received cortisone shots in L shoulder for bursitis last July and stated it helps decrease pain for some time but did not last very [place]. Pt had an expander  put in on R side post masectomy last year but the tissue died over it and is thinking about getting another one. Pt has difficulty and pain with all movements and states only rest and no movement makes her L shoulder feel better. Pt scales the pain a 10/10 at worst and 1/10 at best when not moving it. Pt is sleeping on her back but depending on if she rolls over, her L shoulder can be painful in the AM. Patient denies any N/V, B&B, unexplained weight loss, night pain, fever, or night sweats.    Limitations Lifting;House hold activities    How [place] can you sit comfortably? unlimited    How [place] can you stand comfortably? unlimited    How  [place] can you walk comfortably? unlimited    Patient Stated Goals decrease pain, be able to move LUE to PLOF    Pain Onset More than a month ago            Ther-Ex Pulleys flex x15 abd x15 with cuing for scapulohumeral rhythm and cues to keep cervical in neutral    Wall walk with lift off 2x12 with min cuing for scapular motion with good carry over   Supine AAROM IR <> ER 2x12 2-3sec hold with dowel; minimal E   Seated overhead flex AAROM with dowel LUE motion first with RUE assistance for increased ROM x12 with towel roll at tspine    Sidelying abd 2# 2x 12; minimal assistance to push into more abd over 90d   Sidelying ER 2#  2x 12   OMEGA chest/slight overhead press 10# 3x 6    Manual  PROM all directions G1-2 post CHJ mob in 80-90d abd G3 post GHJ mobs with movement into flex/abd/ER/IR STM to bicep (severe pain to minimal palpation), UT, and pec minor.  Pt guarded throughout all PROM especially with ER and abd                          PT Education - 09/30/20 0932    Education Details therex form/technique    Person(s) Educated Patient    Methods Explanation;Demonstration;Verbal cues    Comprehension Verbalized understanding;Returned demonstration;Verbal cues required            PT Short Term Goals - 09/27/20 1037      PT SHORT TERM GOAL #1   Title Patient will demonstrate independence with HEP to be able to decrease pain and improve L shoulder function    Baseline 07/25/20 HEP given; 09/27/20 completing 50% of the time    Time 4    Period Weeks    Status On-going             PT [place]ine Term Goals - 09/27/20 1037      PT [place]io TERM GOAL #1   Title Patient will decrease worst pain as reported on NPRS scale by 3 points to demonstrate a clinically significant reduction in pain.    Baseline 07/25/20 10/10 at worst; 09/27/20 4/10    Time 8    Period Weeks    Status Achieved      PT [place] TERM GOAL #2   Title Patient will increase L shoulder AROM  to be [facility] in order to complete ADLs and household activities without pain or difficulty    Baseline 07/25/20 flexion 109, ER base of skull, IR S1, abd 45; 07/25/20 flexion 139, ER C7, IR L1, abd 110    Time 8  Period Weeks    Status Partially Met      PT Hamil TERM GOAL #3   Title Patient will increase L shoulder MMT strength to 4/5 to be able to raise arm overhead to complete household chores and return to PLOF    Baseline 07/25/20 flexion, abd, ER, ext 2+ IR 3; 09/27/20 in availible range flex: 4+ abd: 4 ER: 4- IR: 4-    Time 8    Period Weeks    Status On-going      PT Murley TERM GOAL #4   Title Patient will increase FOTO score to 64 to demonstrate predicted increase in functional mobility to complete ADLs    Baseline 07/25/20 47 09/27/20 63    Time 8    Period Weeks    Status New                 Plan - 09/30/20 0936    Clinical Impression Statement PT continued therex progression for increased shoulder mobility, strength, and scapulohumeral rhythm. Pt is able to perform all therex with proper technique following demonstration and min cuing. Pt continues to tolerate increases in joint mobilzations and increased resistance for therex with min pain increases. PT will continue to progress as able.    Personal Factors and Comorbidities Comorbidity 1;Past/Current Experience;Time since onset of injury/illness/exacerbation;Fitness    Examination-Activity Limitations Bathing;Sleep;Lift;Dressing;Reach Overhead;Hygiene/Grooming    Examination-Participation Restrictions Cleaning;Laundry;Yard Work;Community Activity    Stability/Clinical Decision Making Evolving/Moderate complexity    Clinical Decision Making Moderate    Rehab Potential Good    PT Frequency 2x / week    PT Duration 8 weeks    PT Treatment/Interventions ADLs/Self Care Home Management;Electrical Stimulation;Moist Heat;Iontophoresis 80m/ml Dexamethasone;Traction;Ultrasound;Functional mobility training;Therapeutic  activities;Therapeutic exercise;Neuromuscular re-education;Patient/family education;Manual techniques;Passive range of motion;Dry needling;Scar mobilization;Joint Manipulations;Other (comment)    PT Next Visit Plan continue with mobility and single plane strength    PT Home Exercise Plan AAROM pulleys, IR towel stretch, pec stretch    Consulted and Agree with Plan of Care Patient           Patient will benefit from skilled therapeutic intervention in order to improve the following deficits and impairments:  Decreased mobility,Hypomobility,Improper body mechanics,Impaired tone,Decreased range of motion,Decreased strength,Increased fascial restricitons,Impaired flexibility,Impaired UE functional use,Postural dysfunction,Pain  Visit Diagnosis: Chronic left shoulder pain  Stiffness of left shoulder, not elsewhere classified     Problem List Patient Active Problem List   Diagnosis Date Noted  . Breast cancer ([place] 03/21/2020  . Anemia due to antineoplastic chemotherapy 02/15/2020  . Cardiomyopathy ([place] 12/12/2019  . Dehydration 11/24/2019  . Acute pain of left shoulder 11/24/2019  . Orthostatic hypotension 10/12/2019  . Low serum cortisol level ([place] 09/29/2019  . Malignant neoplasm of breast in female, estrogen receptor negative ([place] 09/28/2019  . Hypokalemia 09/15/2019  . Hypocalcemia 09/15/2019  . Normocytic anemia 09/15/2019  . B12 deficiency 09/15/2019  . Iron deficiency anemia 09/15/2019  . Encounter for antineoplastic chemotherapy 08/25/2019  . Goals of care, counseling/discussion 08/17/2019  . Weight loss 07/31/2019  . Invasive carcinoma of breast ([place] 07/30/2019  . H/O diarrhea 01/01/2017    CDurwin RegesDPT OTurner Daniels SPT  CDurwin Reges2/06/2021, 11:19 AM  [facility] [address] [place] [address]Phone: [phone]  Fax:  [phone] Name: ECAILEEN VERACRUZMRN:  [id number] of Birth: 523-Jan-1977

## 2020-10-04 ENCOUNTER — Encounter: Payer: Self-pay | Admitting: Physical Therapy

## 2020-10-04 ENCOUNTER — Ambulatory Visit: Payer: BC Managed Care – PPO | Admitting: Physical Therapy

## 2020-10-04 ENCOUNTER — Other Ambulatory Visit: Payer: Self-pay

## 2020-10-04 DIAGNOSIS — G8929 Other chronic pain: Secondary | ICD-10-CM

## 2020-10-04 DIAGNOSIS — M25612 Stiffness of left shoulder, not elsewhere classified: Secondary | ICD-10-CM

## 2020-10-04 DIAGNOSIS — M25512 Pain in left shoulder: Secondary | ICD-10-CM | POA: Diagnosis not present

## 2020-10-04 NOTE — Therapy (Signed)
[facility] [address], [place], [address] Phone: [phone]   Fax:  [phone]  Physical Therapy Treatment  Patient Details  Name: Heather Bell MRN: [id number] Date of Birth: [birth year] No data recorded  Encounter Date: 10/04/2020   PT End of Session - 10/04/20 1128    Visit Number 19    Number of Visits 33    Date for PT Re-Evaluation 11/25/20    Authorization - Visit Number [id number]    Authorization - Number of Visits 33    PT Start Time 1120    PT Stop Time 1200    PT Time Calculation (min) 40 min    Activity Tolerance Patient tolerated treatment well;Patient limited by pain    Behavior During Therapy [facility] for tasks assessed/performed           Past Medical History:  Diagnosis Date  . Allergic rhinitis   . Anxiety   . Bipolar disorder ([place])    depression  . Breast cancer ([place]) 03/21/2020   Right mastectomy  . Cancer [facility])    breast right  . Depression   . Diabetes ([place])   . GERD (gastroesophageal reflux disease)   . Hyperlipidemia   . Personal history of chemotherapy    Jan 2021 through July 2021    Past Surgical History:  Procedure Laterality Date  . BREAST BIOPSY Right 07/22/2019   mass bx, path pending, heart marker  . BREAST BIOPSY Right 07/22/2019   LN bx, path pending,  butterfly hydromarker  . BREAST RECONSTRUCTION WITH PLACEMENT OF TISSUE EXPANDER AND FLEX HD (ACELLULAR HYDRATED DERMIS) Right 03/21/2020   Procedure: RIGHT BREAST RECONSTRUCTION WITH PLACEMENT OF TISSUE EXPANDER AND FLEX HD (ACELLULAR HYDRATED DERMIS);  Surgeon: [name], MD;  Location: [facility];  Service: Plastics;  Laterality: Right;  . IMAGE GUIDED SINUS SURGERY    . INCISION AND DRAINAGE OF WOUND Right 05/02/2020   Procedure: IRRIGATION AND DEBRIDEMENT WOUND WITH REMOVAL OF RIGHT TISSUE EXPANDER;  Surgeon: [name], MD;  Location: [facility];  Service: Plastics;  Laterality: Right;  1  hour please  . KNEE SURGERY    . MASTECTOMY Right 03/21/2020  . NASAL SEPTUM SURGERY    . PORTACATH PLACEMENT N/A 08/07/2019   Procedure: INSERTION PORT-A-CATH;  Surgeon: Herbert Pun, MD;  Location: [facility];  Service: General;  Laterality: N/A;  . TONSILLECTOMY    . TOTAL MASTECTOMY Right 03/21/2020   Procedure: TOTAL MASTECTOMY;  Surgeon: Herbert Pun, MD;  Location: [facility];  Service: General;  Laterality: Right;    There were no vitals filed for this visit.   Subjective Assessment - 10/04/20 1120    Subjective Pt reports some shoulder pain 3/10 this am. Reports that she is going to have her port taken out this thursday.    Pertinent History Patient is a 45 year old female with L shoulder pain that began after a L port-a-cath was inserted last December for chemotherapy treatments for R breast cancer. Pt has concluded chemo treatments, but per onocologist is to keep port for a "little while longer". Has had R masectomy and R expander placement and removal (no current plans for reconstruction). Pt reports a steady increase in pain and loss of motion over time since the port was inserted. Pt has received cortisone shots in L shoulder for bursitis last July and stated it helps decrease pain for some time but did not last very [place]. Pt had  an expander put in on R side post masectomy last year but the tissue died over it and is thinking about getting another one. Pt has difficulty and pain with all movements and states only rest and no movement makes her L shoulder feel better. Pt scales the pain a 10/10 at worst and 1/10 at best when not moving it. Pt is sleeping on her back but depending on if she rolls over, her L shoulder can be painful in the AM. Patient denies any N/V, B&B, unexplained weight loss, night pain, fever, or night sweats.    Limitations Lifting;House hold activities    How Meath can you sit comfortably? unlimited    How Scherger can you stand comfortably? unlimited     How Rennaker can you walk comfortably? unlimited    Patient Stated Goals decrease pain, be able to move LUE to PLOF    Pain Onset More than a month ago            Ther-Ex Pulleys flex x15 abd x15 with cuing for scapulohumeral rhythm and cues to keep cervical in neutral    Wall walk with lift off 2x12 with min cuing for scapular motion with good carry over  TRX overhead flexion walk 2x12    Supine AAROM IR <> ER 2x12 2-3sec hold with dowel; minimal E   Seated overhead flex AAROM with dowel LUE motion first with RUE assistance for increased ROM x12 with towel roll at tspine    Sidelying abd 2# 2x 12; minimal assistance to push into more abd over 90d   Sidelying ER 2#  2x 12       Manual  PROM all directions G1-2 post CHJ mob in 80-90d abd G3 post GHJ mobs with movement into flex/abd/ER/IR STM to bicep (severe pain to minimal palpation), UT, and pec minor.  Pt guarded throughout all PROM especially with ER and abd                          PT Education - 10/04/20 1122    Education Details therex form/technique    Person(s) Educated Patient    Methods Explanation;Demonstration;Verbal cues    Comprehension Verbalized understanding;Returned demonstration;Verbal cues required            PT Short Term Goals - 09/27/20 1037      PT SHORT TERM GOAL #1   Title Patient will demonstrate independence with HEP to be able to decrease pain and improve L shoulder function    Baseline 07/25/20 HEP given; 09/27/20 completing 50% of the time    Time 4    Period Weeks    Status On-going             PT Fairburn Term Goals - 09/27/20 1037      PT Kindley TERM GOAL #1   Title Patient will decrease worst pain as reported on NPRS scale by 3 points to demonstrate a clinically significant reduction in pain.    Baseline 07/25/20 10/10 at worst; 09/27/20 4/10    Time 8    Period Weeks    Status Achieved      PT Milberger TERM GOAL #2   Title Patient will increase L shoulder AROM to  be [facility] in order to complete ADLs and household activities without pain or difficulty    Baseline 07/25/20 flexion 109, ER base of skull, IR S1, abd 45; 07/25/20 flexion 139, ER C7, IR L1, abd 110  Time 8    Period Weeks    Status Partially Met      PT Guarino TERM GOAL #3   Title Patient will increase L shoulder MMT strength to 4/5 to be able to raise arm overhead to complete household chores and return to PLOF    Baseline 07/25/20 flexion, abd, ER, ext 2+ IR 3; 09/27/20 in availible range flex: 4+ abd: 4 ER: 4- IR: 4-    Time 8    Period Weeks    Status On-going      PT Hass TERM GOAL #4   Title Patient will increase FOTO score to 64 to demonstrate predicted increase in functional mobility to complete ADLs    Baseline 07/25/20 47 09/27/20 63    Time 8    Period Weeks    Status New                 Plan - 10/04/20 1129    Clinical Impression Statement PT continued therex progression for increased shoulder mobility, strength, and scapulohumeral rhythm. Pt is able to perform all therex with proper technique following demonstration and min cuing with noticable ROM improvements. Pt continues to tolerate increases in joint mobilzation and increased resistance for therex with min pain increases. PT will continue to progress as able.    Personal Factors and Comorbidities Comorbidity 1;Past/Current Experience;Time since onset of injury/illness/exacerbation;Fitness    Comorbidities R breast cancer    Examination-Activity Limitations Bathing;Sleep;Lift;Dressing;Reach Overhead;Hygiene/Grooming    Examination-Participation Restrictions Cleaning;Laundry;Yard Work;Community Activity    Stability/Clinical Decision Making Evolving/Moderate complexity    Clinical Decision Making Moderate    Rehab Potential Good    PT Frequency 2x / week    PT Duration 8 weeks    PT Treatment/Interventions ADLs/Self Care Home Management;Electrical Stimulation;Moist Heat;Iontophoresis 4mg /ml  Dexamethasone;Traction;Ultrasound;Functional mobility training;Therapeutic activities;Therapeutic exercise;Neuromuscular re-education;Patient/family education;Manual techniques;Passive range of motion;Dry needling;Scar mobilization;Joint Manipulations;Other (comment)    PT Next Visit Plan continue with mobility and single plane strength    PT Home Exercise Plan AAROM pulleys, IR towel stretch, pec stretch    Consulted and Agree with Plan of Care Patient           Patient will benefit from skilled therapeutic intervention in order to improve the following deficits and impairments:  Decreased mobility,Hypomobility,Improper body mechanics,Impaired tone,Decreased range of motion,Decreased strength,Increased fascial restricitons,Impaired flexibility,Impaired UE functional use,Postural dysfunction,Pain  Visit Diagnosis: Chronic left shoulder pain  Stiffness of left shoulder, not elsewhere classified     Problem List Patient Active Problem List   Diagnosis Date Noted  . Breast cancer ([place]) 03/21/2020  . Anemia due to antineoplastic chemotherapy 02/15/2020  . Cardiomyopathy ([place]) 12/12/2019  . Dehydration 11/24/2019  . Acute pain of left shoulder 11/24/2019  . Orthostatic hypotension 10/12/2019  . Low serum cortisol level ([place]) 09/29/2019  . Malignant neoplasm of breast in female, estrogen receptor negative ([place]) 09/28/2019  . Hypokalemia 09/15/2019  . Hypocalcemia 09/15/2019  . Normocytic anemia 09/15/2019  . B12 deficiency 09/15/2019  . Iron deficiency anemia 09/15/2019  . Encounter for antineoplastic chemotherapy 08/25/2019  . Goals of care, counseling/discussion 08/17/2019  . Weight loss 07/31/2019  . Invasive carcinoma of breast ([place]) 07/30/2019  . H/O diarrhea 01/01/2017     Durwin Reges DPT Turner Daniels, SPT  Durwin Reges 10/04/2020, 5:06 PM  [facility] [address], [place],  [address] Phone: [phone]   Fax:  [phone]  Name: LYLIE BLACKLOCK MRN: [id number] Date of Birth:  03/20/1976   

## 2020-10-07 ENCOUNTER — Ambulatory Visit: Payer: BC Managed Care – PPO | Admitting: Physical Therapy

## 2020-10-11 ENCOUNTER — Other Ambulatory Visit: Payer: Self-pay

## 2020-10-11 ENCOUNTER — Encounter: Payer: Self-pay | Admitting: Physical Therapy

## 2020-10-11 ENCOUNTER — Ambulatory Visit: Payer: BC Managed Care – PPO | Admitting: Physical Therapy

## 2020-10-11 ENCOUNTER — Encounter: Payer: BC Managed Care – PPO | Admitting: Physical Therapy

## 2020-10-11 DIAGNOSIS — G8929 Other chronic pain: Secondary | ICD-10-CM

## 2020-10-11 DIAGNOSIS — M25612 Stiffness of left shoulder, not elsewhere classified: Secondary | ICD-10-CM

## 2020-10-11 DIAGNOSIS — M25512 Pain in left shoulder: Secondary | ICD-10-CM | POA: Diagnosis not present

## 2020-10-11 NOTE — Therapy (Signed)
[facility] [address] [place], [address] Phone: [phone]   Fax:  [phone]  Physical Therapy Treatment  Patient Details  Name: Heather Bell MRN: [id number] Date of Birth: Jul 04, 1976 No data recorded  Encounter Date: 10/11/2020   PT End of Session - 10/11/20 0949    Visit Number 20    Number of Visits 33    Date for PT Re-Evaluation 11/25/20    Authorization - Visit Number 67    Authorization - Number of Visits 33    PT Start Time 0945    PT Stop Time 1025    PT Time Calculation (min) 40 min    Activity Tolerance Patient tolerated treatment well;Patient limited by pain    Behavior During Therapy [facility] for tasks assessed/performed           Past Medical History:  Diagnosis Date  . Allergic rhinitis   . Anxiety   . Bipolar disorder ([place])    depression  . Breast cancer ([place]) 03/21/2020   Right mastectomy  . Cancer [facility]    breast right  . Depression   . Diabetes ([place])   . GERD (gastroesophageal reflux disease)   . Hyperlipidemia   . Personal history of chemotherapy    Jan 2021 through July 2021    Past Surgical History:  Procedure Laterality Date  . BREAST BIOPSY Right 07/22/2019   mass bx, path pending, heart marker  . BREAST BIOPSY Right 07/22/2019   LN bx, path pending,  butterfly hydromarker  . BREAST RECONSTRUCTION WITH PLACEMENT OF TISSUE EXPANDER AND FLEX HD (ACELLULAR HYDRATED DERMIS) Right 03/21/2020   Procedure: RIGHT BREAST RECONSTRUCTION WITH PLACEMENT OF TISSUE EXPANDER AND FLEX HD (ACELLULAR HYDRATED DERMIS);  Surgeon: [name], MD;  Location: [facility];  Service: Plastics;  Laterality: Right;  . IMAGE GUIDED SINUS SURGERY    . INCISION AND DRAINAGE OF WOUND Right 05/02/2020   Procedure: IRRIGATION AND DEBRIDEMENT WOUND WITH REMOVAL OF RIGHT TISSUE EXPANDER;  Surgeon: [name], MD;  Location: [place];  Service: Plastics;  Laterality: Right;  1  hour please  . KNEE SURGERY    . MASTECTOMY Right 03/21/2020  . NASAL SEPTUM SURGERY    . PORTACATH PLACEMENT N/A 08/07/2019   Procedure: INSERTION PORT-A-CATH;  Surgeon: Herbert Pun, MD;  Location: [facility];  Service: General;  Laterality: N/A;  . TONSILLECTOMY    . TOTAL MASTECTOMY Right 03/21/2020   Procedure: TOTAL MASTECTOMY;  Surgeon: Herbert Pun, MD;  Location: [facility];  Service: General;  Laterality: Right;    There were no vitals filed for this visit.   Subjective Assessment - 10/11/20 0946    Subjective pt reports very minimal shoulder pain, currently 1/10. Recently had port taken out 10/07/19, reports some soreness from it otherwise went well.    Pertinent History Patient is a 45 year old female with L shoulder pain that began after a L port-a-cath was inserted last December for chemotherapy treatments for R breast cancer. Pt has concluded chemo treatments, but per onocologist is to keep port for a "little while longer". Has had R masectomy and R expander placement and removal (no current plans for reconstruction). Pt reports a steady increase in pain and loss of motion over time since the port was inserted. Pt has received cortisone shots in L shoulder for bursitis last July and stated it helps decrease pain for some time but did not last very [place]. Pt  had an expander put in on R side post masectomy last year but the tissue died over it and is thinking about getting another one. Pt has difficulty and pain with all movements and states only rest and no movement makes her L shoulder feel better. Pt scales the pain a 10/10 at worst and 1/10 at best when not moving it. Pt is sleeping on her back but depending on if she rolls over, her L shoulder can be painful in the AM. Patient denies any N/V, B&B, unexplained weight loss, night pain, fever, or night sweats.    Limitations Lifting;House hold activities    How Joynt can you sit comfortably? unlimited    How Ly can you  stand comfortably? unlimited    How Hampe can you walk comfortably? unlimited    Patient Stated Goals decrease pain, be able to move LUE to PLOF    Pain Onset More than a month ago              Ther-Ex Pulleys flex x15 abd x15 with cuing for scapulohumeral rhythm and cues to keep cervical in neutral    TRX overhead flexion walk 2x12   TRX overhead abd walk 2x 12; cuing to not rotate torso w/ good carry over   Supine AAROM IR <> ER x12 2-3sec hold with dowel; minimal E  Sidelying abd 2# 2x 12;    Sidelying ER 2#  2x 12;  Seated overhead press 1# DB 3x10; w/ towel roll at tspine  Lat pull down 2x 8 5#; cuing for shoulder blade retraction with good carry over      Manual  PROM all directions G1-2 post CHJ mob in 80-90d abd G3 post GHJ mobs with movement into flex/abd/ER/IR STM to bicep (severe pain to minimal palpation), UT, and pec minor.  Pt guarded throughout all PROM especially with ER and abd                        PT Education - 10/11/20 0949    Education Details therex for/technique    Person(s) Educated Patient    Methods Explanation;Demonstration;Verbal cues    Comprehension Verbalized understanding;Returned demonstration;Verbal cues required            PT Short Term Goals - 09/27/20 1037      PT SHORT TERM GOAL #1   Title Patient will demonstrate independence with HEP to be able to decrease pain and improve L shoulder function    Baseline 07/25/20 HEP given; 09/27/20 completing 50% of the time    Time 4    Period Weeks    Status On-going             PT Breit Term Goals - 09/27/20 1037      PT Porzio TERM GOAL #1   Title Patient will decrease worst pain as reported on NPRS scale by 3 points to demonstrate a clinically significant reduction in pain.    Baseline 07/25/20 10/10 at worst; 09/27/20 4/10    Time 8    Period Weeks    Status Achieved      PT Baldi TERM GOAL #2   Title Patient will increase L shoulder AROM to be [facility] in  order to complete ADLs and household activities without pain or difficulty    Baseline 07/25/20 flexion 109, ER base of skull, IR S1, abd 45; 07/25/20 flexion 139, ER C7, IR L1, abd 110    Time 8    Period  Weeks    Status Partially Met      PT Widjaja TERM GOAL #3   Title Patient will increase L shoulder MMT strength to 4/5 to be able to raise arm overhead to complete household chores and return to PLOF    Baseline 07/25/20 flexion, abd, ER, ext 2+ IR 3; 09/27/20 in availible range flex: 4+ abd: 4 ER: 4- IR: 4-    Time 8    Period Weeks    Status On-going      PT Falck TERM GOAL #4   Title Patient will increase FOTO score to 64 to demonstrate predicted increase in functional mobility to complete ADLs    Baseline 07/25/20 47 09/27/20 63    Time 8    Period Weeks    Status New                 Plan - 10/11/20 0951    Clinical Impression Statement PT continued therex progression for increased shoulder mobility, strength, and scapulohumeral rhythm. pt is able to perform all therex with proper technique following demonstration and min cuing. Pt continues to tolerate therex with less rest times in between sets and increases in therex with overhead motions and increased joint mobilizations with min pain increases. PT will continue to progress as able.    Personal Factors and Comorbidities Comorbidity 1;Past/Current Experience;Time since onset of injury/illness/exacerbation;Fitness    Comorbidities R breast cancer    Examination-Activity Limitations Bathing;Sleep;Lift;Dressing;Reach Overhead;Hygiene/Grooming    Examination-Participation Restrictions Cleaning;Laundry;Yard Work;Community Activity    Stability/Clinical Decision Making Evolving/Moderate complexity    Clinical Decision Making Moderate    Rehab Potential Good    PT Frequency 2x / week    PT Duration 8 weeks    PT Treatment/Interventions ADLs/Self Care Home Management;Electrical Stimulation;Moist Heat;Iontophoresis 4mg /ml  Dexamethasone;Traction;Ultrasound;Functional mobility training;Therapeutic activities;Therapeutic exercise;Neuromuscular re-education;Patient/family education;Manual techniques;Passive range of motion;Dry needling;Scar mobilization;Joint Manipulations;Other (comment)    PT Next Visit Plan continue with mobility and single plane strength    PT Home Exercise Plan AAROM pulleys, IR towel stretch, pec stretch    Consulted and Agree with Plan of Care Patient           Patient will benefit from skilled therapeutic intervention in order to improve the following deficits and impairments:  Decreased mobility,Hypomobility,Improper body mechanics,Impaired tone,Decreased range of motion,Decreased strength,Increased fascial restricitons,Impaired flexibility,Impaired UE functional use,Postural dysfunction,Pain  Visit Diagnosis: Chronic left shoulder pain  Stiffness of left shoulder, not elsewhere classified     Problem List Patient Active Problem List   Diagnosis Date Noted  . Breast cancer ([place]) 03/21/2020  . Anemia due to antineoplastic chemotherapy 02/15/2020  . Cardiomyopathy ([place]) 12/12/2019  . Dehydration 11/24/2019  . Acute pain of left shoulder 11/24/2019  . Orthostatic hypotension 10/12/2019  . Low serum cortisol level ([place]) 09/29/2019  . Malignant neoplasm of breast in female, estrogen receptor negative ([place]) 09/28/2019  . Hypokalemia 09/15/2019  . Hypocalcemia 09/15/2019  . Normocytic anemia 09/15/2019  . B12 deficiency 09/15/2019  . Iron deficiency anemia 09/15/2019  . Encounter for antineoplastic chemotherapy 08/25/2019  . Goals of care, counseling/discussion 08/17/2019  . Weight loss 07/31/2019  . Invasive carcinoma of breast ([place]) 07/30/2019  . H/O diarrhea 01/01/2017     Durwin Reges DPT Turner Daniels, SPT  Durwin Reges 10/11/2020, 3:17 PM  [facility] [address], [place],  [address] Phone: [phone]   Fax:  [phone]  Name: Heather Bell MRN: [id number] Date of Birth:  04/01/1976   

## 2020-10-13 ENCOUNTER — Ambulatory Visit: Payer: BC Managed Care – PPO | Admitting: Physical Therapy

## 2020-10-13 ENCOUNTER — Other Ambulatory Visit: Payer: Self-pay

## 2020-10-13 ENCOUNTER — Encounter: Payer: Self-pay | Admitting: Physical Therapy

## 2020-10-13 DIAGNOSIS — M25612 Stiffness of left shoulder, not elsewhere classified: Secondary | ICD-10-CM

## 2020-10-13 DIAGNOSIS — M25512 Pain in left shoulder: Secondary | ICD-10-CM

## 2020-10-13 DIAGNOSIS — G8929 Other chronic pain: Secondary | ICD-10-CM

## 2020-10-13 NOTE — Therapy (Signed)
[facility] [address], [place], [address] Phone: [phone]   Fax:  [phone]  Physical Therapy Treatment  Patient Details  Name: Heather Bell MRN: [id number] Date of Birth: 02-21-1976 No data recorded  Encounter Date: 10/13/2020   PT End of Session - 10/13/20 1132    Visit Number 21    Number of Visits 33    Date for PT Re-Evaluation 11/25/20    Authorization - Visit Number 21    Authorization - Number of Visits 33    PT Start Time 1110    PT Stop Time 1150    PT Time Calculation (min) 40 min    Activity Tolerance Patient tolerated treatment well;Patient limited by pain    Behavior During Therapy [facility] for tasks assessed/performed           Past Medical History:  Diagnosis Date  . Allergic rhinitis   . Anxiety   . Bipolar disorder ([place])    depression  . Breast cancer ([place]) 03/21/2020   Right mastectomy  . Cancer [facility])    breast right  . Depression   . Diabetes ([place])   . GERD (gastroesophageal reflux disease)   . Hyperlipidemia   . Personal history of chemotherapy    Jan 2021 through July 2021    Past Surgical History:  Procedure Laterality Date  . BREAST BIOPSY Right 07/22/2019   mass bx, path pending, heart marker  . BREAST BIOPSY Right 07/22/2019   LN bx, path pending,  butterfly hydromarker  . BREAST RECONSTRUCTION WITH PLACEMENT OF TISSUE EXPANDER AND FLEX HD (ACELLULAR HYDRATED DERMIS) Right 03/21/2020   Procedure: RIGHT BREAST RECONSTRUCTION WITH PLACEMENT OF TISSUE EXPANDER AND FLEX HD (ACELLULAR HYDRATED DERMIS);  Surgeon: [name], MD;  Location: [facility];  Service: Plastics;  Laterality: Right;  . IMAGE GUIDED SINUS SURGERY    . INCISION AND DRAINAGE OF WOUND Right 05/02/2020   Procedure: IRRIGATION AND DEBRIDEMENT WOUND WITH REMOVAL OF RIGHT TISSUE EXPANDER;  Surgeon: [name], MD;  Location: [facility];  Service: Plastics;  Laterality: Right;  1  hour please  . KNEE SURGERY    . MASTECTOMY Right 03/21/2020  . NASAL SEPTUM SURGERY    . PORTACATH PLACEMENT N/A 08/07/2019   Procedure: INSERTION PORT-A-CATH;  Surgeon: Herbert Pun, MD;  Location: [facility];  Service: General;  Laterality: N/A;  . TONSILLECTOMY    . TOTAL MASTECTOMY Right 03/21/2020   Procedure: TOTAL MASTECTOMY;  Surgeon: Herbert Pun, MD;  Location: [facility];  Service: General;  Laterality: Right;    There were no vitals filed for this visit.   Subjective Assessment - 10/13/20 1115    Subjective Reports her pain hovers around 2-3/10. Reports she is completing HEP without question or concern.    Pertinent History Patient is a 45 year old female with L shoulder pain that began after a L port-a-cath was inserted last December for chemotherapy treatments for R breast cancer. Pt has concluded chemo treatments, but per onocologist is to keep port for a "little while longer". Has had R masectomy and R expander placement and removal (no current plans for reconstruction). Pt reports a steady increase in pain and loss of motion over time since the port was inserted. Pt has received cortisone shots in L shoulder for bursitis last July and stated it helps decrease pain for some time but did not last very [place]. Pt had an expander put in on R  side post masectomy last year but the tissue died over it and is thinking about getting another one. Pt has difficulty and pain with all movements and states only rest and no movement makes her L shoulder feel better. Pt scales the pain a 10/10 at worst and 1/10 at best when not moving it. Pt is sleeping on her back but depending on if she rolls over, her L shoulder can be painful in the AM. Patient denies any N/V, B&B, unexplained weight loss, night pain, fever, or night sweats.    Limitations Lifting;House hold activities    How [place] can you sit comfortably? unlimited    How [place] can you stand comfortably? unlimited    How [place] can  you walk comfortably? unlimited    Patient Stated Goals decrease pain, be able to move LUE to PLOF    Pain Onset More than a month ago              Ther-Ex TRX overhead flexionwalk2x12   TRX overhead abd walk 2x 12; cuing to not rotate torso w/ good carry over  Lat pull down x10 5#; 2x 10 10#; cuing for shoulder blade retraction with good carry over  Seated military press 2# DB  bilat with cuing for maintained scapular retraction  Standing scaption 2# with min cuing for set up with good carry over following for proper technique  Manual PROM all directions G1-2 post CHJ mob in 80-90d abd G3post GHJmobswithmovement intoflex/abd/ER/IR STM to bicep (severe pain to minimal palpation), UT, and pec minor.  Ptguarded throughoutall PROM especially with ER and abd                     PT Education - 10/13/20 1123    Education Details therex form/technique    Person(s) Educated Patient    Methods Explanation;Demonstration;Verbal cues    Comprehension Verbalized understanding;Returned demonstration;Verbal cues required            PT Short Term Goals - 09/27/20 1037      PT SHORT TERM GOAL #1   Title Patient will demonstrate independence with HEP to be able to decrease pain and improve L shoulder function    Baseline 07/25/20 HEP given; 09/27/20 completing 50% of the time    Time 4    Period Weeks    Status On-going             PT Rosal Term Goals - 09/27/20 1037      PT Rader TERM GOAL #1   Title Patient will decrease worst pain as reported on NPRS scale by 3 points to demonstrate a clinically significant reduction in pain.    Baseline 07/25/20 10/10 at worst; 09/27/20 4/10    Time 8    Period Weeks    Status Achieved      PT Belter TERM GOAL #2   Title Patient will increase L shoulder AROM to be [facility] in order to complete ADLs and household activities without pain or difficulty    Baseline 07/25/20 flexion 109, ER base of skull, IR S1, abd 45;  07/25/20 flexion 139, ER C7, IR L1, abd 110    Time 8    Period Weeks    Status Partially Met      PT Rominger TERM GOAL #3   Title Patient will increase L shoulder MMT strength to 4/5 to be able to raise arm overhead to complete household chores and return to PLOF    Baseline 07/25/20 flexion, abd,  ER, ext 2+ IR 3; 09/27/20 in availible range flex: 4+ abd: 4 ER: 4- IR: 4-    Time 8    Period Weeks    Status On-going      PT Ortwein TERM GOAL #4   Title Patient will increase FOTO score to 64 to demonstrate predicted increase in functional mobility to complete ADLs    Baseline 07/25/20 47 09/27/20 63    Time 8    Period Weeks    Status New                 Plan - 10/13/20 1156    Clinical Impression Statement PT continued therex progression for increased shoulder mobility and strength, with focus on scapulohumeral rhythm. Patient is demonstrating better mobility (P/AA/AROM) with limitations in strength inhibiting PROM = AROM. Patient with some increased tension and pain of bicep muscle belly. PT educated patient on self massage, heat and preventing guarding to help with this with good understanding. PT will continue progression as able.    Personal Factors and Comorbidities Comorbidity 1;Past/Current Experience;Time since onset of injury/illness/exacerbation;Fitness    Comorbidities R breast cancer    Examination-Activity Limitations Bathing;Sleep;Lift;Dressing;Reach Overhead;Hygiene/Grooming    Examination-Participation Restrictions Cleaning;Laundry;Yard Work;Community Activity    Stability/Clinical Decision Making Evolving/Moderate complexity    Clinical Decision Making Moderate    Rehab Potential Good    PT Frequency 2x / week    PT Duration 8 weeks    PT Treatment/Interventions ADLs/Self Care Home Management;Electrical Stimulation;Moist Heat;Iontophoresis 68m/ml Dexamethasone;Traction;Ultrasound;Functional mobility training;Therapeutic activities;Therapeutic exercise;Neuromuscular  re-education;Patient/family education;Manual techniques;Passive range of motion;Dry needling;Scar mobilization;Joint Manipulations;Other (comment)    PT Next Visit Plan continue with mobility and single plane strength    PT Home Exercise Plan AAROM pulleys, IR towel stretch, pec stretch    Consulted and Agree with Plan of Care Patient           Patient will benefit from skilled therapeutic intervention in order to improve the following deficits and impairments:  Decreased mobility,Hypomobility,Improper body mechanics,Impaired tone,Decreased range of motion,Decreased strength,Increased fascial restricitons,Impaired flexibility,Impaired UE functional use,Postural dysfunction,Pain  Visit Diagnosis: Chronic left shoulder pain  Stiffness of left shoulder, not elsewhere classified     Problem List Patient Active Problem List   Diagnosis Date Noted  . Breast cancer ([place] 03/21/2020  . Anemia due to antineoplastic chemotherapy 02/15/2020  . Cardiomyopathy ([place] 12/12/2019  . Dehydration 11/24/2019  . Acute pain of left shoulder 11/24/2019  . Orthostatic hypotension 10/12/2019  . Low serum cortisol level ([place] 09/29/2019  . Malignant neoplasm of breast in female, estrogen receptor negative ([place] 09/28/2019  . Hypokalemia 09/15/2019  . Hypocalcemia 09/15/2019  . Normocytic anemia 09/15/2019  . B12 deficiency 09/15/2019  . Iron deficiency anemia 09/15/2019  . Encounter for antineoplastic chemotherapy 08/25/2019  . Goals of care, counseling/discussion 08/17/2019  . Weight loss 07/31/2019  . Invasive carcinoma of breast ([place] 07/30/2019  . H/O diarrhea 01/01/2017   CDurwin RegesDPT CDurwin Reges2/24/2022, 1:22 PM  [facility] [address] [place] [address]Phone: [phone]  Fax:  [phone] Name: Heather ARONMRN: [id number] of Birth: [birth year]

## 2020-10-17 ENCOUNTER — Other Ambulatory Visit: Payer: Self-pay

## 2020-10-17 ENCOUNTER — Ambulatory Visit: Payer: BC Managed Care – PPO | Admitting: Physical Therapy

## 2020-10-17 ENCOUNTER — Encounter: Payer: Self-pay | Admitting: Physical Therapy

## 2020-10-17 DIAGNOSIS — G8929 Other chronic pain: Secondary | ICD-10-CM

## 2020-10-17 DIAGNOSIS — M25512 Pain in left shoulder: Secondary | ICD-10-CM

## 2020-10-17 DIAGNOSIS — M25612 Stiffness of left shoulder, not elsewhere classified: Secondary | ICD-10-CM

## 2020-10-17 NOTE — Therapy (Addendum)
[facility] [address], [place], [address] Phone: [phone]   Fax:  [phone]  Physical Therapy Treatment  Patient Details  Name: Heather Bell MRN: [id number] Date of Birth: January 23, 1976 No data recorded  Encounter Date: 10/17/2020   PT End of Session - 10/17/20 1038    Visit Number 22    Number of Visits 33    Date for PT Re-Evaluation 11/25/20    Authorization - Visit Number 85    Authorization - Number of Visits 33    PT Start Time 8676    PT Stop Time 1112    PT Time Calculation (min) 40 min    Activity Tolerance Patient tolerated treatment well;Patient limited by pain    Behavior During Therapy [facility] for tasks assessed/performed           Past Medical History:  Diagnosis Date  . Allergic rhinitis   . Anxiety   . Bipolar disorder ([place])    depression  . Breast cancer ([place]) 03/21/2020   Right mastectomy  . Cancer [facility])    breast right  . Depression   . Diabetes ([place])   . GERD (gastroesophageal reflux disease)   . Hyperlipidemia   . Personal history of chemotherapy    Jan 2021 through July 2021    Past Surgical History:  Procedure Laterality Date  . BREAST BIOPSY Right 07/22/2019   mass bx, path pending, heart marker  . BREAST BIOPSY Right 07/22/2019   LN bx, path pending,  butterfly hydromarker  . BREAST RECONSTRUCTION WITH PLACEMENT OF TISSUE EXPANDER AND FLEX HD (ACELLULAR HYDRATED DERMIS) Right 03/21/2020   Procedure: RIGHT BREAST RECONSTRUCTION WITH PLACEMENT OF TISSUE EXPANDER AND FLEX HD (ACELLULAR HYDRATED DERMIS);  Surgeon: [name], MD;  Location: [facility];  Service: Plastics;  Laterality: Right;  . IMAGE GUIDED SINUS SURGERY    . INCISION AND DRAINAGE OF WOUND Right 05/02/2020   Procedure: IRRIGATION AND DEBRIDEMENT WOUND WITH REMOVAL OF RIGHT TISSUE EXPANDER;  Surgeon: [name], MD;  Location: [facility];  Service: Plastics;  Laterality: Right;  1  hour please  . KNEE SURGERY    . MASTECTOMY Right 03/21/2020  . NASAL SEPTUM SURGERY    . PORTACATH PLACEMENT N/A 08/07/2019   Procedure: INSERTION PORT-A-CATH;  Surgeon: Herbert Pun, MD;  Location: [facility];  Service: General;  Laterality: N/A;  . TONSILLECTOMY    . TOTAL MASTECTOMY Right 03/21/2020   Procedure: TOTAL MASTECTOMY;  Surgeon: Herbert Pun, MD;  Location: [facility];  Service: General;  Laterality: Right;    There were no vitals filed for this visit.   Subjective Assessment - 10/17/20 1032    Subjective pt reports 3/10 shoulder pain. Reported some bad pain overnight, had to take medicine.    Pertinent History Patient is a 45 year old female with L shoulder pain that began after a L port-a-cath was inserted last December for chemotherapy treatments for R breast cancer. Pt has concluded chemo treatments, but per onocologist is to keep port for a "little while longer". Has had R masectomy and R expander placement and removal (no current plans for reconstruction). Pt reports a steady increase in pain and loss of motion over time since the port was inserted. Pt has received cortisone shots in L shoulder for bursitis last July and stated it helps decrease pain for some time but did not last very [place]. Pt had an expander put in on R side  post masectomy last year but the tissue died over it and is thinking about getting another one. Pt has difficulty and pain with all movements and states only rest and no movement makes her L shoulder feel better. Pt scales the pain a 10/10 at worst and 1/10 at best when not moving it. Pt is sleeping on her back but depending on if she rolls over, her L shoulder can be painful in the AM. Patient denies any N/V, B&B, unexplained weight loss, night pain, fever, or night sweats.    Limitations Lifting;House hold activities    How [place] can you sit comfortably? unlimited    How [place] can you stand comfortably? unlimited    How [place] can you walk  comfortably? unlimited    Patient Stated Goals decrease pain, be able to move LUE to PLOF    Pain Onset More than a month ago             Ther-Ex TRX overhead flexion walk 2x12    TRX overhead abd walk 2x 12; cuing to not rotate torso w/ good carry over   Lat pull down 3x 10 10#; cuing for shoulder blade retraction with good carry over    Seated military press 2# 3x10  DB  bilat with cuing for maintained scapular retraction   Standing scaption RTB  2x10 with min cuing for set up with good carry over following for proper technique  Standing IR w/ YTB 2x10; shoulder at 90 deg abd; min cuing to maintain shoulder at 90 deg   Prone Y scaption w/ LE hanging edge of mat; 2x10 with resistance at top of range.   Manual  PROM all directions G1-2 post CHJ mob in 80-90d abd G3 post GHJ mobs with movement into flex/abd/ER/IR STM to bicep (severe pain to minimal palpation), UT, and pec minor.  Pt guarded throughout all PROM especially with ER and abd                         PT Education - 10/17/20 1037    Education Details therex form/technique    Person(s) Educated Patient    Methods Explanation;Demonstration;Verbal cues    Comprehension Verbalized understanding;Returned demonstration;Verbal cues required            PT Short Term Goals - 09/27/20 1037      PT SHORT TERM GOAL #1   Title Patient will demonstrate independence with HEP to be able to decrease pain and improve L shoulder function    Baseline 07/25/20 HEP given; 09/27/20 completing 50% of the time    Time 4    Period Weeks    Status On-going             PT Beadles Term Goals - 09/27/20 1037      PT [place]ico TERM GOAL #1   Title Patient will decrease worst pain as reported on NPRS scale by 3 points to demonstrate a clinically significant reduction in pain.    Baseline 07/25/20 10/10 at worst; 09/27/20 4/10    Time 8    Period Weeks    Status Achieved      PT Hinton TERM GOAL #2   Title Patient will  increase L shoulder AROM to be [facility] in order to complete ADLs and household activities without pain or difficulty    Baseline 07/25/20 flexion 109, ER base of skull, IR S1, abd 45; 07/25/20 flexion 139, ER C7, IR L1, abd 110  Time 8    Period Weeks    Status Partially Met      PT Ke TERM GOAL #3   Title Patient will increase L shoulder MMT strength to 4/5 to be able to raise arm overhead to complete household chores and return to PLOF    Baseline 07/25/20 flexion, abd, ER, ext 2+ IR 3; 09/27/20 in availible range flex: 4+ abd: 4 ER: 4- IR: 4-    Time 8    Period Weeks    Status On-going      PT Prill TERM GOAL #4   Title Patient will increase FOTO score to 64 to demonstrate predicted increase in functional mobility to complete ADLs    Baseline 07/25/20 47 09/27/20 63    Time 8    Period Weeks    Status New                 Plan - 10/17/20 1039    Clinical Impression Statement PT continued therex progression for increased shoulder mobility, strength, and scapulohumeral rhythm. pt is able to perform all therex with proper technique following demonstration and min cuing.  Pt continues to demonstrate increased shoulder mobility and increased toleration to therex requiring more overhead motion. Continues to tolerate increased joint mobilizations with tolerable increases in pain. PT will continue to progress as able    Personal Factors and Comorbidities Comorbidity 1;Past/Current Experience;Time since onset of injury/illness/exacerbation;Fitness    Comorbidities R breast cancer    Examination-Activity Limitations Bathing;Sleep;Lift;Dressing;Reach Overhead;Hygiene/Grooming    Examination-Participation Restrictions Cleaning;Laundry;Yard Work;Community Activity    Stability/Clinical Decision Making Evolving/Moderate complexity    Clinical Decision Making Moderate    Rehab Potential Good    PT Frequency 2x / week    PT Duration 8 weeks    PT Treatment/Interventions ADLs/Self Care Home  Management;Electrical Stimulation;Moist Heat;Iontophoresis 24m/ml Dexamethasone;Traction;Ultrasound;Functional mobility training;Therapeutic activities;Therapeutic exercise;Neuromuscular re-education;Patient/family education;Manual techniques;Passive range of motion;Dry needling;Scar mobilization;Joint Manipulations;Other (comment)    PT Next Visit Plan continue with mobility and single plane strength    PT Home Exercise Plan AAROM pulleys, IR towel stretch, pec stretch    Consulted and Agree with Plan of Care Patient           Patient will benefit from skilled therapeutic intervention in order to improve the following deficits and impairments:  Decreased mobility,Hypomobility,Improper body mechanics,Impaired tone,Decreased range of motion,Decreased strength,Increased fascial restricitons,Impaired flexibility,Impaired UE functional use,Postural dysfunction,Pain  Visit Diagnosis: Chronic left shoulder pain  Stiffness of left shoulder, not elsewhere classified     Problem List Patient Active Problem List   Diagnosis Date Noted  . Breast cancer ([place] 03/21/2020  . Anemia due to antineoplastic chemotherapy 02/15/2020  . Cardiomyopathy ([place] 12/12/2019  . Dehydration 11/24/2019  . Acute pain of left shoulder 11/24/2019  . Orthostatic hypotension 10/12/2019  . Low serum cortisol level ([place] 09/29/2019  . Malignant neoplasm of breast in female, estrogen receptor negative ([place] 09/28/2019  . Hypokalemia 09/15/2019  . Hypocalcemia 09/15/2019  . Normocytic anemia 09/15/2019  . B12 deficiency 09/15/2019  . Iron deficiency anemia 09/15/2019  . Encounter for antineoplastic chemotherapy 08/25/2019  . Goals of care, counseling/discussion 08/17/2019  . Weight loss 07/31/2019  . Invasive carcinoma of breast ([place] 07/30/2019  . H/O diarrhea 01/01/2017     CDurwin RegesDPT OTurner Daniels SPT  CDurwin Reges2/28/2022, 12:47 PM  [facility] [address] [place] [address]Phone: [phone]  Fax:  [phone] Name: Heather  GIORGIA Bell MRN: [id number] Date of Birth: 1976/05/12

## 2020-10-20 ENCOUNTER — Other Ambulatory Visit: Payer: Self-pay

## 2020-10-20 ENCOUNTER — Ambulatory Visit: Payer: BC Managed Care – PPO | Attending: Sports Medicine | Admitting: Physical Therapy

## 2020-10-20 ENCOUNTER — Encounter: Payer: Self-pay | Admitting: Physical Therapy

## 2020-10-20 DIAGNOSIS — M25512 Pain in left shoulder: Secondary | ICD-10-CM | POA: Insufficient documentation

## 2020-10-20 DIAGNOSIS — G8929 Other chronic pain: Secondary | ICD-10-CM | POA: Insufficient documentation

## 2020-10-20 DIAGNOSIS — M25612 Stiffness of left shoulder, not elsewhere classified: Secondary | ICD-10-CM | POA: Diagnosis present

## 2020-10-20 NOTE — Therapy (Signed)
[facility] [address] [place], [address] Phone: [phone]   Fax:  [phone]  Physical Therapy Treatment  Patient Details  Name: Heather Bell MRN: [id number] Date of Birth: 07-02-1976 No data recorded  Encounter Date: 10/20/2020   PT End of Session - 10/20/20 0952    Visit Number 23    Number of Visits 33    Date for PT Re-Evaluation 11/25/20    Authorization - Visit Number 23    Authorization - Number of Visits 33    PT Start Time 0945    PT Stop Time 1030    PT Time Calculation (min) 45 min    Activity Tolerance Patient tolerated treatment well;Patient limited by pain    Behavior During Therapy [facility] for tasks assessed/performed           Past Medical History:  Diagnosis Date  . Allergic rhinitis   . Anxiety   . Bipolar disorder ([place])    depression  . Breast cancer ([place]) 03/21/2020   Right mastectomy  . Cancer [facility])    breast right  . Depression   . Diabetes ([place])   . GERD (gastroesophageal reflux disease)   . Hyperlipidemia   . Personal history of chemotherapy    Jan 2021 through July 2021    Past Surgical History:  Procedure Laterality Date  . BREAST BIOPSY Right 07/22/2019   mass bx, path pending, heart marker  . BREAST BIOPSY Right 07/22/2019   LN bx, path pending,  butterfly hydromarker  . BREAST RECONSTRUCTION WITH PLACEMENT OF TISSUE EXPANDER AND FLEX HD (ACELLULAR HYDRATED DERMIS) Right 03/21/2020   Procedure: RIGHT BREAST RECONSTRUCTION WITH PLACEMENT OF TISSUE EXPANDER AND FLEX HD (ACELLULAR HYDRATED DERMIS);  Surgeon: [name], MD;  Location: [facility];  Service: Plastics;  Laterality: Right;  . IMAGE GUIDED SINUS SURGERY    . INCISION AND DRAINAGE OF WOUND Right 05/02/2020   Procedure: IRRIGATION AND DEBRIDEMENT WOUND WITH REMOVAL OF RIGHT TISSUE EXPANDER;  Surgeon: [name], MD;  Location: [place];  Service: Plastics;  Laterality: Right;  1  hour please  . KNEE SURGERY    . MASTECTOMY Right 03/21/2020  . NASAL SEPTUM SURGERY    . PORTACATH PLACEMENT N/A 08/07/2019   Procedure: INSERTION PORT-A-CATH;  Surgeon: Herbert Pun, MD;  Location: [facility];  Service: General;  Laterality: N/A;  . TONSILLECTOMY    . TOTAL MASTECTOMY Right 03/21/2020   Procedure: TOTAL MASTECTOMY;  Surgeon: Herbert Pun, MD;  Location: [facility];  Service: General;  Laterality: Right;    There were no vitals filed for this visit.   Subjective Assessment - 10/20/20 0948    Subjective Reports 2/10 shoulder pain today. Reports pain is getting better over time but she does still have "bad days". Minimal HEP compliance.    Pertinent History Patient is a 45 year old female with L shoulder pain that began after a L port-a-cath was inserted last December for chemotherapy treatments for R breast cancer. Pt has concluded chemo treatments, but per onocologist is to keep port for a "little while longer". Has had R masectomy and R expander placement and removal (no current plans for reconstruction). Pt reports a steady increase in pain and loss of motion over time since the port was inserted. Pt has received cortisone shots in L shoulder for bursitis last July and stated it helps decrease pain for some time but did not last very [place]. Pt  had an expander put in on R side post masectomy last year but the tissue died over it and is thinking about getting another one. Pt has difficulty and pain with all movements and states only rest and no movement makes her L shoulder feel better. Pt scales the pain a 10/10 at worst and 1/10 at best when not moving it. Pt is sleeping on her back but depending on if she rolls over, her L shoulder can be painful in the AM. Patient denies any N/V, B&B, unexplained weight loss, night pain, fever, or night sweats.    How [place] can you sit comfortably? unlimited    How [place] can you stand comfortably? unlimited    How [place] can you walk  comfortably? unlimited    Patient Stated Goals decrease pain, be able to move LUE to PLOF    Pain Onset More than a month ago           Ther-Ex TRX overhead flexionwalkx15  TRX overhead abd walk x15  Seated military press 2# 3x10  DB bilat with cuing for maintained scapular retraction  Seated scaption 2# 3x10 with min cuing for scapular retraction with good carry over  Prone row 2# 3x 10 with max cuing initially with good carry over  Prone W 2x 8 with elbow lift only cuing for scapular retraction with good carry over  Y on wall with 2# DB 2x 10 with cuing for set up with good carry over following  Doorway pec stretch 60sec hold 120d  Manual PROM all directions G1-2 post CHJ mob in 80-90d abd G3post GHJmobswithmovement intoflex/abd/ER/IR STM to bicep (severe pain to minimal palpation), UT, and pec minor.  Ptguarded throughoutall PROM especially with ER and abd     HEP update Access Code: [email or web address] URL: [email or web address] Date: 10/20/2020 Prepared by: Hilda Lias  Exercises Single Arm Scaption with Dumbbell - 1 x daily - 2-3 x weekly - 3 sets - 10 reps Prone Shoulder Row - 1 x daily - 2-3 x weekly - 3 sets - 10 reps Prone W Scapular Retraction - 1 x daily - 2-3 x weekly - 3 sets - 8 reps Low Trap Setting at Wall - 1 x daily - 2-3 x weekly - 3 sets - 10 reps Standing Shoulder Flexion Wall Walk - 2 x daily - 7 x weekly - 15 reps - 3-5 hold Standing Shoulder Abduction Finger Walk at Wall - 2 x daily - 7 x weekly - 15 reps - 3-5 hold Standing Shoulder Internal Rotation Stretch with Towel - 2 x daily - 7 x weekly - 15 reps - 3-5 hold Doorway Pec Stretch at 120 Degrees Abduction - 3 x daily - 7 x weekly - 30-60sec hold      PT Education - 10/20/20 0951    Education Details therex form/technique    Person(s) Educated Patient    Methods Explanation;Demonstration;Verbal cues    Comprehension Verbalized understanding;Returned  demonstration;Verbal cues required            PT Short Term Goals - 09/27/20 1037      PT SHORT TERM GOAL #1   Title Patient will demonstrate independence with HEP to be able to decrease pain and improve L shoulder function    Baseline 07/25/20 HEP given; 09/27/20 completing 50% of the time    Time 4    Period Weeks    Status On-going             PT Yon Term Goals -  09/27/20 1037      PT Lumad[place] TERM GOAL #1   Title Patient will decrease worst pain as reported on NPRS scale by 3 points to demonstrate a clinically significant reduction in pain.    Baseline 07/25/20 10/10 at worst; 09/27/20 4/10    Time 8    Period Weeks    Status Achieved      PT [place] TERM GOAL #2   Title Patient will increase L shoulder AROM to be [facility] in order to complete ADLs and household activities without pain or difficulty    Baseline 07/25/20 flexion 109, ER base of skull, IR S1, abd 45; 07/25/20 flexion 139, ER C7, IR L1, abd 110    Time 8    Period Weeks    Status Partially Met      PT [place] TERM GOAL #3   Title Patient will increase L shoulder MMT strength to 4/5 to be able to raise arm overhead to complete household chores and return to PLOF    Baseline 07/25/20 flexion, abd, ER, ext 2+ IR 3; 09/27/20 in availible range flex: 4+ abd: 4 ER: 4- IR: 4-    Time 8    Period Weeks    Status On-going      PT Iribe TERM GOAL #4   Title Patient will increase FOTO score to 64 to demonstrate predicted increase in functional mobility to complete ADLs    Baseline 07/25/20 47 09/27/20 63    Time [place] - 10/20/20 0954    Clinical Impression Statement PT continued therex progression for increased shoulder mobility, strength and scapulohumeral rhythm with success. Pt is able to comply with all cuing for therex technique following cuing with good motivation thorughout session. Patient very gaurded with pain to manual techniques especially with ER/IR movements. PT  updated HEP to reflect current status with patient able to demonstrate and verbalize understanding of all recommendations. PT will continue progression as able.    Personal Factors and Comorbidities Comorbidity 1;Past/Current Experience;Time since onset of injury/illness/exacerbation;Fitness    Comorbidities R breast cancer    Examination-Activity Limitations Bathing;Sleep;Lift;Dressing;Reach Overhead;Hygiene/Grooming    Examination-Participation Restrictions Cleaning;Laundry;Yard Work;Community Activity    Stability/Clinical Decision Making Evolving/Moderate complexity    Clinical Decision Making Moderate    Rehab Potential Good    PT Frequency 2x / week    PT Duration 8 weeks    PT Treatment/Interventions ADLs/Self Care Home Management;Electrical Stimulation;Moist Heat;Iontophoresis 4mg /ml Dexamethasone;Traction;Ultrasound;Functional mobility training;Therapeutic activities;Therapeutic exercise;Neuromuscular re-education;Patient/family education;Manual techniques;Passive range of motion;Dry needling;Scar mobilization;Joint Manipulations;Other (comment)    PT Next Visit Plan continue with mobility and single plane strength    PT Home Exercise Plan [email or web address]    Consulted and Agree with Plan of Care Patient           Patient will benefit from skilled therapeutic intervention in order to improve the following deficits and impairments:  Decreased mobility,Hypomobility,Improper body mechanics,Impaired tone,Decreased range of motion,Decreased strength,Increased fascial restricitons,Impaired flexibility,Impaired UE functional use,Postural dysfunction,Pain  Visit Diagnosis: Stiffness of left shoulder, not elsewhere classified  Chronic left shoulder pain     Problem List Patient Active Problem List   Diagnosis Date Noted  . Breast cancer ([place]) 03/21/2020  . Anemia due to antineoplastic chemotherapy 02/15/2020  . Cardiomyopathy ([place]) 12/12/2019  . Dehydration 11/24/2019  . Acute pain of  left shoulder 11/24/2019  . Orthostatic hypotension  10/12/2019  . Low serum cortisol level ([address]) 09/29/2019  . Malignant neoplasm of breast in female, estrogen receptor negative ([place]) 09/28/2019  . Hypokalemia 09/15/2019  . Hypocalcemia 09/15/2019  . Normocytic anemia 09/15/2019  . B12 deficiency 09/15/2019  . Iron deficiency anemia 09/15/2019  . Encounter for antineoplastic chemotherapy 08/25/2019  . Goals of care, counseling/discussion 08/17/2019  . Weight loss 07/31/2019  . Invasive carcinoma of breast ([place]) 07/30/2019  . H/O diarrhea 01/01/2017   Durwin Reges DPT Durwin Reges 10/20/2020, 10:31 AM  [facility] [address], [place], [address] Phone: [phone]   Fax:  [phone]  Name: ROBIN PETRAKIS MRN: [id number] Date of Birth: 1976/04/01

## 2020-10-25 ENCOUNTER — Other Ambulatory Visit: Payer: Self-pay

## 2020-10-25 ENCOUNTER — Encounter: Payer: Self-pay | Admitting: Physical Therapy

## 2020-10-25 ENCOUNTER — Ambulatory Visit: Payer: BC Managed Care – PPO | Admitting: Physical Therapy

## 2020-10-25 DIAGNOSIS — M25612 Stiffness of left shoulder, not elsewhere classified: Secondary | ICD-10-CM | POA: Diagnosis not present

## 2020-10-25 DIAGNOSIS — G8929 Other chronic pain: Secondary | ICD-10-CM

## 2020-10-25 NOTE — Therapy (Signed)
[facility] [address] [place], [address] Phone: [phone]   Fax:  [phone]  Physical Therapy Treatment  Patient Details  Name: Heather Bell MRN: [id number] Date of Birth: Mar 28, 1976 No data recorded  Encounter Date: 10/25/2020   PT End of Session - 10/25/20 0952    Visit Number 24    Number of Visits 33    Date for PT Re-Evaluation 11/25/20    Authorization - Visit Number 24    Authorization - Number of Visits 33    PT Start Time 0948    PT Stop Time 1026    PT Time Calculation (min) 38 min    Activity Tolerance Patient tolerated treatment well;Patient limited by pain    Behavior During Therapy [facility] for tasks assessed/performed           Past Medical History:  Diagnosis Date  . Allergic rhinitis   . Anxiety   . Bipolar disorder ([place])    depression  . Breast cancer ([place]) 03/21/2020   Right mastectomy  . Cancer [facility])    breast right  . Depression   . Diabetes ([place])   . GERD (gastroesophageal reflux disease)   . Hyperlipidemia   . Personal history of chemotherapy    Jan 2021 through July 2021    Past Surgical History:  Procedure Laterality Date  . BREAST BIOPSY Right 07/22/2019   mass bx, path pending, heart marker  . BREAST BIOPSY Right 07/22/2019   LN bx, path pending,  butterfly hydromarker  . BREAST RECONSTRUCTION WITH PLACEMENT OF TISSUE EXPANDER AND FLEX HD (ACELLULAR HYDRATED DERMIS) Right 03/21/2020   Procedure: RIGHT BREAST RECONSTRUCTION WITH PLACEMENT OF TISSUE EXPANDER AND FLEX HD (ACELLULAR HYDRATED DERMIS);  Surgeon: [name], MD;  Location: [facility];  Service: Plastics;  Laterality: Right;  . IMAGE GUIDED SINUS SURGERY    . INCISION AND DRAINAGE OF WOUND Right 05/02/2020   Procedure: IRRIGATION AND DEBRIDEMENT WOUND WITH REMOVAL OF RIGHT TISSUE EXPANDER;  Surgeon: [name], MD;  Location: [place];  Service: Plastics;  Laterality: Right;  1  hour please  . KNEE SURGERY    . MASTECTOMY Right 03/21/2020  . NASAL SEPTUM SURGERY    . PORTACATH PLACEMENT N/A 08/07/2019   Procedure: INSERTION PORT-A-CATH;  Surgeon: Herbert Pun, MD;  Location: [facility];  Service: General;  Laterality: N/A;  . TONSILLECTOMY    . TOTAL MASTECTOMY Right 03/21/2020   Procedure: TOTAL MASTECTOMY;  Surgeon: Herbert Pun, MD;  Location: [facility];  Service: General;  Laterality: Right;    There were no vitals filed for this visit.   Subjective Assessment - 10/25/20 0950    Subjective Reports her shoulder is doing well, she is having 2/10 pain today. She has been trying to do her new HEP, completing somewhat, has difficulty with the wall walks.    Pertinent History Patient is a 45 year old female with L shoulder pain that began after a L port-a-cath was inserted last December for chemotherapy treatments for R breast cancer. Pt has concluded chemo treatments, but per onocologist is to keep port for a "little while longer". Has had R masectomy and R expander placement and removal (no current plans for reconstruction). Pt reports a steady increase in pain and loss of motion over time since the port was inserted. Pt has received cortisone shots in L shoulder for bursitis last July and stated it helps decrease pain for some time  but did not last very [place]. Pt had an expander put in on R side post masectomy last year but the tissue died over it and is thinking about getting another one. Pt has difficulty and pain with all movements and states only rest and no movement makes her L shoulder feel better. Pt scales the pain a 10/10 at worst and 1/10 at best when not moving it. Pt is sleeping on her back but depending on if she rolls over, her L shoulder can be painful in the AM. Patient denies any N/V, B&B, unexplained weight loss, night pain, fever, or night sweats.    Limitations Lifting;House hold activities    How [place] can you sit comfortably? unlimited     How [place] can you stand comfortably? unlimited    How [place] can you walk comfortably? unlimited    Patient Stated Goals decrease pain, be able to move LUE to PLOF    Pain Onset More than a month ago             Ther-Ex TRX overhead flexionwalkx15  TRX overhead abd walk x15  Review of wall walks flex and abd with cuing to walk toward wall to make this more comfortable with good understandig  Y on wall using doorframe as patient reports she does not have a large enough wall space at home, good carry over and understanding  Seated military press 3#3x8DB bilat with cuing for maintained scapular retraction  Seated scaption 2#3x10 with min cuing for scapular retraction with good carry over  Prone row 2# 3x 10 with max cuing initially with good carry over  Prone W 2x 8 with elbow lift only cuing for scapular retraction with good carry over  Doorway pec stretch 60sec hold 120d  Manual PROM all directions G1-2 post CHJ mob in 80-90d abd G3post GHJmobswithmovement intoflex/abd/ER/IR STM to bicep (severe pain to minimal palpation), UT, and pec minor.  Ptguarded throughoutall PROM especially with ER and abd                         PT Education - 10/25/20 0952    Education Details therex form/technique    Person(s) Educated Patient    Methods Explanation;Demonstration;Verbal cues    Comprehension Verbalized understanding;Returned demonstration;Verbal cues required            PT Short Term Goals - 09/27/20 1037      PT SHORT TERM GOAL #1   Title Patient will demonstrate independence with HEP to be able to decrease pain and improve L shoulder function    Baseline 07/25/20 HEP given; 09/27/20 completing 50% of the time    Time 4    Period Weeks    Status On-going             PT [place] Term Goals - 09/27/20 1037      PT [place] TERM GOAL #1   Title Patient will decrease worst pain as reported on NPRS scale by 3 points to demonstrate a  clinically significant reduction in pain.    Baseline 07/25/20 10/10 at worst; 09/27/20 4/10    Time 8    Period Weeks    Status Achieved      PT [place] TERM GOAL #2   Title Patient will increase L shoulder AROM to be [facility] in order to complete ADLs and household activities without pain or difficulty    Baseline 07/25/20 flexion 109, ER base of skull, IR S1, abd 45; 07/25/20 flexion  139, ER C7, IR L1, abd 110    Time 8    Period Weeks    Status Partially Met      PT [place] TERM GOAL #3   Title Patient will increase L shoulder MMT strength to 4/5 to be able to raise arm overhead to complete household chores and return to PLOF    Baseline 07/25/20 flexion, abd, ER, ext 2+ IR 3; 09/27/20 in availible range flex: 4+ abd: 4 ER: 4- IR: 4-    Time 8    Period Weeks    Status On-going      PT [place] TERM GOAL #4   Title Patient will increase FOTO score to 64 to demonstrate predicted increase in functional mobility to complete ADLs    Baseline 07/25/20 47 09/27/20 63    Time 8    Period Weeks    Status New                 Plan - 10/25/20 1000    Clinical Impression Statement PT continued therex progression for increased shoulder mobility, and shoulder/periscapular strength with success. patietn is able to comply with all cuing for proper technique of therex with good motivation throughout session. PT reviewed HEP to increase compliance and undersatnding with success. Pt continues to demonstrate increased sensitivity with all manual techniques and minimal tolerance, needed lower mobilization grades and light STM initially before being able to complete higher grades for increased mobility. PT will continue progression as able.    Personal Factors and Comorbidities Comorbidity 1;Past/Current Experience;Time since onset of injury/illness/exacerbation;Fitness    Comorbidities R breast cancer    Examination-Activity Limitations Bathing;Sleep;Lift;Dressing;Reach Overhead;Hygiene/Grooming     Examination-Participation Restrictions Cleaning;Laundry;Yard Work;Community Activity    Stability/Clinical Decision Making Evolving/Moderate complexity    Clinical Decision Making Moderate    Rehab Potential Good    PT Frequency 2x / week    PT Duration 8 weeks    PT Treatment/Interventions ADLs/Self Care Home Management;Electrical Stimulation;Moist Heat;Iontophoresis 72m/ml Dexamethasone;Traction;Ultrasound;Functional mobility training;Therapeutic activities;Therapeutic exercise;Neuromuscular re-education;Patient/family education;Manual techniques;Passive range of motion;Dry needling;Scar mobilization;Joint Manipulations;Other (comment)    PT Next Visit Plan continue with mobility and single plane strength    PT Home Exercise Plan [email or web address]    Consulted and Agree with Plan of Care Patient           Patient will benefit from skilled therapeutic intervention in order to improve the following deficits and impairments:  Decreased mobility,Hypomobility,Improper body mechanics,Impaired tone,Decreased range of motion,Decreased strength,Increased fascial restricitons,Impaired flexibility,Impaired UE functional use,Postural dysfunction,Pain  Visit Diagnosis: Stiffness of left shoulder, not elsewhere classified  Chronic left shoulder pain     Problem List Patient Active Problem List   Diagnosis Date Noted  . Breast cancer ([place] 03/21/2020  . Anemia due to antineoplastic chemotherapy 02/15/2020  . Cardiomyopathy ([place] 12/12/2019  . Dehydration 11/24/2019  . Acute pain of left shoulder 11/24/2019  . Orthostatic hypotension 10/12/2019  . Low serum cortisol level ([place] 09/29/2019  . Malignant neoplasm of breast in female, estrogen receptor negative ([place] 09/28/2019  . Hypokalemia 09/15/2019  . Hypocalcemia 09/15/2019  . Normocytic anemia 09/15/2019  . B12 deficiency 09/15/2019  . Iron deficiency anemia 09/15/2019  . Encounter for antineoplastic chemotherapy 08/25/2019  . Goals of care,  counseling/discussion 08/17/2019  . Weight loss 07/31/2019  . Invasive carcinoma of breast ([place] 07/30/2019  . H/O diarrhea 01/01/2017   CDurwin RegesDPT CDurwin Reges3/03/2021, 10:45 AM  [facility] [address]  [place], [place], [address] Phone: [phone]   Fax:  [phone]  Name: Heather Bell MRN: [id number] Date of Birth: 10/30/1975

## 2020-10-27 ENCOUNTER — Ambulatory Visit: Payer: BC Managed Care – PPO | Admitting: Physical Therapy

## 2020-10-27 ENCOUNTER — Encounter: Payer: Self-pay | Admitting: Physical Therapy

## 2020-10-27 ENCOUNTER — Other Ambulatory Visit: Payer: Self-pay

## 2020-10-27 DIAGNOSIS — M25612 Stiffness of left shoulder, not elsewhere classified: Secondary | ICD-10-CM | POA: Diagnosis not present

## 2020-10-27 DIAGNOSIS — G8929 Other chronic pain: Secondary | ICD-10-CM

## 2020-10-27 NOTE — Therapy (Signed)
[facility] [address] [place], [address] Phone: [phone]   Fax:  [phone]  Physical Therapy Treatment  Patient Details  Name: Heather Bell MRN: [id number] Date of Birth: [birth year] No data recorded  Encounter Date: 10/27/2020   PT End of Session - 10/27/20 0952    Visit Number 25    Number of Visits 33    Date for PT Re-Evaluation 11/25/20    Authorization - Visit Number 25    Authorization - Number of Visits 33    PT Start Time 0947    PT Stop Time 1025    PT Time Calculation (min) 38 min    Activity Tolerance Patient tolerated treatment well;Patient limited by pain    Behavior During Therapy [facility] for tasks assessed/performed           Past Medical History:  Diagnosis Date  . Allergic rhinitis   . Anxiety   . Bipolar disorder ([place])    depression  . Breast cancer ([place]) 03/21/2020   Right mastectomy  . Cancer [facility])    breast right  . Depression   . Diabetes ([place])   . GERD (gastroesophageal reflux disease)   . Hyperlipidemia   . Personal history of chemotherapy    Jan 2021 through July 2021    Past Surgical History:  Procedure Laterality Date  . BREAST BIOPSY Right 07/22/2019   mass bx, path pending, heart marker  . BREAST BIOPSY Right 07/22/2019   LN bx, path pending,  butterfly hydromarker  . BREAST RECONSTRUCTION WITH PLACEMENT OF TISSUE EXPANDER AND FLEX HD (ACELLULAR HYDRATED DERMIS) Right 03/21/2020   Procedure: RIGHT BREAST RECONSTRUCTION WITH PLACEMENT OF TISSUE EXPANDER AND FLEX HD (ACELLULAR HYDRATED DERMIS);  Surgeon: [name], MD;  Location: [facility];  Service: Plastics;  Laterality: Right;  . IMAGE GUIDED SINUS SURGERY    . INCISION AND DRAINAGE OF WOUND Right 05/02/2020   Procedure: IRRIGATION AND DEBRIDEMENT WOUND WITH REMOVAL OF RIGHT TISSUE EXPANDER;  Surgeon: [name], MD;  Location: [place];  Service: Plastics;  Laterality: Right;  1  hour please  . KNEE SURGERY    . MASTECTOMY Right 03/21/2020  . NASAL SEPTUM SURGERY    . PORTACATH PLACEMENT N/A 08/07/2019   Procedure: INSERTION PORT-A-CATH;  Surgeon: Herbert Pun, MD;  Location: [facility];  Service: General;  Laterality: N/A;  . TONSILLECTOMY    . TOTAL MASTECTOMY Right 03/21/2020   Procedure: TOTAL MASTECTOMY;  Surgeon: Herbert Pun, MD;  Location: [facility];  Service: General;  Laterality: Right;    There were no vitals filed for this visit.   Subjective Assessment - 10/27/20 0950    Subjective Patient reports some increased pain today 3/10 following helping her husband around the house yesterday. She forgot her HEP yesterday. Feels like her motion is getting better    Pertinent History Patient is a 45 year old female with L shoulder pain that began after a L port-a-cath was inserted last December for chemotherapy treatments for R breast cancer. Pt has concluded chemo treatments, but per onocologist is to keep port for a "little while longer". Has had R masectomy and R expander placement and removal (no current plans for reconstruction). Pt reports a steady increase in pain and loss of motion over time since the port was inserted. Pt has received cortisone shots in L shoulder for bursitis last July and stated it helps decrease pain for some time but did  not last very [place]. Pt had an expander put in on R side post masectomy last year but the tissue died over it and is thinking about getting another one. Pt has difficulty and pain with all movements and states only rest and no movement makes her L shoulder feel better. Pt scales the pain a 10/10 at worst and 1/10 at best when not moving it. Pt is sleeping on her back but depending on if she rolls over, her L shoulder can be painful in the AM. Patient denies any N/V, B&B, unexplained weight loss, night pain, fever, or night sweats.    Limitations Lifting;House hold activities    How [place] can you sit comfortably?  unlimited    How [place] can you stand comfortably? unlimited    How [place] can you walk comfortably? unlimited    Patient Stated Goals decrease pain, be able to move LUE to PLOF    Pain Onset More than a month ago           Ther-Ex TRX overhead flexionwalkx15  TRX overhead abd walkx15  Lat pulldown 15# 3x 8 with cuing for scapular motion and eccentric   Seated military press 3# DB 2x 10; 4# x6 with cuing to prevent shoulder hiking with proper scapulohumeral rhythm with good carry over  Seatedscaption 3#2x 8; 1x 6 with min cuing forscapular retraction with good carry over  High rows GTB 3x 10/9/8 with good carry over following demo for technique  Prone W 2x 8 with elbow lift only cuing for scapular retraction with good carry over  Doorway pec stretch 60sec hold 120d  Manual PROM all directions G1-2 post CHJ mob in 80-90d abd G3post GHJmobswithmovement intoflex/abd/ER/IR STM to bicep (severe pain to minimal palpation), UT, and pec minor.  Ptguarded throughoutall PROM especially with ER and abd                           PT Education - 10/27/20 0951    Education Details therex form/technique    Person(s) Educated Patient    Methods Explanation;Demonstration;Verbal cues    Comprehension Verbalized understanding;Verbal cues required;Returned demonstration            PT Short Term Goals - 09/27/20 1037      PT SHORT TERM GOAL #1   Title Patient will demonstrate independence with HEP to be able to decrease pain and improve L shoulder function    Baseline 07/25/20 HEP given; 09/27/20 completing 50% of the time    Time 4    Period Weeks    Status On-going             PT [place] Term Goals - 09/27/20 1037      PT [place] TERM GOAL #1   Title Patient will decrease worst pain as reported on NPRS scale by 3 points to demonstrate a clinically significant reduction in pain.    Baseline 07/25/20 10/10 at worst; 09/27/20 4/10    Time 8     Period Weeks    Status Achieved      PT [place] TERM GOAL #2   Title Patient will increase L shoulder AROM to be [facility] in order to complete ADLs and household activities without pain or difficulty    Baseline 07/25/20 flexion 109, ER base of skull, IR S1, abd 45; 07/25/20 flexion 139, ER C7, IR L1, abd 110    Time 8    Period Weeks    Status Partially Met  PT [place] TERM GOAL #3   Title Patient will increase L shoulder MMT strength to 4/5 to be able to raise arm overhead to complete household chores and return to PLOF    Baseline 07/25/20 flexion, abd, ER, ext 2+ IR 3; 09/27/20 in availible range flex: 4+ abd: 4 ER: 4- IR: 4-    Time 8    Period Weeks    Status On-going      PT Flom TERM GOAL #4   Title Patient will increase FOTO score to 64 to demonstrate predicted increase in functional mobility to complete ADLs    Baseline 07/25/20 47 09/27/20 63    Time 8    Period Weeks    Status New                 Plan - 10/27/20 1054    Clinical Impression Statement PT continued therex progression for increased shoulder and scapular mobility and strength with success. Pain continues to limit manual progression for mobility. PT discussed reducing session frequency until pain somewhat subsides, as this pattern is somewhat time dependent with adhesive capsulitis. Patient notes that she is making progress which she wants to continue, but will consider frequency change to increase patient comfot. Patient is able to comply with all cuing for proper technique of therex with some pain throughout. PT will continue progression as able.    Personal Factors and Comorbidities Comorbidity 1;Past/Current Experience;Time since onset of injury/illness/exacerbation;Fitness    Comorbidities R breast cancer    Examination-Activity Limitations Bathing;Sleep;Lift;Dressing;Reach Overhead;Hygiene/Grooming    Examination-Participation Restrictions Cleaning;Laundry;Yard Work;Community Activity    Stability/Clinical  Decision Making Evolving/Moderate complexity    Clinical Decision Making Moderate    Rehab Potential Good    PT Frequency 2x / week    PT Duration 8 weeks    PT Treatment/Interventions ADLs/Self Care Home Management;Electrical Stimulation;Moist Heat;Iontophoresis 4mg /ml Dexamethasone;Traction;Ultrasound;Functional mobility training;Therapeutic activities;Therapeutic exercise;Neuromuscular re-education;Patient/family education;Manual techniques;Passive range of motion;Dry needling;Scar mobilization;Joint Manipulations;Other (comment)    PT Next Visit Plan continue with mobility and single plane strength    PT Home Exercise Plan [id number]    Consulted and Agree with Plan of Care Patient           Patient will benefit from skilled therapeutic intervention in order to improve the following deficits and impairments:  Decreased mobility,Hypomobility,Improper body mechanics,Impaired tone,Decreased range of motion,Decreased strength,Increased fascial restricitons,Impaired flexibility,Impaired UE functional use,Postural dysfunction,Pain  Visit Diagnosis: Stiffness of left shoulder, not elsewhere classified  Chronic left shoulder pain     Problem List Patient Active Problem List   Diagnosis Date Noted  . Breast cancer ([place]) 03/21/2020  . Anemia due to antineoplastic chemotherapy 02/15/2020  . Cardiomyopathy ([place]) 12/12/2019  . Dehydration 11/24/2019  . Acute pain of left shoulder 11/24/2019  . Orthostatic hypotension 10/12/2019  . Low serum cortisol level ([place]) 09/29/2019  . Malignant neoplasm of breast in female, estrogen receptor negative ([place]) 09/28/2019  . Hypokalemia 09/15/2019  . Hypocalcemia 09/15/2019  . Normocytic anemia 09/15/2019  . B12 deficiency 09/15/2019  . Iron deficiency anemia 09/15/2019  . Encounter for antineoplastic chemotherapy 08/25/2019  . Goals of care, counseling/discussion 08/17/2019  . Weight loss 07/31/2019  . Invasive carcinoma of breast ([place]) 07/30/2019   . H/O diarrhea 01/01/2017   Durwin Reges DPT Durwin Reges 10/27/2020, 11:23 AM  [facility] [address] [place], [address] Phone: [phone]   Fax:  [phone]  Name: ZUMA HUST MRN: [id number] Date of Birth:  05/19/1976   

## 2020-11-01 ENCOUNTER — Ambulatory Visit: Payer: BC Managed Care – PPO | Admitting: Physical Therapy

## 2020-11-01 ENCOUNTER — Other Ambulatory Visit: Payer: Self-pay

## 2020-11-01 ENCOUNTER — Encounter: Payer: Self-pay | Admitting: Physical Therapy

## 2020-11-01 DIAGNOSIS — M25612 Stiffness of left shoulder, not elsewhere classified: Secondary | ICD-10-CM

## 2020-11-01 DIAGNOSIS — M25512 Pain in left shoulder: Secondary | ICD-10-CM

## 2020-11-01 DIAGNOSIS — G8929 Other chronic pain: Secondary | ICD-10-CM

## 2020-11-01 NOTE — Therapy (Signed)
[facility] [address], [place], [address] Phone: [phone]   Fax:  [phone]  Physical Therapy Treatment  Patient Details  Name: Heather Bell MRN: [id number] Date of Birth: 1976/02/14 No data recorded  Encounter Date: 11/01/2020   PT End of Session - 11/01/20 1043    Visit Number 36    Number of Visits 33    Date for PT Re-Evaluation 11/25/20    Authorization - Visit Number 22    Authorization - Number of Visits 33    PT Start Time 3220    PT Stop Time 1114    PT Time Calculation (min) 39 min    Activity Tolerance Patient tolerated treatment well;Patient limited by pain    Behavior During Therapy [facility] for tasks assessed/performed           Past Medical History:  Diagnosis Date  . Allergic rhinitis   . Anxiety   . Bipolar disorder ([place])    depression  . Breast cancer ([place]) 03/21/2020   Right mastectomy  . Cancer [facility])    breast right  . Depression   . Diabetes ([place])   . GERD (gastroesophageal reflux disease)   . Hyperlipidemia   . Personal history of chemotherapy    Jan 2021 through July 2021    Past Surgical History:  Procedure Laterality Date  . BREAST BIOPSY Right 07/22/2019   mass bx, path pending, heart marker  . BREAST BIOPSY Right 07/22/2019   LN bx, path pending,  butterfly hydromarker  . BREAST RECONSTRUCTION WITH PLACEMENT OF TISSUE EXPANDER AND FLEX HD (ACELLULAR HYDRATED DERMIS) Right 03/21/2020   Procedure: RIGHT BREAST RECONSTRUCTION WITH PLACEMENT OF TISSUE EXPANDER AND FLEX HD (ACELLULAR HYDRATED DERMIS);  Surgeon: [name], MD;  Location: [facility];  Service: Plastics;  Laterality: Right;  . IMAGE GUIDED SINUS SURGERY    . INCISION AND DRAINAGE OF WOUND Right 05/02/2020   Procedure: IRRIGATION AND DEBRIDEMENT WOUND WITH REMOVAL OF RIGHT TISSUE EXPANDER;  Surgeon: [name], MD;  Location: [place];  Service: Plastics;  Laterality: Right;  1  hour please  . KNEE SURGERY    . MASTECTOMY Right 03/21/2020  . NASAL SEPTUM SURGERY    . PORTACATH PLACEMENT N/A 08/07/2019   Procedure: INSERTION PORT-A-CATH;  Surgeon: Herbert Pun, MD;  Location: [facility];  Service: General;  Laterality: N/A;  . TONSILLECTOMY    . TOTAL MASTECTOMY Right 03/21/2020   Procedure: TOTAL MASTECTOMY;  Surgeon: Herbert Pun, MD;  Location: [facility];  Service: General;  Laterality: Right;    There were no vitals filed for this visit.   Subjective Assessment - 11/01/20 1041    Subjective Patient reports she thinks her increased pain last session was d/t increase in resistance, reporting soreness in bicep that persisted for a couple days. Reports minimal pain on arrival.    Pertinent History Patient is a 45 year old female with L shoulder pain that began after a L port-a-cath was inserted last December for chemotherapy treatments for R breast cancer. Pt has concluded chemo treatments, but per onocologist is to keep port for a "little while longer". Has had R masectomy and R expander placement and removal (no current plans for reconstruction). Pt reports a steady increase in pain and loss of motion over time since the port was inserted. Pt has received cortisone shots in L shoulder for bursitis last July and stated it helps decrease pain for some time  but did not last very [place]. Pt had an expander put in on R side post masectomy last year but the tissue died over it and is thinking about getting another one. Pt has difficulty and pain with all movements and states only rest and no movement makes her L shoulder feel better. Pt scales the pain a 10/10 at worst and 1/10 at best when not moving it. Pt is sleeping on her back but depending on if she rolls over, her L shoulder can be painful in the AM. Patient denies any N/V, B&B, unexplained weight loss, night pain, fever, or night sweats.    Limitations Lifting;House hold activities    How [place] can you sit  comfortably? unlimited    How [place] can you walk comfortably? unlimited    Patient Stated Goals decrease pain, be able to move LUE to PLOF            Ther-Ex Pulleys flex x12  TRX overhead flexionwalkx15  TRX overhead abd walkx15  Lat pulldown 10# 3x 10 with cuing for scapular motion and eccentric   Seated military press3# DB 3x 10 with good carry over of proper technique  Y on wall x12; with 1# DB 2x 10 with min cuing for elbow ext with good carry over  Seatedscaption 3#3x 8 with min cuing forscapular retraction with good carry over  Doorway pec/bicep stretch 60sec   Manual PROM all directions G1-2 post CHJ mob in 80-90d abd G3post GHJmobswithmovement intoflex/abd/ER/IR STM to bicep (severe pain to minimal palpation), UT, and pec minor.  Ptguarded throughoutall PROM especially with ER and abd                           PT Education - 11/01/20 1042    Education Details therex form/technique    Person(s) Educated Patient    Methods Explanation;Demonstration;Verbal cues    Comprehension Verbalized understanding;Returned demonstration;Verbal cues required            PT Short Term Goals - 09/27/20 1037      PT SHORT TERM GOAL #1   Title Patient will demonstrate independence with HEP to be able to decrease pain and improve L shoulder function    Baseline 07/25/20 HEP given; 09/27/20 completing 50% of the time    Time 4    Period Weeks    Status On-going             PT [place] Term Goals - 09/27/20 1037      PT [place] TERM GOAL #1   Title Patient will decrease worst pain as reported on NPRS scale by 3 points to demonstrate a clinically significant reduction in pain.    Baseline 07/25/20 10/10 at worst; 09/27/20 4/10    Time 8    Period Weeks    Status Achieved      PT [place] TERM GOAL #2   Title Patient will increase L shoulder AROM to be [facility] in order to complete ADLs and household activities without pain or difficulty     Baseline 07/25/20 flexion 109, ER base of skull, IR S1, abd 45; 07/25/20 flexion 139, ER C7, IR L1, abd 110    Time 8    Period Weeks    Status Partially Met      PT [place] TERM GOAL #3   Title Patient will increase L shoulder MMT strength to 4/5 to be able to raise arm overhead to complete household chores and return to PLOF  Baseline 07/25/20 flexion, abd, ER, ext 2+ IR 3; 09/27/20 in availible range flex: 4+ abd: 4 ER: 4- IR: 4-    Time 8    Period Weeks    Status On-going      PT [place] TERM GOAL #4   Title Patient will increase FOTO score to 64 to demonstrate predicted increase in functional mobility to complete ADLs    Baseline 07/25/20 47 09/27/20 63    Time 8    Period Weeks    Status New                 Plan - 11/01/20 1045    Clinical Impression Statement PT continued therex progression for increased shoulder mobility, and scapulohumeral rhythm with success. PT continued to utilize manual techniques as well, with noted tension in bicep and pec minor; educated patient on preventing gaurding with elbow flex with understanding. Patient able to complete all therex with proper technique with minimal cuing, good motivation throughout session. PT will continue progression as able.    Personal Factors and Comorbidities Comorbidity 1;Past/Current Experience;Time since onset of injury/illness/exacerbation;Fitness    Comorbidities R breast cancer    Examination-Activity Limitations Bathing;Sleep;Lift;Dressing;Reach Overhead;Hygiene/Grooming    Examination-Participation Restrictions Cleaning;Laundry;Yard Work;Community Activity    Stability/Clinical Decision Making Evolving/Moderate complexity    Clinical Decision Making Moderate    Rehab Potential Good    PT Frequency 2x / week    PT Duration 8 weeks    PT Treatment/Interventions ADLs/Self Care Home Management;Electrical Stimulation;Moist Heat;Iontophoresis 55m/ml Dexamethasone;Traction;Ultrasound;Functional mobility training;Therapeutic  activities;Therapeutic exercise;Neuromuscular re-education;Patient/family education;Manual techniques;Passive range of motion;Dry needling;Scar mobilization;Joint Manipulations;Other (comment)    PT Next Visit Plan continue with mobility and single plane strength    PT Home Exercise Plan [id number]    Consulted and Agree with Plan of Care Patient           Patient will benefit from skilled therapeutic intervention in order to improve the following deficits and impairments:  Decreased mobility,Hypomobility,Improper body mechanics,Impaired tone,Decreased range of motion,Decreased strength,Increased fascial restricitons,Impaired flexibility,Impaired UE functional use,Postural dysfunction,Pain  Visit Diagnosis: No diagnosis found.     Problem List Patient Active Problem List   Diagnosis Date Noted  . Breast cancer ([place] 03/21/2020  . Anemia due to antineoplastic chemotherapy 02/15/2020  . Cardiomyopathy ([place] 12/12/2019  . Dehydration 11/24/2019  . Acute pain of left shoulder 11/24/2019  . Orthostatic hypotension 10/12/2019  . [place] serum cortisol level ([place] 09/29/2019  . Malignant neoplasm of breast in female, estrogen receptor negative ([place] 09/28/2019  . Hypokalemia 09/15/2019  . Hypocalcemia 09/15/2019  . Normocytic anemia 09/15/2019  . B12 deficiency 09/15/2019  . Iron deficiency anemia 09/15/2019  . Encounter for antineoplastic chemotherapy 08/25/2019  . Goals of care, counseling/discussion 08/17/2019  . Weight loss 07/31/2019  . Invasive carcinoma of breast ([place] 07/30/2019  . H/O diarrhea 01/01/2017   CDurwin RegesDPT CDurwin Reges3/15/2022, 11:16 AM  [facility] [address] [place] [id number]: [phone]  Fax:  [phone] Name: Heather MURALLESMRN: [id number] of Birth: [birth year]

## 2020-11-02 ENCOUNTER — Encounter: Payer: Self-pay | Admitting: Plastic Surgery

## 2020-11-02 ENCOUNTER — Ambulatory Visit (INDEPENDENT_AMBULATORY_CARE_PROVIDER_SITE_OTHER): Payer: BC Managed Care – PPO | Admitting: Plastic Surgery

## 2020-11-02 VITALS — BP 101/68 | HR 98

## 2020-11-02 DIAGNOSIS — C773 Secondary and unspecified malignant neoplasm of axilla and upper limb lymph nodes: Secondary | ICD-10-CM

## 2020-11-02 DIAGNOSIS — C50911 Malignant neoplasm of unspecified site of right female breast: Secondary | ICD-10-CM | POA: Diagnosis not present

## 2020-11-02 NOTE — Progress Notes (Signed)
Referring Provider Center, [address] [place],  [place] [address]   CC:  Chief Complaint  Patient presents with  . Follow-up      Heather Bell is an 45 y.o. female.  HPI: Patient presents to discuss breast reconstruction.  She had previously had a right sided mastectomy.  Unfortunately there was some mastectomy flap necrosis afterwards that required removal of the tissue expander.  She has since healed and it has been about 6 months since then.  She is interested in another attempt at reconstruction.  Allergies  Allergen Reactions  . Compazine [Prochlorperazine Edisylate] Other (See Comments)    Acute dystonic reaction  . Morphine And Related Shortness Of Breath  . Doxycycline Other (See Comments)    "made all my symptoms worse"   . Fluticasone Other (See Comments)    Doesn't work  . Levofloxacin Other (See Comments)    "made all my symptoms worse"     Outpatient Encounter Medications as of 11/02/2020  Medication Sig Note  . amphetamine-dextroamphetamine (ADDERALL XR) 25 MG 24 hr capsule Take 25 mg by mouth daily.   Marland Kitchen buPROPion (WELLBUTRIN SR) 150 MG 12 hr tablet Take 150 mg by mouth 2 (two) times daily.   . Calcium Carb-Cholecalciferol (CALCIUM 600+D) 600-800 MG-UNIT TABS Take 1 tablet by mouth daily.   . cetirizine (ZYRTEC) 10 MG tablet Take 10 mg by mouth daily.    Marland Kitchen doxepin (SINEQUAN) 25 MG capsule Take 25-50 mg by mouth at bedtime.    Marland Kitchen ibuprofen (ADVIL) 200 MG tablet Take 400-800 mg by mouth every 6 (six) hours as needed for headache or moderate pain.   Marland Kitchen lamoTRIgine (LAMICTAL) 150 MG tablet Take 150 mg by mouth daily.   Marland Kitchen LORazepam (ATIVAN) 0.5 MG tablet Take 0.5 mg by mouth every 8 (eight) hours as needed (nausea).   . lurasidone (LATUDA) 40 MG TABS tablet Take 40 mg by mouth at bedtime.  03/04/2020: 40 mg + 60 mg=100 mg dosage at night  . Lurasidone HCl 60 MG TABS Take 60 mg by mouth at bedtime.  03/04/2020: 40 mg + 60  mg=100 mg dosage at night  . metFORMIN (GLUCOPHAGE-XR) 500 MG 24 hr tablet Take 500 mg by mouth 2 (two) times daily.   . mometasone (NASONEX) 50 MCG/ACT nasal spray Place 2 sprays into the nose daily as needed (allergies).    . Multiple Vitamin (MULTIVITAMIN WITH MINERALS) TABS tablet Take 1 tablet by mouth daily.   Marland Kitchen omeprazole (PRILOSEC) 20 MG capsule Take 20 mg by mouth daily before breakfast.    . QUEtiapine (SEROQUEL) 300 MG tablet Take 300 mg by mouth at bedtime.   . simvastatin (ZOCOR) 20 MG tablet Take 20 mg by mouth daily.   Marland Kitchen topiramate (TOPAMAX) 100 MG tablet Take 50-100 mg by mouth See admin instructions. Take 1 tablet (100 mg) by mouth in the morning & take 0.5 tablet (50 mg) by mouth at night.   . vitamin B-12 (CYANOCOBALAMIN) 1000 MCG tablet Take 1,000 mcg by mouth daily.   . diazepam (VALIUM) 10 MG tablet Take 10 mg by mouth 2 (two) times daily.   Marland Kitchen HYDROcodone-acetaminophen (NORCO/VICODIN) 5-325 MG tablet as needed.   . lidocaine-prilocaine (EMLA) cream Apply topically over port-a-cath 30 minutes to 1 hour prior to treatment.   Marland Kitchen oxyCODONE-acetaminophen (PERCOCET) 7.5-325 MG tablet Take by mouth as needed.   . [DISCONTINUED] lamoTRIgine (LAMICTAL) 200 MG tablet Take 150 mg by mouth at bedtime.  Facility-Administered Encounter Medications as of 11/02/2020  Medication  . 0.9 %  sodium chloride infusion  . sodium chloride flush (NS) 0.9 % injection 10 mL     Past Medical History:  Diagnosis Date  . Allergic rhinitis   . Anxiety   . Bipolar disorder ([place])    depression  . Breast cancer ([place]) 03/21/2020   Right mastectomy  . Cancer [facility])    breast right  . Depression   . Diabetes ([place])   . GERD (gastroesophageal reflux disease)   . Hyperlipidemia   . Personal history of chemotherapy    Jan 2021 through July 2021    Past Surgical History:  Procedure Laterality Date  . BREAST BIOPSY Right 07/22/2019   mass bx, path pending, heart marker  . BREAST BIOPSY Right  07/22/2019   LN bx, path pending,  butterfly hydromarker  . BREAST RECONSTRUCTION WITH PLACEMENT OF TISSUE EXPANDER AND FLEX HD (ACELLULAR HYDRATED DERMIS) Right 03/21/2020   Procedure: RIGHT BREAST RECONSTRUCTION WITH PLACEMENT OF TISSUE EXPANDER AND FLEX HD (ACELLULAR HYDRATED DERMIS);  Surgeon: [name], MD;  Location: [facility];  Service: Plastics;  Laterality: Right;  . IMAGE GUIDED SINUS SURGERY    . INCISION AND DRAINAGE OF WOUND Right 05/02/2020   Procedure: IRRIGATION AND DEBRIDEMENT WOUND WITH REMOVAL OF RIGHT TISSUE EXPANDER;  Surgeon: [name], MD;  Location: [place];  Service: Plastics;  Laterality: Right;  1 hour please  . KNEE SURGERY    . MASTECTOMY Right 03/21/2020  . NASAL SEPTUM SURGERY    . PORTACATH PLACEMENT N/A 08/07/2019   Procedure: INSERTION PORT-A-CATH;  Surgeon: Herbert Pun, MD;  Location: [facility];  Service: General;  Laterality: N/A;  . TONSILLECTOMY    . TOTAL MASTECTOMY Right 03/21/2020   Procedure: TOTAL MASTECTOMY;  Surgeon: Herbert Pun, MD;  Location: [facility];  Service: General;  Laterality: Right;    Family History  Problem Relation Age of Onset  . Lung cancer Father   . Heart attack Father   . Lung cancer Paternal Uncle   . Heart disease Paternal Uncle   . Lung cancer Paternal Aunt   . Breast cancer Neg Hx     Social History   Social History Narrative  . Not on file     Review of Systems General: Denies fevers, chills, weight loss CV: Denies chest pain, shortness of breath, palpitations  Physical Exam Vitals with BMI 11/02/2020 09/20/2020 06/28/2020  Height - - 5\' 6"   Weight - 144 lbs 12 oz 141 lbs 9 oz  BMI - - 34.74  Systolic 259 563 99  Diastolic 68 68 68  Pulse 98 120 99    General:  No acute distress,  Alert and oriented, Non-Toxic, Normal speech and affect Exam shows a nicely healed incision on the right side.  Her skin is soft and mobile with minimal tethering.  Her base width is  about 10-1/2 cm.  Assessment/Plan Had a [place] discussion with Liannah about her options for breast reconstruction.  Given that she is healed nicely and there looks to be some mobility to the skin I have recommended replacement of the tissue expander.  We did discuss briefly and the potential of adding soft tissue with a latissimus flap however she is not keen on the idea of transferring the muscle from her back.  We discussed the risks of the procedure that include bleeding, infection, damage to surrounding structures and need for additional procedures.  All of her questions  were answered and we will plan to move forward.  We discussed the details of the expansion process and the potential modifications that can be done to the left side for symmetry down the line that include augmentation, mastopexy and fat grafting.  She is well-informed and interested in moving forward.  [name] 11/02/2020, 10:46 AM

## 2020-11-03 ENCOUNTER — Encounter: Payer: Self-pay | Admitting: Physical Therapy

## 2020-11-03 ENCOUNTER — Other Ambulatory Visit: Payer: Self-pay

## 2020-11-03 ENCOUNTER — Ambulatory Visit: Payer: BC Managed Care – PPO | Admitting: Physical Therapy

## 2020-11-03 DIAGNOSIS — M25612 Stiffness of left shoulder, not elsewhere classified: Secondary | ICD-10-CM | POA: Diagnosis not present

## 2020-11-03 DIAGNOSIS — M25512 Pain in left shoulder: Secondary | ICD-10-CM

## 2020-11-03 DIAGNOSIS — G8929 Other chronic pain: Secondary | ICD-10-CM

## 2020-11-03 NOTE — Therapy (Signed)
[facility] [address] [place], [address] Phone: [phone]   Fax:  [phone]  Physical Therapy Treatment  Patient Details  Name: Heather Bell MRN: [id number] Date of Birth: July 18, 1976 No data recorded  Encounter Date: 11/03/2020   PT End of Session - 11/03/20 0952    Visit Number 37    Number of Visits 33    Date for PT Re-Evaluation 11/25/20    Authorization - Visit Number 63    Authorization - Number of Visits 30    PT Start Time 0945    PT Stop Time 1025    PT Time Calculation (min) 40 min    Activity Tolerance Patient tolerated treatment well;Patient limited by pain    Behavior During Therapy [facility] for tasks assessed/performed           Past Medical History:  Diagnosis Date  . Allergic rhinitis   . Anxiety   . Bipolar disorder ([place])    depression  . Breast cancer ([place]) 03/21/2020   Right mastectomy  . Cancer [facility])    breast right  . Depression   . Diabetes ([place])   . GERD (gastroesophageal reflux disease)   . Hyperlipidemia   . Personal history of chemotherapy    Jan 2021 through July 2021    Past Surgical History:  Procedure Laterality Date  . BREAST BIOPSY Right 07/22/2019   mass bx, path pending, heart marker  . BREAST BIOPSY Right 07/22/2019   LN bx, path pending,  butterfly hydromarker  . BREAST RECONSTRUCTION WITH PLACEMENT OF TISSUE EXPANDER AND FLEX HD (ACELLULAR HYDRATED DERMIS) Right 03/21/2020   Procedure: RIGHT BREAST RECONSTRUCTION WITH PLACEMENT OF TISSUE EXPANDER AND FLEX HD (ACELLULAR HYDRATED DERMIS);  Surgeon: [name], MD;  Location: [facility];  Service: Plastics;  Laterality: Right;  . IMAGE GUIDED SINUS SURGERY    . INCISION AND DRAINAGE OF WOUND Right 05/02/2020   Procedure: IRRIGATION AND DEBRIDEMENT WOUND WITH REMOVAL OF RIGHT TISSUE EXPANDER;  Surgeon: [name], MD;  Location: [place];  Service: Plastics;  Laterality: Right;  1  hour please  . KNEE SURGERY    . MASTECTOMY Right 03/21/2020  . NASAL SEPTUM SURGERY    . PORTACATH PLACEMENT N/A 08/07/2019   Procedure: INSERTION PORT-A-CATH;  Surgeon: Herbert Pun, MD;  Location: [facility];  Service: General;  Laterality: N/A;  . TONSILLECTOMY    . TOTAL MASTECTOMY Right 03/21/2020   Procedure: TOTAL MASTECTOMY;  Surgeon: Herbert Pun, MD;  Location: [facility];  Service: General;  Laterality: Right;    There were no vitals filed for this visit.   Subjective Assessment - 11/03/20 0950    Subjective Patient reports 2/10 pain today. Reports some compliance with HEP.    Pertinent History Patient is a 45 year old female with L shoulder pain that began after a L port-a-cath was inserted last December for chemotherapy treatments for R breast cancer. Pt has concluded chemo treatments, but per onocologist is to keep port for a "little while longer". Has had R masectomy and R expander placement and removal (no current plans for reconstruction). Pt reports a steady increase in pain and loss of motion over time since the port was inserted. Pt has received cortisone shots in L shoulder for bursitis last July and stated it helps decrease pain for some time but did not last very [place]. Pt had an expander put in on R side post masectomy last year  but the tissue died over it and is thinking about getting another one. Pt has difficulty and pain with all movements and states only rest and no movement makes her L shoulder feel better. Pt scales the pain a 10/10 at worst and 1/10 at best when not moving it. Pt is sleeping on her back but depending on if she rolls over, her L shoulder can be painful in the AM. Patient denies any N/V, B&B, unexplained weight loss, night pain, fever, or night sweats.    Limitations Lifting;House hold activities    How [place] can you sit comfortably? unlimited    How [place] can you stand comfortably? unlimited    How [place] can you walk comfortably? unlimited     Patient Stated Goals decrease pain, be able to move LUE to PLOF    Pain Onset More than a month ago             Ther-Ex Pulleys flex x12  TRX overhead flexionwalkx15  TRX overhead abd walkx15  Lat pulldown 10# 3x 10 with cuing for scapular motion and eccentric  Standing scaption GTB 3x 10 with standing with back against wall for TC of scapular retraction with good carry over (approx 150d of scaption)  Seated military press 2# x8; 3# DB 2x 10 with good carry over of proper technique  Doorway pec/bicep stretch 60sec   Manual PROM all directions G1-2 post CHJ mob in 80-90d abd G3post GHJmobswithmovement intoflex/abd/ER/IR STM to bicep (severe pain to minimal palpation), UT, and pec minor.  Ptguarded throughoutall PROM especially with ER and abd                          PT Education - 11/03/20 0951    Education Details therex form/technique    Person(s) Educated Patient    Methods Explanation;Demonstration;Verbal cues    Comprehension Verbalized understanding;Returned demonstration;Verbal cues required            PT Short Term Goals - 09/27/20 1037      PT SHORT TERM GOAL #1   Title Patient will demonstrate independence with HEP to be able to decrease pain and improve L shoulder function    Baseline 07/25/20 HEP given; 09/27/20 completing 50% of the time    Time 4    Period Weeks    Status On-going             PT [place] Term Goals - 09/27/20 1037      PT [place] TERM GOAL #1   Title Patient will decrease worst pain as reported on NPRS scale by 3 points to demonstrate a clinically significant reduction in pain.    Baseline 07/25/20 10/10 at worst; 09/27/20 4/10    Time 8    Period Weeks    Status Achieved      PT Forsee TERM GOAL #2   Title Patient will increase L shoulder AROM to be [facility] in order to complete ADLs and household activities without pain or difficulty    Baseline 07/25/20 flexion 109, ER base of skull, IR S1,  abd 45; 07/25/20 flexion 139, ER C7, IR L1, abd 110    Time 8    Period Weeks    Status Partially Met      PT [place]ulyas TERM GOAL #3   Title Patient will increase L shoulder MMT strength to 4/5 to be able to raise arm overhead to complete household chores and return to PLOF    Baseline 07/25/20  flexion, abd, ER, ext 2+ IR 3; 09/27/20 in availible range flex: 4+ abd: 4 ER: 4- IR: 4-    Time 8    Period Weeks    Status On-going      PT Papp TERM GOAL #4   Title Patient will increase FOTO score to 64 to demonstrate predicted increase in functional mobility to complete ADLs    Baseline 07/25/20 47 09/27/20 63    Time 8    Period Weeks    Status New                 Plan - 11/03/20 1004    Clinical Impression Statement PT continued therex progression for increased    Personal Factors and Comorbidities Comorbidity 1;Past/Current Experience;Time since onset of injury/illness/exacerbation;Fitness    Comorbidities R breast cancer    Examination-Activity Limitations Bathing;Sleep;Lift;Dressing;Reach Overhead;Hygiene/Grooming    Examination-Participation Restrictions Cleaning;Laundry;Yard Work;Community Activity    Stability/Clinical Decision Making Evolving/Moderate complexity    Clinical Decision Making Moderate    Rehab Potential Good    PT Frequency 2x / week    PT Duration 8 weeks    PT Treatment/Interventions ADLs/Self Care Home Management;Electrical Stimulation;Moist Heat;Iontophoresis 76m/ml Dexamethasone;Traction;Ultrasound;Functional mobility training;Therapeutic activities;Therapeutic exercise;Neuromuscular re-education;Patient/family education;Manual techniques;Passive range of motion;Dry needling;Scar mobilization;Joint Manipulations;Other (comment)    PT Next Visit Plan continue with mobility and single plane strength    PT Home Exercise Plan [id number]    Consulted and Agree with Plan of Care Patient           Patient will benefit from skilled therapeutic intervention in order to  improve the following deficits and impairments:  Decreased mobility,Hypomobility,Improper body mechanics,Impaired tone,Decreased range of motion,Decreased strength,Increased fascial restricitons,Impaired flexibility,Impaired UE functional use,Postural dysfunction,Pain  Visit Diagnosis: Stiffness of left shoulder, not elsewhere classified  Chronic left shoulder pain     Problem List Patient Active Problem List   Diagnosis Date Noted  . Breast cancer ([place] 03/21/2020  . Anemia due to antineoplastic chemotherapy 02/15/2020  . Cardiomyopathy ([place] 12/12/2019  . Dehydration 11/24/2019  . Acute pain of left shoulder 11/24/2019  . Orthostatic hypotension 10/12/2019  . Low serum cortisol level ([place] 09/29/2019  . Malignant neoplasm of breast in female, estrogen receptor negative ([place] 09/28/2019  . Hypokalemia 09/15/2019  . Hypocalcemia 09/15/2019  . Normocytic anemia 09/15/2019  . B12 deficiency 09/15/2019  . Iron deficiency anemia 09/15/2019  . Encounter for antineoplastic chemotherapy 08/25/2019  . Goals of care, counseling/discussion 08/17/2019  . Weight loss 07/31/2019  . Invasive carcinoma of breast ([place] 07/30/2019  . H/O diarrhea 01/01/2017   CDurwin RegesDPT  CDurwin Reges3/17/2022, 11:03 AM  [facility] [address] [place] [address]Phone: [phone]  Fax:  [phone] Name: Heather REMLINGERMRN: [id number] of Birth: 520-Jun-1977

## 2020-11-08 ENCOUNTER — Encounter: Payer: Self-pay | Admitting: Physical Therapy

## 2020-11-08 ENCOUNTER — Other Ambulatory Visit: Payer: Self-pay

## 2020-11-08 ENCOUNTER — Ambulatory Visit: Payer: BC Managed Care – PPO | Admitting: Physical Therapy

## 2020-11-08 DIAGNOSIS — M25512 Pain in left shoulder: Secondary | ICD-10-CM

## 2020-11-08 DIAGNOSIS — M25612 Stiffness of left shoulder, not elsewhere classified: Secondary | ICD-10-CM

## 2020-11-08 DIAGNOSIS — G8929 Other chronic pain: Secondary | ICD-10-CM

## 2020-11-08 NOTE — Therapy (Signed)
[place] [facility] [address] [place], [address] Phone: [phone]   Fax:  [phone]  Physical Therapy Treatment  Patient Details  Name: Heather Bell MRN: [id number] Date of Birth: 07-06-1976 No data recorded  Encounter Date: 11/08/2020   PT End of Session - 11/08/20 0958    Visit Number 38    Number of Visits 50    Date for PT Re-Evaluation 11/25/20    Authorization - Visit Number 21    Authorization - Number of Visits 25    PT Start Time 0945    PT Stop Time 1025    PT Time Calculation (min) 40 min    Activity Tolerance Patient tolerated treatment well;Patient limited by pain    Behavior During Therapy [facility] for tasks assessed/performed           Past Medical History:  Diagnosis Date  . Allergic rhinitis   . Anxiety   . Bipolar disorder ([place])    depression  . Breast cancer ([place]) 03/21/2020   Right mastectomy  . Cancer [facility])    breast right  . Depression   . Diabetes ([place])   . GERD (gastroesophageal reflux disease)   . Hyperlipidemia   . Personal history of chemotherapy    Jan 2021 through July 2021    Past Surgical History:  Procedure Laterality Date  . BREAST BIOPSY Right 07/22/2019   mass bx, path pending, heart marker  . BREAST BIOPSY Right 07/22/2019   LN bx, path pending,  butterfly hydromarker  . BREAST RECONSTRUCTION WITH PLACEMENT OF TISSUE EXPANDER AND FLEX HD (ACELLULAR HYDRATED DERMIS) Right 03/21/2020   Procedure: RIGHT BREAST RECONSTRUCTION WITH PLACEMENT OF TISSUE EXPANDER AND FLEX HD (ACELLULAR HYDRATED DERMIS);  Surgeon: [name], MD;  Location: [facility];  Service: Plastics;  Laterality: Right;  . IMAGE GUIDED SINUS SURGERY    . INCISION AND DRAINAGE OF WOUND Right 05/02/2020   Procedure: IRRIGATION AND DEBRIDEMENT WOUND WITH REMOVAL OF RIGHT TISSUE EXPANDER;  Surgeon: [name], MD;  Location: [facility];  Service: Plastics;  Laterality: Right;  1  hour please  . KNEE SURGERY    . MASTECTOMY Right 03/21/2020  . NASAL SEPTUM SURGERY    . PORTACATH PLACEMENT N/A 08/07/2019   Procedure: INSERTION PORT-A-CATH;  Surgeon: Herbert Pun, MD;  Location: [facility];  Service: General;  Laterality: N/A;  . TONSILLECTOMY    . TOTAL MASTECTOMY Right 03/21/2020   Procedure: TOTAL MASTECTOMY;  Surgeon: Herbert Pun, MD;  Location: [facility];  Service: General;  Laterality: Right;    There were no vitals filed for this visit.   Subjective Assessment - 11/08/20 0952    Subjective Patient reports 2/10 pain today. Got in touch with her insurance company where she realizes she has 25 visits/calender year    Pertinent History Patient is a 45 year old female with L shoulder pain that began after a L port-a-cath was inserted last December for chemotherapy treatments for R breast cancer. Pt has concluded chemo treatments, but per onocologist is to keep port for a "little while longer". Has had R masectomy and R expander placement and removal (no current plans for reconstruction). Pt reports a steady increase in pain and loss of motion over time since the port was inserted. Pt has received cortisone shots in L shoulder for bursitis last July and stated it helps decrease pain for some time but did not last very [place]. Pt had an  expander put in on R side post masectomy last year but the tissue died over it and is thinking about getting another one. Pt has difficulty and pain with all movements and states only rest and no movement makes her L shoulder feel better. Pt scales the pain a 10/10 at worst and 1/10 at best when not moving it. Pt is sleeping on her back but depending on if she rolls over, her L shoulder can be painful in the AM. Patient denies any N/V, B&B, unexplained weight loss, night pain, fever, or night sweats.    Limitations Lifting;House hold activities    How [place] can you sit comfortably? unlimited    How [place] can you stand comfortably?  unlimited    How [place] can you walk comfortably? unlimited    Patient Stated Goals decrease pain, be able to move LUE to PLOF    Pain Onset More than a month ago             Ther-Ex TRX overhead flexionwalkx15  TRX overhead abd walkx15  Squat with overhead press 10# x6; 15# 2x 5  OMEGA chest press/slight overhead 10# x6 12# x6; 15# x5 with min assistance for concentric, none with eccentric  Lat pulldown 15# 3x 6with cuing for scapular motion and eccentric  Standing scaption GTB 3x 10 attempted bluTB unable at this time  PT reviewed the following HEP with patient with patient able to demonstrate a set of the strengthening therex, verbalized understanding of mobility therex with min cuing for correction needed. PT educated patient on parameters of therex (how/when to inc/decrease intensity, frequency, rep/set range, stretch hold time, and purpose of therex) with verbalized understanding.  Scaption with Resistance - 1 x daily - 2-3 x weekly - 3 sets - 10 reps Standing High Row with Resistance - 1 x daily - 2-3 x weekly - 3 sets - 10 reps Prone Shoulder Horizontal Abduction with Thumbs Up - 1 x daily - 2-3 x weekly - 3 sets - 10 reps Prone W Scapular Retraction - 1 x daily - 2-3 x weekly - 3 sets - 8 reps Low Trap Setting at Wall - 1 x daily - 2-3 x weekly - 3 sets - 10 reps Prone Shoulder Row - 1 x daily - 2-3 x weekly - 3 sets - 10 reps Seated Overhead Press with Dumbbells - 1 x daily - 2-3 x weekly - 3 sets - 10 reps Standing Shoulder Flexion Wall Walk - 2 x daily - 7 x weekly - 15 reps - 3-5 hold Standing Shoulder Abduction Finger Walk at Wall - 2 x daily - 7 x weekly - 15 reps - 3-5 hold Standing Shoulder Internal Rotation Stretch with Towel - 2 x daily - 7 x weekly - 15 reps - 3-5 hold Doorway Pec Stretch at 120 Degrees Abduction - 3 x daily - 7 x weekly - 30-60sec hold   Manual PROM all directions G1-2 post CHJ mob in 80-90d abd G3post GHJmobswithmovement  intoflex/abd/ER/IR STM to bicep (severe pain to minimal palpation), UT, and pec minor.  Ptguarded throughoutall PROM especially with ER and abd                   PT Education - 11/08/20 0954    Education Details therex form/technique, HEP update    Person(s) Educated Patient    Methods Explanation;Demonstration;Verbal cues    Comprehension Verbalized understanding;Returned demonstration;Verbal cues required            PT  Short Term Goals - 09/27/20 1037      PT SHORT TERM GOAL #1   Title Patient will demonstrate independence with HEP to be able to decrease pain and improve L shoulder function    Baseline 07/25/20 HEP given; 09/27/20 completing 50% of the time    Time 4    Period Weeks    Status On-going             PT Laird Term Goals - 09/27/20 1037      PT [place] TERM GOAL #1   Title Patient will decrease worst pain as reported on NPRS scale by 3 points to demonstrate a clinically significant reduction in pain.    Baseline 07/25/20 10/10 at worst; 09/27/20 4/10    Time 8    Period Weeks    Status Achieved      PT [place] TERM GOAL #2   Title Patient will increase L shoulder AROM to be [facility] in order to complete ADLs and household activities without pain or difficulty    Baseline 07/25/20 flexion 109, ER base of skull, IR S1, abd 45; 07/25/20 flexion 139, ER C7, IR L1, abd 110    Time 8    Period Weeks    Status Partially Met      PT Gonzalez TERM GOAL #3   Title Patient will increase L shoulder MMT strength to 4/5 to be able to raise arm overhead to complete household chores and return to PLOF    Baseline 07/25/20 flexion, abd, ER, ext 2+ IR 3; 09/27/20 in availible range flex: 4+ abd: 4 ER: 4- IR: 4-    Time 8    Period Weeks    Status On-going      PT Kahan TERM GOAL #4   Title Patient will increase FOTO score to 64 to demonstrate predicted increase in functional mobility to complete ADLs    Baseline 07/25/20 47 09/27/20 63    Time 8    Period Weeks    Status New                  Plan - 11/08/20 1001    Clinical Impression Statement PT continued therex progression with focus on overhead mobility and strength with sucess. PT updated HEP to reflect need for maintenance over period of decreased appt frequency d/t insurance constraints. Patient is motivated throughout session and responds well to all pain science education with good understanding, and increased ability to move heavier weight following. Pt is able to comply with all cuing for proper technique of therex with predicted soreness, no increased pain. Pt given clinic contact info should any questions or concerns arise in period without PT.    Personal Factors and Comorbidities Comorbidity 1;Past/Current Experience;Time since onset of injury/illness/exacerbation;Fitness    Comorbidities R breast cancer    Examination-Activity Limitations Bathing;Sleep;Lift;Dressing;Reach Overhead;Hygiene/Grooming    Examination-Participation Restrictions Cleaning;Laundry;Yard Work;Community Activity    Stability/Clinical Decision Making Evolving/Moderate complexity    Clinical Decision Making Moderate    Rehab Potential Good    PT Frequency 2x / week    PT Duration 8 weeks    PT Treatment/Interventions ADLs/Self Care Home Management;Electrical Stimulation;Moist Heat;Iontophoresis 46m/ml Dexamethasone;Traction;Ultrasound;Functional mobility training;Therapeutic activities;Therapeutic exercise;Neuromuscular re-education;Patient/family education;Manual techniques;Passive range of motion;Dry needling;Scar mobilization;Joint Manipulations;Other (comment)    PT Next Visit Plan continue with mobility and single plane strength    PT Home Exercise Plan --    Consulted and Agree with Plan of Care Patient  Patient will benefit from skilled therapeutic intervention in order to improve the following deficits and impairments:  Decreased mobility,Hypomobility,Improper body mechanics,Impaired tone,Decreased range  of motion,Decreased strength,Increased fascial restricitons,Impaired flexibility,Impaired UE functional use,Postural dysfunction,Pain  Visit Diagnosis: Stiffness of left shoulder, not elsewhere classified  Chronic left shoulder pain     Problem List Patient Active Problem List   Diagnosis Date Noted  . Breast cancer ([place]) 03/21/2020  . Anemia due to antineoplastic chemotherapy 02/15/2020  . Cardiomyopathy ([place]) 12/12/2019  . Dehydration 11/24/2019  . Acute pain of left shoulder 11/24/2019  . Orthostatic hypotension 10/12/2019  . Low serum cortisol level ([place]) 09/29/2019  . Malignant neoplasm of breast in female, estrogen receptor negative ([place]) 09/28/2019  . Hypokalemia 09/15/2019  . Hypocalcemia 09/15/2019  . Normocytic anemia 09/15/2019  . B12 deficiency 09/15/2019  . Iron deficiency anemia 09/15/2019  . Encounter for antineoplastic chemotherapy 08/25/2019  . Goals of care, counseling/discussion 08/17/2019  . Weight loss 07/31/2019  . Invasive carcinoma of breast ([place]) 07/30/2019  . H/O diarrhea 01/01/2017   Durwin Reges DPT Durwin Reges 11/08/2020, 11:32 AM  [place] [facility] [address] [place], [address] Phone: [phone]   Fax:  [phone]  Name: Heather Bell MRN: [id number] Date of Birth: 1976/04/29

## 2020-11-10 ENCOUNTER — Encounter: Payer: BC Managed Care – PPO | Admitting: Physical Therapy

## 2020-11-15 ENCOUNTER — Encounter: Payer: BC Managed Care – PPO | Admitting: Physical Therapy

## 2020-11-17 ENCOUNTER — Encounter: Payer: BC Managed Care – PPO | Admitting: Physical Therapy

## 2020-11-23 ENCOUNTER — Ambulatory Visit: Payer: BC Managed Care – PPO | Attending: Sports Medicine | Admitting: Physical Therapy

## 2020-11-23 ENCOUNTER — Encounter: Payer: Self-pay | Admitting: Physical Therapy

## 2020-11-23 ENCOUNTER — Other Ambulatory Visit: Payer: Self-pay

## 2020-11-23 DIAGNOSIS — M25512 Pain in left shoulder: Secondary | ICD-10-CM | POA: Insufficient documentation

## 2020-11-23 DIAGNOSIS — M25612 Stiffness of left shoulder, not elsewhere classified: Secondary | ICD-10-CM | POA: Insufficient documentation

## 2020-11-23 DIAGNOSIS — G8929 Other chronic pain: Secondary | ICD-10-CM | POA: Diagnosis present

## 2020-11-23 NOTE — Therapy (Signed)
[place] [facility] [address], [place], [address] Phone: [phone]   Fax:  [phone]  Physical Therapy Treatment  Patient Details  Name: Heather Bell MRN: [id number] Date of Birth: [birth year] No data recorded  Encounter Date: 11/23/2020   PT End of Session - 11/23/20 0959    Visit Number 39    Number of Visits 50    Date for PT Re-Evaluation 11/25/20    Authorization - Visit Number 56    Authorization - Number of Visits 25    PT Start Time 0945    PT Stop Time 1030    PT Time Calculation (min) 45 min    Activity Tolerance Patient tolerated treatment well;Patient limited by pain    Behavior During Therapy [facility] for tasks assessed/performed           Past Medical History:  Diagnosis Date  . Allergic rhinitis   . Anxiety   . Bipolar disorder ([place])    depression  . Breast cancer ([place]) 03/21/2020   Right mastectomy  . Cancer [facility])    breast right  . Depression   . Diabetes ([place])   . GERD (gastroesophageal reflux disease)   . Hyperlipidemia   . Personal history of chemotherapy    Jan 2021 through July 2021    Past Surgical History:  Procedure Laterality Date  . BREAST BIOPSY Right 07/22/2019   mass bx, path pending, heart marker  . BREAST BIOPSY Right 07/22/2019   LN bx, path pending,  butterfly hydromarker  . BREAST RECONSTRUCTION WITH PLACEMENT OF TISSUE EXPANDER AND FLEX HD (ACELLULAR HYDRATED DERMIS) Right 03/21/2020   Procedure: RIGHT BREAST RECONSTRUCTION WITH PLACEMENT OF TISSUE EXPANDER AND FLEX HD (ACELLULAR HYDRATED DERMIS);  Surgeon: [name], MD;  Location: [facility];  Service: Plastics;  Laterality: Right;  . IMAGE GUIDED SINUS SURGERY    . INCISION AND DRAINAGE OF WOUND Right 05/02/2020   Procedure: IRRIGATION AND DEBRIDEMENT WOUND WITH REMOVAL OF RIGHT TISSUE EXPANDER;  Surgeon: [name], MD;  Location: [facility];  Service: Plastics;  Laterality: Right;  1  hour please  . KNEE SURGERY    . MASTECTOMY Right 03/21/2020  . NASAL SEPTUM SURGERY    . PORTACATH PLACEMENT N/A 08/07/2019   Procedure: INSERTION PORT-A-CATH;  Surgeon: Herbert Pun, MD;  Location: [facility];  Service: General;  Laterality: N/A;  . TONSILLECTOMY    . TOTAL MASTECTOMY Right 03/21/2020   Procedure: TOTAL MASTECTOMY;  Surgeon: Herbert Pun, MD;  Location: [facility];  Service: General;  Laterality: Right;    There were no vitals filed for this visit.   Subjective Assessment - 11/23/20 0950    Subjective Reports pain is about the same, is 2/10. Reports 50% compliance with HEP as prescribed.    Pertinent History Patient is a 45 year old female with L shoulder pain that began after a L port-a-cath was inserted last December for chemotherapy treatments for R breast cancer. Pt has concluded chemo treatments, but per onocologist is to keep port for a "little while longer". Has had R masectomy and R expander placement and removal (no current plans for reconstruction). Pt reports a steady increase in pain and loss of motion over time since the port was inserted. Pt has received cortisone shots in L shoulder for bursitis last July and stated it helps decrease pain for some time but did not last very [place]. Pt had an expander put in on R  side post masectomy last year but the tissue died over it and is thinking about getting another one. Pt has difficulty and pain with all movements and states only rest and no movement makes her L shoulder feel better. Pt scales the pain a 10/10 at worst and 1/10 at best when not moving it. Pt is sleeping on her back but depending on if she rolls over, her L shoulder can be painful in the AM. Patient denies any N/V, B&B, unexplained weight loss, night pain, fever, or night sweats.    Limitations Lifting;House hold activities    How [place] can you sit comfortably? unlimited    How [place] can you stand comfortably? unlimited    How [place] can you walk  comfortably? unlimited    Patient Stated Goals decrease pain, be able to move LUE to PLOF    Pain Onset More than a month ago             Ther-Ex TRX overhead flexionwalkx15  TRX overhead abd walkx15  Squat with overhead press 15# 3x 5 with some fatigue noted with less elbow ext at final sets  OMEGA chest press/slight overhead 15# x5/5/6 with min cuing to prevent excessive scapular protraction on LUE with good carry over  Lat pulldown 20# 3x 10/9/8with cuing for scapular motion and eccentric  Prone T 3x 6 with min cuing for scapular retraction      Manual PROM all directions G1-2 post CHJ mob in 80-90d abd G3post GHJmobswithmovement intoflex/abd/ER/IR STM to bicep (severe pain to minimal palpation), UT, and pec minor.  Ptguarded throughoutall PROM especially with ER and abd                          PT Education - 11/23/20 0954    Education Details therex form/technique, HEP update    Person(s) Educated Patient    Methods Explanation;Demonstration;Verbal cues    Comprehension Verbalized understanding;Returned demonstration;Verbal cues required            PT Short Term Goals - 09/27/20 1037      PT SHORT TERM GOAL #1   Title Patient will demonstrate independence with HEP to be able to decrease pain and improve L shoulder function    Baseline 07/25/20 HEP given; 09/27/20 completing 50% of the time    Time 4    Period Weeks    Status On-going             PT [place] Term Goals - 09/27/20 1037      PT Mcder[place] TERM GOAL #1   Title Patient will decrease worst pain as reported on NPRS scale by 3 points to demonstrate a clinically significant reduction in pain.    Baseline 07/25/20 10/10 at worst; 09/27/20 4/10    Time 8    Period Weeks    Status Achieved      PT Bernier TERM GOAL #2   Title Patient will increase L shoulder AROM to be [facility] in order to complete ADLs and household activities without pain or difficulty    Baseline  07/25/20 flexion 109, ER base of skull, IR S1, abd 45; 07/25/20 flexion 139, ER C7, IR L1, abd 110    Time 8    Period Weeks    Status Partially Met      PT [place] TERM GOAL #3   Title Patient will increase L shoulder MMT strength to 4/5 to be able to raise arm overhead to complete household chores  and return to PLOF    Baseline 07/25/20 flexion, abd, ER, ext 2+ IR 3; 09/27/20 in availible range flex: 4+ abd: 4 ER: 4- IR: 4-    Time 8    Period Weeks    Status On-going      PT Wilds TERM GOAL #4   Title Patient will increase FOTO score to 64 to demonstrate predicted increase in functional mobility to complete ADLs    Baseline 07/25/20 47 09/27/20 63    Time 8    Period Weeks    Status New                 Plan - 11/23/20 1027    Clinical Impression Statement PT continued therex progression with continued focus on overhead mobility and normalized scapulohumeral rhythm with success. Pt demonstrates no regressions since last visit with decreased PT frequency . Pt is able to demonstrate good carry over of all cuing for proper technique of therex with min encouragement for progression. PT will continue progression as able    Personal Factors and Comorbidities Comorbidity 1;Past/Current Experience;Time since onset of injury/illness/exacerbation;Fitness    Comorbidities R breast cancer    Examination-Activity Limitations Bathing;Sleep;Lift;Dressing;Reach Overhead;Hygiene/Grooming    Examination-Participation Restrictions Cleaning;Laundry;Yard Work;Community Activity    Stability/Clinical Decision Making Evolving/Moderate complexity    Clinical Decision Making Moderate    Rehab Potential Good    PT Frequency 2x / week    PT Duration 8 weeks    PT Treatment/Interventions ADLs/Self Care Home Management;Electrical Stimulation;Moist Heat;Iontophoresis 75m/ml Dexamethasone;Traction;Ultrasound;Functional mobility training;Therapeutic activities;Therapeutic exercise;Neuromuscular  re-education;Patient/family education;Manual techniques;Passive range of motion;Dry needling;Scar mobilization;Joint Manipulations;Other (comment)    PT Next Visit Plan continue with mobility and single plane strength    PT Home Exercise Plan [email or web address]    Consulted and Agree with Plan of Care Patient           Patient will benefit from skilled therapeutic intervention in order to improve the following deficits and impairments:  Decreased mobility,Hypomobility,Improper body mechanics,Impaired tone,Decreased range of motion,Decreased strength,Increased fascial restricitons,Impaired flexibility,Impaired UE functional use,Postural dysfunction,Pain  Visit Diagnosis: Stiffness of left shoulder, not elsewhere classified  Chronic left shoulder pain     Problem List Patient Active Problem List   Diagnosis Date Noted  . Breast cancer ([place] 03/21/2020  . Anemia due to antineoplastic chemotherapy 02/15/2020  . Cardiomyopathy ([place] 12/12/2019  . Dehydration 11/24/2019  . Acute pain of left shoulder 11/24/2019  . Orthostatic hypotension 10/12/2019  . Low serum cortisol level ([place] 09/29/2019  . Malignant neoplasm of breast in female, estrogen receptor negative ([place] 09/28/2019  . Hypokalemia 09/15/2019  . Hypocalcemia 09/15/2019  . Normocytic anemia 09/15/2019  . B12 deficiency 09/15/2019  . Iron deficiency anemia 09/15/2019  . Encounter for antineoplastic chemotherapy 08/25/2019  . Goals of care, counseling/discussion 08/17/2019  . Weight loss 07/31/2019  . Invasive carcinoma of breast ([place] 07/30/2019  . H/O diarrhea 01/01/2017   CDurwin RegesDPT CDurwin Reges4/01/2021, 11:58 AM  [facility] [address] [place] [address]Phone: [phone]  Fax:  [phone] Name: Heather FLYNTMRN: [id number] of Birth: [birth year]

## 2020-12-01 ENCOUNTER — Other Ambulatory Visit: Payer: Self-pay

## 2020-12-01 ENCOUNTER — Ambulatory Visit (INDEPENDENT_AMBULATORY_CARE_PROVIDER_SITE_OTHER): Payer: BC Managed Care – PPO

## 2020-12-01 VITALS — BP 102/68 | HR 102 | Ht 66.0 in | Wt 150.0 lb

## 2020-12-01 DIAGNOSIS — C773 Secondary and unspecified malignant neoplasm of axilla and upper limb lymph nodes: Secondary | ICD-10-CM | POA: Diagnosis not present

## 2020-12-01 DIAGNOSIS — C50911 Malignant neoplasm of unspecified site of right female breast: Secondary | ICD-10-CM | POA: Diagnosis not present

## 2020-12-13 ENCOUNTER — Ambulatory Visit: Payer: BC Managed Care – PPO | Admitting: Physical Therapy

## 2020-12-15 ENCOUNTER — Encounter: Payer: Self-pay | Admitting: Physical Therapy

## 2020-12-15 ENCOUNTER — Other Ambulatory Visit: Payer: Self-pay

## 2020-12-15 ENCOUNTER — Ambulatory Visit: Payer: BC Managed Care – PPO | Admitting: Physical Therapy

## 2020-12-15 DIAGNOSIS — M25612 Stiffness of left shoulder, not elsewhere classified: Secondary | ICD-10-CM

## 2020-12-15 DIAGNOSIS — G8929 Other chronic pain: Secondary | ICD-10-CM

## 2020-12-15 DIAGNOSIS — M25512 Pain in left shoulder: Secondary | ICD-10-CM

## 2020-12-15 NOTE — H&P (View-Only) (Signed)
Patient ID: Heather Bell, female    DOB: 08-01-1976, 45 y.o.   MRN: [id number]  Chief Complaint  Patient presents with  . Pre-op Exam    Preop DX: Right Breast Malignancy   History of Present Illness: Heather Bell is a 45 y.o.  female  with a history of Right Breast Cancer   She presents for preoperative evaluation for upcoming procedure:  Right Breast Reconstruction with Tissue Expander & Dermal Matrix  scheduled for: 12/27/20  with Dr. Claudia Desanctis  The patient has not had problems with anesthesia.   Summary of Previous Visit:- Consult for Breast Reconstruction  Job:   PMH Significant for: DM / Breast CA /    Past Medical History: Allergies: Allergies  Allergen Reactions  . Compazine [Prochlorperazine Edisylate] Other (See Comments)    Acute dystonic reaction  . Morphine And Related Shortness Of Breath  . Doxycycline Other (See Comments)    "made all my symptoms worse"   . Fluticasone Other (See Comments)    Doesn't work  . Levofloxacin Other (See Comments)    "made all my symptoms worse"     Current Medications:  Current Outpatient Medications:  .  amphetamine-dextroamphetamine (ADDERALL XR) 25 MG 24 hr capsule, Take 25 mg by mouth daily., Disp: , Rfl:  .  buPROPion (WELLBUTRIN SR) 150 MG 12 hr tablet, Take 150 mg by mouth 2 (two) times daily., Disp: , Rfl:  .  Calcium Carb-Cholecalciferol (CALCIUM 600+D) 600-800 MG-UNIT TABS, Take 1 tablet by mouth daily., Disp: , Rfl:  .  cetirizine (ZYRTEC) 10 MG tablet, Take 10 mg by mouth daily. , Disp: , Rfl:  .  diazepam (VALIUM) 10 MG tablet, Take 10 mg by mouth 2 (two) times daily., Disp: , Rfl:  .  doxepin (SINEQUAN) 25 MG capsule, Take 25-50 mg by mouth at bedtime. , Disp: , Rfl:  .  ibuprofen (ADVIL) 200 MG tablet, Take 400-800 mg by mouth every 6 (six) hours as needed for headache or moderate pain., Disp: , Rfl:  .  lamoTRIgine (LAMICTAL) 150 MG tablet, Take 150 mg by mouth daily., Disp: , Rfl:  .  lurasidone (LATUDA) 40  MG TABS tablet, Take 40 mg by mouth at bedtime. , Disp: , Rfl:  .  Lurasidone HCl 60 MG TABS, Take 60 mg by mouth at bedtime. , Disp: , Rfl:  .  metFORMIN (GLUCOPHAGE-XR) 500 MG 24 hr tablet, Take 500 mg by mouth 2 (two) times daily., Disp: , Rfl:  .  mometasone (NASONEX) 50 MCG/ACT nasal spray, Place 2 sprays into the nose daily as needed (allergies). , Disp: , Rfl:  .  Multiple Vitamin (MULTIVITAMIN WITH MINERALS) TABS tablet, Take 1 tablet by mouth daily., Disp: , Rfl:  .  omeprazole (PRILOSEC) 20 MG capsule, Take 20 mg by mouth daily before breakfast. , Disp: , Rfl:  .  QUEtiapine (SEROQUEL) 300 MG tablet, Take 300 mg by mouth at bedtime., Disp: , Rfl:  .  simvastatin (ZOCOR) 20 MG tablet, Take 20 mg by mouth daily., Disp: , Rfl:  .  topiramate (TOPAMAX) 100 MG tablet, Take 50-100 mg by mouth See admin instructions. Take 1 tablet (100 mg) by mouth in the morning & take 0.5 tablet (50 mg) by mouth at night., Disp: , Rfl:  .  vitamin B-12 (CYANOCOBALAMIN) 1000 MCG tablet, Take 1,000 mcg by mouth daily., Disp: , Rfl:  .  HYDROcodone-acetaminophen (NORCO/VICODIN) 5-325 MG tablet, as needed., Disp: , Rfl:  .  lidocaine-prilocaine (  EMLA) cream, Apply topically over port-a-cath 30 minutes to 1 hour prior to treatment., Disp: 30 g, Rfl: 1 .  LORazepam (ATIVAN) 0.5 MG tablet, Take 0.5 mg by mouth every 8 (eight) hours as needed (nausea)., Disp: , Rfl:  .  oxyCODONE-acetaminophen (PERCOCET) 7.5-325 MG tablet, Take by mouth as needed., Disp: , Rfl:  No current facility-administered medications for this visit.  Facility-Administered Medications Ordered in Other Visits:  .  0.9 %  sodium chloride infusion, , Intravenous, Continuous, Lequita Asal, MD, Stopped at 01/08/20 1516 .  sodium chloride flush (NS) 0.9 % injection 10 mL, 10 mL, Intravenous, PRN, Mike Gip, Melissa C, MD, 10 mL at 01/08/20 1314  Past Medical Problems: Past Medical History:  Diagnosis Date  . Allergic rhinitis   . Anxiety    . Bipolar disorder ([place])    depression  . Breast cancer ([place]) 03/21/2020   Right mastectomy  . Cancer [facility])    breast right  . Depression   . Diabetes ([place])   . GERD (gastroesophageal reflux disease)   . Hyperlipidemia   . Personal history of chemotherapy    Jan 2021 through July 2021    Past Surgical History: Past Surgical History:  Procedure Laterality Date  . BREAST BIOPSY Right 07/22/2019   mass bx, path pending, heart marker  . BREAST BIOPSY Right 07/22/2019   LN bx, path pending,  butterfly hydromarker  . BREAST RECONSTRUCTION WITH PLACEMENT OF TISSUE EXPANDER AND FLEX HD (ACELLULAR HYDRATED DERMIS) Right 03/21/2020   Procedure: RIGHT BREAST RECONSTRUCTION WITH PLACEMENT OF TISSUE EXPANDER AND FLEX HD (ACELLULAR HYDRATED DERMIS);  Surgeon: [name], MD;  Location: [facility];  Service: Plastics;  Laterality: Right;  . IMAGE GUIDED SINUS SURGERY    . INCISION AND DRAINAGE OF WOUND Right 05/02/2020   Procedure: IRRIGATION AND DEBRIDEMENT WOUND WITH REMOVAL OF RIGHT TISSUE EXPANDER;  Surgeon: [name], MD;  Location: [place];  Service: Plastics;  Laterality: Right;  1 hour please  . KNEE SURGERY    . MASTECTOMY Right 03/21/2020  . NASAL SEPTUM SURGERY    . PORTACATH PLACEMENT N/A 08/07/2019   Procedure: INSERTION PORT-A-CATH;  Surgeon: Herbert Pun, MD;  Location: [facility];  Service: General;  Laterality: N/A;  . TONSILLECTOMY    . TOTAL MASTECTOMY Right 03/21/2020   Procedure: TOTAL MASTECTOMY;  Surgeon: Herbert Pun, MD;  Location: [facility];  Service: General;  Laterality: Right;    Social History: Social History   Socioeconomic History  . Marital status: Married    Spouse name: Not on file  . Number of children: Not on file  . Years of education: Not on file  . Highest education level: Not on file  Occupational History  . Not on file  Tobacco Use  . Smoking status: Never Smoker  . Smokeless tobacco: Never Used   Vaping Use  . Vaping Use: Never used  Substance and Sexual Activity  . Alcohol use: No    Alcohol/week: 0.0 standard drinks  . Drug use: No  . Sexual activity: Not on file  Other Topics Concern  . Not on file  Social History Narrative  . Not on file   Social Determinants of Health   Financial Resource Strain: Not on file  Food Insecurity: Not on file  Transportation Needs: Not on file  Physical Activity: Not on file  Stress: Not on file  Social Connections: Not on file  Intimate Partner Violence: Not on file    Family History:  Family History  Problem Relation Age of Onset  . Lung cancer Father   . Heart attack Father   . Lung cancer Paternal Uncle   . Heart disease Paternal Uncle   . Lung cancer Paternal Aunt   . Breast cancer Neg Hx     Review of Systems: Review of Systems  Constitutional: Negative for weight loss.  HENT: Negative.   Eyes: Negative.   Respiratory: Negative.   Cardiovascular: Negative.   Gastrointestinal: Negative.   Genitourinary: Negative.   Musculoskeletal: Negative.   Skin: Negative.   Neurological: Negative.   Endo/Heme/Allergies: Negative.     Physical Exam: Vital Signs BP 102/68 (BP Location: Left Arm, Patient Position: Sitting, Cuff Size: Normal)   Pulse (!) 102   Ht 5\' 6"  (1.676 m)   Wt 150 lb (68 kg)   SpO2 96%   BMI 24.21 kg/m   Physical Exam Constitutional:      General: Not in acute distress.    Appearance: Normal appearance. Not ill-appearing.  HENT:     Head: Normocephalic and atraumatic.  Eyes:     Pupils: Pupils are equal, round Neck:     Musculoskeletal: Normal range of motion.  Cardiovascular:     Rate and Rhythm: Normal rate    Pulses: Normal pulses.  Pulmonary:     Effort: Pulmonary effort is normal. No respiratory distress.  Abdominal:     General: Abdomen is flat. There is no distension.  Musculoskeletal: Normal range of motion.  Skin:    General: Skin is warm and dry.     Findings: No erythema or  rash.  Neurological:     General: No focal deficit present.     Mental Status: Alert and oriented to person, place, and time. Mental status is at baseline.     Motor: No weakness.  Psychiatric:        Mood and Affect: Mood normal.        Behavior: Behavior normal.    Assessment/Plan: The patient is scheduled for Breast Reconstruction with Dr. Claudia Desanctis.   Risks, benefits, and alternatives of procedure discussed, questions answered by myself & Dr. Claudia Desanctis. signed consent obtained & copy scanned into chart   Smoking Status: none  Counseling Given?- n/a Last Mammogram: 07/10/2019  Results: right breast Cancer  Caprini Score: 7  Risk Factors include: Breast cancer / anemia /   and length of planned surgery. Recommendation for mechanical /pharmacological prophylaxis. Encourage early ambulation.   Pictures obtained: on consult  Post-op Rx sent to pharmacy: by Dr. Claudia Desanctis  Patient was provided with the  General Surgical Risk consent document and Pain Medication Agreement prior to their appointment.  They had adequate time to read through the risk consent documents and Pain Medication Agreement. We also discussed them in person together during this preop appointment. All of their questions were answered to their satisfaction.  Recommended calling if they have any further questions.  Risk consent form and Pain Medication Agreement to be scanned into patient's chart.     Electronically signed by: [place], RN 12/15/2020 4:32 PM

## 2020-12-15 NOTE — Progress Notes (Signed)
   Patient ID: Heather Bell, female    DOB: 09/19/1975, 44 y.o.   MRN: [id number]  Chief Complaint  Patient presents with  . Pre-op Exam    Preop DX: Right Breast Malignancy   History of Present Illness: Heather Bell is a 44 y.o.  female  with a history of Right Breast Cancer   She presents for preoperative evaluation for upcoming procedure:  Right Breast Reconstruction with Tissue Expander & Dermal Matrix  scheduled for: 12/27/20  with Dr. Pace  The patient has not had problems with anesthesia.   Summary of Previous Visit:- Consult for Breast Reconstruction  Job:   PMH Significant for: DM / Breast CA /    Past Medical History: Allergies: Allergies  Allergen Reactions  . Compazine [Prochlorperazine Edisylate] Other (See Comments)    Acute dystonic reaction  . Morphine And Related Shortness Of Breath  . Doxycycline Other (See Comments)    "made all my symptoms worse"   . Fluticasone Other (See Comments)    Doesn't work  . Levofloxacin Other (See Comments)    "made all my symptoms worse"     Current Medications:  Current Outpatient Medications:  .  amphetamine-dextroamphetamine (ADDERALL XR) 25 MG 24 hr capsule, Take 25 mg by mouth daily., Disp: , Rfl:  .  buPROPion (WELLBUTRIN SR) 150 MG 12 hr tablet, Take 150 mg by mouth 2 (two) times daily., Disp: , Rfl:  .  Calcium Carb-Cholecalciferol (CALCIUM 600+D) 600-800 MG-UNIT TABS, Take 1 tablet by mouth daily., Disp: , Rfl:  .  cetirizine (ZYRTEC) 10 MG tablet, Take 10 mg by mouth daily. , Disp: , Rfl:  .  diazepam (VALIUM) 10 MG tablet, Take 10 mg by mouth 2 (two) times daily., Disp: , Rfl:  .  doxepin (SINEQUAN) 25 MG capsule, Take 25-50 mg by mouth at bedtime. , Disp: , Rfl:  .  ibuprofen (ADVIL) 200 MG tablet, Take 400-800 mg by mouth every 6 (six) hours as needed for headache or moderate pain., Disp: , Rfl:  .  lamoTRIgine (LAMICTAL) 150 MG tablet, Take 150 mg by mouth daily., Disp: , Rfl:  .  lurasidone (LATUDA) 40  MG TABS tablet, Take 40 mg by mouth at bedtime. , Disp: , Rfl:  .  Lurasidone HCl 60 MG TABS, Take 60 mg by mouth at bedtime. , Disp: , Rfl:  .  metFORMIN (GLUCOPHAGE-XR) 500 MG 24 hr tablet, Take 500 mg by mouth 2 (two) times daily., Disp: , Rfl:  .  mometasone (NASONEX) 50 MCG/ACT nasal spray, Place 2 sprays into the nose daily as needed (allergies). , Disp: , Rfl:  .  Multiple Vitamin (MULTIVITAMIN WITH MINERALS) TABS tablet, Take 1 tablet by mouth daily., Disp: , Rfl:  .  omeprazole (PRILOSEC) 20 MG capsule, Take 20 mg by mouth daily before breakfast. , Disp: , Rfl:  .  QUEtiapine (SEROQUEL) 300 MG tablet, Take 300 mg by mouth at bedtime., Disp: , Rfl:  .  simvastatin (ZOCOR) 20 MG tablet, Take 20 mg by mouth daily., Disp: , Rfl:  .  topiramate (TOPAMAX) 100 MG tablet, Take 50-100 mg by mouth See admin instructions. Take 1 tablet (100 mg) by mouth in the morning & take 0.5 tablet (50 mg) by mouth at night., Disp: , Rfl:  .  vitamin B-12 (CYANOCOBALAMIN) 1000 MCG tablet, Take 1,000 mcg by mouth daily., Disp: , Rfl:  .  HYDROcodone-acetaminophen (NORCO/VICODIN) 5-325 MG tablet, as needed., Disp: , Rfl:  .  lidocaine-prilocaine (  EMLA) cream, Apply topically over port-a-cath 30 minutes to 1 hour prior to treatment., Disp: 30 g, Rfl: 1 .  LORazepam (ATIVAN) 0.5 MG tablet, Take 0.5 mg by mouth every 8 (eight) hours as needed (nausea)., Disp: , Rfl:  .  oxyCODONE-acetaminophen (PERCOCET) 7.5-325 MG tablet, Take by mouth as needed., Disp: , Rfl:  No current facility-administered medications for this visit.  Facility-Administered Medications Ordered in Other Visits:  .  0.9 %  sodium chloride infusion, , Intravenous, Continuous, [place], Melissa C, MD, Stopped at 01/08/20 1516 .  sodium chloride flush (NS) 0.9 % injection 10 mL, 10 mL, Intravenous, PRN, [place], Melissa C, MD, 10 mL at 01/08/20 1314  Past Medical Problems: Past Medical History:  Diagnosis Date  . Allergic rhinitis   . Anxiety    . Bipolar disorder ([facility])    depression  . Breast cancer ([facility]) 03/21/2020   Right mastectomy  . Cancer ([facility])    breast right  . Depression   . Diabetes ([facility])   . GERD (gastroesophageal reflux disease)   . Hyperlipidemia   . Personal history of chemotherapy    Jan 2021 through July 2021    Past Surgical History: Past Surgical History:  Procedure Laterality Date  . BREAST BIOPSY Right 07/22/2019   mass bx, path pending, heart marker  . BREAST BIOPSY Right 07/22/2019   LN bx, path pending,  butterfly hydromarker  . BREAST RECONSTRUCTION WITH PLACEMENT OF TISSUE EXPANDER AND FLEX HD (ACELLULAR HYDRATED DERMIS) Right 03/21/2020   Procedure: RIGHT BREAST RECONSTRUCTION WITH PLACEMENT OF TISSUE EXPANDER AND FLEX HD (ACELLULAR HYDRATED DERMIS);  Surgeon: Pace, Collier S, MD;  Location: [facility];  Service: Plastics;  Laterality: Right;  . IMAGE GUIDED SINUS SURGERY    . INCISION AND DRAINAGE OF WOUND Right 05/02/2020   Procedure: IRRIGATION AND DEBRIDEMENT WOUND WITH REMOVAL OF RIGHT TISSUE EXPANDER;  Surgeon: Pace, Collier S, MD;  Location: [facility];  Service: Plastics;  Laterality: Right;  1 hour please  . KNEE SURGERY    . MASTECTOMY Right 03/21/2020  . NASAL SEPTUM SURGERY    . PORTACATH PLACEMENT N/A 08/07/2019   Procedure: INSERTION PORT-A-CATH;  Surgeon: Cintron-Diaz, Edgardo, MD;  Location: [facility];  Service: General;  Laterality: N/A;  . TONSILLECTOMY    . TOTAL MASTECTOMY Right 03/21/2020   Procedure: TOTAL MASTECTOMY;  Surgeon: Cintron-Diaz, Edgardo, MD;  Location: [facility];  Service: General;  Laterality: Right;    Social History: Social History   Socioeconomic History  . Marital status: Married    Spouse name: Not on file  . Number of children: Not on file  . Years of education: Not on file  . Highest education level: Not on file  Occupational History  . Not on file  Tobacco Use  . Smoking status: Never Smoker  . Smokeless tobacco: Never Used   Vaping Use  . Vaping Use: Never used  Substance and Sexual Activity  . Alcohol use: No    Alcohol/week: 0.0 standard drinks  . Drug use: No  . Sexual activity: Not on file  Other Topics Concern  . Not on file  Social History Narrative  . Not on file   Social Determinants of Health   Financial Resource Strain: Not on file  Food Insecurity: Not on file  Transportation Needs: Not on file  Physical Activity: Not on file  Stress: Not on file  Social Connections: Not on file  Intimate Partner Violence: Not on file    Family History:   Family History  Problem Relation Age of Onset  . Lung cancer Father   . Heart attack Father   . Lung cancer Paternal Uncle   . Heart disease Paternal Uncle   . Lung cancer Paternal Aunt   . Breast cancer Neg Hx     Review of Systems: Review of Systems  Constitutional: Negative for weight loss.  HENT: Negative.   Eyes: Negative.   Respiratory: Negative.   Cardiovascular: Negative.   Gastrointestinal: Negative.   Genitourinary: Negative.   Musculoskeletal: Negative.   Skin: Negative.   Neurological: Negative.   Endo/Heme/Allergies: Negative.     Physical Exam: Vital Signs BP 102/68 (BP Location: Left Arm, Patient Position: Sitting, Cuff Size: Normal)   Pulse (!) 102   Ht 5\' 6"  (1.676 m)   Wt 150 lb (68 kg)   SpO2 96%   BMI 24.21 kg/m   Physical Exam Constitutional:      General: Not in acute distress.    Appearance: Normal appearance. Not ill-appearing.  HENT:     Head: Normocephalic and atraumatic.  Eyes:     Pupils: Pupils are equal, round Neck:     Musculoskeletal: Normal range of motion.  Cardiovascular:     Rate and Rhythm: Normal rate    Pulses: Normal pulses.  Pulmonary:     Effort: Pulmonary effort is normal. No respiratory distress.  Abdominal:     General: Abdomen is flat. There is no distension.  Musculoskeletal: Normal range of motion.  Skin:    General: Skin is warm and dry.     Findings: No erythema or  rash.  Neurological:     General: No focal deficit present.     Mental Status: Alert and oriented to person, place, and time. Mental status is at baseline.     Motor: No weakness.  Psychiatric:        Mood and Affect: Mood normal.        Behavior: Behavior normal.    Assessment/Plan: The patient is scheduled for Breast Reconstruction with Dr. Claudia Desanctis.   Risks, benefits, and alternatives of procedure discussed, questions answered by myself & Dr. Claudia Desanctis. signed consent obtained & copy scanned into chart   Smoking Status: none  Counseling Given?- n/a Last Mammogram: 07/10/2019  Results: right breast Cancer  Caprini Score: 7  Risk Factors include: Breast cancer / anemia /   and length of planned surgery. Recommendation for mechanical /pharmacological prophylaxis. Encourage early ambulation.   Pictures obtained: on consult  Post-op Rx sent to pharmacy: by Dr. Claudia Desanctis  Patient was provided with the  General Surgical Risk consent document and Pain Medication Agreement prior to their appointment.  They had adequate time to read through the risk consent documents and Pain Medication Agreement. We also discussed them in person together during this preop appointment. All of their questions were answered to their satisfaction.  Recommended calling if they have any further questions.  Risk consent form and Pain Medication Agreement to be scanned into patient's chart.     Electronically signed by: [place], RN 12/15/2020 4:32 PM

## 2020-12-15 NOTE — Therapy (Signed)
[facility] [address] [place], [address] Phone: [phone]   Fax:  [phone]  Physical Therapy Treatment  Patient Details  Name: Heather Bell MRN: [id number] Date of Birth: [birth year] No data recorded  Encounter Date: 12/15/2020   PT End of Session - 12/15/20 1040    Visit Number 40    Number of Visits 50    Date for PT Re-Evaluation 02/10/21    Authorization - Visit Number 23    Authorization - Number of Visits 25    PT Start Time 1030    PT Stop Time 1110    PT Time Calculation (min) 40 min    Activity Tolerance Patient tolerated treatment well;Patient limited by pain    Behavior During Therapy [facility] for tasks assessed/performed           Past Medical History:  Diagnosis Date  . Allergic rhinitis   . Anxiety   . Bipolar disorder ([place])    depression  . Breast cancer ([place]) 03/21/2020   Right mastectomy  . Cancer [facility])    breast right  . Depression   . Diabetes ([place])   . GERD (gastroesophageal reflux disease)   . Hyperlipidemia   . Personal history of chemotherapy    Jan 2021 through July 2021    Past Surgical History:  Procedure Laterality Date  . BREAST BIOPSY Right 07/22/2019   mass bx, path pending, heart marker  . BREAST BIOPSY Right 07/22/2019   LN bx, path pending,  butterfly hydromarker  . BREAST RECONSTRUCTION WITH PLACEMENT OF TISSUE EXPANDER AND FLEX HD (ACELLULAR HYDRATED DERMIS) Right 03/21/2020   Procedure: RIGHT BREAST RECONSTRUCTION WITH PLACEMENT OF TISSUE EXPANDER AND FLEX HD (ACELLULAR HYDRATED DERMIS);  Surgeon: [name], MD;  Location: [facility];  Service: Plastics;  Laterality: Right;  . IMAGE GUIDED SINUS SURGERY    . INCISION AND DRAINAGE OF WOUND Right 05/02/2020   Procedure: IRRIGATION AND DEBRIDEMENT WOUND WITH REMOVAL OF RIGHT TISSUE EXPANDER;  Surgeon: [name], MD;  Location: [facility];  Service: Plastics;  Laterality: Right;  1  hour please  . KNEE SURGERY    . MASTECTOMY Right 03/21/2020  . NASAL SEPTUM SURGERY    . PORTACATH PLACEMENT N/A 08/07/2019   Procedure: INSERTION PORT-A-CATH;  Surgeon: Herbert Pun, MD;  Location: [facility];  Service: General;  Laterality: N/A;  . TONSILLECTOMY    . TOTAL MASTECTOMY Right 03/21/2020   Procedure: TOTAL MASTECTOMY;  Surgeon: Herbert Pun, MD;  Location: [facility];  Service: General;  Laterality: Right;    There were no vitals filed for this visit.   Subjective Assessment - 12/15/20 1033    Subjective Patient reports minimal resting pain 1/10 this am. Reports pain at top of shoulder and feels "deep in the joint" with driving, or any deep cleaning and overhead lifting. Does feel like her strength is improving.    Pertinent History Patient is a 45 year old female with L shoulder pain that began after a L port-a-cath was inserted last December for chemotherapy treatments for R breast cancer. Pt has concluded chemo treatments, but per onocologist is to keep port for a "little while longer". Has had R masectomy and R expander placement and removal (no current plans for reconstruction). Pt reports a steady increase in pain and loss of motion over time since the port was inserted. Pt has received cortisone shots in L shoulder for bursitis last July and stated  it helps decrease pain for some time but did not last very [place]. Pt had an expander put in on R side post masectomy last year but the tissue died over it and is thinking about getting another one. Pt has difficulty and pain with all movements and states only rest and no movement makes her L shoulder feel better. Pt scales the pain a 10/10 at worst and 1/10 at best when not moving it. Pt is sleeping on her back but depending on if she rolls over, her L shoulder can be painful in the AM. Patient denies any N/V, B&B, unexplained weight loss, night pain, fever, or night sweats.    Limitations Lifting;House hold activities     How [place] can you sit comfortably? unlimited    How [place] can you stand comfortably? unlimited    How [place] can you walk comfortably? unlimited    Patient Stated Goals decrease pain, be able to move LUE to PLOF    Pain Onset More than a month ago               Ther-Ex TRX overhead flexionwalkx15  TRX overhead abd walkx15  Squat with overhead press 15# 3x 6 with good carry over following cuing for efficient technique (attempted 20# KB with good effort, unable to fully obtain overhead)  Seated scaption 2# DB 3x 10/9/8 with good carry over of cuing for proper technique with scapulohumeral rhythm with good carry over  Y on wall x12; with 1# DB x10 2# x10 with min cuing for proper technique initialy with good carry over  IR AROM apleys to T12 with ppt reporting limited by weakness, motion improves with towel AAROM  IR at side with RTB x10; GTB 2x 10 with min cuing for set up with good carry over (attempted 5# unable)   Manual PROM all directions G1-2 post CHJ mob in 80-90d abd G3post GHJmobswithmovement intoflex/abd/ER/IR- FOLLOWING contract-relax into motion 10sec contract 10sec relax with better success STM to bicep (severe pain to minimal palpation), UT, and pec minor.  Ptguarded throughoutall PROM especially with ER and abd                        PT Education - 12/15/20 1036    Education Details therex form/technique    Person(s) Educated Patient    Methods Explanation;Demonstration;Verbal cues    Comprehension Verbalized understanding;Returned demonstration;Verbal cues required            PT Short Term Goals - 09/27/20 1037      PT SHORT TERM GOAL #1   Title Patient will demonstrate independence with HEP to be able to decrease pain and improve L shoulder function    Baseline 07/25/20 HEP given; 09/27/20 completing 50% of the time    Time 4    Period Weeks    Status On-going             PT [place] Term Goals - 09/27/20 1037       PT [place] TERM GOAL #1   Title Patient will decrease worst pain as reported on NPRS scale by 3 points to demonstrate a clinically significant reduction in pain.    Baseline 07/25/20 10/10 at worst; 09/27/20 4/10    Time 8    Period Weeks    Status Achieved      PT [place] TERM GOAL #2   Title Patient will increase L shoulder AROM to be [facility] in order to complete ADLs and household activities  without pain or difficulty    Baseline 07/25/20 flexion 109, ER base of skull, IR S1, abd 45; 07/25/20 flexion 139, ER C7, IR L1, abd 110    Time 8    Period Weeks    Status Partially Met      PT [place] TERM GOAL #3   Title Patient will increase L shoulder MMT strength to 4/5 to be able to raise arm overhead to complete household chores and return to PLOF    Baseline 07/25/20 flexion, abd, ER, ext 2+ IR 3; 09/27/20 in availible range flex: 4+ abd: 4 ER: 4- IR: 4-    Time 8    Period Weeks    Status On-going      PT [place] TERM GOAL #4   Title Patient will increase FOTO score to 64 to demonstrate predicted increase in functional mobility to complete ADLs    Baseline 07/25/20 47 09/27/20 63    Time 8    Period Weeks    Status New                 Plan - 12/15/20 1053    Clinical Impression Statement PT continued therex progression with continued focus on overhead mobility and strength with success. Patient is continuing to demonstrate increased mobility in all directions, allowing for increased strength training. Pt with difficulty with scapulohumeral rhythm with motion exceeding 90d, which she is able to correct well with multimodal cuing. Patient with muscle gaurding throughout PROM, that she is able to decrease with education and breath control; PT educated patient on this gaurding possibly while she is driving and to focus on trying to relax her UE while driving. PT educated patient on the pain cycle and the increase of pain to anticipatory stimuli (ie driving) which she verbalized understanding of. PT will  continue progression as able.    Personal Factors and Comorbidities Comorbidity 1;Past/Current Experience;Time since onset of injury/illness/exacerbation;Fitness    Comorbidities R breast cancer    Examination-Activity Limitations Bathing;Sleep;Lift;Dressing;Reach Overhead;Hygiene/Grooming    Examination-Participation Restrictions Cleaning;Laundry;Yard Work;Community Activity    Stability/Clinical Decision Making Evolving/Moderate complexity    Clinical Decision Making Moderate    Rehab Potential Good    PT Frequency 2x / week    PT Duration 8 weeks    PT Treatment/Interventions ADLs/Self Care Home Management;Electrical Stimulation;Moist Heat;Iontophoresis 68m/ml Dexamethasone;Traction;Ultrasound;Functional mobility training;Therapeutic activities;Therapeutic exercise;Neuromuscular re-education;Patient/family education;Manual techniques;Passive range of motion;Dry needling;Scar mobilization;Joint Manipulations;Other (comment)    PT Next Visit Plan continue with mobility and single plane strength    PT Home Exercise Plan [id number]    Consulted and Agree with Plan of Care Patient           Patient will benefit from skilled therapeutic intervention in order to improve the following deficits and impairments:  Decreased mobility,Hypomobility,Improper body mechanics,Impaired tone,Decreased range of motion,Decreased strength,Increased fascial restricitons,Impaired flexibility,Impaired UE functional use,Postural dysfunction,Pain  Visit Diagnosis: Stiffness of left shoulder, not elsewhere classified  Chronic left shoulder pain     Problem List Patient Active Problem List   Diagnosis Date Noted  . Breast cancer ([place] 03/21/2020  . Anemia due to antineoplastic chemotherapy 02/15/2020  . Cardiomyopathy ([place] 12/12/2019  . Dehydration 11/24/2019  . Acute pain of left shoulder 11/24/2019  . Orthostatic hypotension 10/12/2019  . [place] serum cortisol level ([place] 09/29/2019  . Malignant neoplasm  of breast in female, estrogen receptor negative ([place] 09/28/2019  . Hypokalemia 09/15/2019  . Hypocalcemia 09/15/2019  . Normocytic anemia 09/15/2019  . B12 deficiency 09/15/2019  . Iron  deficiency anemia 09/15/2019  . Encounter for antineoplastic chemotherapy 08/25/2019  . Goals of care, counseling/discussion 08/17/2019  . Weight loss 07/31/2019  . Invasive carcinoma of breast ([place]) 07/30/2019  . H/O diarrhea 01/01/2017   Durwin Reges DPT Durwin Reges 12/15/2020, 11:14 AM  [place] [address] [place], [address] Phone: [phone]   Fax:  [phone]  Name: Heather Bell MRN: [id number] Date of Birth: January 29, 1976

## 2020-12-19 ENCOUNTER — Encounter: Payer: Self-pay | Admitting: Oncology

## 2020-12-19 ENCOUNTER — Other Ambulatory Visit: Payer: BC Managed Care – PPO

## 2020-12-19 ENCOUNTER — Inpatient Hospital Stay: Payer: BC Managed Care – PPO

## 2020-12-19 ENCOUNTER — Other Ambulatory Visit: Payer: Self-pay

## 2020-12-19 ENCOUNTER — Inpatient Hospital Stay: Payer: BC Managed Care – PPO | Attending: Hematology and Oncology | Admitting: Oncology

## 2020-12-19 VITALS — BP 97/72 | HR 97 | Temp 96.3°F | Resp 198 | Wt 155.2 lb

## 2020-12-19 DIAGNOSIS — Z79899 Other long term (current) drug therapy: Secondary | ICD-10-CM | POA: Insufficient documentation

## 2020-12-19 DIAGNOSIS — D509 Iron deficiency anemia, unspecified: Secondary | ICD-10-CM | POA: Diagnosis not present

## 2020-12-19 DIAGNOSIS — C773 Secondary and unspecified malignant neoplasm of axilla and upper limb lymph nodes: Secondary | ICD-10-CM | POA: Insufficient documentation

## 2020-12-19 DIAGNOSIS — M25512 Pain in left shoulder: Secondary | ICD-10-CM | POA: Diagnosis not present

## 2020-12-19 DIAGNOSIS — M25562 Pain in left knee: Secondary | ICD-10-CM

## 2020-12-19 DIAGNOSIS — M25561 Pain in right knee: Secondary | ICD-10-CM

## 2020-12-19 DIAGNOSIS — C50511 Malignant neoplasm of lower-outer quadrant of right female breast: Secondary | ICD-10-CM

## 2020-12-19 DIAGNOSIS — Z9221 Personal history of antineoplastic chemotherapy: Secondary | ICD-10-CM | POA: Insufficient documentation

## 2020-12-19 DIAGNOSIS — E538 Deficiency of other specified B group vitamins: Secondary | ICD-10-CM | POA: Diagnosis not present

## 2020-12-19 DIAGNOSIS — D649 Anemia, unspecified: Secondary | ICD-10-CM

## 2020-12-19 DIAGNOSIS — Z9011 Acquired absence of right breast and nipple: Secondary | ICD-10-CM | POA: Diagnosis not present

## 2020-12-19 DIAGNOSIS — C50911 Malignant neoplasm of unspecified site of right female breast: Secondary | ICD-10-CM | POA: Insufficient documentation

## 2020-12-19 DIAGNOSIS — Z171 Estrogen receptor negative status [ER-]: Secondary | ICD-10-CM | POA: Insufficient documentation

## 2020-12-19 LAB — COMPREHENSIVE METABOLIC PANEL
ALT: 11 U/L (ref 0–44)
AST: 13 U/L — ABNORMAL LOW (ref 15–41)
Albumin: 4.1 g/dL (ref 3.5–5.0)
Alkaline Phosphatase: 70 U/L (ref 38–126)
Anion gap: 9 (ref 5–15)
BUN: 14 mg/dL (ref 6–20)
CO2: 25 mmol/L (ref 22–32)
Calcium: 9.4 mg/dL (ref 8.9–10.3)
Chloride: 105 mmol/L (ref 98–111)
Creatinine, Ser: 0.73 mg/dL (ref 0.44–1.00)
GFR, Estimated: >60 mL/min (ref >60)
Glucose, Bld: 90 mg/dL (ref 70–99)
Potassium: 3.5 mmol/L (ref 3.5–5.1)
Sodium: 139 mmol/L (ref 135–145)
Total Bilirubin: 0.4 mg/dL (ref 0.3–1.2)
Total Protein: 7.4 g/dL (ref 6.5–8.1)

## 2020-12-19 LAB — CBC WITH DIFFERENTIAL/PLATELET
Abs Immature Granulocytes: 0.02 10*3/uL (ref 0.00–0.07)
Basophils Absolute: 0 10*3/uL (ref 0.0–0.1)
Basophils Relative: 0 %
Eosinophils Absolute: 0.1 10*3/uL (ref 0.0–0.5)
Eosinophils Relative: 3 %
HCT: 34.6 % — ABNORMAL LOW (ref 36.0–46.0)
Hemoglobin: 11.1 g/dL — ABNORMAL LOW (ref 12.0–15.0)
Immature Granulocytes: 0 %
Lymphocytes Relative: 32 %
Lymphs Abs: 1.8 10*3/uL (ref 0.7–4.0)
MCH: 28.3 pg (ref 26.0–34.0)
MCHC: 32.1 g/dL (ref 30.0–36.0)
MCV: 88.3 fL (ref 80.0–100.0)
Monocytes Absolute: 0.4 10*3/uL (ref 0.1–1.0)
Monocytes Relative: 7 %
Neutro Abs: 3.2 10*3/uL (ref 1.7–7.7)
Neutrophils Relative %: 58 %
Platelets: 321 10*3/uL (ref 150–400)
RBC: 3.92 MIL/uL (ref 3.87–5.11)
RDW: 13 % (ref 11.5–15.5)
WBC: 5.6 10*3/uL (ref 4.0–10.5)
nRBC: 0 % (ref 0.0–0.2)

## 2020-12-19 NOTE — Progress Notes (Signed)
Patient here for oncology follow-up appointment, expresses concerns of knee pain  

## 2020-12-19 NOTE — Progress Notes (Addendum)
[facility]  [address], [address] [place], [place] [address] Phone: [phone]  Fax: [phone]   Clinic Day:  12/19/20  Referring physician: [facility], [place]  Chief Complaint: Heather Bell is a 45 y.o. 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  HPI: The patient was last seen in the medical oncology clinic on 09/20/20.  At time she reported left shoulder pain.  Left screening mammogram on 08/09/2020 revealed no evidence of malignancy.  She has been receiving outpatient rehab for her left shoulder with improvement.  She had her port removed by Dr. Windell Moment on 10/06/2020.  She was evaluated by Dr. Claudia Desanctis on 11/02/2020 for right side breast reconstruction.  Plan is to replace her tissue expanders.  This is scheduled for 01/04/2021.  During the interim, she is doing well.  Feels her left shoulder has improved some.  Reports weakness in bilateral knees.  Patient might benefit from additional physical therapy but unsure if her insurance approves.  Has questions regarding imaging of her right axillary given she had to positive lymph nodes.  Currently denies any new symptoms or lumps or bumps.   Past Medical History:  Diagnosis Date  . Allergic rhinitis   . Anxiety   . Bipolar disorder ([place])    depression  . Breast cancer ([place]) 03/21/2020   Right mastectomy  . Cancer [facility])    breast right  . Depression   . Diabetes ([place])   . GERD (gastroesophageal reflux disease)   . Hyperlipidemia   . Personal history of chemotherapy    Jan 2021 through July 2021    Past Surgical History:  Procedure Laterality Date  . BREAST BIOPSY Right 07/22/2019   mass bx, path pending, heart marker  . BREAST BIOPSY Right 07/22/2019   LN bx, path pending,  butterfly hydromarker  . BREAST RECONSTRUCTION WITH PLACEMENT OF TISSUE EXPANDER AND FLEX HD (ACELLULAR  HYDRATED DERMIS) Right 03/21/2020   Procedure: RIGHT BREAST RECONSTRUCTION WITH PLACEMENT OF TISSUE EXPANDER AND FLEX HD (ACELLULAR HYDRATED DERMIS);  Surgeon: [name], MD;  Location: [facility];  Service: Plastics;  Laterality: Right;  . IMAGE GUIDED SINUS SURGERY    . INCISION AND DRAINAGE OF WOUND Right 05/02/2020   Procedure: IRRIGATION AND DEBRIDEMENT WOUND WITH REMOVAL OF RIGHT TISSUE EXPANDER;  Surgeon: [name], MD;  Location: [place];  Service: Plastics;  Laterality: Right;  1 hour please  . KNEE SURGERY    . MASTECTOMY Right 03/21/2020  . NASAL SEPTUM SURGERY    . PORTACATH PLACEMENT N/A 08/07/2019   Procedure: INSERTION PORT-A-CATH;  Surgeon: Herbert Pun, MD;  Location: [facility];  Service: General;  Laterality: N/A;  . TONSILLECTOMY    . TOTAL MASTECTOMY Right 03/21/2020   Procedure: TOTAL MASTECTOMY;  Surgeon: Herbert Pun, MD;  Location: [facility];  Service: General;  Laterality: Right;    Family History  Problem Relation Age of Onset  . Lung cancer Father   . Heart attack Father   . Lung cancer Paternal Uncle   . Heart disease Paternal Uncle   . Lung cancer Paternal Aunt   . Breast cancer Neg Hx     Social History:  reports that she has never smoked. She has never used smokeless tobacco. She reports that she does not drink alcohol and does not use drugs. Deniesany exposure to radiation or toxins.She has 2 children (68 year old  daughter and 48 year old son).She use to work inthefast foodindustry. She is currently not working. Her husbandisMichael.Michaelused totravel a lot for his job.Her husband found a new job closer to home in [place], [place]. The patient is alone today.   Allergies:  Allergies  Allergen Reactions  . Compazine [Prochlorperazine Edisylate] Other (See Comments)    Acute dystonic reaction  . Morphine And Related Shortness Of Breath  . Doxycycline Other (See Comments)    "made all my symptoms worse"    . Fluticasone Other (See Comments)    Doesn't work  . Levofloxacin Other (See Comments)    "made all my symptoms worse"     Current Medications: Current Outpatient Medications  Medication Sig Dispense Refill  . amphetamine-dextroamphetamine (ADDERALL XR) 25 MG 24 hr capsule Take 25 mg by mouth daily.    Marland Kitchen buPROPion (WELLBUTRIN SR) 150 MG 12 hr tablet Take 150 mg by mouth 2 (two) times daily.    . Calcium Carb-Cholecalciferol (CALCIUM 600+D) 600-800 MG-UNIT TABS Take 1 tablet by mouth daily.    . cetirizine (ZYRTEC) 10 MG tablet Take 10 mg by mouth daily.     Marland Kitchen doxepin (SINEQUAN) 25 MG capsule Take 25-50 mg by mouth at bedtime.     Marland Kitchen ibuprofen (ADVIL) 200 MG tablet Take 400-800 mg by mouth every 6 (six) hours as needed for headache or moderate pain.    Marland Kitchen lamoTRIgine (LAMICTAL) 150 MG tablet Take 150 mg by mouth daily.    Marland Kitchen lidocaine-prilocaine (EMLA) cream Apply topically over port-a-cath 30 minutes to 1 hour prior to treatment. 30 g 1  . LORazepam (ATIVAN) 0.5 MG tablet Take 0.5 mg by mouth every 8 (eight) hours as needed (nausea).    . lurasidone (LATUDA) 40 MG TABS tablet Take 40 mg by mouth at bedtime.     . Lurasidone HCl 60 MG TABS Take 60 mg by mouth at bedtime.     . metFORMIN (GLUCOPHAGE-XR) 500 MG 24 hr tablet Take 500 mg by mouth 2 (two) times daily.    . mometasone (NASONEX) 50 MCG/ACT nasal spray Place 2 sprays into the nose daily as needed (allergies).     . Multiple Vitamin (MULTIVITAMIN WITH MINERALS) TABS tablet Take 1 tablet by mouth daily.    Marland Kitchen omeprazole (PRILOSEC) 20 MG capsule Take 20 mg by mouth daily before breakfast.     . oxyCODONE-acetaminophen (PERCOCET) 7.5-325 MG tablet Take by mouth as needed.    Marland Kitchen QUEtiapine (SEROQUEL) 300 MG tablet Take 300 mg by mouth at bedtime.    . simvastatin (ZOCOR) 20 MG tablet Take 20 mg by mouth daily.    Marland Kitchen topiramate (TOPAMAX) 100 MG tablet Take 50-100 mg by mouth See admin instructions. Take 1 tablet (100 mg) by mouth in the  morning & take 0.5 tablet (50 mg) by mouth at night.    . vitamin B-12 (CYANOCOBALAMIN) 1000 MCG tablet Take 1,000 mcg by mouth daily.    . diazepam (VALIUM) 10 MG tablet Take 10 mg by mouth 2 (two) times daily. (Patient not taking: Reported on 12/19/2020)    . HYDROcodone-acetaminophen (NORCO/VICODIN) 5-325 MG tablet as needed. (Patient not taking: Reported on 12/19/2020)     No current facility-administered medications for this visit.   Facility-Administered Medications Ordered in Other Visits  Medication Dose Route Frequency Provider Last Rate Last Admin  . 0.9 %  sodium chloride infusion   Intravenous Continuous Lequita Asal, MD   Stopped at 01/08/20 1516  . sodium chloride flush (  NS) 0.9 % injection 10 mL  10 mL Intravenous PRN Lequita Asal, MD   10 mL at 01/08/20 1314   Review of Systems  Constitutional: Positive for malaise/fatigue. Negative for chills, fever and weight loss.  HENT: Negative for congestion, ear pain and tinnitus.   Eyes: Negative.  Negative for blurred vision and double vision.  Respiratory: Negative.  Negative for cough, sputum production and shortness of breath.   Cardiovascular: Negative.  Negative for chest pain, palpitations and leg swelling.  Gastrointestinal: Negative.  Negative for abdominal pain, constipation, diarrhea, nausea and vomiting.  Genitourinary: Negative for dysuria, frequency and urgency.  Musculoskeletal: Positive for joint pain (Shoulder knees). Negative for back pain and falls.  Skin: Negative.  Negative for rash.  Neurological: Positive for weakness. Negative for headaches.  Endo/Heme/Allergies: Negative.  Does not bruise/bleed easily.  Psychiatric/Behavioral: Negative.  Negative for depression. The patient is not nervous/anxious and does not have insomnia.    Performance status (ECOG):  0  Vitals Blood pressure 97/72, pulse 97, temperature (!) 96.3 F (35.7 C), temperature source Tympanic, resp. rate (!) 198, weight 155 lb 3.3  oz (70.4 kg), SpO2 100 %.   Physical Exam Constitutional:      Appearance: She is well-developed.  HENT:     Head: Normocephalic and atraumatic.  Eyes:     Pupils: Pupils are equal, round, and reactive to light.  Cardiovascular:     Rate and Rhythm: Normal rate and regular rhythm.     Heart sounds: No murmur heard.   Pulmonary:     Effort: Pulmonary effort is normal.     Breath sounds: Normal breath sounds. No wheezing.  Abdominal:     General: Bowel sounds are normal. There is no distension.     Palpations: Abdomen is soft. There is no mass.     Tenderness: There is no abdominal tenderness.  Musculoskeletal:        General: Normal range of motion.     Cervical back: Normal range of motion.  Skin:    General: Skin is warm and dry.  Neurological:     Mental Status: She is alert and oriented to person, place, and time.  Psychiatric:        Behavior: Behavior normal.    Appointment on 12/19/2020  Component Date Value Ref Range Status  . Sodium 12/19/2020 139  135 - 145 mmol/L Final  . Potassium 12/19/2020 3.5  3.5 - 5.1 mmol/L Final  . Chloride 12/19/2020 105  98 - 111 mmol/L Final  . CO2 12/19/2020 25  22 - 32 mmol/L Final  . Glucose, Bld 12/19/2020 90  70 - 99 mg/dL Final   Glucose reference range applies only to samples taken after fasting for at least 8 hours.  . BUN 12/19/2020 14  6 - 20 mg/dL Final  . Creatinine, Ser 12/19/2020 0.73  0.44 - 1.00 mg/dL Final  . Calcium 12/19/2020 9.4  8.9 - 10.3 mg/dL Final  . Total Protein 12/19/2020 7.4  6.5 - 8.1 g/dL Final  . Albumin 12/19/2020 4.1  3.5 - 5.0 g/dL Final  . AST 12/19/2020 13* 15 - 41 U/L Final  . ALT 12/19/2020 11  0 - 44 U/L Final  . Alkaline Phosphatase 12/19/2020 70  38 - 126 U/L Final  . Total Bilirubin 12/19/2020 0.4  0.3 - 1.2 mg/dL Final  . GFR, Estimated 12/19/2020 >60  >60 mL/min Final   Comment: (NOTE) Calculated using the CKD-EPI Creatinine Equation (2021)   . Anion  gap 12/19/2020 9  5 - 15 Final    Performed at [facility], [address] [place], [place] [address]  . WBC 12/19/2020 5.6  4.0 - 10.5 K/uL Final  . RBC 12/19/2020 3.92  3.87 - 5.11 MIL/uL Final  . Hemoglobin 12/19/2020 11.1* 12.0 - 15.0 g/dL Final  . HCT 12/19/2020 34.6* 36.0 - 46.0 % Final  . MCV 12/19/2020 88.3  80.0 - 100.0 fL Final  . MCH 12/19/2020 28.3  26.0 - 34.0 pg Final  . MCHC 12/19/2020 32.1  30.0 - 36.0 g/dL Final  . RDW 12/19/2020 13.0  11.5 - 15.5 % Final  . Platelets 12/19/2020 321  150 - 400 K/uL Final  . nRBC 12/19/2020 0.0  0.0 - 0.2 % Final  . Neutrophils Relative % 12/19/2020 58  % Final  . Neutro Abs 12/19/2020 3.2  1.7 - 7.7 K/uL Final  . Lymphocytes Relative 12/19/2020 32  % Final  . Lymphs Abs 12/19/2020 1.8  0.7 - 4.0 K/uL Final  . Monocytes Relative 12/19/2020 7  % Final  . Monocytes Absolute 12/19/2020 0.4  0.1 - 1.0 K/uL Final  . Eosinophils Relative 12/19/2020 3  % Final  . Eosinophils Absolute 12/19/2020 0.1  0.0 - 0.5 K/uL Final  . Basophils Relative 12/19/2020 0  % Final  . Basophils Absolute 12/19/2020 0.0  0.0 - 0.1 K/uL Final  . Immature Granulocytes 12/19/2020 0  % Final  . Abs Immature Granulocytes 12/19/2020 0.02  0.00 - 0.07 K/uL Final   Performed at [facility], [address] [place], [place] [address]    Assessment:  Heather Bell is a 45 y.o. female with clinical T1cN1triple negativerightbreast cancers/p neoadjuvant chemotherapy followed by right total mastectomy, sentinel lymph node biopsy, and reconstruction on 03/21/2020.  Breast pathology was negative for carcinoma. There was duct ectasia and stromal fibrosis.  Sentinel lymph node and an additional lymph node were negative for carcinoma. Post treatment classification was [id number].  She initially underwent ultrasound guided core biopsy on 07/22/2019. Pathology revealeda grade IIIinvasive mammary carcinoma, no special type of the right breast. Specimen was 10 mm.Ductal carcinoma in  situ(DCIS)was not identified.Right axillarylymph nodewas positive for metastatic carcinoma.Tumor was ER - (<1%), PR - (<1%), and Her2/neu -.  Bilateral mammogram and right unilateral ultrasoundon 07/10/2019 revealed a 1.7 x 1.6 x 1.6 cmmass at the7 o'clock region of the right breast 7 cm from the nipple. There was an abnormal1.4 x 0.7 x 0.9 cmaxillary lymph node. Therewas a second3 mmaxillary lymph node with minimal focal cortical thickening.An ultrasound-guided core biopsies of the mass and the enlarged right axillary lymph node was recommended. BI-RADS category 5.  PET scanon 08/11/2019 showed a hypermetabolic mass in the inferior RIGHT breast consistentwith herbreast carcinoma. There was noevidence hypermetabolic nodal metastasis.There was no distant metastatic disease.There was nopulmonary metastasis or skeletal metastasis.  Echoon 12/24/2020revealedan ejection fraction of50 to 55%. The left ventricle demonstrated global hypokinesis.MUGAon 08/24/2019 revealed an EF of 60%.MUGAon 12/08/2019 revealed an EF of57.4%.  MUGA on 01/22/2020 revealed an EF of 56.5%.  She is followed by Dr End.  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  Shereceived 12 weeksneoadjuvant Taxol + carboplatin(08/25/2019 -11/24/2019) and 4 cycles of AC (12/15/2019 - 02/16/2020) with Fulphila (pegfilgrastim-jmdb) support.  She has required IVF support.  She underwent irrigation and debridement of wound with removal of right tissue expander on 05/02/2020. Left screening mammogram on 08/09/2020 revealed no  evidence of malignancy.  She is premenopausal. She stopped taking birth control pills on 08/17/2019.  Symptomatically, she feels okay.  Notes improvement of left shoulder pain with physical therapy.  Reports new onset bilateral knee pain.  She is interested in additional  physical therapy.  Scheduled to have tissue expanders replaced by Dr. Claudia Desanctis on 01/04/2021.  Exam is stable.  Plan: 1.    Labs today: CBC with diff, CMP, CA27.29 2. Clinical stageIIB triple negativeright breast cancer  She received neoadjuvant chemotherapy followed by right total mastectomy and sentinel lymph node biopsy.  Pathology revealed a complete pathologic remission.  She is s/p removal of right tissue expander.  Clinically, she continues to do well.   Exam reveals no evidence of recurrent disease.  Encourage monthly breast self-exam.  Left mammogram on 08/09/2020 revealed no evidence of malignancy.  She had a port removed by Dr. Su Monks is on 10/06/2020.  She is scheduled for breast reconstruction with tissue expanders by Dr. Claudia Desanctis on 01/04/2021. 3. Normocytic anemia labs from 12/19/2020 show hemoglobin of 11.1 (11.3)  Ferritin from 06/28/2020 was 28 with iron saturation 17%  She is currently not on oral iron supplements  She received IV Venofer last on 10/06/2019.  Her ferritin was 21 and iron saturation 7%.  Recommend additional IV Venofer x2.  Patient agreeable. 4. B12 deficiency Patient is on oral B12.  B12 was 240 on 01/19/2021and 1307 on 06/28/2020.  Folate was 66 on 06/28/2020.  Monitor annually.  Disposition: Schedule for 2 doses IV venofer.  Referral to Care Program. RTC in 3 months for MD assessment and labs (CBC with diff, CMP, CA27.29, Ferritin, Iron, B12, Folate).  I discussed the assessment and treatment plan with the patient.  The patient was provided an opportunity to ask questions and all were answered.  The patient agreed with the plan and demonstrated an understanding of the instructions.  The patient was advised to call back if the symptoms worsen or if the condition fails to improve as anticipated.  Greater than 50% was spent in counseling and coordination of care with this patient including but not limited to discussion of the  relevant topics above (See A&P) including, but not limited to diagnosis and management of acute and chronic medical conditions.   [name], NP 12/19/2020 10:01 AM

## 2020-12-20 ENCOUNTER — Encounter (HOSPITAL_BASED_OUTPATIENT_CLINIC_OR_DEPARTMENT_OTHER): Payer: Self-pay | Admitting: Plastic Surgery

## 2020-12-20 ENCOUNTER — Encounter: Payer: Self-pay | Admitting: Oncology

## 2020-12-20 ENCOUNTER — Other Ambulatory Visit: Payer: Self-pay

## 2020-12-20 LAB — CANCER ANTIGEN 27.29: CA 27.29: 10 U/mL (ref 0.0–38.6)

## 2020-12-21 ENCOUNTER — Inpatient Hospital Stay: Payer: BC Managed Care – PPO

## 2020-12-21 VITALS — BP 105/67 | HR 95 | Temp 96.5°F | Resp 16

## 2020-12-21 DIAGNOSIS — D509 Iron deficiency anemia, unspecified: Secondary | ICD-10-CM

## 2020-12-21 DIAGNOSIS — C50911 Malignant neoplasm of unspecified site of right female breast: Secondary | ICD-10-CM | POA: Diagnosis not present

## 2020-12-21 MED ORDER — IRON SUCROSE 20 MG/ML IV SOLN
200.0000 mg | Freq: Once | INTRAVENOUS | Status: AC
Start: 2020-12-21 — End: 2020-12-21
  Administered 2020-12-21: 200 mg via INTRAVENOUS
  Filled 2020-12-21: qty 10

## 2020-12-21 MED ORDER — SODIUM CHLORIDE 0.9 % IV SOLN: 200.0000 mg | Freq: Once | INTRAVENOUS | Status: DC

## 2020-12-21 MED ORDER — SODIUM CHLORIDE 0.9 % IV SOLN
Freq: Once | INTRAVENOUS | Status: AC
  Filled 2020-12-21: qty 250

## 2020-12-23 ENCOUNTER — Other Ambulatory Visit
Admission: RE | Admit: 2020-12-23 | Discharge: 2020-12-23 | Disposition: A | Discharge status: Home / Self Care | Payer: BC Managed Care – PPO | Source: Ambulatory Visit | Attending: Plastic Surgery | Admitting: Plastic Surgery

## 2020-12-23 ENCOUNTER — Other Ambulatory Visit: Payer: Self-pay

## 2020-12-23 DIAGNOSIS — Z20822 Contact with and (suspected) exposure to covid-19: Secondary | ICD-10-CM | POA: Diagnosis not present

## 2020-12-23 DIAGNOSIS — Z01812 Encounter for preprocedural laboratory examination: Secondary | ICD-10-CM | POA: Insufficient documentation

## 2020-12-23 LAB — SARS CORONAVIRUS 2 (TAT 6-24 HRS): SARS Coronavirus 2: NEGATIVE

## 2020-12-23 NOTE — Patient Instructions (Signed)
Pt will call for any concerns or questions Pt has her schedule for her Covid test / surgery / and her post-op visits

## 2020-12-27 ENCOUNTER — Ambulatory Visit (HOSPITAL_BASED_OUTPATIENT_CLINIC_OR_DEPARTMENT_OTHER): Payer: BC Managed Care – PPO | Admitting: Certified Registered"

## 2020-12-27 ENCOUNTER — Encounter (HOSPITAL_BASED_OUTPATIENT_CLINIC_OR_DEPARTMENT_OTHER): Payer: Self-pay | Admitting: Plastic Surgery

## 2020-12-27 ENCOUNTER — Ambulatory Visit (HOSPITAL_BASED_OUTPATIENT_CLINIC_OR_DEPARTMENT_OTHER)
Admission: RE | Admit: 2020-12-27 | Discharge: 2020-12-27 | Disposition: A | Discharge status: Home / Self Care | Payer: BC Managed Care – PPO | Attending: Plastic Surgery | Admitting: Plastic Surgery

## 2020-12-27 ENCOUNTER — Encounter (HOSPITAL_BASED_OUTPATIENT_CLINIC_OR_DEPARTMENT_OTHER)
Admission: RE | Disposition: A | Discharge status: Home / Self Care | Payer: Self-pay | Source: Home / Self Care | Attending: Plastic Surgery

## 2020-12-27 ENCOUNTER — Other Ambulatory Visit: Payer: Self-pay

## 2020-12-27 DIAGNOSIS — Z421 Encounter for breast reconstruction following mastectomy: Secondary | ICD-10-CM | POA: Insufficient documentation

## 2020-12-27 DIAGNOSIS — Z881 Allergy status to other antibiotic agents status: Secondary | ICD-10-CM | POA: Diagnosis not present

## 2020-12-27 DIAGNOSIS — Z8249 Family history of ischemic heart disease and other diseases of the circulatory system: Secondary | ICD-10-CM | POA: Insufficient documentation

## 2020-12-27 DIAGNOSIS — Z888 Allergy status to other drugs, medicaments and biological substances status: Secondary | ICD-10-CM | POA: Diagnosis not present

## 2020-12-27 DIAGNOSIS — Z9011 Acquired absence of right breast and nipple: Secondary | ICD-10-CM | POA: Diagnosis not present

## 2020-12-27 DIAGNOSIS — E119 Type 2 diabetes mellitus without complications: Secondary | ICD-10-CM | POA: Diagnosis not present

## 2020-12-27 DIAGNOSIS — C773 Secondary and unspecified malignant neoplasm of axilla and upper limb lymph nodes: Secondary | ICD-10-CM

## 2020-12-27 DIAGNOSIS — Z801 Family history of malignant neoplasm of trachea, bronchus and lung: Secondary | ICD-10-CM | POA: Diagnosis not present

## 2020-12-27 DIAGNOSIS — K219 Gastro-esophageal reflux disease without esophagitis: Secondary | ICD-10-CM | POA: Diagnosis not present

## 2020-12-27 DIAGNOSIS — C50911 Malignant neoplasm of unspecified site of right female breast: Secondary | ICD-10-CM

## 2020-12-27 DIAGNOSIS — Z9221 Personal history of antineoplastic chemotherapy: Secondary | ICD-10-CM | POA: Diagnosis not present

## 2020-12-27 DIAGNOSIS — Z853 Personal history of malignant neoplasm of breast: Secondary | ICD-10-CM | POA: Insufficient documentation

## 2020-12-27 DIAGNOSIS — Z885 Allergy status to narcotic agent status: Secondary | ICD-10-CM | POA: Insufficient documentation

## 2020-12-27 HISTORY — PX: BREAST RECONSTRUCTION WITH PLACEMENT OF TISSUE EXPANDER AND FLEX HD (ACELLULAR HYDRATED DERMIS): SHX6295

## 2020-12-27 LAB — GLUCOSE, CAPILLARY
Glucose-Capillary: 96 mg/dL (ref 70–99)
Glucose-Capillary: 136 mg/dL — ABNORMAL HIGH (ref 70–99)

## 2020-12-27 LAB — POCT PREGNANCY, URINE: Preg Test, Ur: NEGATIVE

## 2020-12-27 SURGERY — BREAST RECONSTRUCTION WITH PLACEMENT OF TISSUE EXPANDER AND FLEX HD (ACELLULAR HYDRATED DERMIS)
Anesthesia: General | Site: Breast | Laterality: Right

## 2020-12-27 MED ORDER — DROPERIDOL 2.5 MG/ML IJ SOLN: 0.6250 mg | Freq: Once | INTRAMUSCULAR | Status: DC | PRN

## 2020-12-27 MED ORDER — PROPOFOL 10 MG/ML IV BOLUS
INTRAVENOUS | Status: DC | PRN
  Administered 2020-12-27: 150 mg via INTRAVENOUS

## 2020-12-27 MED ORDER — HYDROMORPHONE HCL 1 MG/ML IJ SOLN
0.2500 mg | INTRAMUSCULAR | Status: DC | PRN
  Administered 2020-12-27: 0.25 mg via INTRAVENOUS

## 2020-12-27 MED ORDER — PROPOFOL 10 MG/ML IV BOLUS
INTRAVENOUS | Status: AC
  Filled 2020-12-27: qty 20

## 2020-12-27 MED ORDER — DEXAMETHASONE SODIUM PHOSPHATE 4 MG/ML IJ SOLN
INTRAMUSCULAR | Status: DC | PRN
  Administered 2020-12-27: 5 mg via INTRAVENOUS

## 2020-12-27 MED ORDER — ONDANSETRON HCL 4 MG/2ML IJ SOLN
INTRAMUSCULAR | Status: DC | PRN
  Administered 2020-12-27: 4 mg via INTRAVENOUS

## 2020-12-27 MED ORDER — BUPIVACAINE-EPINEPHRINE 0.25% -1:200000 IJ SOLN
INTRAMUSCULAR | Status: DC | PRN
  Administered 2020-12-27: 10 mL

## 2020-12-27 MED ORDER — PHENYLEPHRINE 40 MCG/ML (10ML) SYRINGE FOR IV PUSH (FOR BLOOD PRESSURE SUPPORT)
PREFILLED_SYRINGE | INTRAVENOUS | Status: AC
  Filled 2020-12-27: qty 20

## 2020-12-27 MED ORDER — LIDOCAINE HCL (CARDIAC) PF 100 MG/5ML IV SOSY
PREFILLED_SYRINGE | INTRAVENOUS | Status: DC | PRN
  Administered 2020-12-27: 60 mg via INTRAVENOUS

## 2020-12-27 MED ORDER — OXYCODONE HCL 5 MG PO TABS: 5.0000 mg | ORAL_TABLET | Freq: Once | ORAL | Status: DC | PRN

## 2020-12-27 MED ORDER — PHENYLEPHRINE 40 MCG/ML (10ML) SYRINGE FOR IV PUSH (FOR BLOOD PRESSURE SUPPORT)
PREFILLED_SYRINGE | INTRAVENOUS | Status: AC
  Filled 2020-12-27: qty 10

## 2020-12-27 MED ORDER — ROCURONIUM BROMIDE 100 MG/10ML IV SOLN
INTRAVENOUS | Status: DC | PRN
  Administered 2020-12-27: 70 mg via INTRAVENOUS

## 2020-12-27 MED ORDER — SUGAMMADEX SODIUM 200 MG/2ML IV SOLN
INTRAVENOUS | Status: DC | PRN
  Administered 2020-12-27: 200 mg via INTRAVENOUS

## 2020-12-27 MED ORDER — CEFAZOLIN SODIUM-DEXTROSE 2-4 GM/100ML-% IV SOLN
INTRAVENOUS | Status: AC
  Filled 2020-12-27: qty 100

## 2020-12-27 MED ORDER — CEFAZOLIN SODIUM-DEXTROSE 2-4 GM/100ML-% IV SOLN
2.0000 g | INTRAVENOUS | Status: AC
  Administered 2020-12-27: 2 g via INTRAVENOUS

## 2020-12-27 MED ORDER — EPHEDRINE 5 MG/ML INJ
INTRAVENOUS | Status: AC
  Filled 2020-12-27: qty 10

## 2020-12-27 MED ORDER — OXYCODONE HCL 5 MG/5ML PO SOLN: 5.0000 mg | Freq: Once | ORAL | Status: DC | PRN

## 2020-12-27 MED ORDER — DEXMEDETOMIDINE (PRECEDEX) IN NS 20 MCG/5ML (4 MCG/ML) IV SYRINGE
PREFILLED_SYRINGE | INTRAVENOUS | Status: AC
  Filled 2020-12-27: qty 5

## 2020-12-27 MED ORDER — LACTATED RINGERS IV SOLN: INTRAVENOUS | Status: DC

## 2020-12-27 MED ORDER — HYDROMORPHONE HCL 1 MG/ML IJ SOLN
INTRAMUSCULAR | Status: AC
  Filled 2020-12-27: qty 0.5

## 2020-12-27 MED ORDER — SODIUM CHLORIDE 0.9 % IV SOLN
INTRAVENOUS | Status: AC
  Filled 2020-12-27: qty 10

## 2020-12-27 MED ORDER — FENTANYL CITRATE (PF) 100 MCG/2ML IJ SOLN
INTRAMUSCULAR | Status: DC | PRN
  Administered 2020-12-27: 50 ug via INTRAVENOUS

## 2020-12-27 MED ORDER — MIDAZOLAM HCL 5 MG/5ML IJ SOLN
INTRAMUSCULAR | Status: DC | PRN
  Administered 2020-12-27: 2 mg via INTRAVENOUS

## 2020-12-27 MED ORDER — BUPIVACAINE-EPINEPHRINE (PF) 0.25% -1:200000 IJ SOLN
INTRAMUSCULAR | Status: AC
  Filled 2020-12-27: qty 30

## 2020-12-27 MED ORDER — SODIUM CHLORIDE 0.9 % IV SOLN: INTRAVENOUS | Status: DC | PRN

## 2020-12-27 MED ORDER — MIDAZOLAM HCL 2 MG/2ML IJ SOLN
INTRAMUSCULAR | Status: AC
  Filled 2020-12-27: qty 2

## 2020-12-27 MED ORDER — HYDROCODONE-ACETAMINOPHEN 5-325 MG PO TABS: 1.0000 | ORAL_TABLET | Freq: Four times a day (QID) | ORAL | 0 refills | Status: DC | PRN

## 2020-12-27 MED ORDER — SULFAMETHOXAZOLE-TRIMETHOPRIM 800-160 MG PO TABS: 1.0000 | ORAL_TABLET | Freq: Two times a day (BID) | ORAL | 0 refills | Status: DC

## 2020-12-27 MED ORDER — MEPERIDINE HCL 25 MG/ML IJ SOLN: 6.2500 mg | INTRAMUSCULAR | Status: DC | PRN

## 2020-12-27 MED ORDER — ONDANSETRON HCL 4 MG PO TABS: 4.0000 mg | ORAL_TABLET | Freq: Three times a day (TID) | ORAL | 0 refills | Status: DC | PRN

## 2020-12-27 MED ORDER — FENTANYL CITRATE (PF) 100 MCG/2ML IJ SOLN
INTRAMUSCULAR | Status: AC
  Filled 2020-12-27: qty 2

## 2020-12-27 SURGICAL SUPPLY — 73 items
APL PRP STRL LF DISP 70% ISPRP (MISCELLANEOUS) ×2
APL SKNCLS STERI-STRIP NONHPOA (GAUZE/BANDAGES/DRESSINGS) ×2
BAG DECANTER FOR FLEXI CONT (MISCELLANEOUS) ×2 IMPLANT
BENZOIN TINCTURE PRP APPL 2/3 (GAUZE/BANDAGES/DRESSINGS) ×4 IMPLANT
BIOPATCH RED 1 DISK 7.0 (GAUZE/BANDAGES/DRESSINGS) ×2 IMPLANT
BLADE SURG 10 STRL SS (BLADE) ×2 IMPLANT
BLADE SURG 15 STRL LF DISP TIS (BLADE) IMPLANT
BLADE SURG 15 STRL SS (BLADE)
BNDG ELASTIC 6X5.8 VLCR STR LF (GAUZE/BANDAGES/DRESSINGS) ×2 IMPLANT
BNDG GAUZE ELAST 4 BULKY (GAUZE/BANDAGES/DRESSINGS) IMPLANT
CANISTER SUCT 1200ML W/VALVE (MISCELLANEOUS) ×2 IMPLANT
CHLORAPREP W/TINT 26 (MISCELLANEOUS) ×4 IMPLANT
COVER BACK TABLE 60X90IN (DRAPES) ×2 IMPLANT
COVER MAYO STAND STRL (DRAPES) ×2 IMPLANT
COVER WAND RF STERILE (DRAPES) IMPLANT
DECANTER SPIKE VIAL GLASS SM (MISCELLANEOUS) ×2 IMPLANT
DRAIN CHANNEL 15F RND FF W/TCR (WOUND CARE) ×2 IMPLANT
DRAPE LAPAROSCOPIC ABDOMINAL (DRAPES) ×2 IMPLANT
DRAPE TOP ARMCOVERS (MISCELLANEOUS) IMPLANT
DRAPE UTILITY XL STRL (DRAPES) ×2 IMPLANT
DRSG PAD ABDOMINAL 8X10 ST (GAUZE/BANDAGES/DRESSINGS) ×4 IMPLANT
DRSG TEGADERM 2-3/8X2-3/4 SM (GAUZE/BANDAGES/DRESSINGS) ×2 IMPLANT
DRSG TEGADERM 4X10 (GAUZE/BANDAGES/DRESSINGS) IMPLANT
DRSG TEGADERM 4X4.75 (GAUZE/BANDAGES/DRESSINGS) IMPLANT
ELECT BLADE 4.0 EZ CLEAN MEGAD (MISCELLANEOUS)
ELECT COATED BLADE 2.86 ST (ELECTRODE) IMPLANT
ELECT REM PT RETURN 9FT ADLT (ELECTROSURGICAL) ×2
ELECTRODE BLDE 4.0 EZ CLN MEGD (MISCELLANEOUS) IMPLANT
ELECTRODE REM PT RTRN 9FT ADLT (ELECTROSURGICAL) ×1 IMPLANT
EVACUATOR SILICONE 100CC (DRAIN) ×2 IMPLANT
FUNNEL KELLER 2 DISP (MISCELLANEOUS) IMPLANT
GAUZE SPONGE 4X4 12PLY STRL (GAUZE/BANDAGES/DRESSINGS) ×2 IMPLANT
GLOVE SRG 8 PF TXTR STRL LF DI (GLOVE) IMPLANT
GLOVE SURG ENC MOIS LTX SZ7 (GLOVE) ×2 IMPLANT
GLOVE SURG ENC MOIS LTX SZ7.5 (GLOVE) IMPLANT
GLOVE SURG ENC TEXT LTX SZ7.5 (GLOVE) ×4 IMPLANT
GLOVE SURG UNDER POLY LF SZ8 (GLOVE)
GOWN STRL REUS W/ TWL LRG LVL3 (GOWN DISPOSABLE) ×3 IMPLANT
GOWN STRL REUS W/TWL LRG LVL3 (GOWN DISPOSABLE) ×6
GRAFT FLEX HD 19X22X0.7-1.4 (Tissue) ×2 IMPLANT
IMPL BREAST 300CC (Breast) ×1 IMPLANT
IMPLANT BREAST 300CC (Breast) ×2 IMPLANT
IV NS 500ML (IV SOLUTION) ×2
IV NS 500ML BAXH (IV SOLUTION) ×1 IMPLANT
KIT FILL SYSTEM UNIVERSAL (SET/KITS/TRAYS/PACK) ×2 IMPLANT
MARKER SKIN DUAL TIP RULER LAB (MISCELLANEOUS) IMPLANT
NEEDLE HYPO 25X1 1.5 SAFETY (NEEDLE) ×2 IMPLANT
PACK BASIN DAY SURGERY FS (CUSTOM PROCEDURE TRAY) ×2 IMPLANT
PENCIL SMOKE EVACUATOR (MISCELLANEOUS) ×2 IMPLANT
PIN SAFETY STERILE (MISCELLANEOUS) ×2 IMPLANT
RETRACTOR ONETRAX LX 135X30 (MISCELLANEOUS) ×2 IMPLANT
SHEET MEDIUM DRAPE 40X70 STRL (DRAPES) ×8 IMPLANT
SLEEVE SCD COMPRESS KNEE MED (STOCKING) ×2 IMPLANT
SPONGE LAP 18X18 RF (DISPOSABLE) ×4 IMPLANT
STAPLER VISISTAT 35W (STAPLE) ×2 IMPLANT
STRIP CLOSURE SKIN 1/2X4 (GAUZE/BANDAGES/DRESSINGS) IMPLANT
STRIP SUTURE WOUND CLOSURE 1/2 (MISCELLANEOUS) ×2 IMPLANT
SUT ETHILON 2 0 FS 18 (SUTURE) ×2 IMPLANT
SUT MON AB 3-0 SH 27 (SUTURE)
SUT MON AB 3-0 SH27 (SUTURE) IMPLANT
SUT MON AB 4-0 PC3 18 (SUTURE) IMPLANT
SUT PDS 3-0 CT2 (SUTURE) ×8
SUT PDS II 3-0 CT2 27 ABS (SUTURE) ×4 IMPLANT
SUT PLAIN 5 0 P 3 18 (SUTURE) IMPLANT
SUT VLOC 90 P-14 23 (SUTURE) ×4 IMPLANT
SYR BULB IRRIG 60ML STRL (SYRINGE) ×2 IMPLANT
SYR CONTROL 10ML LL (SYRINGE) IMPLANT
TAPE MEASURE VINYL STERILE (MISCELLANEOUS) IMPLANT
TOWEL GREEN STERILE FF (TOWEL DISPOSABLE) ×4 IMPLANT
TRAY FOLEY W/BAG SLVR 14FR LF (SET/KITS/TRAYS/PACK) IMPLANT
TUBE CONNECTING 20X1/4 (TUBING) ×2 IMPLANT
UNDERPAD 30X36 HEAVY ABSORB (UNDERPADS AND DIAPERS) ×4 IMPLANT
YANKAUER SUCT BULB TIP NO VENT (SUCTIONS) ×2 IMPLANT

## 2020-12-27 NOTE — Interval H&P Note (Signed)
History and Physical Interval Note:  12/27/2020 9:54 AM  Heather Bell  has presented today for surgery, with the diagnosis of Right breast cancer metastasized to axillary lymph node.  The various methods of treatment have been discussed with the patient and family. After consideration of risks, benefits and other options for treatment, the patient has consented to  Procedure(s): BREAST RECONSTRUCTION WITH PLACEMENT OF TISSUE EXPANDER AND FLEX HD (ACELLULAR HYDRATED DERMIS) (Right) as a surgical intervention.  The patient's history has been reviewed, patient examined, no change in status, stable for surgery.  I have reviewed the patient's chart and labs.  Questions were answered to the patient's satisfaction.     [name]

## 2020-12-27 NOTE — Discharge Instructions (Signed)
Activity: As tolerated, but avoid strenuous activity until follow up visit.  Diet: Regular  Wound Care: Keep dressing clean & dry for 2 days.  After that you can shower normally.  Redress the wound as needed for comfort.  Recommend gentle compression with ACE wrap until follow up.  Special Instructions:  Call our office if any unusual problems occur such as pain, excessive bleeding, unrelieved nausea/vomiting, fever &/or chills.  Follow-up appointment: Scheduled for next week.  Post Anesthesia Home Care Instructions  Activity: Get plenty of rest for the remainder of the day. A responsible individual must stay with you for 24 hours following the procedure.  For the next 24 hours, DO NOT: -Drive a car -Paediatric nurse -Drink alcoholic beverages -Take any medication unless instructed by your physician -Make any legal decisions or sign important papers.  Meals: Start with liquid foods such as gelatin or soup. Progress to regular foods as tolerated. Avoid greasy, spicy, heavy foods. If nausea and/or vomiting occur, drink only clear liquids until the nausea and/or vomiting subsides. Call your physician if vomiting continues.  Special Instructions/Symptoms: Your throat may feel dry or sore from the anesthesia or the breathing tube placed in your throat during surgery. If this causes discomfort, gargle with warm salt water. The discomfort should disappear within 24 hours.  If you had a scopolamine patch placed behind your ear for the management of post- operative nausea and/or vomiting:  1. The medication in the patch is effective for 72 hours, after which it should be removed.  Wrap patch in a tissue and discard in the trash. Wash hands thoroughly with soap and water. 2. You may remove the patch earlier than 72 hours if you experience unpleasant side effects which may include dry mouth, dizziness or visual disturbances. 3. Avoid touching the patch. Wash your hands with soap and water  after contact with the patch.    About my Jackson-Pratt Bulb Drain  What is a Jackson-Pratt bulb? A Jackson-Pratt is a soft, round device used to collect drainage. It is connected to a Soliday, thin drainage catheter, which is held in place by one or two small stiches near your surgical incision site. When the bulb is squeezed, it forms a vacuum, forcing the drainage to empty into the bulb.  Emptying the Jackson-Pratt bulb- To empty the bulb: 1. Release the plug on the top of the bulb. 2. Pour the bulb's contents into a measuring container which your nurse will provide. 3. Record the time emptied and amount of drainage. Empty the drain(s) as often as your     doctor or nurse recommends.  Date                  Time                    Amount (Drain 1)                 Amount (Drain 2)  _____________________________________________________________________  _____________________________________________________________________  _____________________________________________________________________  _____________________________________________________________________  _____________________________________________________________________  _____________________________________________________________________  _____________________________________________________________________  _____________________________________________________________________  Squeezing the Jackson-Pratt Bulb- To squeeze the bulb: 1. Make sure the plug at the top of the bulb is open. 2. Squeeze the bulb tightly in your fist. You will hear air squeezing from the bulb. 3. Replace the plug while the bulb is squeezed. 4. Use a safety pin to attach the bulb to your clothing. This will keep the catheter from     pulling at the bulb insertion site.  When to call your doctor- Call your doctor if:  Drain site becomes red, swollen or hot.  You have a fever greater than 101 degrees F.  There is oozing at the drain  site.  Drain falls out (apply a guaze bandage over the drain hole and secure it with tape).  Drainage increases daily not related to activity patterns. (You will usually have more drainage when you are active than when you are resting.)  Drainage has a bad odor.

## 2020-12-27 NOTE — Anesthesia Procedure Notes (Signed)
Procedure Name: Intubation Date/Time: 12/27/2020 12:41 PM Performed by: Signe Colt, CRNA Pre-anesthesia Checklist: Patient identified, Emergency Drugs available, Suction available and Patient being monitored Patient Re-evaluated:Patient Re-evaluated prior to induction Oxygen Delivery Method: Circle system utilized Preoxygenation: Pre-oxygenation with 100% oxygen Induction Type: IV induction Ventilation: Mask ventilation without difficulty Laryngoscope Size: Mac and 3 Grade View: Grade I Tube type: Oral Tube size: 7.0 mm Number of attempts: 1 Airway Equipment and Method: Stylet and Oral airway Placement Confirmation: ETT inserted through vocal cords under direct vision,  positive ETCO2 and breath sounds checked- equal and bilateral Secured at: 21 cm Tube secured with: Tape Dental Injury: Teeth and Oropharynx as per pre-operative assessment

## 2020-12-27 NOTE — Anesthesia Postprocedure Evaluation (Signed)
Anesthesia Post Note  Patient: Heather Bell  Procedure(s) Performed: BREAST RECONSTRUCTION WITH PLACEMENT OF TISSUE EXPANDER AND FLEX HD (ACELLULAR HYDRATED DERMIS) (Right Breast)     Patient location during evaluation: PACU Anesthesia Type: General Level of consciousness: awake and alert Pain management: pain level controlled Vital Signs Assessment: post-procedure vital signs reviewed and stable Respiratory status: spontaneous breathing, nonlabored ventilation and respiratory function stable Cardiovascular status: blood pressure returned to baseline and stable Postop Assessment: no apparent nausea or vomiting Anesthetic complications: no   No complications documented.  Last Vitals:  Vitals:   12/27/20 1445 12/27/20 1500  BP: 132/89 136/89  Pulse: 95 96  Resp: (!) 22 16  Temp:  36.6 C  SpO2: 98% 98%    Last Pain:  Vitals:   12/27/20 1500  TempSrc:   PainSc: [place]

## 2020-12-27 NOTE — Brief Op Note (Signed)
12/27/2020  2:11 PM  PATIENT:  Heather Bell  45 y.o. female  PRE-OPERATIVE DIAGNOSIS:  Right breast cancer metastasized to axillary lymph node  POST-OPERATIVE DIAGNOSIS:  Right breast cancer metastasized to axillary lymph node  PROCEDURE:  Procedure(s): BREAST RECONSTRUCTION WITH PLACEMENT OF TISSUE EXPANDER AND FLEX HD (ACELLULAR HYDRATED DERMIS) (Right)  SURGEON:  Surgeon(s) and Role:    * Quynn Vilchis, Steffanie Dunn, MD - Primary  PHYSICIAN ASSISTANT: Bonita Cox, RNFA  ASSISTANTS: none   ANESTHESIA:   general  EBL:  10   BLOOD ADMINISTERED:none  DRAINS: (1) Jackson-Pratt drain(s) with closed bulb suction in the right chest   LOCAL MEDICATIONS USED:  MARCAINE     SPECIMEN:  Source of Specimen:  right mastectomy scar  DISPOSITION OF SPECIMEN:  PATHOLOGY  COUNTS:  YES  TOURNIQUET:  * No tourniquets in log *  DICTATION: .Dragon Dictation  PLAN OF CARE: Discharge to home after PACU  PATIENT DISPOSITION:  PACU - hemodynamically stable.   Delay start of Pharmacological VTE agent (>24hrs) due to surgical blood loss or risk of bleeding: not applicable

## 2020-12-27 NOTE — Transfer of Care (Signed)
Immediate Anesthesia Transfer of Care Note  Patient: Heather Bell  Procedure(s) Performed: BREAST RECONSTRUCTION WITH PLACEMENT OF TISSUE EXPANDER AND FLEX HD (ACELLULAR HYDRATED DERMIS) (Right Breast)  Patient Location: PACU  Anesthesia Type:General  Level of Consciousness: awake, alert , oriented and patient cooperative  Airway & Oxygen Therapy: Patient Spontanous Breathing and Patient connected to face mask oxygen  Post-op Assessment: Report given to RN and Post -op Vital signs reviewed and stable  Post vital signs: Reviewed and stable  Last Vitals:  Vitals Value Taken Time  BP 117/77 12/27/20 1400  Temp    Pulse 103 12/27/20 1402  Resp    SpO2 100 % 12/27/20 1402  Vitals shown include unvalidated device data.  Last Pain:  Vitals:   12/27/20 0825  TempSrc: Oral  PainSc: 0-No pain         Complications: No complications documented.

## 2020-12-27 NOTE — Op Note (Signed)
Operative Note   DATE OF OPERATION: 12/27/2020  SURGICAL DEPARTMENT: Plastic Surgery  PREOPERATIVE DIAGNOSES: History of right breast cancer  POSTOPERATIVE DIAGNOSES:  same  PROCEDURE: Delayed right breast reconstruction with tissue expander and acellular dermal matrix  SURGEON: Talmadge Coventry, MD  ASSISTANT: [place], RNFA The advanced practice practitioner (APP) assisted throughout the case.  The APP was essential in retraction and counter traction when needed to make the case progress smoothly.  This retraction and assistance made it possible to see the tissue plans for the procedure.  The assistance was needed for blood control, tissue re-approximation and assisted with closure of the incision site.  ANESTHESIA:  General.   COMPLICATIONS: None.   INDICATIONS FOR PROCEDURE:  The patient, Heather Bell is a 45 y.o. female born on May 17, 1976, is here for treatment of right mastectomy defect.  She initially had immediate reconstruction after right mastectomy but had significant mastectomy flap skin necrosis so the expander was removed and she was given several months to heal and presents for another attempt at reconstruction. MRN: [id number]  CONSENT:  Informed consent was obtained directly from the patient. Risks, benefits and alternatives were fully discussed. Specific risks including but not limited to bleeding, infection, hematoma, seroma, scarring, pain, contracture, asymmetry, wound healing problems, and need for further surgery were all discussed. The patient did have an ample opportunity to have questions answered to satisfaction.   DESCRIPTION OF PROCEDURE:  The patient was taken to the operating room. SCDs were placed and antibiotics were given. General anesthesia was administered.  The patient's operative site was prepped and draped in a sterile fashion. A time out was performed and all information was confirmed to be correct.  I started by excising her previous right chest  scar.  This was just a couple millimeters wide and it was sent to pathology.  I then used cautery to dissect down to the pectoralis muscle.  Subcutaneous pocket was developed above the pectoralis circumferentially until adequate size pocket was obtained.  JP drain was then placed and brought out laterally and secured with a nylon suture.  I then brought the acellular dermal matrix onto the field.  This was a piece of Flex HD pliable pre-.  This was soaked in saline for several minutes and then a 3-0 PDS was run around the periphery as a pursestring.  The expander was then brought onto the field.  This was a Media planner tissue expander with a 300 cc volume.  The serial number is [id number].  The air was removed and the expander was placed within the matrix and tied down with the pursestring.  Suture tabs were brought out inferiorly laterally and medially.  3-0 PDS stay sutures were placed to the chest wall the corresponding locations and these were then run through the suture tabs.  I did confirm meticulous hemostasis prior to placement of the expander along with irrigated with triple antibiotic solution.  The expander with the surrounding matrix was then placed within the pocket and the stay sutures were tied down to secure it in position.  I did not inflate the tissue expander as this was delayed and there was not much extra skin.  Skin was closed in layers with interrupted buried 3-0 PDS, Enzor stapler and a running 3 oh V-Loc suture.  Steri-Strips and a soft compressive dressing were then applied.  The patient tolerated the procedure well.  There were no complications. The patient was allowed to wake from anesthesia, extubated and  taken to the recovery room in satisfactory condition.

## 2020-12-27 NOTE — Anesthesia Preprocedure Evaluation (Signed)
Anesthesia Evaluation  Patient identified by MRN, date of birth, ID band Patient awake    Reviewed: Allergy & Precautions, NPO status , Patient's Chart, lab work & pertinent test results  Airway Mallampati: II  TM Distance: >3 FB Neck ROM: Full    Dental no notable dental hx. (+) Teeth Intact, Dental Advisory Given   Pulmonary neg pulmonary ROS,    Pulmonary exam normal breath sounds clear to auscultation       Cardiovascular Exercise Tolerance: Good Normal cardiovascular exam Rhythm:Regular Rate:Normal  08/13/19 Echo Left Ventricle: Left ventricular ejection fraction, by visual estimation,  is 50 to 55%. The left ventricle has normal function. The left ventricle  demonstrates global hypokinesis. There is no left ventricular hypertrophy.  Normal left atrial pressure   Neuro/Psych PSYCHIATRIC DISORDERS Bipolar Disorder negative neurological ROS     GI/Hepatic Neg liver ROS, GERD  Medicated and Controlled,  Endo/Other  diabetes, Well Controlled, Type 2, Oral Hypoglycemic Agents  Renal/GU negative Renal ROSFrom 04/05/20 K+ 3.5 Cr 0.73      Musculoskeletal   Abdominal   Peds  Hematology  (+) anemia , From 8/17 Hgb 9.5   Anesthesia Other Findings Breast CA  Reproductive/Obstetrics negative OB ROS                             Anesthesia Physical  Anesthesia Plan  ASA: II  Anesthesia Plan: General   Post-op Pain Management:    Induction: Intravenous  PONV Risk Score and Plan: 3 and Treatment may vary due to age or medical condition, Ondansetron, Dexamethasone and Midazolam  Airway Management Planned: LMA  Additional Equipment: None  Intra-op Plan:   Post-operative Plan: Extubation in OR  Informed Consent: I have reviewed the patients History and Physical, chart, labs and discussed the procedure including the risks, benefits and alternatives for the proposed anesthesia with the  patient or authorized representative who has indicated his/her understanding and acceptance.     Dental advisory given  Plan Discussed with: CRNA and Anesthesiologist  Anesthesia Plan Comments:         Anesthesia Quick Evaluation

## 2020-12-28 ENCOUNTER — Encounter (HOSPITAL_BASED_OUTPATIENT_CLINIC_OR_DEPARTMENT_OTHER): Payer: Self-pay | Admitting: Plastic Surgery

## 2020-12-28 LAB — SURGICAL PATHOLOGY

## 2021-01-02 ENCOUNTER — Ambulatory Visit: Payer: BC Managed Care – PPO | Admitting: Physical Therapy

## 2021-01-03 ENCOUNTER — Other Ambulatory Visit: Payer: Self-pay | Admitting: Oncology

## 2021-01-03 ENCOUNTER — Inpatient Hospital Stay: Payer: BC Managed Care – PPO

## 2021-01-03 ENCOUNTER — Other Ambulatory Visit: Payer: Self-pay

## 2021-01-03 VITALS — BP 124/77 | HR 104 | Temp 97.9°F | Resp 18

## 2021-01-03 DIAGNOSIS — M25562 Pain in left knee: Secondary | ICD-10-CM

## 2021-01-03 DIAGNOSIS — M25561 Pain in right knee: Secondary | ICD-10-CM

## 2021-01-03 DIAGNOSIS — C50911 Malignant neoplasm of unspecified site of right female breast: Secondary | ICD-10-CM | POA: Diagnosis not present

## 2021-01-03 DIAGNOSIS — D509 Iron deficiency anemia, unspecified: Secondary | ICD-10-CM

## 2021-01-03 MED ORDER — IRON SUCROSE 20 MG/ML IV SOLN
200.0000 mg | Freq: Once | INTRAVENOUS | Status: AC
  Administered 2021-01-03: 200 mg via INTRAVENOUS
  Filled 2021-01-03: qty 10

## 2021-01-03 MED ORDER — SODIUM CHLORIDE 0.9 % IV SOLN
Freq: Once | INTRAVENOUS | Status: AC
Start: 2021-01-03 — End: 2021-01-03
  Filled 2021-01-03: qty 250

## 2021-01-03 MED ORDER — SODIUM CHLORIDE 0.9 % IV SOLN: 200.0000 mg | Freq: Once | INTRAVENOUS | Status: DC

## 2021-01-04 ENCOUNTER — Other Ambulatory Visit: Payer: Self-pay

## 2021-01-04 ENCOUNTER — Ambulatory Visit (INDEPENDENT_AMBULATORY_CARE_PROVIDER_SITE_OTHER): Payer: BC Managed Care – PPO | Admitting: Plastic Surgery

## 2021-01-04 DIAGNOSIS — C50911 Malignant neoplasm of unspecified site of right female breast: Secondary | ICD-10-CM

## 2021-01-04 DIAGNOSIS — C773 Secondary and unspecified malignant neoplasm of axilla and upper limb lymph nodes: Secondary | ICD-10-CM

## 2021-01-04 NOTE — Progress Notes (Signed)
Patient presents 1 week postop from delayed insertion of her right breast tissue expander.  She feels good overall.  Drain output is dropped off to 10 to 20 cc for the past 3 days so it was removed.  Her skin looks to be healing fine with no issues.  We will plan to see her again in 2 weeks to start the filling process.  All of her questions were answered.

## 2021-01-06 ENCOUNTER — Other Ambulatory Visit: Payer: Self-pay

## 2021-01-06 ENCOUNTER — Encounter: Payer: Self-pay | Admitting: Physical Therapy

## 2021-01-06 ENCOUNTER — Ambulatory Visit: Payer: BC Managed Care – PPO | Attending: Sports Medicine | Admitting: Physical Therapy

## 2021-01-06 DIAGNOSIS — G8929 Other chronic pain: Secondary | ICD-10-CM | POA: Diagnosis present

## 2021-01-06 DIAGNOSIS — M25512 Pain in left shoulder: Secondary | ICD-10-CM | POA: Diagnosis present

## 2021-01-06 DIAGNOSIS — M25612 Stiffness of left shoulder, not elsewhere classified: Secondary | ICD-10-CM | POA: Insufficient documentation

## 2021-01-06 NOTE — Therapy (Signed)
[facility] [address] [place], [address] Phone: [phone]   Fax:  [phone]  Physical Therapy Treatment  Patient Details  Name: Heather Bell MRN: [id number] Date of Birth: June 24, 1976 No data recorded  Encounter Date: 01/06/2021   PT End of Session - 01/06/21 0854    Visit Number 41    Number of Visits 50    Date for PT Re-Evaluation 02/10/21    Authorization - Visit Number 24    Authorization - Number of Visits 25    PT Start Time 0850    PT Stop Time 0928    PT Time Calculation (min) 38 min    Activity Tolerance Patient tolerated treatment well;Patient limited by pain    Behavior During Therapy [facility] for tasks assessed/performed           Past Medical History:  Diagnosis Date  . Allergic rhinitis   . Anxiety   . Bipolar disorder ([place])    depression  . Breast cancer ([place]) 03/21/2020   Right mastectomy  . Cancer [facility])    breast right  . Depression   . Diabetes ([place])   . GERD (gastroesophageal reflux disease)   . Hyperlipidemia   . Personal history of chemotherapy    Jan 2021 through July 2021    Past Surgical History:  Procedure Laterality Date  . BREAST BIOPSY Right 07/22/2019   mass bx, path pending, heart marker  . BREAST BIOPSY Right 07/22/2019   LN bx, path pending,  butterfly hydromarker  . BREAST RECONSTRUCTION WITH PLACEMENT OF TISSUE EXPANDER AND FLEX HD (ACELLULAR HYDRATED DERMIS) Right 03/21/2020   Procedure: RIGHT BREAST RECONSTRUCTION WITH PLACEMENT OF TISSUE EXPANDER AND FLEX HD (ACELLULAR HYDRATED DERMIS);  Surgeon: [name], MD;  Location: [facility];  Service: Plastics;  Laterality: Right;  . BREAST RECONSTRUCTION WITH PLACEMENT OF TISSUE EXPANDER AND FLEX HD (ACELLULAR HYDRATED DERMIS) Right 12/27/2020   Procedure: BREAST RECONSTRUCTION WITH PLACEMENT OF TISSUE EXPANDER AND FLEX HD (ACELLULAR HYDRATED DERMIS);  Surgeon: [name], MD;  Location: [place];  Service: Plastics;  Laterality: Right;  . IMAGE GUIDED SINUS SURGERY    . INCISION AND DRAINAGE OF WOUND Right 05/02/2020   Procedure: IRRIGATION AND DEBRIDEMENT WOUND WITH REMOVAL OF RIGHT TISSUE EXPANDER;  Surgeon: [name], MD;  Location: [place];  Service: Plastics;  Laterality: Right;  1 hour please  . KNEE SURGERY    . MASTECTOMY Right 03/21/2020  . NASAL SEPTUM SURGERY    . PORTACATH PLACEMENT N/A 08/07/2019   Procedure: INSERTION PORT-A-CATH;  Surgeon: Herbert Pun, MD;  Location: [facility];  Service: General;  Laterality: N/A;  . TONSILLECTOMY    . TOTAL MASTECTOMY Right 03/21/2020   Procedure: TOTAL MASTECTOMY;  Surgeon: Herbert Pun, MD;  Location: [facility];  Service: General;  Laterality: Right;    There were no vitals filed for this visit.   Subjective Assessment - 01/06/21 0851    Subjective Patient reports no pain today, minimal pain since last visit. Reports minimal compliance with HEP. Had expander placed 12/27/20, has not had fill yet.    Pertinent History Patient is a 45 year old female with L shoulder pain that began after a L port-a-cath was inserted last December for chemotherapy treatments for R breast cancer. Pt has concluded chemo treatments, but per onocologist is to keep port for a "little while longer". Has had R masectomy and R expander placement  and removal (no current plans for reconstruction). Pt reports a steady increase in pain and loss of motion over time since the port was inserted. Pt has received cortisone shots in L shoulder for bursitis last July and stated it helps decrease pain for some time but did not last very Scarber. Pt had an expander put in on R side post masectomy last year but the tissue died over it and is thinking about getting another one. Pt has difficulty and pain with all movements and states only rest and no movement makes her L shoulder feel better. Pt scales the pain a 10/10 at worst and  1/10 at best when not moving it. Pt is sleeping on her back but depending on if she rolls over, her L shoulder can be painful in the AM. Patient denies any N/V, B&B, unexplained weight loss, night pain, fever, or night sweats.    Limitations Lifting;House hold activities    How [place] can you sit comfortably? unlimited    How [place] can you stand comfortably? unlimited    How [place] can you walk comfortably? unlimited    Patient Stated Goals decrease pain, be able to move LUE to PLOF    Pain Onset More than a month ago             [id number] Ther-Ex TRX overhead flexionwalkx15  TRX overhead abd walkx15  Squat with overhead press 15#3x 6/6/8 with good carry over following cuing for efficient technique  OMEGA chest, slight overhead press 20# 3x 6 with min cuing to "maintain shoulder blade contact to seat   Scaption 3# DB with scapulae against wall for TC 3x 8/7/6  Y position on wall 1kg ball bounces 15sec/25sec/30sec, with good carry over of initial set up   Elevated plank position with mat table alt shoulder taps 2x 12 with good carry over of demo and initial cuing  Alt overhead scaption in qped 2x 12 with good carry over of demo  Manual  FOLLOWING contract-relax into motion 10sec contract 20sec relax with better success in flex/abd/ER/IR                   PT Education - 01/06/21 0855    Education Details therex form/technique    Person(s) Educated Patient    Methods Explanation;Demonstration;Verbal cues    Comprehension Verbalized understanding;Returned demonstration;Verbal cues required            PT Short Term Goals - 09/27/20 1037      PT SHORT TERM GOAL #1   Title Patient will demonstrate independence with HEP to be able to decrease pain and improve L shoulder function    Baseline 07/25/20 HEP given; 09/27/20 completing 50% of the time    Time 4    Period Weeks    Status On-going             PT Steffey Term Goals - 09/27/20 1037      PT  H[place]sey TERM GOAL #1   Title Patient will decrease worst pain as reported on NPRS scale by 3 points to demonstrate a clinically significant reduction in pain.    Baseline 07/25/20 10/10 at worst; 09/27/20 4/10    Time 8    Period Weeks    Status Achieved      PT Cardarelli TERM GOAL #2   Title Patient will increase L shoulder AROM to be [facility] in order to complete ADLs and household activities without pain or difficulty    Baseline 07/25/20 flexion 109,  ER base of skull, IR S1, abd 45; 07/25/20 flexion 139, ER C7, IR L1, abd 110    Time 8    Period Weeks    Status Partially Met      PT Roper TERM GOAL #3   Title Patient will increase L shoulder MMT strength to 4/5 to be able to raise arm overhead to complete household chores and return to PLOF    Baseline 07/25/20 flexion, abd, ER, ext 2+ IR 3; 09/27/20 in availible range flex: 4+ abd: 4 ER: 4- IR: 4-    Time 8    Period Weeks    Status On-going      PT Cantrell TERM GOAL #4   Title Patient will increase FOTO score to 64 to demonstrate predicted increase in functional mobility to complete ADLs    Baseline 07/25/20 47 09/27/20 63    Time 8    Period Weeks    Status New                 Plan - 01/06/21 0913    Clinical Impression Statement PT continued therex progression for continued overhead strength and scapulohumeral rhythm with success. Patient is able to comply with all cuing for proper technique of therex with good motivaiton and no increased pain throughout session. Patient demonstrates excellent ROM this session, with continued ability to increase resistance for strengthening, with better undersatnding of scapulohumeral rhythm. PT will re-assess patient for d/c next visit.    Personal Factors and Comorbidities Comorbidity 1;Past/Current Experience;Time since onset of injury/illness/exacerbation;Fitness    Comorbidities R breast cancer    Examination-Activity Limitations Bathing;Sleep;Lift;Dressing;Reach Overhead;Hygiene/Grooming     Examination-Participation Restrictions Cleaning;Laundry;Yard Work;Community Activity    Stability/Clinical Decision Making Evolving/Moderate complexity    Clinical Decision Making Moderate    Rehab Potential Good    PT Frequency 2x / week    PT Treatment/Interventions ADLs/Self Care Home Management;Electrical Stimulation;Moist Heat;Iontophoresis 64m/ml Dexamethasone;Traction;Ultrasound;Functional mobility training;Therapeutic activities;Therapeutic exercise;Neuromuscular re-education;Patient/family education;Manual techniques;Passive range of motion;Dry needling;Scar mobilization;Joint Manipulations;Other (comment)    PT Next Visit Plan DISCHARGE    PT Home Exercise Plan [id number]    Consulted and Agree with Plan of Care Patient           Patient will benefit from skilled therapeutic intervention in order to improve the following deficits and impairments:  Decreased mobility,Hypomobility,Improper body mechanics,Impaired tone,Decreased range of motion,Decreased strength,Increased fascial restricitons,Impaired flexibility,Impaired UE functional use,Postural dysfunction,Pain  Visit Diagnosis: Stiffness of left shoulder, not elsewhere classified  Chronic left shoulder pain     Problem List Patient Active Problem List   Diagnosis Date Noted  . Breast cancer ([place] 03/21/2020  . Anemia due to antineoplastic chemotherapy 02/15/2020  . Cardiomyopathy ([place] 12/12/2019  . Dehydration 11/24/2019  . Acute pain of left shoulder 11/24/2019  . Orthostatic hypotension 10/12/2019  . Low serum cortisol level ([place] 09/29/2019  . Malignant neoplasm of breast in female, estrogen receptor negative ([facility] 09/28/2019  . Hypokalemia 09/15/2019  . Hypocalcemia 09/15/2019  . Normocytic anemia 09/15/2019  . B12 deficiency 09/15/2019  . Iron deficiency anemia 09/15/2019  . Encounter for antineoplastic chemotherapy 08/25/2019  . Goals of care, counseling/discussion 08/17/2019  . Weight loss 07/31/2019  .  Invasive carcinoma of breast ([facility] 07/30/2019  . H/O diarrhea 01/01/2017   CDurwin RegesDPT CDurwin Reges5/20/2022, 9:51 AM  [facility] [address] [place] [address]Phone: [phone]  Fax:  [phone] Name: Heather LINDBLOOMMRN: [id number]  Date of Birth: 1975-12-03

## 2021-01-10 ENCOUNTER — Telehealth: Payer: Self-pay | Admitting: Internal Medicine

## 2021-01-10 NOTE — Telephone Encounter (Signed)
Per nuc med order incorrectly entered for MUGA .  Please advise scheduling when ready to be linked.

## 2021-01-10 NOTE — Telephone Encounter (Signed)
Nm corrected ordered.  Nothing Further needed.  Closing encounter.

## 2021-01-19 ENCOUNTER — Other Ambulatory Visit: Payer: Self-pay | Admitting: Oncology

## 2021-01-19 DIAGNOSIS — M25562 Pain in left knee: Secondary | ICD-10-CM

## 2021-01-19 DIAGNOSIS — C50511 Malignant neoplasm of lower-outer quadrant of right female breast: Secondary | ICD-10-CM

## 2021-01-19 DIAGNOSIS — M25561 Pain in right knee: Secondary | ICD-10-CM

## 2021-01-19 DIAGNOSIS — Z171 Estrogen receptor negative status [ER-]: Secondary | ICD-10-CM

## 2021-01-24 ENCOUNTER — Encounter: Payer: Self-pay | Admitting: Physical Therapy

## 2021-01-24 ENCOUNTER — Other Ambulatory Visit: Payer: Self-pay

## 2021-01-24 ENCOUNTER — Ambulatory Visit: Payer: BC Managed Care – PPO | Attending: Sports Medicine | Admitting: Physical Therapy

## 2021-01-24 DIAGNOSIS — M25612 Stiffness of left shoulder, not elsewhere classified: Secondary | ICD-10-CM | POA: Diagnosis present

## 2021-01-24 DIAGNOSIS — G8929 Other chronic pain: Secondary | ICD-10-CM | POA: Diagnosis present

## 2021-01-24 DIAGNOSIS — M25512 Pain in left shoulder: Secondary | ICD-10-CM | POA: Diagnosis present

## 2021-01-24 NOTE — Therapy (Signed)
[facility] [address] [place], [address] Phone: [phone]   Fax:  [phone]  Physical Therapy Treatment/Discharge Summary  Reporting Period 09/25/20 - 01/24/21  Patient Details  Name: Heather Bell MRN: [id number] Date of Birth: 1975-12-07 No data recorded  Encounter Date: 01/24/2021   PT End of Session - 01/24/21 1008    Visit Number 42    Number of Visits 50    Date for PT Re-Evaluation 02/10/21    Authorization - Visit Number 25    Authorization - Number of Visits 25    PT Start Time 0935    PT Stop Time 1008    PT Time Calculation (min) 33 min    Activity Tolerance Patient tolerated treatment well;Patient limited by pain    Behavior During Therapy [facility] for tasks assessed/performed           Past Medical History:  Diagnosis Date  . Allergic rhinitis   . Anxiety   . Bipolar disorder ([place])    depression  . Breast cancer ([place]) 03/21/2020   Right mastectomy  . Cancer [facility])    breast right  . Depression   . Diabetes ([place])   . GERD (gastroesophageal reflux disease)   . Hyperlipidemia   . Personal history of chemotherapy    Jan 2021 through July 2021    Past Surgical History:  Procedure Laterality Date  . BREAST BIOPSY Right 07/22/2019   mass bx, path pending, heart marker  . BREAST BIOPSY Right 07/22/2019   LN bx, path pending,  butterfly hydromarker  . BREAST RECONSTRUCTION WITH PLACEMENT OF TISSUE EXPANDER AND FLEX HD (ACELLULAR HYDRATED DERMIS) Right 03/21/2020   Procedure: RIGHT BREAST RECONSTRUCTION WITH PLACEMENT OF TISSUE EXPANDER AND FLEX HD (ACELLULAR HYDRATED DERMIS);  Surgeon: [name], MD;  Location: [facility];  Service: Plastics;  Laterality: Right;  . BREAST RECONSTRUCTION WITH PLACEMENT OF TISSUE EXPANDER AND FLEX HD (ACELLULAR HYDRATED DERMIS) Right 12/27/2020   Procedure: BREAST RECONSTRUCTION WITH PLACEMENT OF TISSUE EXPANDER AND FLEX HD (ACELLULAR HYDRATED DERMIS);   Surgeon: [name], MD;  Location: [place];  Service: Plastics;  Laterality: Right;  . IMAGE GUIDED SINUS SURGERY    . INCISION AND DRAINAGE OF WOUND Right 05/02/2020   Procedure: IRRIGATION AND DEBRIDEMENT WOUND WITH REMOVAL OF RIGHT TISSUE EXPANDER;  Surgeon: [name], MD;  Location: [place];  Service: Plastics;  Laterality: Right;  1 hour please  . KNEE SURGERY    . MASTECTOMY Right 03/21/2020  . NASAL SEPTUM SURGERY    . PORTACATH PLACEMENT N/A 08/07/2019   Procedure: INSERTION PORT-A-CATH;  Surgeon: Herbert Pun, MD;  Location: [facility];  Service: General;  Laterality: N/A;  . TONSILLECTOMY    . TOTAL MASTECTOMY Right 03/21/2020   Procedure: TOTAL MASTECTOMY;  Surgeon: Herbert Pun, MD;  Location: [facility];  Service: General;  Laterality: Right;    There were no vitals filed for this visit.   Subjective Assessment - 01/24/21 0936    Subjective Patient reports no pain this am. She reports minimal compliance with her HEP, has been very busy. is set to d/c today d/t last approved visit.    Pertinent History Patient is a 45 year old female with L shoulder pain that began after a L port-a-cath was inserted last December for chemotherapy treatments for R breast cancer. Pt has concluded chemo treatments, but per onocologist is to keep port for a "little while  longer". Has had R masectomy and R expander placement and removal (no current plans for reconstruction). Pt reports a steady increase in pain and loss of motion over time since the port was inserted. Pt has received cortisone shots in L shoulder for bursitis last July and stated it helps decrease pain for some time but did not last very [place]. Pt had an expander put in on R side post masectomy last year but the tissue died over it and is thinking about getting another one. Pt has difficulty and pain with all movements and states only rest and no movement makes her L shoulder feel  better. Pt scales the pain a 10/10 at worst and 1/10 at best when not moving it. Pt is sleeping on her back but depending on if she rolls over, her L shoulder can be painful in the AM. Patient denies any N/V, B&B, unexplained weight loss, night pain, fever, or night sweats.    Limitations Lifting;House hold activities    How [place] can you sit comfortably? unlimited    How [place] can you stand comfortably? unlimited    How [place] can you walk comfortably? unlimited    Patient Stated Goals decrease pain, be able to move LUE to PLOF    Pain Onset More than a month ago               Ther-Ex TRX flex/abd walkouts x12 each   PT reviewed the following HEP with patient with patient able to demonstrate a set of the following with min cuing for correction needed. PT educated patient on parameters of therex (how/when to inc/decrease intensity, frequency, rep/set range, stretch hold time, and purpose of therex) with verbalized understanding.  Prone Scapular Retraction Arms at Side - 2-3 x weekly - 3 sets - 6-10 reps Prone Scapular Retraction Y - 1 x daily - 2-3 x weekly - 3 sets - 6-10 reps Standing Shoulder External Rotation with Resistance - 1 x daily - 2-3 x weekly - 3 sets - 6-10 reps Standing High Row with Resistance - 1 x daily - 2-3 x weekly - 3 sets - 6-10 reps Standing Shoulder Internal Rotation with Anchored Resistance - 1 x daily - 2-3 x weekly - 3 sets - 6-10 reps Standing Shoulder Scaption - 1 x daily - 2-3 x weekly - 3 sets - 6-10 reps                       PT Education - 01/24/21 1007    Education Details therex form/technique    Person(s) Educated Patient    Methods Explanation;Demonstration;Verbal cues    Comprehension Verbalized understanding;Returned demonstration;Verbal cues required            PT Short Term Goals - 01/24/21 1007      PT SHORT TERM GOAL #1   Title Patient will demonstrate independence with HEP to be able to decrease pain and improve L  shoulder function    Baseline 07/25/20 HEP given; 09/27/20 completing 50% of the time    Time 4    Period Weeks    Status On-going             PT Brave Term Goals - 01/24/21 8242      PT [place] TERM GOAL #1   Title Patient will decrease worst pain as reported on NPRS scale by 3 points to demonstrate a clinically significant reduction in pain.    Baseline 07/25/20 10/10 at worst; 09/27/20 4/10; 01/24/21  0/10    Time 8    Period Weeks    Status Achieved      PT [place] TERM GOAL #2   Title Patient will increase L shoulder AROM to be [facility] in order to complete ADLs and household activities without pain or difficulty    Baseline 07/25/20 flexion 109, ER base of skull, IR S1, abd 45; 09/27/20 flexion 139, ER C7, IR L1, abd 110;  01/24/21 flexion 180, ER C7, IR T12, abd 162d    Time 8    Period Weeks    Status Partially Met      PT [place] TERM GOAL #3   Title Patient will increase L shoulder MMT strength to 4/5 to be able to raise arm overhead to complete household chores and return to PLOF    Baseline 07/25/20 flexion, abd, ER, ext 2+ IR 3; 09/27/20 in availible range flex: 4+ abd: 4 ER: 4- IR: 4-; 01/24/21 flex 5/5 abd 5/5 IR 4+/4; ER 4/5    Time 8    Period Weeks    Status Achieved      PT Chevez TERM GOAL #4   Title Patient will increase FOTO score to 64 to demonstrate predicted increase in functional mobility to complete ADLs    Baseline 07/25/20 47 09/27/20 63; 01/24/21 70    Time 8    Period Weeks    Status Achieved                 Plan - 01/24/21 1008    Clinical Impression Statement PT reassessed goals this session where pt has met all goals to safely d/c PT to robust HEP. Pt is able to comply with all cuing (minimal) for proper technique of therex and verbalizes understanding of parameters of HEP. Pt given clinic contact info should any further questions or concerns arise. Pt to d/c PT.    Personal Factors and Comorbidities Comorbidity 1;Past/Current Experience;Time since onset of  injury/illness/exacerbation;Fitness    Comorbidities R breast cancer    Examination-Activity Limitations Bathing;Sleep;Lift;Dressing;Reach Overhead;Hygiene/Grooming    Examination-Participation Restrictions Cleaning;Laundry;Yard Work;Community Activity    Stability/Clinical Decision Making Evolving/Moderate complexity    Rehab Potential Good    PT Frequency 2x / week    PT Duration 8 weeks    PT Treatment/Interventions ADLs/Self Care Home Management;Electrical Stimulation;Moist Heat;Iontophoresis 34m/ml Dexamethasone;Traction;Ultrasound;Functional mobility training;Therapeutic activities;Therapeutic exercise;Neuromuscular re-education;Patient/family education;Manual techniques;Passive range of motion;Dry needling;Scar mobilization;Joint Manipulations;Other (comment)    PT Next Visit Plan DISCHARGE    PT Home Exercise Plan [id number]    Consulted and Agree with Plan of Care Patient           Patient will benefit from skilled therapeutic intervention in order to improve the following deficits and impairments:  Decreased mobility,Hypomobility,Improper body mechanics,Impaired tone,Decreased range of motion,Decreased strength,Increased fascial restricitons,Impaired flexibility,Impaired UE functional use,Postural dysfunction,Pain  Visit Diagnosis: No diagnosis found.     Problem List Patient Active Problem List   Diagnosis Date Noted  . Breast cancer ([place] 03/21/2020  . Anemia due to antineoplastic chemotherapy 02/15/2020  . Cardiomyopathy ([place] 12/12/2019  . Dehydration 11/24/2019  . Acute pain of left shoulder 11/24/2019  . Orthostatic hypotension 10/12/2019  . Low serum cortisol level ([place] 09/29/2019  . Malignant neoplasm of breast in female, estrogen receptor negative ([place] 09/28/2019  . Hypokalemia 09/15/2019  . Hypocalcemia 09/15/2019  . Normocytic anemia 09/15/2019  . B12 deficiency 09/15/2019  . Iron deficiency anemia 09/15/2019  . Encounter for antineoplastic chemotherapy  08/25/2019  . Goals of care, counseling/discussion  08/17/2019  . Weight loss 07/31/2019  . Invasive carcinoma of breast ([place]) 07/30/2019  . H/O diarrhea 01/01/2017   Durwin Reges DPT Durwin Reges 01/24/2021, 10:10 AM  [facility] [address] [place], [address] Phone: [phone]   Fax:  [phone]  Name: Heather Bell MRN: [id number] Date of Birth: Jun 23, 1976

## 2021-01-25 ENCOUNTER — Ambulatory Visit (INDEPENDENT_AMBULATORY_CARE_PROVIDER_SITE_OTHER): Payer: BC Managed Care – PPO | Admitting: Plastic Surgery

## 2021-01-25 ENCOUNTER — Other Ambulatory Visit: Payer: Self-pay

## 2021-01-25 DIAGNOSIS — C50911 Malignant neoplasm of unspecified site of right female breast: Secondary | ICD-10-CM

## 2021-01-25 DIAGNOSIS — C773 Secondary and unspecified malignant neoplasm of axilla and upper limb lymph nodes: Secondary | ICD-10-CM

## 2021-01-25 NOTE — Progress Notes (Signed)
Patient presents postop from delayed right breast reconstruction with tissue expander.  Her skin is healed nicely.  She is here to start the filling process.  I did not fill the expander at all intraoperatively as this was delayed and there was very little skin excess.  75 cc was injected today after prepping the skin with a alcohol pad.  She tolerated this well.  We will plan to see her again in 2 weeks to continue this process.  All her questions were answered.  She now has 75 cc in the expander

## 2021-01-30 ENCOUNTER — Other Ambulatory Visit: Payer: Self-pay

## 2021-01-30 ENCOUNTER — Other Ambulatory Visit: Payer: BC Managed Care – PPO

## 2021-01-30 ENCOUNTER — Ambulatory Visit
Admission: RE | Admit: 2021-01-30 | Discharge: 2021-01-30 | Disposition: A | Discharge status: Home / Self Care | Payer: BC Managed Care – PPO | Source: Ambulatory Visit | Attending: Internal Medicine | Admitting: Internal Medicine

## 2021-01-30 DIAGNOSIS — R931 Abnormal findings on diagnostic imaging of heart and coronary circulation: Secondary | ICD-10-CM | POA: Diagnosis present

## 2021-01-30 DIAGNOSIS — I429 Cardiomyopathy, unspecified: Secondary | ICD-10-CM | POA: Diagnosis present

## 2021-01-30 MED ORDER — TECHNETIUM TC 99M-LABELED RED BLOOD CELLS IV KIT
20.0000 | PACK | Freq: Once | INTRAVENOUS | Status: AC | PRN
  Administered 2021-01-30: 19.9 via INTRAVENOUS

## 2021-02-09 ENCOUNTER — Ambulatory Visit: Payer: BC Managed Care – PPO | Admitting: Surgical

## 2021-02-14 ENCOUNTER — Other Ambulatory Visit: Payer: Self-pay

## 2021-02-14 ENCOUNTER — Encounter: Payer: BC Managed Care – PPO | Attending: Oncology

## 2021-02-14 VITALS — Ht 66.25 in | Wt 155.2 lb

## 2021-02-14 DIAGNOSIS — M25512 Pain in left shoulder: Secondary | ICD-10-CM | POA: Insufficient documentation

## 2021-02-14 DIAGNOSIS — C50511 Malignant neoplasm of lower-outer quadrant of right female breast: Secondary | ICD-10-CM | POA: Insufficient documentation

## 2021-02-14 DIAGNOSIS — M25561 Pain in right knee: Secondary | ICD-10-CM | POA: Insufficient documentation

## 2021-02-14 DIAGNOSIS — Z171 Estrogen receptor negative status [ER-]: Secondary | ICD-10-CM | POA: Insufficient documentation

## 2021-02-14 DIAGNOSIS — M25562 Pain in left knee: Secondary | ICD-10-CM | POA: Insufficient documentation

## 2021-02-14 NOTE — Progress Notes (Signed)
CARE Daily Session Note  Patient Details  Name: Heather Bell MRN: [id number] Date of Birth: October 26, 1975 Referring Provider:   April Manson [facility] & Exercise from 02/14/2021 in [facility]  Referring Provider [name]       Encounter Date: 02/14/2021  Check In:  Session Check In - 02/14/21 1353       Check-In   Supervising physician immediately available to respond to emergencies See telemetry face sheet for immediately available ER MD    Location [facility]    Staff Present Coralie Keens, MS, ASCM CEP, Exercise Physiologist;Jessica Luan Pulling, MA, RCEP, CCRP, Kathaleen Maser, MPA, RN    Virtual Visit No    Medication changes reported     No    Fall or balance concerns reported    No    Warm-up and Cool-down Not performed (comment)   6MWT and Gym Orientation   Resistance Training Performed No    VAD Patient? No    PAD/SET Patient? No               Exercise Prescription Changes - 02/14/21 1300       Response to Exercise   Blood Pressure (Admit) 94/60    Blood Pressure (Exercise) 100/62    Blood Pressure (Exit) 98/62    Heart Rate (Admit) 100 bpm    Heart Rate (Exercise) 113 bpm    Heart Rate (Exit) 98 bpm    Oxygen Saturation (Admit) 96 %    Oxygen Saturation (Exercise) 96 %    Oxygen Saturation (Exit) 98 %    Rating of Perceived Exertion (Exercise) 9    Perceived Dyspnea (Exercise) 0    Symptoms none    Comments walk test results              Initial Exercise Prescription - 02/14/21 1300       Date of Initial Exercise RX and Referring Provider   Date 02/14/21    Referring Provider [name]      Treadmill   MPH 2.1    Grade 0.5    Minutes 15    METs 2.75      Recumbant Bike   Level 2    RPM 60    Watts 30    Minutes 15    METs 3      NuStep   Level 2    SPM 80    Minutes 15    METs 3      REL-XR   Level 1    Speed 50    Minutes 15    METs 3       Prescription Details   Frequency (times per week) 2    Duration Progress to 30 minutes of continuous aerobic without signs/symptoms of physical distress      Intensity   THRR 40-80% of Max Heartrate 129-158    Ratings of Perceived Exertion 11-13    Perceived Dyspnea 0-4      Progression   Progression Continue to progress workloads to maintain intensity without signs/symptoms of physical distress.      Resistance Training   Training Prescription Yes    Weight 4 lb    Reps 10-15              Social History   Tobacco Use  Smoking Status Never  Smokeless Tobacco Never    Goals Met:  Proper associated with RPD/PD & O2 Sat Exercise tolerated well  Personal goals reviewed No report of cardiac concerns or symptoms  Goals Unmet:  Not Applicable  Comments: First full day of orientation. All starting workloads were established based on the results of the 6 minute walk test done at initial orientation visit.  The plan for exercise progression was also introduced and progression will be customized based on patient's performance and goals.    Dr. Cleotilde Filbert is Medical Director for [facility].  Dr. Ottie Glazier is Medical Director for [facility] Pulmonary Rehabilitation.

## 2021-02-16 ENCOUNTER — Other Ambulatory Visit: Payer: Self-pay

## 2021-02-16 ENCOUNTER — Ambulatory Visit (INDEPENDENT_AMBULATORY_CARE_PROVIDER_SITE_OTHER): Payer: BC Managed Care – PPO | Admitting: Internal Medicine

## 2021-02-16 ENCOUNTER — Encounter: Payer: Self-pay | Admitting: Internal Medicine

## 2021-02-16 VITALS — BP 100/78 | HR 97 | Ht 66.0 in | Wt 154.0 lb

## 2021-02-16 DIAGNOSIS — Z853 Personal history of malignant neoplasm of breast: Secondary | ICD-10-CM | POA: Diagnosis not present

## 2021-02-16 DIAGNOSIS — M25562 Pain in left knee: Secondary | ICD-10-CM | POA: Diagnosis not present

## 2021-02-16 DIAGNOSIS — R931 Abnormal findings on diagnostic imaging of heart and coronary circulation: Secondary | ICD-10-CM

## 2021-02-16 DIAGNOSIS — Z171 Estrogen receptor negative status [ER-]: Secondary | ICD-10-CM | POA: Diagnosis not present

## 2021-02-16 DIAGNOSIS — C50511 Malignant neoplasm of lower-outer quadrant of right female breast: Secondary | ICD-10-CM | POA: Diagnosis not present

## 2021-02-16 DIAGNOSIS — Z9189 Other specified personal risk factors, not elsewhere classified: Secondary | ICD-10-CM

## 2021-02-16 DIAGNOSIS — M25512 Pain in left shoulder: Secondary | ICD-10-CM | POA: Diagnosis not present

## 2021-02-16 DIAGNOSIS — M25561 Pain in right knee: Secondary | ICD-10-CM | POA: Diagnosis not present

## 2021-02-16 LAB — GLUCOSE, CAPILLARY
Glucose-Capillary: 125 mg/dL — ABNORMAL HIGH (ref 70–99)
Glucose-Capillary: 90 mg/dL (ref 70–99)

## 2021-02-16 NOTE — Progress Notes (Signed)
Follow-up Outpatient Visit Date: 02/16/2021  Primary Care Provider: [facility] [place] [address]  Chief Complaint: Follow-up possible cardiomyopathy  HPI:  Ms. Heather Bell is a 45 y.o. female with history of hyperlipidemia, diabetes mellitus, breast cancer, and depression, who presents for follow-up of possible cardiomyopathy.  I last saw her in 05/2020, at which time she was feeling well.  Previous echo in 07/2019 made note of low normal LVEF with global hypokinesis.  Due to suboptimal images, we have been following her EF with serial MUGA scans.  Most recent study on 01/30/2021 showed low normal LVEF of 53% (down from 57% a year earlier).  Today, Ms. Cusic reports that she feels fairly well.  She has not had any chest pain, shortness of breath, palpitations, lightheadedness, orthopnea, PND, or edema.  She notes that she has been quite sedentary but is planning to start an exercise program today.  She has completed her breast cancer treatment, which remains in remission.  --------------------------------------------------------------------------------------------------  Cardiovascular History & Procedures: Cardiovascular Problems: Question cardiomyopathy   Risk Factors: Diabetes mellitus, hyperlipidemia, and family history   Cath/PCI: None   CV Surgery: None   EP Procedures and Devices: None   Non-Invasive Evaluation(s): MUGA (01/30/2021): LVEF 53%. MUGA (01/22/2020): LVEF 57%. MUGA (12/08/2019): LVEF 57%. MUGA (08/24/2019): LVEF 60%. TTE (08/13/2019): Normal LV size and wall thickness.  LVEF 50-55% with global hypokinesis.  Normal RV size and function.  Aortic sclerosis.  Trivial mitral regurgitation.  Recent CV Pertinent Labs: Lab Results  Component Value Date   K 3.5 12/19/2020   K 3.4 (L) 03/29/2014   MG 1.8 03/08/2020   MG 6.0 (H) 06/09/2012   BUN 14 12/19/2020   BUN 6 (L) 03/29/2014   CREATININE 0.73 12/19/2020    CREATININE 0.85 03/29/2014    Past medical and surgical history were reviewed and updated in EPIC.  Current Meds  Medication Sig   amphetamine-dextroamphetamine (ADDERALL XR) 25 MG 24 hr capsule Take 25 mg by mouth daily.   buPROPion (WELLBUTRIN SR) 100 MG 12 hr tablet Take 100 mg by mouth every morning.   Calcium Carb-Cholecalciferol (CALCIUM 600+D) 600-800 MG-UNIT TABS Take 1 tablet by mouth daily.   cetirizine (ZYRTEC) 10 MG tablet Take 10 mg by mouth daily.    diazepam (VALIUM) 10 MG tablet Take 10 mg by mouth. As needed prior to procedures   doxepin (SINEQUAN) 25 MG capsule Take 25-50 mg by mouth at bedtime.    ibuprofen (ADVIL) 200 MG tablet Take 400-800 mg by mouth every 6 (six) hours as needed for headache or moderate pain.   lamoTRIgine (LAMICTAL) 150 MG tablet Take 150 mg by mouth daily.   lurasidone (LATUDA) 40 MG TABS tablet Take 40 mg by mouth at bedtime.    Lurasidone HCl 60 MG TABS Take 60 mg by mouth at bedtime.    metFORMIN (GLUCOPHAGE-XR) 500 MG 24 hr tablet Take 500 mg by mouth 2 (two) times daily.   mometasone (NASONEX) 50 MCG/ACT nasal spray Place 2 sprays into the nose daily as needed (allergies).    Multiple Vitamin (MULTIVITAMIN WITH MINERALS) TABS tablet Take 1 tablet by mouth daily.   omeprazole (PRILOSEC) 20 MG capsule Take 20 mg by mouth daily before breakfast.    QUEtiapine (SEROQUEL) 300 MG tablet Take 300 mg by mouth at bedtime.   simvastatin (ZOCOR) 20 MG tablet Take 20 mg by mouth daily.   topiramate (TOPAMAX) 100 MG tablet Take 50-100 mg by  mouth See admin instructions. Take 1 tablet (100 mg) by mouth in the morning & take 0.5 tablet (50 mg) by mouth at night.   vitamin B-12 (CYANOCOBALAMIN) 1000 MCG tablet Take 1,000 mcg by mouth daily.    Allergies: Compazine [prochlorperazine edisylate], Morphine and related, Famotidine, Doxycycline, Fluticasone, and Levofloxacin  Social History   Tobacco Use   Smoking status: Never   Smokeless tobacco: Never   Vaping Use   Vaping Use: Never used  Substance Use Topics   Alcohol use: No    Alcohol/week: 0.0 standard drinks   Drug use: No    Family History  Problem Relation Age of Onset   Lung cancer Father    Heart attack Father    Lung cancer Paternal Aunt    Lung cancer Paternal Uncle    Heart disease Paternal Uncle    Breast cancer Neg Hx     Review of Systems: A 12-system review of systems was performed and was negative except as noted in the HPI.  --------------------------------------------------------------------------------------------------  Physical Exam: BP 100/78 (BP Location: Left Arm, Patient Position: Sitting, Cuff Size: Normal)   Pulse 97   Ht 5\' 6"  (1.676 m)   Wt 154 lb (69.9 kg)   SpO2 98%   BMI 24.86 kg/m   General:  NAD. Neck: No JVD or HJR. Lungs: Clear to auscultation bilaterally without wheezes or crackles. Heart: Regular rate and rhythm without murmurs, rubs, or gallops. Abdomen: Soft, nontender, nondistended. Extremities: No lower extremity edema.  EKG: Normal sinus rhythm without abnormality.  Lab Results  Component Value Date   WBC 5.6 12/19/2020   HGB 11.1 (L) 12/19/2020   HCT 34.6 (L) 12/19/2020   MCV 88.3 12/19/2020   PLT 321 12/19/2020    Lab Results  Component Value Date   NA 139 12/19/2020   K 3.5 12/19/2020   CL 105 12/19/2020   CO2 25 12/19/2020   BUN 14 12/19/2020   CREATININE 0.73 12/19/2020   GLUCOSE 90 12/19/2020   ALT 11 12/19/2020    No results found for: CHOL, HDL, LDLCALC, LDLDIRECT, TRIG, CHOLHDL  --------------------------------------------------------------------------------------------------  ASSESSMENT AND PLAN: Breast cancer, abnormal echocardiogram, and risk for cardiomyopathy Ms. Boys is completed treatment of her breast cancer with initial concern for possible mildly reduced LVEF by echo.  MUGA scans have shown persistently normal LVEF, though it has been trending down slightly (initially 60%, most  recently 53% earlier this month).  Ms. Nazir does not have any signs or symptoms of heart failure.  I recommend that we repeat a MUGA scan in 6 months to ensure that her LVEF does not continue to drop.  If it does, we would need to consider addition of evidence-based heart failure therapy and also pursue ischemia testing prior to referring her to a cardio oncology specialist for Hopf-term follow-up.  Follow-up: Return to clinic in 6 months following MUGA scan.  Nelva Bush, MD 02/16/2021 11:24 AM

## 2021-02-16 NOTE — Patient Instructions (Addendum)
Medication Instructions:  Your physician recommends that you continue on your current medications as directed. Please refer to the Current Medication list given to you today.  *If you need a refill on your cardiac medications before your next appointment, please call your pharmacy*   Lab Work: None ordered  If you have labs (blood work) drawn today and your tests are completely normal, you will receive your results only by: [place] (if you have MyChart) OR A paper copy in the mail If you have any lab test that is abnormal or we need to change your treatment, we will call you to review the results.   Testing/Procedures:  Your physician has requested that you have a MUGA in 6 months: A multigated acquisition (MUGA) scan is a test that looks at the chambers and blood vessels of the heart.   Our office will be in contact with you to schedule this procedure.    Follow-Up: At [facility], you and your health needs are our priority.  As part of our continuing mission to provide you with exceptional heart care, we have created designated Provider Care Teams.  These Care Teams include your primary Cardiologist (physician) and Advanced Practice Providers (APPs -  Physician Assistants and Nurse Practitioners) who all work together to provide you with the care you need, when you need it.  We recommend signing up for the patient portal called "MyChart".  Sign up information is provided on this After Visit Summary.  MyChart is used to connect with patients for Virtual Visits (Telemedicine).  Patients are able to view lab/test results, encounter notes, upcoming appointments, etc.  Non-urgent messages can be sent to your provider as well.   To learn more about what you can do with MyChart, go to [email or web address].    Your next appointment:   6 month(s) after MUGA scan  The format for your next appointment:   In Person  Provider:   You may see Dr. Harrell Gave End or one of the  following Advanced Practice Providers on your designated Care Team:   Murray Hodgkins, NP Christell Faith, PA-C Marrianne Mood, PA-C Cadence Reynolds, [place] Laurann [place], NP

## 2021-02-16 NOTE — Progress Notes (Signed)
Daily Session Note  Patient Details  Name: Heather Bell MRN: [id number] Date of Birth: [birth year] Referring Provider:   April Manson Cancer Associated Rehabilitation & Exercise from 02/14/2021 in [facility] Cardiac and Pulmonary Rehab  Referring Provider [name]       Encounter Date: 02/16/2021  Check In:  Session Check In - 02/16/21 1338       Check-In   Supervising physician immediately available to respond to emergencies See telemetry face sheet for immediately available ER MD    Location [facility]-Cardiac & Pulmonary Rehab    Staff Present Nada Maclachlan, BA, ACSM CEP, Exercise Physiologist;Maxima Skelton Eliezer Bottom, MS, ASCM CEP, Exercise Physiologist;Kelly Rosalia Hammers, MPA, RN    Virtual Visit No    Medication changes reported     No    Fall or balance concerns reported    No    Warm-up and Cool-down Performed on first and last piece of equipment    Resistance Training Performed Yes    VAD Patient? No    PAD/SET Patient? No                Social History   Tobacco Use  Smoking Status Never  Smokeless Tobacco Never    Goals Met:  Proper associated with RPD/PD & O2 Sat Independence with exercise equipment Exercise tolerated well No report of cardiac concerns or symptoms Strength training completed today  Goals Unmet:  Not Applicable  Comments: First full day of exercise!  Patient was oriented to gym and equipment including functions, settings, policies, and procedures.  Patient's individual exercise prescription and treatment plan were reviewed.  All starting workloads were established based on the results of the 6 minute walk test done at initial orientation visit.  The plan for exercise progression was also introduced and progression will be customized based on patient's performance and goals.    Dr. Shakoya Filbert is Medical Director for [place].  Dr. Ottie Glazier is Medical Director for [facility] Pulmonary Rehabilitation.

## 2021-02-17 DIAGNOSIS — Z853 Personal history of malignant neoplasm of breast: Secondary | ICD-10-CM | POA: Insufficient documentation

## 2021-02-17 DIAGNOSIS — R931 Abnormal findings on diagnostic imaging of heart and coronary circulation: Secondary | ICD-10-CM | POA: Insufficient documentation

## 2021-02-23 ENCOUNTER — Other Ambulatory Visit: Payer: Self-pay

## 2021-02-23 ENCOUNTER — Encounter: Payer: BC Managed Care – PPO | Attending: Oncology

## 2021-02-23 DIAGNOSIS — C50511 Malignant neoplasm of lower-outer quadrant of right female breast: Secondary | ICD-10-CM | POA: Diagnosis present

## 2021-02-23 DIAGNOSIS — Z171 Estrogen receptor negative status [ER-]: Secondary | ICD-10-CM | POA: Diagnosis present

## 2021-02-23 LAB — GLUCOSE, CAPILLARY
Glucose-Capillary: 139 mg/dL — ABNORMAL HIGH (ref 70–99)
Glucose-Capillary: 103 mg/dL — ABNORMAL HIGH (ref 70–99)

## 2021-02-23 NOTE — Progress Notes (Signed)
Daily Session Note  Patient Details  Name: Heather Bell MRN: [id number] Date of Birth: 05-29-1976 Referring Provider:   April Manson Cancer Associated Rehabilitation & Exercise from 02/14/2021 in [facility] Cardiac and Pulmonary Rehab  Referring Provider [name]       Encounter Date: 02/23/2021  Check In:  Session Check In - 02/23/21 1229       Check-In   Supervising physician immediately available to respond to emergencies See telemetry face sheet for immediately available ER MD    Location [facility] Pul[facility] Re[facility]    Staff Present Birdie Sons, MPA, RN;Jessica Elbert, MA, RCEP, CCRP, Marylynn Pearson, MS, ASCM CEP, Exercise Physiologist    Virtual Visit No    Medication changes reported     No    Fall or balance concerns reported    No    Warm-up and Cool-down Performed on first and last piece of equipment    Resistance Training Performed Yes    VAD Patient? No    PAD/SET Patient? No      Pain Assessment   Currently in Pain? No/denies                Social History   Tobacco Use  Smoking Status Never  Smokeless Tobacco Never    Goals Met:  Independence with exercise equipment Exercise tolerated well No report of cardiac concerns or symptoms Strength training completed today  Goals Unmet:  Not Applicable  Comments: Pt able to follow exercise prescription today without complaint.  Will continue to monitor for progression.    Dr. Ardith Filbert is Medical Director for [place].  Dr. Ottie Glazier is Medical Director for [facility] Pulmonary Rehabilitation.

## 2021-02-24 ENCOUNTER — Encounter: Payer: Self-pay | Admitting: Surgical

## 2021-02-24 ENCOUNTER — Ambulatory Visit (INDEPENDENT_AMBULATORY_CARE_PROVIDER_SITE_OTHER): Payer: BC Managed Care – PPO | Admitting: Surgical

## 2021-02-24 ENCOUNTER — Other Ambulatory Visit: Payer: Self-pay

## 2021-02-24 DIAGNOSIS — C773 Secondary and unspecified malignant neoplasm of axilla and upper limb lymph nodes: Secondary | ICD-10-CM

## 2021-02-24 DIAGNOSIS — C50911 Malignant neoplasm of unspecified site of right female breast: Secondary | ICD-10-CM

## 2021-02-24 DIAGNOSIS — S21001D Unspecified open wound of right breast, subsequent encounter: Secondary | ICD-10-CM

## 2021-02-24 NOTE — Progress Notes (Signed)
Patient is a 45 year old female here for follow-up on her right breast reconstruction placement of tissue expander with Dr. Claudia Desanctis on 12/27/2020.  She is 2 months postop.  She had some delayed expansion due to very little excess skin.  She reports that overall she is doing well, tolerated her last fill fine with no issues.  She is interested in an additional fill today.  No infectious symptoms  Chaperone present on exam On exam right breast incision is intact, expander in place, no subcutaneous fluid collection noted with palpation.  No erythema noted.  No wounds noted.  We placed injectable saline in the Expander using a sterile technique: Right: 50 cc for a total of 125 / 300 cc  Follow-up in 2 weeks for additional fill.  No sign of seroma, hematoma.

## 2021-03-02 ENCOUNTER — Encounter: Payer: BC Managed Care – PPO | Admitting: *Deleted

## 2021-03-02 ENCOUNTER — Other Ambulatory Visit: Payer: Self-pay

## 2021-03-02 DIAGNOSIS — C50511 Malignant neoplasm of lower-outer quadrant of right female breast: Secondary | ICD-10-CM

## 2021-03-02 NOTE — Progress Notes (Signed)
Daily Session Note  Patient Details  Name: Heather Bell MRN: [id number] Date of Birth: 05/03/1976 Referring Provider:   Flowsheet Row Cancer Associated Rehabilitation & Exercise from 02/14/2021 in [facility] Cardiac and Pulmonary Rehab  Referring Provider Burns, Jennifer NP       Encounter Date: 03/02/2021  Check In:  Session Check In - 03/02/21 1247       Check-In   Supervising physician immediately available to respond to emergencies See telemetry face sheet for immediately available ER MD    Location [facility]Card[facility] & Pulmonary Rehab    Staff Present  , MA, RCEP, CCRP, CCET;Kara Langdon, MS, ASCM CEP, Exercise Physiologist    Virtual Visit No    Medication changes reported     No    Fall or balance concerns reported    No    Warm-up and Cool-down Performed on first and last piece of equipment    Resistance Training Performed Yes    VAD Patient? No    PAD/SET Patient? No      Pain Assessment   Currently in Pain? No/denies                Social History   Tobacco Use  Smoking Status Never  Smokeless Tobacco Never    Goals Met:  Proper associated with RPD/PD & O2 Sat Independence with exercise equipment Exercise tolerated well No report of cardiac concerns or symptoms Strength training completed today  Goals Unmet:  Not Applicable  Comments: Pt able to follow exercise prescription today without complaint.  Will continue to monitor for progression.    Dr. Mark Miller is Medical Director for [facility].  Dr. Fuad Aleskerov is Medical Director for [facility] Pulmonary Rehabilitation. 

## 2021-03-09 ENCOUNTER — Other Ambulatory Visit: Payer: Self-pay

## 2021-03-09 DIAGNOSIS — C50511 Malignant neoplasm of lower-outer quadrant of right female breast: Secondary | ICD-10-CM

## 2021-03-09 NOTE — Progress Notes (Signed)
Daily Session Note  Patient Details  Name: Heather Bell MRN: [id number] Date of Birth: Jul 26, 1976 Referring Provider:   April Manson Cancer Associated Rehabilitation & Exercise from 02/14/2021 in [facility] Cardiac and Pulmonary Rehab  Referring Provider [name]       Encounter Date: 03/09/2021  Check In:  Session Check In - 03/09/21 1209       Check-In   Supervising physician immediately available to respond to emergencies See telemetry face sheet for immediately available ER MD    Location [facility] & Pulmonary Re[facility]    Staff Present Birdie Sons, MPA, Nino Glow, MS, ASCM CEP, Exercise Physiologist;Amanda Oletta Darter, BA, ACSM CEP, Exercise Physiologist    Virtual Visit No    Medication changes reported     No    Fall or balance concerns reported    No    Warm-up and Cool-down Performed on first and last piece of equipment    Resistance Training Performed Yes    VAD Patient? No    PAD/SET Patient? No      Pain Assessment   Currently in Pain? No/denies                Social History   Tobacco Use  Smoking Status Never  Smokeless Tobacco Never    Goals Met:  Independence with exercise equipment Exercise tolerated well No report of cardiac concerns or symptoms Strength training completed today  Goals Unmet:  Not Applicable  Comments: Pt able to follow exercise prescription today without complaint.  Will continue to monitor for progression.    Dr. Ilham Filbert is Medical Director for [place].  Dr. Ottie Glazier is Medical Director for [facility] Pulmonary Rehabilitation.

## 2021-03-11 IMAGING — MR MR SHOULDER*L* W/O CM
4 of 5 series · 33 of 40 positions shown · non-contrast
Comparison: None.

CLINICAL DATA: Left shoulder pain.  Limited range of motion.

EXAM:
MRI OF THE LEFT SHOULDER WITHOUT CONTRAST
TECHNIQUE: Multiplanar, multisequence MR imaging of the shoulder was performed.
No intravenous contrast was administered.

[Series 5: T2 fat-sat · axial · left · 4.0mm · 0.55mm/px · z∈[-53,+67]mm · 8 of 26 slices shown (1 of 3)]
[im 1/26]
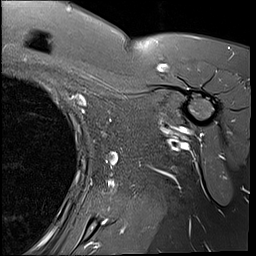
[im 4/26]
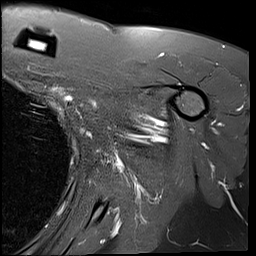
[im 8/26]
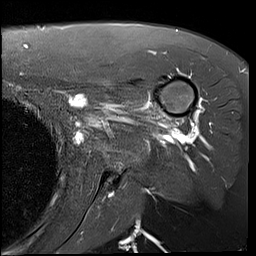
[im 11/26]
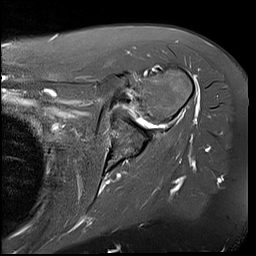
[im 15/26]
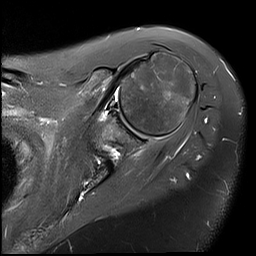
[im 18/26]
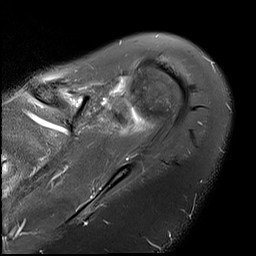
[im 22/26]
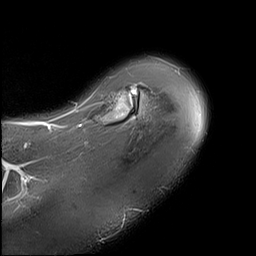
[im 26/26]
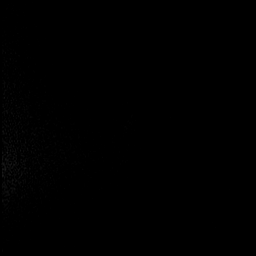

[Series 6: PD · oblique · left · 4.0mm · 0.44mm/px · 9 of 26 slices shown]
[im 1/26]
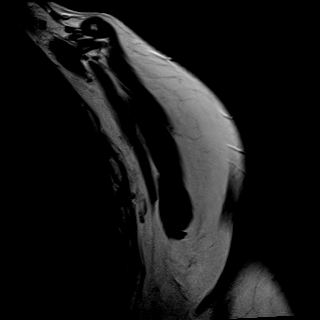
[im 4/26]
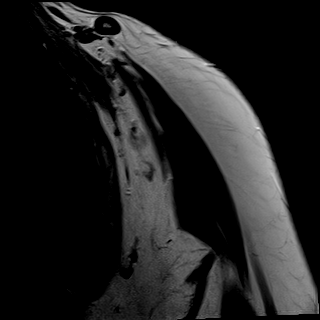
[im 7/26]
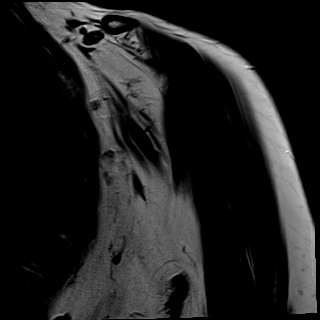
[im 10/26]
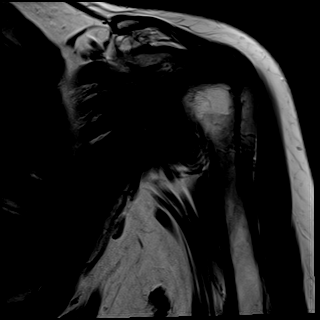
[im 13/26]
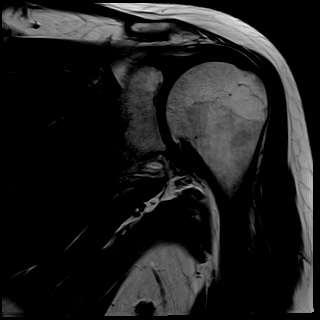
[im 16/26]
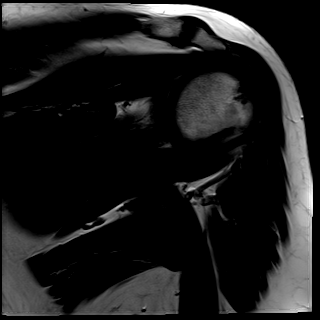
[im 19/26]
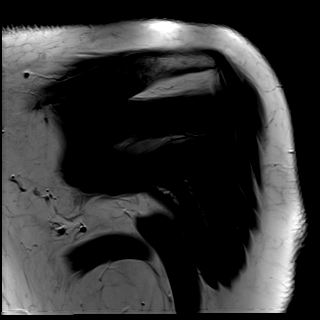
[im 22/26]
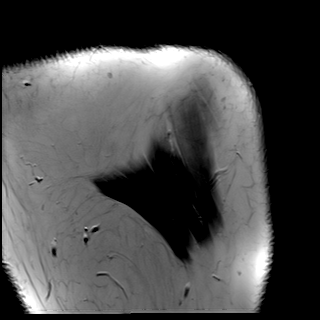
[im 26/26]
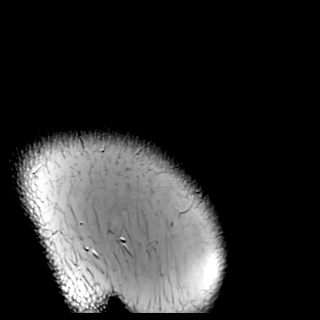

[Series 7: T2 fat-sat · oblique · left · 4.0mm · 0.44mm/px · 9 of 26 slices shown (2 of 3)]
[im 1/26]
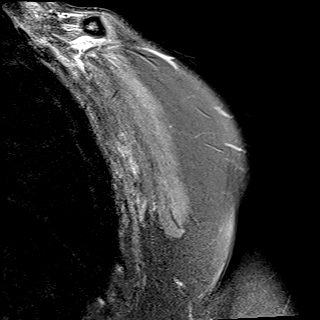
[im 4/26]
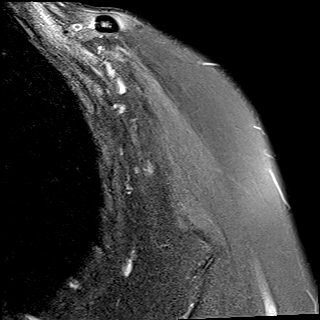
[im 7/26]
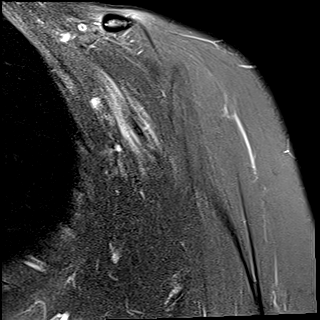
[im 10/26]
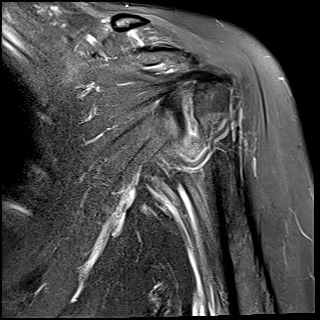
[im 13/26]
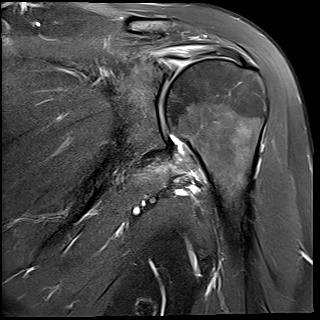
[im 16/26]
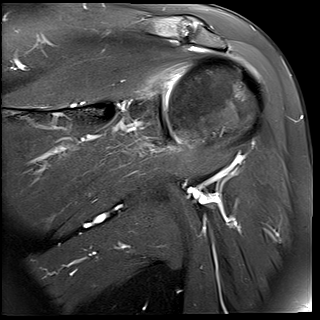
[im 19/26]
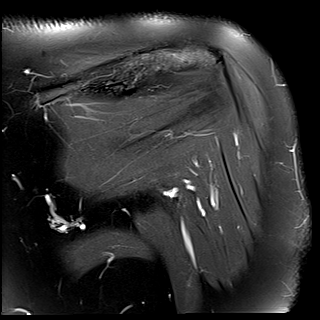
[im 22/26]
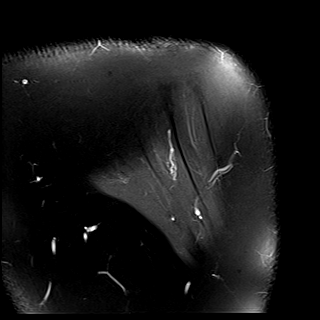
[im 26/26]
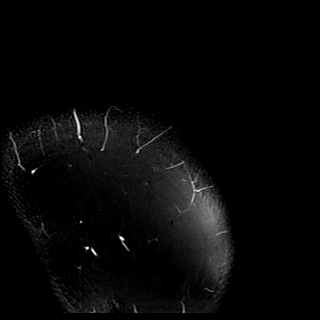

[Series 8: T2 fat-sat · oblique · left · 4.0mm · 0.23mm/px · 7 of 22 slices shown (3 of 3)]
[im 1/22]
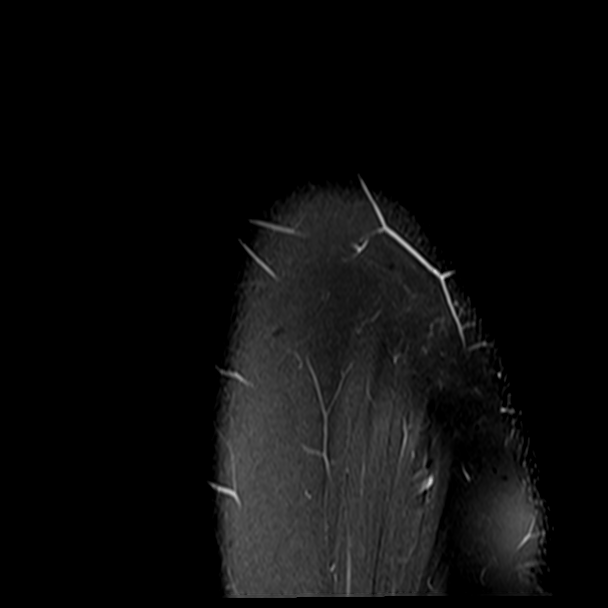
[im 4/22]
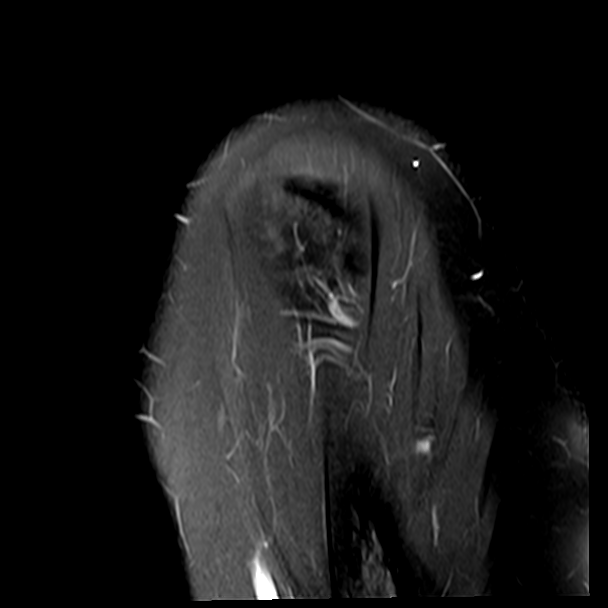
[im 8/22]
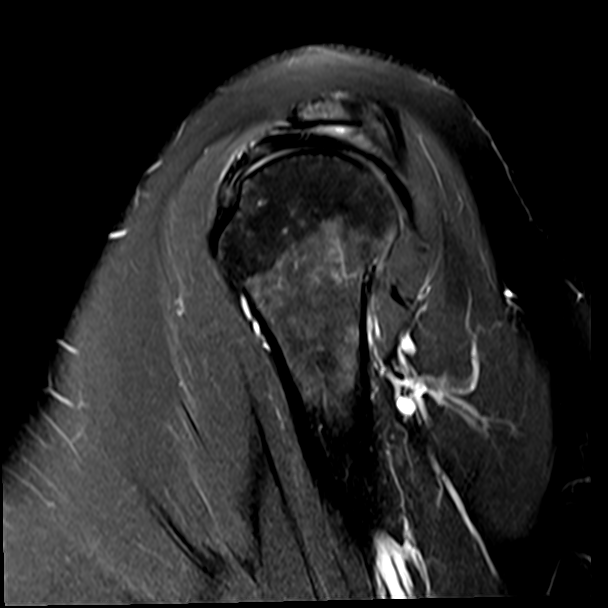
[im 11/22]
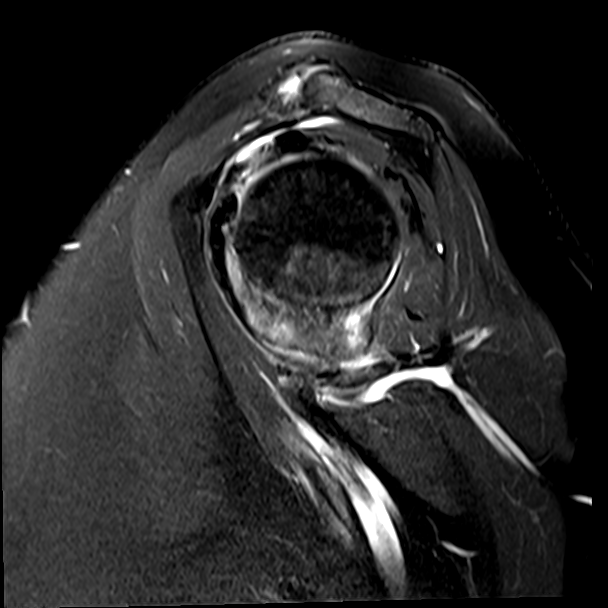
[im 15/22]
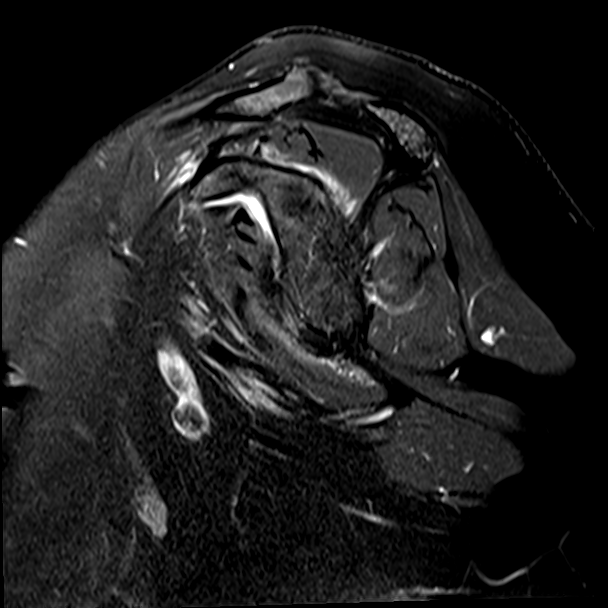
[im 18/22]
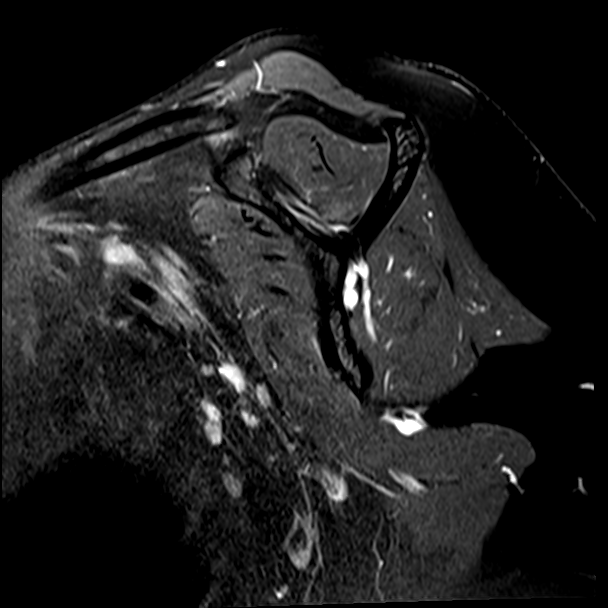
[im 22/22]
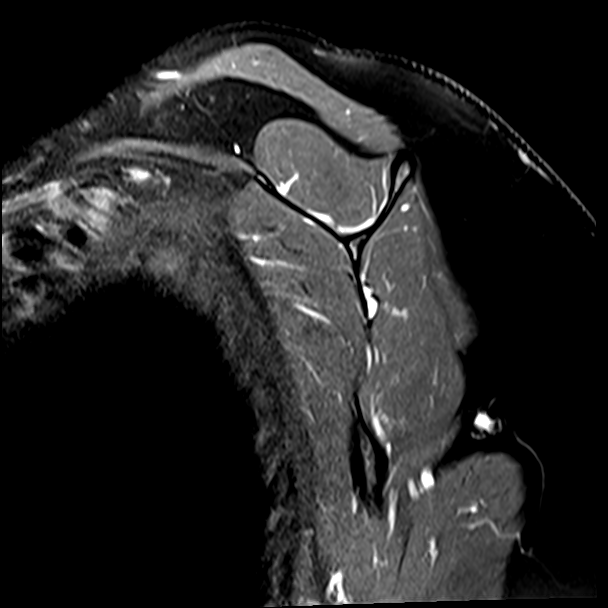

[33 of 40 positions shown; findings below may reference images not displayed]

FINDINGS: Rotator cuff: Mild tendinosis of the supraspinatus tendon.
Infraspinatus tendon is intact. Teres minor tendon is intact.
Subscapularis tendon is intact.

Muscles: No muscle atrophy or edema. No intramuscular fluid
collection or hematoma.

Biceps Long Head: Complete tear of the intra-articular portion of
the long head of the biceps tendon.

Acromioclavicular Joint: Mild arthropathy of the acromioclavicular
joint. Type II acromion. Trace subacromial/subdeltoid bursal fluid.

Glenohumeral Joint: No joint effusion. No chondral defect.
Thickening of the inferior joint capsule as can be seen with
adhesive capsulitis.

Labrum: Superior labral tear from anterior to posterior.

Bones: No fracture or dislocation. No aggressive osseous lesion.

Other: No fluid collection or hematoma.
IMPRESSION: 1. Mild tendinosis of the supraspinatus tendon.
2. Complete tear of the intra-articular portion of the long head of
the biceps tendon.
3. Superior labral tear from anterior to posterior.
4. Thickening of the inferior joint capsule as can be seen with
adhesive capsulitis.

## 2021-03-14 ENCOUNTER — Ambulatory Visit: Payer: BC Managed Care – PPO | Admitting: Plastic Surgery

## 2021-03-14 ENCOUNTER — Other Ambulatory Visit: Payer: Self-pay

## 2021-03-14 DIAGNOSIS — C50511 Malignant neoplasm of lower-outer quadrant of right female breast: Secondary | ICD-10-CM

## 2021-03-14 DIAGNOSIS — Z171 Estrogen receptor negative status [ER-]: Secondary | ICD-10-CM

## 2021-03-14 NOTE — Progress Notes (Signed)
Daily Session Note  Patient Details  Name: Heather Bell MRN: [id number] Date of Birth: 08-24-1975 Referring Provider:   April Manson Cancer Associated Rehabilitation & Exercise from 02/14/2021 in [facility] Cardiac and Pulmonary Rehab  Referring Provider [name]       Encounter Date: 03/14/2021  Check In:  Session Check In - 03/14/21 1225       Check-In   Supervising physician immediately available to respond to emergencies See telemetry face sheet for immediately available ER MD    Location [facility]-Cardiac & Pulmonary Rehab    Staff Present Birdie Sons, MPA, RN;Jessica [place], MA, RCEP, CCRP, Marylynn Pearson, MS, ASCM CEP, Exercise Physiologist    Virtual Visit No    Medication changes reported     No    Fall or balance concerns reported    No    Warm-up and Cool-down Performed on first and last piece of equipment    Resistance Training Performed Yes    VAD Patient? No    PAD/SET Patient? No      Pain Assessment   Currently in Pain? No/denies                Social History   Tobacco Use  Smoking Status Never  Smokeless Tobacco Never    Goals Met:  Independence with exercise equipment Exercise tolerated well No report of cardiac concerns or symptoms Strength training completed today  Goals Unmet:  Not Applicable  Comments: Pt able to follow exercise prescription today without complaint.  Will continue to monitor for progression.    Dr. Kristia Filbert is Medical Director for [place].  Dr. Ottie Glazier is Medical Director for [facility] Pulmonary Rehabilitation.

## 2021-03-15 ENCOUNTER — Ambulatory Visit: Payer: BC Managed Care – PPO | Admitting: Plastic Surgery

## 2021-03-16 ENCOUNTER — Other Ambulatory Visit: Payer: Self-pay

## 2021-03-16 DIAGNOSIS — Z171 Estrogen receptor negative status [ER-]: Secondary | ICD-10-CM

## 2021-03-16 DIAGNOSIS — C50511 Malignant neoplasm of lower-outer quadrant of right female breast: Secondary | ICD-10-CM

## 2021-03-16 NOTE — Progress Notes (Signed)
Daily Session Note  Patient Details  Name: Heather Bell MRN: [id number] Date of Birth: 04-07-1976 Referring Provider:   April Manson Cancer Associated Rehabilitation & Exercise from 02/14/2021 in [facility] Cardiac and Pulmonary Rehab  Referring Provider [name]       Encounter Date: 03/16/2021  Check In:  Session Check In - 03/16/21 1249       Check-In   Supervising physician immediately available to respond to emergencies See telemetry face sheet for immediately available ER MD    Location [facility]-Card[facility] & Pulmonary Rehab    Staff Present Nada Maclachlan, BA, ACSM CEP, Exercise Physiologist;Amillya Chavira Eliezer Bottom, MS, ASCM CEP, Exercise Physiologist    Virtual Visit No    Medication changes reported     No    Fall or balance concerns reported    No    Warm-up and Cool-down Performed on first and last piece of equipment    Resistance Training Performed No   yoga   VAD Patient? No    PAD/SET Patient? No                Social History   Tobacco Use  Smoking Status Never  Smokeless Tobacco Never    Goals Met:  Exercise tolerated well Queuing for purse lip breathing No report of cardiac concerns or symptoms  Goals Unmet:  Not Applicable  Comments: Pt able to follow exercise prescription today without complaint.  Will continue to monitor for progression.   Yoga completed today by Nada Maclachlan, CEP.   Dr. Shelley Filbert is Medical Director for [place].  Dr. Ottie Glazier is Medical Director for [facility] Pulmonary Rehabilitation.

## 2021-03-21 ENCOUNTER — Inpatient Hospital Stay (HOSPITAL_BASED_OUTPATIENT_CLINIC_OR_DEPARTMENT_OTHER): Payer: BC Managed Care – PPO | Admitting: Internal Medicine

## 2021-03-21 ENCOUNTER — Inpatient Hospital Stay: Payer: BC Managed Care – PPO | Attending: Oncology

## 2021-03-21 ENCOUNTER — Other Ambulatory Visit: Payer: Self-pay

## 2021-03-21 ENCOUNTER — Encounter: Payer: Self-pay | Admitting: Internal Medicine

## 2021-03-21 ENCOUNTER — Encounter: Payer: BC Managed Care – PPO | Attending: Oncology

## 2021-03-21 DIAGNOSIS — R5383 Other fatigue: Secondary | ICD-10-CM | POA: Insufficient documentation

## 2021-03-21 DIAGNOSIS — E119 Type 2 diabetes mellitus without complications: Secondary | ICD-10-CM | POA: Diagnosis not present

## 2021-03-21 DIAGNOSIS — E538 Deficiency of other specified B group vitamins: Secondary | ICD-10-CM | POA: Insufficient documentation

## 2021-03-21 DIAGNOSIS — Z801 Family history of malignant neoplasm of trachea, bronchus and lung: Secondary | ICD-10-CM | POA: Diagnosis not present

## 2021-03-21 DIAGNOSIS — C50511 Malignant neoplasm of lower-outer quadrant of right female breast: Secondary | ICD-10-CM | POA: Diagnosis present

## 2021-03-21 DIAGNOSIS — Z171 Estrogen receptor negative status [ER-]: Secondary | ICD-10-CM | POA: Diagnosis not present

## 2021-03-21 DIAGNOSIS — D649 Anemia, unspecified: Secondary | ICD-10-CM | POA: Diagnosis not present

## 2021-03-21 LAB — COMPREHENSIVE METABOLIC PANEL
ALT: 13 U/L (ref 0–44)
AST: 16 U/L (ref 15–41)
Albumin: 4.1 g/dL (ref 3.5–5.0)
Alkaline Phosphatase: 76 U/L (ref 38–126)
Anion gap: 6 (ref 5–15)
BUN: 13 mg/dL (ref 6–20)
CO2: 25 mmol/L (ref 22–32)
Calcium: 9.3 mg/dL (ref 8.9–10.3)
Chloride: 109 mmol/L (ref 98–111)
Creatinine, Ser: 0.87 mg/dL (ref 0.44–1.00)
GFR, Estimated: >60 mL/min (ref >60)
Glucose, Bld: 127 mg/dL — ABNORMAL HIGH (ref 70–99)
Potassium: 3.3 mmol/L — ABNORMAL LOW (ref 3.5–5.1)
Sodium: 140 mmol/L (ref 135–145)
Total Bilirubin: 0.4 mg/dL (ref 0.3–1.2)
Total Protein: 7.4 g/dL (ref 6.5–8.1)

## 2021-03-21 LAB — CBC WITH DIFFERENTIAL/PLATELET
Abs Immature Granulocytes: 0.03 10*3/uL (ref 0.00–0.07)
Basophils Absolute: 0 10*3/uL (ref 0.0–0.1)
Basophils Relative: 0 %
Eosinophils Absolute: 0.2 10*3/uL (ref 0.0–0.5)
Eosinophils Relative: 2 %
HCT: 35.6 % — ABNORMAL LOW (ref 36.0–46.0)
Hemoglobin: 11.5 g/dL — ABNORMAL LOW (ref 12.0–15.0)
Immature Granulocytes: 0 %
Lymphocytes Relative: 22 %
Lymphs Abs: 1.9 10*3/uL (ref 0.7–4.0)
MCH: 28.7 pg (ref 26.0–34.0)
MCHC: 32.3 g/dL (ref 30.0–36.0)
MCV: 88.8 fL (ref 80.0–100.0)
Monocytes Absolute: 0.5 10*3/uL (ref 0.1–1.0)
Monocytes Relative: 6 %
Neutro Abs: 5.9 10*3/uL (ref 1.7–7.7)
Neutrophils Relative %: 70 %
Platelets: 321 10*3/uL (ref 150–400)
RBC: 4.01 MIL/uL (ref 3.87–5.11)
RDW: 13.2 % (ref 11.5–15.5)
WBC: 8.5 10*3/uL (ref 4.0–10.5)
nRBC: 0 % (ref 0.0–0.2)

## 2021-03-21 LAB — VITAMIN B12: Vitamin B-12: 1873 pg/mL — ABNORMAL HIGH (ref 180–914)

## 2021-03-21 LAB — IRON AND TIBC
Iron: 53 ug/dL (ref 28–170)
Saturation Ratios: 17 % (ref 10.4–31.8)
TIBC: 305 ug/dL (ref 250–450)
UIBC: 252 ug/dL

## 2021-03-21 LAB — FOLATE: Folate: >100 ng/mL (ref >5.9)

## 2021-03-21 LAB — FERRITIN: Ferritin: 67 ng/mL (ref 11–307)

## 2021-03-21 NOTE — Progress Notes (Signed)
Daily Session Note  Patient Details  Name: Heather Bell MRN: [id number] Date of Birth: [birth year] Referring Provider:   April Manson Cancer Associated Rehabilitation & Exercise from 02/14/2021 in [facility] Cardiac and Pulmonary Rehab  Referring Provider [name]       Encounter Date: 03/21/2021  Check In:  Session Check In - 03/21/21 1241       Check-In   Supervising physician immediately available to respond to emergencies See telemetry face sheet for immediately available ER MD    Location [facility]-Cardiac & Pulmonary Rehab    Staff Present Birdie Sons, MPA, Nino Glow, MS, ASCM CEP, Exercise Physiologist;Melissa Caiola, RDN, LDN    Virtual Visit No    Medication changes reported     No    Fall or balance concerns reported    No    Warm-up and Cool-down Performed on first and last piece of equipment    Resistance Training Performed Yes    VAD Patient? No    PAD/SET Patient? No      Pain Assessment   Currently in Pain? No/denies                Social History   Tobacco Use  Smoking Status Never  Smokeless Tobacco Never    Goals Met:  Independence with exercise equipment Exercise tolerated well No report of cardiac concerns or symptoms Strength training completed today  Goals Unmet:  Not Applicable  Comments: Pt able to follow exercise prescription today without complaint.  Will continue to monitor for progression.    Dr. Casady Filbert is Medical Director for [place].  Dr. Ottie Glazier is Medical Director for [facility] Pulmonary Rehabilitation.

## 2021-03-21 NOTE — Assessment & Plan Note (Addendum)
#   Clinical stageIIB triple negativeright breast cancer: s/p neoadjuvant chemotherapy followed by right total mastectomy and sentinel lymph node biopsy-pathology complete pathologic response.  Clinically no evidence of recurrence.  December 2021 mammogram left side normal.  Reconstruction-right breast May 2022.  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  Clinically stable.  #  Normocytic anemia: S/p IV iron infusion- [feb [id number] ?  August 2022-hemoglobin 9.5 saturation 17% etiology-  #B12 deficiency: B12 deficiency on p.o. B12  #Fatigue: Recommend care program  # Genetics-discuss  # DISPOSITION: # follow up in 3 months- MD; labs- cbc/[id number]

## 2021-03-21 NOTE — Progress Notes (Signed)
[place] NOTE  Patient Care Team: Center, [facility] as PCP - General ([place]) Theodore Demark, RN as Registered Nurse (Oncology) Lequita Asal, MD (Inactive) as Referring Physician (Hematology and Oncology) Herbert Pun, MD as Consulting Physician (General Surgery) End, Harrell Gave, MD as Consulting Physician (Cardiology)  CHIEF COMPLAINTS/PURPOSE OF CONSULTATION: Triple negative breast cancer  #  Oncology History Overview Note  #  Clinical stage IIB triple negative right breast cancer: s/p neoadjuvant chemotherapy followed by right total mastectomy and sentinel lymph node biopsy.             Pathology revealed a complete pathologic remission.             She is s/p removal of right tissue expander.             Clinically, she continues to do well.                 Exam reveals no evidence of recurrent disease.             Encourage monthly breast self-exam.             Left mammogram on 08/09/2020 revealed no evidence of malignancy.             She had a port removed by Dr. Su Monks is on 10/06/2020.             She is scheduled for breast reconstruction with tissue expanders by Dr. Joellyn Rued   Invasive carcinoma of breast ([place])  07/30/2019 Initial Diagnosis   Invasive carcinoma of breast ([place])   08/25/2019 - 02/17/2020 Chemotherapy   The patient had dexamethasone (DECADRON) 4 MG tablet, 8 mg, Oral, Daily, 1 of 1 cycle, Start date: --, End date: -- DOXOrubicin (ADRIAMYCIN) chemo injection 100 mg, 60 mg/m2 = 100 mg, Intravenous,  Once, 4 of 4 cycles Administration: 100 mg (12/15/2019), 100 mg (01/05/2020), 100 mg (01/26/2020), 100 mg (02/16/2020) palonosetron (ALOXI) injection 0.25 mg, 0.25 mg, Intravenous,  Once, 8 of 8 cycles Administration: 0.25 mg (08/25/2019), 0.25 mg (12/15/2019), 0.25 mg (09/01/2019), 0.25 mg (09/15/2019), 0.25 mg (01/05/2020), 0.25 mg (09/08/2019), 0.25 mg (09/22/2019), 0.25 mg (10/06/2019), 0.25 mg (09/29/2019),  0.25 mg (11/24/2019), 0.25 mg (10/20/2019), 0.25 mg (11/03/2019), 0.25 mg (10/27/2019), 0.25 mg (11/10/2019), 0.25 mg (11/17/2019), 0.25 mg (01/26/2020), 0.25 mg (02/16/2020) pegfilgrastim-jmdb (FULPHILA) injection 6 mg, 6 mg, Subcutaneous,  Once, 4 of 4 cycles Administration: 6 mg (12/16/2019), 6 mg (01/06/2020), 6 mg (01/27/2020), 6 mg (02/17/2020) CARBOplatin (PARAPLATIN) 220 mg in sodium chloride 0.9 % 250 mL chemo infusion, 220 mg (100 % of original dose 221.6 mg), Intravenous,  Once, 4 of 4 cycles Dose modification:   (original dose 221.6 mg, Cycle 1) Administration: 220 mg (08/25/2019), 220 mg (09/01/2019), 220 mg (09/08/2019), 220 mg (09/22/2019), 220 mg (10/06/2019), 220 mg (09/29/2019), 170 mg (11/24/2019), 170 mg (11/03/2019), 170 mg (11/10/2019), 170 mg (11/17/2019) cyclophosphamide (CYTOXAN) 1,000 mg in sodium chloride 0.9 % 250 mL chemo infusion, 600 mg/m2 = 1,000 mg (100 % of original dose 600 mg/m2), Intravenous,  Once, 4 of 4 cycles Dose modification: 600 mg/m2 (original dose 600 mg/m2, Cycle 5) Administration: 1,000 mg (12/15/2019), 1,000 mg (01/05/2020), 1,000 mg (01/26/2020), 1,000 mg (02/16/2020) PACLitaxel (TAXOL) 132 mg in sodium chloride 0.9 % 250 mL chemo infusion (</= 2m/m2), 80 mg/m2 = 132 mg, Intravenous,  Once, 4 of 4 cycles Administration: 132 mg (08/25/2019), 132 mg (09/01/2019), 132 mg (09/08/2019), 132 mg (09/22/2019), 132 mg (10/06/2019), 132  mg (09/29/2019), 132 mg (11/24/2019), 132 mg (10/20/2019), 132 mg (11/03/2019), 132 mg (11/10/2019), 132 mg (11/17/2019) fosaprepitant (EMEND) 150 mg in sodium chloride 0.9 % 145 mL IVPB, 150 mg, Intravenous,  Once, 4 of 4 cycles Administration: 150 mg (12/15/2019), 150 mg (01/05/2020), 150 mg (01/26/2020), 150 mg (02/16/2020)   for chemotherapy treatment.     Carcinoma of lower-outer quadrant of right breast in female, estrogen receptor negative ([place])  09/28/2019 Initial Diagnosis   Carcinoma of lower-outer quadrant of right breast in female, estrogen receptor negative ([place])       HISTORY OF PRESENTING ILLNESS:  Heather Bell 45 y.o.  female history of triple negative breast cancer stage II currently on surveillance; history of iron deficiency anemia is here for follow-up.  Patient denies any blood in stools or black or stools.  Denies any unusual shortness of breath or cough.  Denies any swelling in the legs.  Complains of fatigue.  Review of Systems  Constitutional:  Positive for malaise/fatigue. Negative for chills, diaphoresis, fever and weight loss.  HENT:  Negative for nosebleeds and sore throat.   Eyes:  Negative for double vision.  Respiratory:  Negative for cough, hemoptysis, sputum production, shortness of breath and wheezing.   Cardiovascular:  Negative for chest pain, palpitations, orthopnea and leg swelling.  Gastrointestinal:  Negative for abdominal pain, blood in stool, constipation, diarrhea, heartburn, melena, nausea and vomiting.  Genitourinary:  Negative for dysuria, frequency and urgency.  Musculoskeletal:  Positive for joint pain.  Skin: Negative.  Negative for itching and rash.  Neurological:  Negative for dizziness, tingling, focal weakness, weakness and headaches.  Endo/Heme/Allergies:  Does not bruise/bleed easily.  Psychiatric/Behavioral:  Negative for depression. The patient is not nervous/anxious and does not have insomnia.     MEDICAL HISTORY:  Past Medical History:  Diagnosis Date   Allergic rhinitis    Anxiety    Bipolar disorder ([place])    depression   Breast cancer ([place]) 03/21/2020   Right mastectomy   Cancer ([place])    breast right   Depression    Diabetes ([place])    GERD (gastroesophageal reflux disease)    Hyperlipidemia    Personal history of chemotherapy    Jan 2021 through July 2021    SURGICAL HISTORY: Past Surgical History:  Procedure Laterality Date   BREAST BIOPSY Right 07/22/2019   mass bx, path pending, heart marker   BREAST BIOPSY Right 07/22/2019   LN bx, path pending,  butterfly hydromarker    BREAST RECONSTRUCTION WITH PLACEMENT OF TISSUE EXPANDER AND FLEX HD (ACELLULAR HYDRATED DERMIS) Right 03/21/2020   Procedure: RIGHT BREAST RECONSTRUCTION WITH PLACEMENT OF TISSUE EXPANDER AND FLEX HD (ACELLULAR HYDRATED DERMIS);  Surgeon: [name], MD;  Location: [facility];  Service: Plastics;  Laterality: Right;   BREAST RECONSTRUCTION WITH PLACEMENT OF TISSUE EXPANDER AND FLEX HD (ACELLULAR HYDRATED DERMIS) Right 12/27/2020   Procedure: BREAST RECONSTRUCTION WITH PLACEMENT OF TISSUE EXPANDER AND FLEX HD (ACELLULAR HYDRATED DERMIS);  Surgeon: [name], MD;  Location: [place];  Service: Plastics;  Laterality: Right;   IMAGE GUIDED SINUS SURGERY     INCISION AND DRAINAGE OF WOUND Right 05/02/2020   Procedure: IRRIGATION AND DEBRIDEMENT WOUND WITH REMOVAL OF RIGHT TISSUE EXPANDER;  Surgeon: [name], MD;  Location: [place];  Service: Plastics;  Laterality: Right;  1 hour please   KNEE SURGERY     MASTECTOMY Right 03/21/2020   NASAL SEPTUM SURGERY     PORTACATH  PLACEMENT N/A 08/07/2019   Procedure: INSERTION PORT-A-CATH;  Surgeon: Herbert Pun, MD;  Location: [facility];  Service: General;  Laterality: N/A;   TONSILLECTOMY     TOTAL MASTECTOMY Right 03/21/2020   Procedure: TOTAL MASTECTOMY;  Surgeon: Herbert Pun, MD;  Location: [facility];  Service: General;  Laterality: Right;    SOCIAL HISTORY: Social History   Socioeconomic History   Marital status: Married    Spouse name: Not on file   Number of children: Not on file   Years of education: Not on file   Highest education level: Not on file  Occupational History   Not on file  Tobacco Use   Smoking status: Never   Smokeless tobacco: Never  Vaping Use   Vaping Use: Never used  Substance and Sexual Activity   Alcohol use: No    Alcohol/week: 0.0 standard drinks   Drug use: No   Sexual activity: Not on file  Other Topics Concern   Not on file  Social History  Narrative   Not on file   Social Determinants of Health   Financial Resource Strain: Not on file  Food Insecurity: Not on file  Transportation Needs: Not on file  Physical Activity: Not on file  Stress: Not on file  Social Connections: Not on file  Intimate Partner Violence: Not on file    FAMILY HISTORY: Family History  Problem Relation Age of Onset   Lung cancer Father    Heart attack Father    Lung cancer Paternal Aunt    Lung cancer Paternal Uncle    Heart disease Paternal Uncle    Breast cancer Neg Hx     ALLERGIES:  is allergic to compazine [prochlorperazine edisylate], morphine and related, famotidine, doxycycline, fluticasone, and levofloxacin.  MEDICATIONS:  Current Outpatient Medications  Medication Sig Dispense Refill   Accu-Chek FastClix Lancets MISC See admin instructions.     ACCU-CHEK GUIDE test strip SMARTSIG:1 Strip(s) Via Meter Daily PRN     amphetamine-dextroamphetamine (ADDERALL XR) 25 MG 24 hr capsule Take 25 mg by mouth daily.     Blood Glucose Monitoring Suppl (ACCU-CHEK GUIDE ME) w/Device KIT USE 1 DEVICE ONCE DAILY AS DIRECTED     buPROPion (WELLBUTRIN XL) 150 MG 24 hr tablet Take 150 mg by mouth every morning.     Calcium Carb-Cholecalciferol (CALCIUM 600+D) 600-800 MG-UNIT TABS Take 1 tablet by mouth daily.     cetirizine (ZYRTEC) 10 MG tablet Take 10 mg by mouth daily.      doxepin (SINEQUAN) 25 MG capsule Take 25-50 mg by mouth at bedtime.      ibuprofen (ADVIL) 200 MG tablet Take 400-800 mg by mouth every 6 (six) hours as needed for headache or moderate pain.     lamoTRIgine (LAMICTAL) 150 MG tablet Take 150 mg by mouth daily.     lurasidone (LATUDA) 40 MG TABS tablet Take 40 mg by mouth at bedtime.      Lurasidone HCl 60 MG TABS Take 60 mg by mouth at bedtime.      metFORMIN (GLUCOPHAGE-XR) 500 MG 24 hr tablet Take 500 mg by mouth 2 (two) times daily.     mometasone (NASONEX) 50 MCG/ACT nasal spray Place 2 sprays into the nose daily as needed  (allergies).      Multiple Vitamin (MULTIVITAMIN WITH MINERALS) TABS tablet Take 1 tablet by mouth daily.     omeprazole (PRILOSEC) 20 MG capsule Take 20 mg by mouth daily before breakfast.      QUEtiapine (  SEROQUEL) 300 MG tablet Take 300 mg by mouth at bedtime.     simvastatin (ZOCOR) 20 MG tablet Take 20 mg by mouth daily.     topiramate (TOPAMAX) 100 MG tablet Take 50-100 mg by mouth See admin instructions. Take 1 tablet (100 mg) by mouth in the morning & take 0.5 tablet (50 mg) by mouth at night.     vitamin B-12 (CYANOCOBALAMIN) 1000 MCG tablet Take 1,000 mcg by mouth daily.     diazepam (VALIUM) 10 MG tablet Take 10 mg by mouth. As needed prior to procedures     No current facility-administered medications for this visit.   Facility-Administered Medications Ordered in Other Visits  Medication Dose Route Frequency Provider Last Rate Last Admin   0.9 %  sodium chloride infusion   Intravenous Continuous Lequita Asal, MD   Stopped at 01/08/20 1516   sodium chloride flush (NS) 0.9 % injection 10 mL  10 mL Intravenous PRN Nolon Stalls C, MD   10 mL at 01/08/20 1314      .  PHYSICAL EXAMINATION: ECOG PERFORMANCE STATUS: 0 - Asymptomatic  Vitals:   03/21/21 0840  BP: 100/66  Pulse: 94  Resp: 16  Temp: 98.2 F (36.8 C)  SpO2: 100%   Filed Weights   03/21/21 0840  Weight: 157 lb 4.8 oz (71.3 kg)    Physical Exam Vitals and nursing note reviewed.  Constitutional:      Comments:       HENT:     Head: Normocephalic and atraumatic.     Mouth/Throat:     Pharynx: Oropharynx is clear.  Eyes:     Extraocular Movements: Extraocular movements intact.     Pupils: Pupils are equal, round, and reactive to light.  Cardiovascular:     Rate and Rhythm: Normal rate and regular rhythm.  Pulmonary:     Comments: Decreased breath sounds bilaterally.  Abdominal:     Palpations: Abdomen is soft.  Musculoskeletal:        General: Normal range of motion.     Cervical  back: Normal range of motion.  Skin:    General: Skin is warm.  Neurological:     General: No focal deficit present.     Mental Status: She is alert and oriented to person, place, and time.  Psychiatric:        Behavior: Behavior normal.        Judgment: Judgment normal.    LABORATORY DATA:  I have reviewed the data as listed Lab Results  Component Value Date   WBC 8.5 03/21/2021   HGB 11.5 (L) 03/21/2021   HCT 35.6 (L) 03/21/2021   MCV 88.8 03/21/2021   PLT 321 03/21/2021   Recent Labs    09/20/20 0943 12/19/20 0916 03/21/21 0827  NA 139 139 140  K 3.8 3.5 3.3*  CL 106 105 109  CO2 '24 25 25  ' GLUCOSE 114* 90 127*  BUN '16 14 13  ' CREATININE 0.76 0.73 0.87  CALCIUM 9.1 9.4 9.3  GFRNONAA >60 >60 >60  PROT 7.4 7.4 7.4  ALBUMIN 4.2 4.1 4.1  AST 15 13* 16  ALT '11 11 13  ' ALKPHOS 62 70 76  BILITOT 0.5 0.4 0.4    RADIOGRAPHIC STUDIES: I have personally reviewed the radiological images as listed and agreed with the findings in the report. No results found.  ASSESSMENT & PLAN:   Carcinoma of lower-outer quadrant of right breast in female, estrogen receptor negative ([place]) #  Clinical  stage IIB triple negative right breast cancer: s/p neoadjuvant chemotherapy followed by right total mastectomy and sentinel lymph node biopsy-pathology complete pathologic response.  Clinically no evidence of recurrence.  December 2021 mammogram left side normal.  Reconstruction-right breast May 2022.  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  Clinically stable.  #  Normocytic anemia: S/p IV iron infusion- [feb 2021]             # B12 deficiency: B12 deficiency on p.o. B12  #Fatigue: Recommend care program  # Genetics-discuss  # DISPOSITION: # follow up in 3 months- MD; labs- cbc/cmp/ca27-29-Dr.B   All questions were answered. The patient knows to call the clinic with  any problems, questions or concerns.    Cammie Sickle, MD 04/09/2021 10:08 PM

## 2021-03-22 LAB — CANCER ANTIGEN 27.29: CA 27.29: 6.1 U/mL (ref 0.0–38.6)

## 2021-03-23 ENCOUNTER — Other Ambulatory Visit: Payer: Self-pay

## 2021-03-23 DIAGNOSIS — Z171 Estrogen receptor negative status [ER-]: Secondary | ICD-10-CM

## 2021-03-23 NOTE — Progress Notes (Signed)
Daily Session Note  Patient Details  Name: Heather Bell MRN: [id number] Date of Birth: Sep 13, 1975 Referring Provider:   April Manson Cancer Associated Rehabilitation & Exercise from 02/14/2021 in [facility] Cardiac and Pulmonary Rehab  Referring Provider [name]       Encounter Date: 03/23/2021  Check In:  Session Check In - 03/23/21 1311       Check-In   Supervising physician immediately available to respond to emergencies See telemetry face sheet for immediately available ER MD    Location [facility]-Cardiac & Pulmonary Rehab    Staff Present Coralie Keens, MS, ASCM CEP, Exercise Physiologist;Ladarrion Telfair Oletta Darter, BA, ACSM CEP, Exercise Physiologist    Virtual Visit No    Medication changes reported     No    Fall or balance concerns reported    No    Warm-up and Cool-down Performed on first and last piece of equipment    Resistance Training Performed Yes    VAD Patient? No    PAD/SET Patient? No      Pain Assessment   Currently in Pain? No/denies    Multiple Pain Sites No                Social History   Tobacco Use  Smoking Status Never  Smokeless Tobacco Never    Goals Met:  Proper associated with RPD/PD & O2 Sat Independence with exercise equipment Exercise tolerated well Strength training completed today  Goals Unmet:  Not Applicable  Comments: Pt able to follow exercise prescription today without complaint.  Will continue to monitor for progression.    Dr. Teyanna Filbert is Medical Director for [facility].  Dr. Ottie Glazier is Medical Director for [facility] Pulmonary Rehabilitation.

## 2021-03-28 ENCOUNTER — Ambulatory Visit: Payer: BC Managed Care – PPO | Admitting: Plastic Surgery

## 2021-03-28 ENCOUNTER — Other Ambulatory Visit: Payer: Self-pay

## 2021-03-28 DIAGNOSIS — C50511 Malignant neoplasm of lower-outer quadrant of right female breast: Secondary | ICD-10-CM | POA: Diagnosis present

## 2021-03-28 DIAGNOSIS — Z171 Estrogen receptor negative status [ER-]: Secondary | ICD-10-CM | POA: Diagnosis present

## 2021-03-28 LAB — GLUCOSE, CAPILLARY: Glucose-Capillary: 169 mg/dL — ABNORMAL HIGH (ref 70–99)

## 2021-03-28 NOTE — Progress Notes (Signed)
Daily Session Note  Patient Details  Name: [name] MRN: [id number] Date of Birth: 04/25/1976 Referring Provider:   April Manson Cancer Associated Rehabilitation & Exercise from 02/14/2021 in [facility] Cardiac and Pulmonary Rehab  Referring Provider [name]       Encounter Date: 03/28/2021  Check In:  Session Check In - 03/28/21 1226       Check-In   Supervising physician immediately available to respond to emergencies See telemetry face sheet for immediately available ER MD    Location [facility]-Cardiac & Pulmonary Rehab    Staff Present Birdie Sons, MPA, RN;Jessica [place], MA, RCEP, CCRP, Marylynn Pearson, MS, ASCM CEP, Exercise Physiologist    Virtual Visit No    Medication changes reported     No    Fall or balance concerns reported    No    Warm-up and Cool-down Performed on first and last piece of equipment    Resistance Training Performed Yes    VAD Patient? No    PAD/SET Patient? No      Pain Assessment   Currently in Pain? No/denies                Social History   Tobacco Use  Smoking Status Never  Smokeless Tobacco Never    Goals Met:  Proper associated with RPD/PD & O2 Sat Independence with exercise equipment Exercise tolerated well No report of cardiac concerns or symptoms Strength training completed today  Goals Unmet:  Not Applicable  Comments: Pt able to follow exercise prescription today without complaint.  Will continue to monitor for progression.    Dr. Natally Filbert is Medical Director for [place].  Dr. Ottie Glazier is Medical Director for [facility] Pulmonary Rehabilitation.

## 2021-03-31 ENCOUNTER — Ambulatory Visit (INDEPENDENT_AMBULATORY_CARE_PROVIDER_SITE_OTHER): Payer: BC Managed Care – PPO | Admitting: Surgical

## 2021-03-31 ENCOUNTER — Other Ambulatory Visit: Payer: Self-pay

## 2021-03-31 DIAGNOSIS — C50911 Malignant neoplasm of unspecified site of right female breast: Secondary | ICD-10-CM

## 2021-03-31 DIAGNOSIS — C773 Secondary and unspecified malignant neoplasm of axilla and upper limb lymph nodes: Secondary | ICD-10-CM | POA: Diagnosis not present

## 2021-03-31 NOTE — Progress Notes (Signed)
Patient is a 45 year old female here for follow-up on her right breast reconstruction with placement of tissue expander with Dr. Claudia Desanctis on 12/27/2020.  She is 3 months postop.  She has had delayed expansion due to skin tightness.  She was last seen in the office on 02/24/2021, she reports that she was unable to make her last visit because her son was sick.  He is doing much better now.  Chaperone present on exam On exam patient is well-developed, well-nourished, no acute distress.  Breathing is unlabored. On exam right breast incision is intact, incision is well-healed.  There is no erythema.  There is no cellulitic changes.  There is no subcutaneous fluid collections noted with palpation.  There is no foul odors.  We placed injectable saline in the Expander using a sterile technique: Right: 75 cc for a total of 200 / 300 cc  Patient is doing well, recommend following up in 2 weeks for additional fill.  No sign of infection, seroma, hematoma.

## 2021-04-06 ENCOUNTER — Other Ambulatory Visit: Payer: Self-pay

## 2021-04-06 DIAGNOSIS — C50511 Malignant neoplasm of lower-outer quadrant of right female breast: Secondary | ICD-10-CM

## 2021-04-06 NOTE — Progress Notes (Signed)
Daily Session Note  Patient Details  Name: Heather Bell MRN: [id number] Date of Birth: [birth year] Referring Provider:   April Manson Cancer Associated Rehabilitation & Exercise from 02/14/2021 in [facility] Cardiac and Pulmonary Rehab  Referring Provider [name]       Encounter Date: 04/06/2021  Check In:  Session Check In - 04/06/21 1221       Check-In   Supervising physician immediately available to respond to emergencies See telemetry face sheet for immediately available ER MD    Location [facility]-Cardiac & Pulmonary Rehab    Staff Present Birdie Sons, MPA, Nino Glow, MS, ASCM CEP, Exercise Physiologist;Amanda Oletta Darter, BA, ACSM CEP, Exercise Physiologist    Virtual Visit No    Medication changes reported     No    Fall or balance concerns reported    No    Warm-up and Cool-down Performed on first and last piece of equipment    Resistance Training Performed Yes    VAD Patient? No    PAD/SET Patient? No      Pain Assessment   Currently in Pain? No/denies                Social History   Tobacco Use  Smoking Status Never  Smokeless Tobacco Never    Goals Met:  Independence with exercise equipment Exercise tolerated well No report of cardiac concerns or symptoms Strength training completed today  Goals Unmet:  Not Applicable  Comments: Pt able to follow exercise prescription today without complaint.  Will continue to monitor for progression.    Dr. Meily Filbert is Medical Director for [facility].  Dr. Ottie Glazier is Medical Director for [facility] Pulmonary Rehabilitation.

## 2021-04-09 ENCOUNTER — Encounter: Payer: Self-pay | Admitting: Hematology and Oncology

## 2021-04-11 ENCOUNTER — Other Ambulatory Visit: Payer: Self-pay

## 2021-04-11 DIAGNOSIS — C50511 Malignant neoplasm of lower-outer quadrant of right female breast: Secondary | ICD-10-CM

## 2021-04-11 DIAGNOSIS — Z171 Estrogen receptor negative status [ER-]: Secondary | ICD-10-CM

## 2021-04-11 NOTE — Progress Notes (Signed)
Daily Session Note  Patient Details  Name: Heather Bell MRN: [id number] Date of Birth: 18-Nov-1975 Referring Provider:   April Manson Cancer Associated Rehabilitation & Exercise from 02/14/2021 in [facility] Cardiac and Pulmonary Rehab  Referring Provider [name]       Encounter Date: 04/11/2021  Check In:  Session Check In - 04/11/21 1219       Check-In   Supervising physician immediately available to respond to emergencies See telemetry face sheet for immediately available ER MD    Location [facility]-Cardiac & Pulmonary Rehab    Staff Present Birdie Sons, MPA, Nino Glow, MS, ASCM CEP, Exercise Physiologist;Jessica Luan [place], MA, RCEP, CCRP, CCET    Virtual Visit No    Medication changes reported     No    Fall or balance concerns reported    No    Warm-up and Cool-down Performed on first and last piece of equipment    Resistance Training Performed Yes    VAD Patient? No    PAD/SET Patient? No      Pain Assessment   Currently in Pain? No/denies                Social History   Tobacco Use  Smoking Status Never  Smokeless Tobacco Never    Goals Met:  Independence with exercise equipment Exercise tolerated well No report of cardiac concerns or symptoms Strength training completed today  Goals Unmet:  Not Applicable  Comments: Pt able to follow exercise prescription today without complaint.  Will continue to monitor for progression.    Dr. Irini Filbert is Medical Director for [place].  Dr. Ottie Glazier is Medical Director for [facility] Pulmonary Rehabilitation.

## 2021-04-13 ENCOUNTER — Ambulatory Visit (INDEPENDENT_AMBULATORY_CARE_PROVIDER_SITE_OTHER): Payer: BC Managed Care – PPO | Admitting: Plastic Surgery

## 2021-04-13 ENCOUNTER — Other Ambulatory Visit: Payer: Self-pay

## 2021-04-13 DIAGNOSIS — Z171 Estrogen receptor negative status [ER-]: Secondary | ICD-10-CM

## 2021-04-13 DIAGNOSIS — C773 Secondary and unspecified malignant neoplasm of axilla and upper limb lymph nodes: Secondary | ICD-10-CM

## 2021-04-13 DIAGNOSIS — C50511 Malignant neoplasm of lower-outer quadrant of right female breast: Secondary | ICD-10-CM

## 2021-04-13 DIAGNOSIS — C50911 Malignant neoplasm of unspecified site of right female breast: Secondary | ICD-10-CM

## 2021-04-13 NOTE — Progress Notes (Signed)
CARE Daily Session Note  Patient Details  Name: Heather Bell MRN: [id number] Date of Birth: May 24, 1976 Referring Provider:   April Manson Cancer Associated Rehabilitation & Exercise from 02/14/2021 in [facility] Cardiac and Pulmonary Rehab  Referring Provider [name]       Encounter Date: 04/13/2021  Check In:  Session Check In - 04/13/21 1238       Check-In   Supervising physician immediately available to respond to emergencies See telemetry face sheet for immediately available ER MD    Location [facility]-Cardiac & Pulmonary Rehab    Staff Present Nada Maclachlan, BA, ACSM CEP, Exercise Physiologist;Shawn Carattini Eliezer Bottom, MS, ASCM CEP, Exercise Physiologist    Virtual Visit No    Medication changes reported     No    Fall or balance concerns reported    No    Warm-up and Cool-down Performed on first and last piece of equipment    Resistance Training Performed No   yoga   VAD Patient? No    PAD/SET Patient? No                Social History   Tobacco Use  Smoking Status Never  Smokeless Tobacco Never    Goals Met:  Proper associated with RPD/PD & O2 Sat Exercise tolerated well No report of concerns or symptoms today  Goals Unmet:  Not Applicable  Comments: Pt able to follow exercise prescription today without complaint.  Will continue to monitor for progression.   Yoga completed today by Nada Maclachlan, Exercise Physiologist.   Dr. Canisha Filbert is Medical Director for [place].  Dr. Ottie Glazier is Medical Director for [facility] Pulmonary Rehabilitation.

## 2021-04-13 NOTE — Progress Notes (Signed)
Patient presents for follow-up fill of her right tissue expander.  She currently has 200 cc and a 300 cc expander.  She did not have any problems last time.  On exam the incision looks good and the skin looks healthy.  75 cc was infiltrated for a new volume of 275 cc and a 300 cc expander.  I believe she will require an overfill of her expander to try and match the left side is much as possible.  We will plan to do 75 cc again next visit and see how that looks.  All her questions were answered.

## 2021-04-18 ENCOUNTER — Other Ambulatory Visit: Payer: Self-pay

## 2021-04-18 ENCOUNTER — Ambulatory Visit: Payer: BC Managed Care – PPO | Admitting: Surgical

## 2021-04-18 DIAGNOSIS — C50511 Malignant neoplasm of lower-outer quadrant of right female breast: Secondary | ICD-10-CM

## 2021-04-18 NOTE — Progress Notes (Signed)
Daily Session Note  Patient Details  Name: Heather Bell MRN: [id number] Date of Birth: 06/16/1976 Referring Provider:   April Manson Cancer Associated Rehabilitation & Exercise from 02/14/2021 in [facility] Cardiac and Pulmonary Rehab  Referring Provider [name]       Encounter Date: 04/18/2021  Check In:  Session Check In - 04/18/21 1229       Check-In   Supervising physician immediately available to respond to emergencies See telemetry face sheet for immediately available ER MD    Location [facility]Cardiac & Pulmonary Rehab    Staff Present Birdie Sons, MPA, Nino Glow, MS, ASCM CEP, Exercise Physiologist;Jessica Luan Pulling, MA, RCEP, CCRP, CCET    Virtual Visit No    Medication changes reported     No    Fall or balance concerns reported    No    Warm-up and Cool-down Performed on first and last piece of equipment    Resistance Training Performed Yes    VAD Patient? No    PAD/SET Patient? No      Pain Assessment   Currently in Pain? No/denies                Social History   Tobacco Use  Smoking Status Never  Smokeless Tobacco Never    Goals Met:  Independence with exercise equipment Exercise tolerated well No report of concerns or symptoms today Strength training completed today  Goals Unmet:  Not Applicable  Comments: Pt able to follow exercise prescription today without complaint.  Will continue to monitor for progression.    Dr. Mazzy Filbert is Medical Director for [place].  Dr. Ottie Glazier is Medical Director for [facility] Pulmonary Rehabilitation.

## 2021-04-20 ENCOUNTER — Other Ambulatory Visit: Payer: Self-pay

## 2021-04-20 ENCOUNTER — Encounter: Payer: BC Managed Care – PPO | Attending: Oncology

## 2021-04-20 DIAGNOSIS — Z171 Estrogen receptor negative status [ER-]: Secondary | ICD-10-CM | POA: Insufficient documentation

## 2021-04-20 DIAGNOSIS — C50511 Malignant neoplasm of lower-outer quadrant of right female breast: Secondary | ICD-10-CM | POA: Insufficient documentation

## 2021-04-20 NOTE — Progress Notes (Signed)
Daily Session Note  Patient Details  Name: Heather Bell MRN: [id number] Date of Birth: [birth year] Referring Provider:   April Manson Cancer Associated Rehabilitation & Exercise from 02/14/2021 in [facility] Cardiac and Pulmonary Rehab  Referring Provider [name]       Encounter Date: 04/20/2021  Check In:  Session Check In - 04/20/21 1204       Check-In   Supervising physician immediately available to respond to emergencies See telemetry face sheet for immediately available ER MD    Location [facility]    Staff Present Birdie Sons, MPA, RN;Melissa [place], RDN, Rowe Pavy, BA, ACSM CEP, Exercise Physiologist    Virtual Visit No    Medication changes reported     No    Fall or balance concerns reported    No    Warm-up and Cool-down Performed on first and last piece of equipment    Resistance Training Performed Yes    VAD Patient? No    PAD/SET Patient? No      Pain Assessment   Currently in Pain? No/denies                Social History   Tobacco Use  Smoking Status Never  Smokeless Tobacco Never    Goals Met:  Independence with exercise equipment Exercise tolerated well No report of concerns or symptoms today Strength training completed today  Goals Unmet:  Not Applicable  Comments: Pt able to follow exercise prescription today without complaint.  Will continue to monitor for progression.    Dr. Dawanna Filbert is Medical Director for [place].  Dr. Ottie Glazier is Medical Director for [facility] Pulmonary Rehabilitation.

## 2021-04-25 ENCOUNTER — Encounter: Payer: BC Managed Care – PPO | Admitting: *Deleted

## 2021-04-25 ENCOUNTER — Other Ambulatory Visit: Payer: Self-pay

## 2021-04-25 DIAGNOSIS — C50511 Malignant neoplasm of lower-outer quadrant of right female breast: Secondary | ICD-10-CM

## 2021-04-25 NOTE — Progress Notes (Signed)
Daily Session Note  Patient Details  Name: Heather Bell MRN: [id number] Date of Birth: May 31, 1976 Referring Provider:   April Manson Cancer Associated Rehabilitation & Exercise from 02/14/2021 in [facility] Cardiac and Pulmonary Rehab  Referring Provider [name]       Encounter Date: 04/25/2021  Check In:  Session Check In - 04/25/21 1242       Check-In   Supervising physician immediately available to respond to emergencies See telemetry face sheet for immediately available ER MD    Location [facility]    Staff Present Alberteen Sam, MA, RCEP, CCRP, CCET;Laureen Clayton, BS, RRT, CPFT    Virtual Visit No    Medication changes reported     No    Fall or balance concerns reported    No    Warm-up and Cool-down Performed on first and last piece of equipment    Resistance Training Performed Yes    VAD Patient? No    PAD/SET Patient? No      Pain Assessment   Currently in Pain? No/denies                Social History   Tobacco Use  Smoking Status Never  Smokeless Tobacco Never    Goals Met:  Proper associated with RPD/PD & O2 Sat Independence with exercise equipment Exercise tolerated well No report of concerns or symptoms today Strength training completed today  Goals Unmet:  Not Applicable  Comments: Pt able to follow exercise prescription today without complaint.  Will continue to monitor for progression.    Dr. Devine Filbert is Medical Director for [place].  Dr. Ottie Glazier is Medical Director for [facility] Pulmonary Rehabilitation.

## 2021-04-27 ENCOUNTER — Ambulatory Visit (INDEPENDENT_AMBULATORY_CARE_PROVIDER_SITE_OTHER): Payer: BC Managed Care – PPO | Admitting: Plastic Surgery

## 2021-04-27 ENCOUNTER — Other Ambulatory Visit: Payer: Self-pay

## 2021-04-27 ENCOUNTER — Encounter: Payer: Self-pay | Admitting: Plastic Surgery

## 2021-04-27 DIAGNOSIS — C50911 Malignant neoplasm of unspecified site of right female breast: Secondary | ICD-10-CM

## 2021-04-27 DIAGNOSIS — C50511 Malignant neoplasm of lower-outer quadrant of right female breast: Secondary | ICD-10-CM

## 2021-04-27 DIAGNOSIS — C773 Secondary and unspecified malignant neoplasm of axilla and upper limb lymph nodes: Secondary | ICD-10-CM

## 2021-04-27 NOTE — Progress Notes (Signed)
CARE Daily Session Note  Patient Details  Name: Heather Bell MRN: [id number] Date of Birth: [birth year] Referring Provider:   April Manson Cancer Associated Rehabilitation & Exercise from 02/14/2021 in [facility] Cardiac and Pulmonary Rehab  Referring Provider [name]       Encounter Date: 04/27/2021  Check In:  Session Check In - 04/27/21 1250       Check-In   Supervising physician immediately available to respond to emergencies See telemetry face sheet for immediately available ER MD    Location [facility]    Staff Present Nada Maclachlan, BA, ACSM CEP, Exercise Physiologist;Lashonda Sonneborn Eliezer Bottom, MS, ASCM CEP, Exercise Physiologist    Virtual Visit No    Medication changes reported     No    Fall or balance concerns reported    No    Warm-up and Cool-down Performed on first and last piece of equipment    Resistance Training Performed Yes    VAD Patient? No    PAD/SET Patient? No                Social History   Tobacco Use  Smoking Status Never  Smokeless Tobacco Never    Goals Met:  Independence with exercise equipment Exercise tolerated well No report of concerns or symptoms today Strength training completed today  Goals Unmet:  Not Applicable  Comments: Pt able to follow exercise prescription today without complaint.  Will continue to monitor for progression.    Dr. Idaliz Filbert is Medical Director for [place].  Dr. Ottie Glazier is Medical Director for [facility] Pulmonary Rehabilitation.

## 2021-04-27 NOTE — Progress Notes (Signed)
Patient presents for additional fill of her right tissue expander.  She currently has 275 cc and a 300 cc expander.  75 additional cc were infiltrated to give her a new volume of 350 cc and a 300 cc expander.  I believe 1 additional fill of 50 to 75 cc would be appropriate.  Compared to her natural left side the footprint on the right side is quite a bit narrower.  I will be able to expand this to some degree by releasing the periphery of the pocket and placing a lower profile implant.  She will also likely need a lift/small reduction on the left side to help with symmetry.  I explained this to her and she is in agreement with this plan.  We will see her in 2 weeks for additional fill and discussed the rest of the details of surgery.

## 2021-05-02 ENCOUNTER — Other Ambulatory Visit: Payer: Self-pay

## 2021-05-02 DIAGNOSIS — C50511 Malignant neoplasm of lower-outer quadrant of right female breast: Secondary | ICD-10-CM

## 2021-05-02 NOTE — Progress Notes (Signed)
Daily Session Note  Patient Details  Name: Heather Bell MRN: [id number] Date of Birth: [birth year] Referring Provider:   April Manson Cancer Associated Rehabilitation & Exercise from 02/14/2021 in [facility] Cardiac and Pulmonary Rehab  Referring Provider [name]       Encounter Date: 05/02/2021  Check In:  Session Check In - 05/02/21 1243       Check-In   Supervising physician immediately available to respond to emergencies See telemetry face sheet for immediately available ER MD    Location [facility]    Staff Present Birdie Sons, MPA, RN;Jessica Cullen, MA, RCEP, CCRP, Marylynn Pearson, MS, ASCM CEP, Exercise Physiologist    Virtual Visit No    Medication changes reported     No    Fall or balance concerns reported    No    Warm-up and Cool-down Performed on first and last piece of equipment    Resistance Training Performed Yes    VAD Patient? No    PAD/SET Patient? No      Pain Assessment   Currently in Pain? No/denies                Social History   Tobacco Use  Smoking Status Never  Smokeless Tobacco Never    Goals Met:  Independence with exercise equipment Exercise tolerated well No report of concerns or symptoms today Strength training completed today  Goals Unmet:  Not Applicable  Comments: Pt able to follow exercise prescription today without complaint.  Will continue to monitor for progression.    Dr. Ladeana Filbert is Medical Director for [place].  Dr. Ottie Glazier is Medical Director for [facility] Pulmonary Rehabilitation.

## 2021-05-04 ENCOUNTER — Other Ambulatory Visit: Payer: Self-pay

## 2021-05-04 DIAGNOSIS — C50511 Malignant neoplasm of lower-outer quadrant of right female breast: Secondary | ICD-10-CM

## 2021-05-04 DIAGNOSIS — Z171 Estrogen receptor negative status [ER-]: Secondary | ICD-10-CM

## 2021-05-04 NOTE — Progress Notes (Signed)
Daily Session Note  Patient Details  Name: Heather Bell MRN: [id number] Date of Birth: 05-23-1976 Referring Provider:   April Manson Cancer Associated Rehabilitation & Exercise from 02/14/2021 in [facility] Cardiac and Pulmonary Rehab  Referring Provider [name]       Encounter Date: 05/04/2021  Check In:  Session Check In - 05/04/21 1215       Check-In   Supervising physician immediately available to respond to emergencies See telemetry face sheet for immediately available ER MD    Location [facility]    Staff Present Birdie Sons, MPA, Nino Glow, MS, ASCM CEP, Exercise Physiologist;Amanda Oletta Darter, BA, ACSM CEP, Exercise Physiologist    Virtual Visit No    Medication changes reported     No    Fall or balance concerns reported    No    Warm-up and Cool-down Performed on first and last piece of equipment    Resistance Training Performed Yes    VAD Patient? No    PAD/SET Patient? No      Pain Assessment   Currently in Pain? No/denies                Social History   Tobacco Use  Smoking Status Never  Smokeless Tobacco Never    Goals Met:  Independence with exercise equipment Exercise tolerated well No report of concerns or symptoms today Strength training completed today  Goals Unmet:  Not Applicable  Comments: Pt able to follow exercise prescription today without complaint.  Will continue to monitor for progression.    Dr. Arletha Filbert is Medical Director for [place].  Dr. Ottie Glazier is Medical Director for [facility] Pulmonary Rehabilitation.

## 2021-05-10 ENCOUNTER — Encounter: Payer: Self-pay | Admitting: Plastic Surgery

## 2021-05-10 ENCOUNTER — Ambulatory Visit (INDEPENDENT_AMBULATORY_CARE_PROVIDER_SITE_OTHER): Payer: BC Managed Care – PPO | Admitting: Plastic Surgery

## 2021-05-10 ENCOUNTER — Other Ambulatory Visit: Payer: Self-pay

## 2021-05-10 DIAGNOSIS — C773 Secondary and unspecified malignant neoplasm of axilla and upper limb lymph nodes: Secondary | ICD-10-CM

## 2021-05-10 DIAGNOSIS — C50911 Malignant neoplasm of unspecified site of right female breast: Secondary | ICD-10-CM

## 2021-05-10 NOTE — Progress Notes (Signed)
Patient presents to discuss continued fills on her right tissue expander.  She currently has 350 cc and a 300 cc expander.  She wants to be a bit bigger and I do think we could use more volume to help match the left side.  50 cc was added for current volume of 400 cc and a 300 cc expander.  At this point she is ready to move forward with exchange and a reduction on the left side.  While the footprint on the right side is appropriate for her chest the footprint on the natural left side is much wider.  I will plan to do capsulotomies superiorly and laterally to allow a broader base to the implant which will help with symmetry.  We will also do a reduction on the left to make it perkier which should also help with symmetry.  If she needs fat grafting we will plan to do that down the line.  We discussed the risks and benefits of this procedure she is fully understanding and wants to move forward.

## 2021-05-11 ENCOUNTER — Ambulatory Visit: Payer: BC Managed Care – PPO | Admitting: Surgical

## 2021-05-11 DIAGNOSIS — C50511 Malignant neoplasm of lower-outer quadrant of right female breast: Secondary | ICD-10-CM

## 2021-05-11 NOTE — Progress Notes (Signed)
Daily Session Note  Patient Details  Name: Heather Bell MRN: [id number] Date of Birth: 09-Aug-1976 Referring Provider:   April Manson Cancer Associated Rehabilitation & Exercise from 02/14/2021 in [facility] Cardiac and Pulmonary Rehab  Referring Provider [name]       Encounter Date: 05/11/2021  Check In:  Session Check In - 05/11/21 1247       Check-In   Supervising physician immediately available to respond to emergencies See telemetry face sheet for immediately available ER MD    Location [facility]    Staff Present Birdie Sons, MPA, RN;Melissa Villarreal, RDN, LDN;Meredith Sherryll Burger, RN Vickki Hearing, BA, ACSM CEP, Exercise Physiologist    Virtual Visit No    Medication changes reported     No    Fall or balance concerns reported    No    Warm-up and Cool-down Performed on first and last piece of equipment    Resistance Training Performed Yes    VAD Patient? No    PAD/SET Patient? No      Pain Assessment   Currently in Pain? No/denies                Social History   Tobacco Use  Smoking Status Never  Smokeless Tobacco Never    Goals Met:  Independence with exercise equipment Exercise tolerated well No report of concerns or symptoms today Strength training completed today  Goals Unmet:  Not Applicable  Comments: Pt able to follow exercise prescription today without complaint.  Will continue to monitor for progression.    Dr. Gladiola Filbert is Medical Director for [place].  Dr. Ottie Glazier is Medical Director for [facility] Pulmonary Rehabilitation.

## 2021-05-16 ENCOUNTER — Other Ambulatory Visit: Payer: Self-pay

## 2021-05-16 DIAGNOSIS — Z171 Estrogen receptor negative status [ER-]: Secondary | ICD-10-CM

## 2021-05-16 DIAGNOSIS — C50511 Malignant neoplasm of lower-outer quadrant of right female breast: Secondary | ICD-10-CM

## 2021-05-16 NOTE — Progress Notes (Signed)
Daily Session Note  Patient Details  Name: Heather Bell MRN: [id number] Date of Birth: Jun 24, 1976 Referring Provider:   April Manson Cancer Associated Rehabilitation & Exercise from 02/14/2021 in [facility] Cardiac and Pulmonary Rehab  Referring Provider [name]       Encounter Date: 05/16/2021  Check In:  Session Check In - 05/16/21 1208       Check-In   Supervising physician immediately available to respond to emergencies See telemetry face sheet for immediately available ER MD    Location [facility]    Staff Present Birdie Sons, MPA, RN;Jessica Luan Pulling, MA, RCEP, CCRP, Marylynn Pearson, MS, ASCM CEP, Exercise Physiologist    Virtual Visit No    Medication changes reported     No    Fall or balance concerns reported    No    Warm-up and Cool-down Performed on first and last piece of equipment    Resistance Training Performed Yes    VAD Patient? No    PAD/SET Patient? No      Pain Assessment   Currently in Pain? No/denies                Social History   Tobacco Use  Smoking Status Never  Smokeless Tobacco Never    Goals Met:  Independence with exercise equipment Exercise tolerated well No report of concerns or symptoms today Strength training completed today  Goals Unmet:  Not Applicable  Comments: Pt able to follow exercise prescription today without complaint.  Will continue to monitor for progression.    Dr. Amor Filbert is Medical Director for [place].  Dr. Ottie Glazier is Medical Director for [facility] Pulmonary Rehabilitation.

## 2021-05-18 ENCOUNTER — Other Ambulatory Visit: Payer: Self-pay

## 2021-05-18 DIAGNOSIS — C50511 Malignant neoplasm of lower-outer quadrant of right female breast: Secondary | ICD-10-CM

## 2021-05-18 NOTE — Progress Notes (Signed)
CARE Daily Session Note  Patient Details  Name: Heather Bell MRN: [id number] Date of Birth: 1975-11-20 Referring Provider:   April Manson Cancer Associated Rehabilitation & Exercise from 02/14/2021 in [facility] Cardiac and Pulmonary Rehab  Referring Provider [name]       Encounter Date: 05/18/2021  Check In:  Session Check In - 05/18/21 1255       Check-In   Supervising physician immediately available to respond to emergencies See telemetry face sheet for immediately available ER MD    Location [facility]    Staff Present Coralie Keens, MS, ASCM CEP, Exercise Physiologist;Amanda Oletta Darter, BA, ACSM CEP, Exercise Physiologist    Virtual Visit No    Medication changes reported     No    Fall or balance concerns reported    No    Warm-up and Cool-down Performed on first and last piece of equipment    Resistance Training Performed No   yoga   VAD Patient? No    PAD/SET Patient? No                Social History   Tobacco Use  Smoking Status Never  Smokeless Tobacco Never    Goals Met:  Proper associated with RPD/PD & O2 Sat Exercise tolerated well Queuing for purse lip breathing No report of concerns or symptoms today  Goals Unmet:  Not Applicable  Comments: Pt able to follow exercise prescription today without complaint.  Will continue to monitor for progression.   Yoga completed today by Nada Maclachlan, Exercise Physiologist.   Dr. Maryiah Filbert is Medical Director for [place].  Dr. Ottie Glazier is Medical Director for [facility] Pulmonary Rehabilitation.

## 2021-05-25 ENCOUNTER — Other Ambulatory Visit: Payer: Self-pay

## 2021-05-25 ENCOUNTER — Ambulatory Visit: Payer: BC Managed Care – PPO | Admitting: Plastic Surgery

## 2021-05-25 ENCOUNTER — Encounter: Payer: BC Managed Care – PPO | Attending: Oncology

## 2021-05-25 DIAGNOSIS — Z171 Estrogen receptor negative status [ER-]: Secondary | ICD-10-CM | POA: Insufficient documentation

## 2021-05-25 DIAGNOSIS — C50511 Malignant neoplasm of lower-outer quadrant of right female breast: Secondary | ICD-10-CM | POA: Insufficient documentation

## 2021-05-25 NOTE — Progress Notes (Signed)
Daily Session Note  Patient Details  Name: Heather Bell MRN: [id number] Date of Birth: 06-Nov-1975 Referring Provider:   April Manson Cancer Associated Rehabilitation & Exercise from 02/14/2021 in [facility] Cardiac and Pulmonary Rehab  Referring Provider [name]       Encounter Date: 05/25/2021  Check In:  Session Check In - 05/25/21 1223       Check-In   Supervising physician immediately available to respond to emergencies See telemetry face sheet for immediately available ER MD    Location [facility]    Staff Present Nada Maclachlan, BA, ACSM CEP, Exercise Physiologist;Kara Eliezer Bottom, MS, ASCM CEP, Exercise Physiologist    Virtual Visit No    Medication changes reported     No    Fall or balance concerns reported    No    Warm-up and Cool-down Performed on first and last piece of equipment    Resistance Training Performed Yes    VAD Patient? No    PAD/SET Patient? No      Pain Assessment   Currently in Pain? No/denies                Social History   Tobacco Use  Smoking Status Never  Smokeless Tobacco Never    Goals Met:  Independence with exercise equipment Exercise tolerated well No report of concerns or symptoms today Strength training completed today  Goals Unmet:  Not Applicable  Comments: Pt able to follow exercise prescription today without complaint.  Will continue to monitor for progression.    Dr. Adaley Filbert is Medical Director for [place].  Dr. Ottie Glazier is Medical Director for [facility] Pulmonary Rehabilitation.

## 2021-05-30 ENCOUNTER — Other Ambulatory Visit: Payer: Self-pay

## 2021-05-30 DIAGNOSIS — C50511 Malignant neoplasm of lower-outer quadrant of right female breast: Secondary | ICD-10-CM

## 2021-05-30 NOTE — Progress Notes (Signed)
Daily Session Note  Patient Details  Name: Heather Bell MRN: [id number] Date of Birth: 1976-04-20 Referring Provider:   April Manson Cancer Associated Rehabilitation & Exercise from 02/14/2021 in [facility] Cardiac and Pulmonary Rehab  Referring Provider [name]       Encounter Date: 05/30/2021  Check In:  Session Check In - 05/30/21 1244       Check-In   Supervising physician immediately available to respond to emergencies See telemetry face sheet for immediately available ER MD    Location [facility] Re[facility]    Staff Present Birdie Sons, MPA, Nino Glow, MS, ASCM CEP, Exercise Physiologist;Jessica Luan Pulling, MA, RCEP, CCRP, CCET    Virtual Visit No    Medication changes reported     No    Fall or balance concerns reported    No    Warm-up and Cool-down Performed on first and last piece of equipment    Resistance Training Performed Yes    VAD Patient? No    PAD/SET Patient? No      Pain Assessment   Currently in Pain? No/denies                Social History   Tobacco Use  Smoking Status Never  Smokeless Tobacco Never    Goals Met:  Independence with exercise equipment Exercise tolerated well No report of concerns or symptoms today Strength training completed today  Goals Unmet:  Not Applicable  Comments: Pt able to follow exercise prescription today without complaint.  Will continue to monitor for progression.    Dr. Yaelis Filbert is Medical Director for [place].  Dr. Ottie Glazier is Medical Director for [facility] Pulmonary Rehabilitation.

## 2021-06-01 ENCOUNTER — Other Ambulatory Visit: Payer: Self-pay

## 2021-06-01 DIAGNOSIS — C50511 Malignant neoplasm of lower-outer quadrant of right female breast: Secondary | ICD-10-CM

## 2021-06-01 NOTE — Progress Notes (Signed)
Daily Session Note  Patient Details  Name: Heather Bell MRN: [id number] Date of Birth: [birth year] Referring Provider:   April Manson Cancer Associated Rehabilitation & Exercise from 02/14/2021 in [facility] Cardiac and Pulmonary Rehab  Referring Provider [name]       Encounter Date: 06/01/2021  Check In:  Session Check In - 06/01/21 1231       Check-In   Supervising physician immediately available to respond to emergencies See telemetry face sheet for immediately available ER MD    Location [facility] Re[facility]    Staff Present Birdie Sons, MPA, Nino Glow, MS, ASCM CEP, Exercise Physiologist;Amanda Oletta Darter, BA, ACSM CEP, Exercise Physiologist    Virtual Visit No    Medication changes reported     No    Fall or balance concerns reported    No    Warm-up and Cool-down Performed on first and last piece of equipment    Resistance Training Performed Yes    VAD Patient? No    PAD/SET Patient? No      Pain Assessment   Currently in Pain? No/denies                Social History   Tobacco Use  Smoking Status Never  Smokeless Tobacco Never    Goals Met:  Independence with exercise equipment Exercise tolerated well No report of concerns or symptoms today Strength training completed today  Goals Unmet:  Not Applicable  Comments: Pt able to follow exercise prescription today without complaint.  Will continue to monitor for progression.    Dr. Lailana Filbert is Medical Director for [place].  Dr. Ottie Glazier is Medical Director for [facility] Pulmonary Rehabilitation.

## 2021-06-06 ENCOUNTER — Other Ambulatory Visit: Payer: Self-pay

## 2021-06-06 DIAGNOSIS — C50511 Malignant neoplasm of lower-outer quadrant of right female breast: Secondary | ICD-10-CM

## 2021-06-06 NOTE — Progress Notes (Signed)
Daily Session Note  Patient Details  Name: GLENNDA WEATHERHOLTZ MRN: [id number] Date of Birth: [birth year] Referring Provider:   April Manson Cancer Associated Rehabilitation & Exercise from 02/14/2021 in [facility] Cardiac and Pulmonary Rehab  Referring Provider [name]       Encounter Date: 06/06/2021  Check In:  Session Check In - 06/06/21 1244       Check-In   Supervising physician immediately available to respond to emergencies See telemetry face sheet for immediately available ER MD    Location [facility] Pulmonary Re[facility]    Staff Present Birdie Sons, MPA, RN;Jessica [place], MA, RCEP, CCRP, Marylynn Pearson, MS, ASCM CEP, Exercise Physiologist    Virtual Visit No    Medication changes reported     No    Fall or balance concerns reported    No    Warm-up and Cool-down Performed on first and last piece of equipment    Resistance Training Performed Yes    VAD Patient? No    PAD/SET Patient? No      Pain Assessment   Currently in Pain? No/denies                Social History   Tobacco Use  Smoking Status Never  Smokeless Tobacco Never    Goals Met:  Independence with exercise equipment Exercise tolerated well No report of concerns or symptoms today Strength training completed today  Goals Unmet:  Not Applicable  Comments: Pt able to follow exercise prescription today without complaint.  Will continue to monitor for progression.    Dr. Prudy Filbert is Medical Director for [place].  Dr. Ottie Glazier is Medical Director for [facility] Pulmonary Rehabilitation.

## 2021-06-08 ENCOUNTER — Other Ambulatory Visit: Payer: Self-pay

## 2021-06-08 DIAGNOSIS — C50511 Malignant neoplasm of lower-outer quadrant of right female breast: Secondary | ICD-10-CM

## 2021-06-08 DIAGNOSIS — Z171 Estrogen receptor negative status [ER-]: Secondary | ICD-10-CM

## 2021-06-08 NOTE — Progress Notes (Signed)
Daily Session Note  Patient Details  Name: Heather Bell MRN: [id number] Date of Birth: 07/24/1976 Referring Provider:   April Manson Cancer Associated Rehabilitation & Exercise from 02/14/2021 in [facility] Cardiac and Pulmonary Rehab  Referring Provider [name]       Encounter Date: 06/08/2021  Check In:  Session Check In - 06/08/21 1246       Check-In   Supervising physician immediately available to respond to emergencies See telemetry face sheet for immediately available ER MD    Location [facility] Pul[facility] Re[facility]    Staff Present Coralie Keens, MS, ASCM CEP, Exercise Physiologist;Amanda Oletta Darter, BA, ACSM CEP, Exercise Physiologist    Virtual Visit No    Medication changes reported     No    Fall or balance concerns reported    No    Warm-up and Cool-down Performed on first and last piece of equipment    Resistance Training Performed No    VAD Patient? No    PAD/SET Patient? No                Social History   Tobacco Use  Smoking Status Never  Smokeless Tobacco Never    Goals Met:  Proper associated with RPD/PD & O2 Sat Exercise tolerated well No report of concerns or symptoms today Strength training completed today  Goals Unmet:  Not Applicable  Comments: Pt able to follow exercise prescription today without complaint.  Will continue to monitor for progression.   Yoga completed today by Exercise Physiologist, Nada Maclachlan.   Dr. Ladeja Filbert is Medical Director for [place].  Dr. Ottie Glazier is Medical Director for [facility] Pulmonary Rehabilitation.

## 2021-06-12 NOTE — H&P (View-Only) (Signed)
Patient ID: Heather Bell, female    DOB: [birth year], 45 y.o.   MRN: [id number]  Chief Complaint  Patient presents with   Pre-op Exam      ICD-10-CM   1. Breast cancer metastasized to axillary lymph node, right (HCC)  C50.911    C77.3        History of Present Illness: Heather Bell is a 45 y.o.  female  with a history of right-sided breast cancer.  She presents for preoperative evaluation for upcoming procedure, right-sided implant exchange with capsulotomy and left-sided mastopexy, scheduled for 06/27/2021 with Dr. Claudia Desanctis.  The patient has not had problems with anesthesia.  She denies any personal or family history of blood clots or clotting disorder.  She is diabetic, but states well controlled on metformin.  I reviewed her chart and most recent A1c obtained 03/06/2021 was 5.8.  She states that she did not have to hold her Adderall during prior surgery, but will plan to hold 1 week leading up to surgery given potential interaction with anesthesia.  Summary of Previous Visit: Patient had right breast expander placement 12/27/2020, currently 400/300 cc.  She had delayed expansion due to skin tightness, but is now proceeding with second phase of her right breast reconstruction.  Plan is for capsulotomies on right side prior to implant exchange.  She will also have left-sided breast reduction to help with symmetry.  PMH Significant for: Right-sided breast cancer, DM, mood disorder, cardiomyopathy.  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   Past Medical History: Allergies: Allergies  Allergen Reactions   Compazine [Prochlorperazine Edisylate] Other (See Comments)    Acute dystonic reaction   Morphine And Related Shortness Of Breath   Famotidine Nausea Only   Doxycycline Other (See Comments)    "made all my symptoms worse"    Fluticasone Other (See Comments)     Doesn't work   Levofloxacin Other (See Comments)    "made all my symptoms worse"     Current Medications:  Current Outpatient Medications:    Accu-Chek FastClix Lancets MISC, See admin instructions., Disp: , Rfl:    ACCU-CHEK GUIDE test strip, SMARTSIG:1 Strip(s) Via Meter Daily PRN, Disp: , Rfl:    amphetamine-dextroamphetamine (ADDERALL XR) 25 MG 24 hr capsule, Take 25 mg by mouth daily., Disp: , Rfl:    Blood Glucose Monitoring Suppl (ACCU-CHEK GUIDE ME) w/Device KIT, USE 1 DEVICE ONCE DAILY AS DIRECTED, Disp: , Rfl:    buPROPion (WELLBUTRIN XL) 150 MG 24 hr tablet, Take 150 mg by mouth every morning., Disp: , Rfl:    Calcium Carb-Cholecalciferol (CALCIUM 600+D) 600-800 MG-UNIT TABS, Take 1 tablet by mouth daily., Disp: , Rfl:    cetirizine (ZYRTEC) 10 MG tablet, Take 10 mg by mouth daily. , Disp: , Rfl:    diazepam (VALIUM) 10 MG tablet, Take 10 mg by mouth. As needed prior to procedures, Disp: , Rfl:    doxepin (SINEQUAN) 25 MG capsule, Take 25-50 mg by mouth at bedtime. , Disp: , Rfl:    HYDROcodone-acetaminophen (NORCO) 5-325 MG tablet, Take 1 tablet by mouth every 6 (six) hours as needed for up to 5 days for severe pain., Disp: 20 tablet, Rfl: 0   ibuprofen (ADVIL) 200 MG tablet, Take 400-800 mg by mouth every 6 (six) hours as needed for headache or moderate pain., Disp: , Rfl:    lamoTRIgine (LAMICTAL) 150 MG tablet,  Take 150 mg by mouth daily., Disp: , Rfl:    lurasidone (LATUDA) 40 MG TABS tablet, Take 40 mg by mouth at bedtime. , Disp: , Rfl:    Lurasidone HCl 60 MG TABS, Take 60 mg by mouth at bedtime. , Disp: , Rfl:    metFORMIN (GLUCOPHAGE-XR) 500 MG 24 hr tablet, Take 500 mg by mouth 2 (two) times daily., Disp: , Rfl:    mometasone (NASONEX) 50 MCG/ACT nasal spray, Place 2 sprays into the nose daily as needed (allergies). , Disp: , Rfl:    Multiple Vitamin (MULTIVITAMIN WITH MINERALS) TABS tablet, Take 1 tablet by mouth daily., Disp: , Rfl:    omeprazole (PRILOSEC) 20 MG  capsule, Take 20 mg by mouth daily before breakfast. , Disp: , Rfl:    ondansetron (ZOFRAN ODT) 4 MG disintegrating tablet, Take 1 tablet (4 mg total) by mouth every 8 (eight) hours as needed for nausea or vomiting., Disp: 20 tablet, Rfl: 0   QUEtiapine (SEROQUEL) 300 MG tablet, Take 300 mg by mouth at bedtime., Disp: , Rfl:    simvastatin (ZOCOR) 20 MG tablet, Take 20 mg by mouth daily., Disp: , Rfl:    sulfamethoxazole-trimethoprim (BACTRIM DS) 800-160 MG tablet, Take 1 tablet by mouth 2 (two) times daily for 14 days., Disp: 28 tablet, Rfl: 0   topiramate (TOPAMAX) 100 MG tablet, Take 50-100 mg by mouth See admin instructions. Take 1 tablet (100 mg) by mouth in the morning & take 0.5 tablet (50 mg) by mouth at night., Disp: , Rfl:    vitamin B-12 (CYANOCOBALAMIN) 1000 MCG tablet, Take 1,000 mcg by mouth daily., Disp: , Rfl:  No current facility-administered medications for this visit.  Facility-Administered Medications Ordered in Other Visits:    0.9 %  sodium chloride infusion, , Intravenous, Continuous, Corcoran, Melissa C, MD, Stopped at 01/08/20 1516   sodium chloride flush (NS) 0.9 % injection 10 mL, 10 mL, Intravenous, PRN, Mike Gip, Melissa C, MD, 10 mL at 01/08/20 1314  Past Medical Problems: Past Medical History:  Diagnosis Date   Allergic rhinitis    Anxiety    Bipolar disorder ([place])    depression   Breast cancer ([place]) 03/21/2020   Right mastectomy   Cancer ([place])    breast right   Depression    Diabetes ([place])    GERD (gastroesophageal reflux disease)    Hyperlipidemia    Personal history of chemotherapy    Jan 2021 through July 2021    Past Surgical History: Past Surgical History:  Procedure Laterality Date   BREAST BIOPSY Right 07/22/2019   mass bx, path pending, heart marker   BREAST BIOPSY Right 07/22/2019   LN bx, path pending,  butterfly hydromarker   BREAST RECONSTRUCTION WITH PLACEMENT OF TISSUE EXPANDER AND FLEX HD (ACELLULAR HYDRATED DERMIS) Right 03/21/2020    Procedure: RIGHT BREAST RECONSTRUCTION WITH PLACEMENT OF TISSUE EXPANDER AND FLEX HD (ACELLULAR HYDRATED DERMIS);  Surgeon: [name], MD;  Location: [facility];  Service: Plastics;  Laterality: Right;   BREAST RECONSTRUCTION WITH PLACEMENT OF TISSUE EXPANDER AND FLEX HD (ACELLULAR HYDRATED DERMIS) Right 12/27/2020   Procedure: BREAST RECONSTRUCTION WITH PLACEMENT OF TISSUE EXPANDER AND FLEX HD (ACELLULAR HYDRATED DERMIS);  Surgeon: [name], MD;  Location: [place];  Service: Plastics;  Laterality: Right;   IMAGE GUIDED SINUS SURGERY     INCISION AND DRAINAGE OF WOUND Right 05/02/2020   Procedure: IRRIGATION AND DEBRIDEMENT WOUND WITH REMOVAL OF RIGHT TISSUE EXPANDER;  Surgeon: Mingo Amber  S, MD;  Location: [place];  Service: Plastics;  Laterality: Right;  1 hour please   KNEE SURGERY     MASTECTOMY Right 03/21/2020   NASAL SEPTUM SURGERY     PORTACATH PLACEMENT N/A 08/07/2019   Procedure: INSERTION PORT-A-CATH;  Surgeon: Herbert Pun, MD;  Location: [facility];  Service: General;  Laterality: N/A;   TONSILLECTOMY     TOTAL MASTECTOMY Right 03/21/2020   Procedure: TOTAL MASTECTOMY;  Surgeon: Herbert Pun, MD;  Location: [facility];  Service: General;  Laterality: Right;    Social History: Social History   Socioeconomic History   Marital status: Married    Spouse name: Not on file   Number of children: Not on file   Years of education: Not on file   Highest education level: Not on file  Occupational History   Not on file  Tobacco Use   Smoking status: Never   Smokeless tobacco: Never  Vaping Use   Vaping Use: Never used  Substance and Sexual Activity   Alcohol use: No    Alcohol/week: 0.0 standard drinks   Drug use: No   Sexual activity: Not on file  Other Topics Concern   Not on file  Social History Narrative   Not on file   Social Determinants of Health   Financial Resource Strain: Not on file  Food  Insecurity: Not on file  Transportation Needs: Not on file  Physical Activity: Not on file  Stress: Not on file  Social Connections: Not on file  Intimate Partner Violence: Not on file    Family History: Family History  Problem Relation Age of Onset   Lung cancer Father    Heart attack Father    Lung cancer Paternal Aunt    Lung cancer Paternal Uncle    Heart disease Paternal Uncle    Breast cancer Neg Hx     Review of Systems: ROS No recent illness or infection.  Physical Exam: Vital Signs BP 108/74 (BP Location: Left Arm, Patient Position: Sitting, Cuff Size: Normal)   Pulse (!) 101   Ht '5\' 6"'  (1.676 m)   Wt 157 lb 9.6 oz (71.5 kg)   SpO2 98%   BMI 25.44 kg/m   Physical Exam Constitutional:      General: Not in acute distress.    Appearance: Normal appearance. Not ill-appearing.  HENT:     Head: Normocephalic and atraumatic.  Eyes:     Pupils: Pupils are equal, round Neck:     Musculoskeletal: Normal range of motion.  Cardiovascular:     Rate and Rhythm: Normal rate    Pulses: Normal pulses.  Pulmonary:     Effort: Pulmonary effort is normal. No respiratory distress.  Abdominal:     General: Abdomen is flat. There is no distension.  Musculoskeletal: Normal range of motion.  No lower extremity swelling or edema.  No varicosities noted.  Peripheral pulses intact. Skin:    General: Skin is warm and dry.     Findings: No erythema or rash.  Neurological:     General: No focal deficit present.     Mental Status: Alert and oriented to person, place, and time. Mental status is at baseline.     Motor: No weakness.  Psychiatric:        Mood and Affect: Mood normal.        Behavior: Behavior normal.    Assessment/Plan: The patient is scheduled for right-sided capsulotomy with implant exchange and left-sided mastopexy with Dr.  Pace.  Risks, benefits, and alternatives of procedure discussed, questions answered and consent obtained.    Smoking Status:  Non-smoker. Last Mammogram: 07/2020.  Results: BI-RADS Category 1, negative.  Caprini Score: 6, high; Risk Factors include: Age, BMI greater than 25, history of malignancy, and length of planned surgery. Recommendation for mechanical prophylaxis. Encourage early ambulation.   Pictures obtained: Today.  Post-op Rx sent to pharmacy: Norco, Zofran, Bactrim.  Patient was provided with the General Surgical Risk consent document and Pain Medication Agreement prior to their appointment.  They had adequate time to read through the risk consent documents and Pain Medication Agreement. We also discussed them in person together during this preop appointment. All of their questions were answered to their satisfaction.  Recommended calling if they have any further questions.  Risk consent form and Pain Medication Agreement to be scanned into patient's chart.  The risks that can be encountered with and after placement of a breast implant were discussed and include the following but not limited to these: bleeding, infection, delayed healing, anesthesia risks, skin sensation changes, injury to structures including nerves, blood vessels, and muscles which may be temporary or permanent, allergies to tape, suture materials and glues, blood products, topical preparations or injected agents, skin contour irregularities, skin discoloration and swelling, deep vein thrombosis, cardiac and pulmonary complications, pain, which may persist, fluid accumulation, wrinkling of the skin over the implanmt, changes in nipple or breast sensation, implant leakage or rupture, faulty position of the implant, persistent pain, formation of tight scar tissue around the implant (capsular contracture).   Patient was provided with the [employer] implant patient decision checklist and this was completed during today's preoperative evaluation. Patient had time to read through the information and any questions were answered to their content. Form will  be scanned into patient's chart.   Electronically signed by: Krista Blue, PA-C 06/13/2021 1:26 PM

## 2021-06-12 NOTE — Progress Notes (Addendum)
Patient ID: Heather Bell, female    DOB: Mar 29, 1976, 45 y.o.   MRN: [id number]  Chief Complaint  Patient presents with   Pre-op Exam      ICD-10-CM   1. Breast cancer metastasized to axillary lymph node, right (HCC)  C50.911    C77.3        History of Present Illness: Heather Bell is a 45 y.o.  female  with a history of right-sided breast cancer.  She presents for preoperative evaluation for upcoming procedure, right-sided implant exchange with capsulotomy and left-sided mastopexy, scheduled for 06/27/2021 with Dr. Claudia Desanctis.  The patient has not had problems with anesthesia.  She denies any personal or family history of blood clots or clotting disorder.  She is diabetic, but states well controlled on metformin.  I reviewed her chart and most recent A1c obtained 03/06/2021 was 5.8.  She states that she did not have to hold her Adderall during prior surgery, but will plan to hold 1 week leading up to surgery given potential interaction with anesthesia.  Summary of Previous Visit: Patient had right breast expander placement 12/27/2020, currently 400/300 cc.  She had delayed expansion due to skin tightness, but is now proceeding with second phase of her right breast reconstruction.  Plan is for capsulotomies on right side prior to implant exchange.  She will also have left-sided breast reduction to help with symmetry.  PMH Significant for: Right-sided breast cancer, DM, mood disorder, cardiomyopathy.  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   Past Medical History: Allergies: Allergies  Allergen Reactions   Compazine [Prochlorperazine Edisylate] Other (See Comments)    Acute dystonic reaction   Morphine And Related Shortness Of Breath   Famotidine Nausea Only   Doxycycline Other (See Comments)    "made all my symptoms worse"    Fluticasone Other (See Comments)     Doesn't work   Levofloxacin Other (See Comments)    "made all my symptoms worse"     Current Medications:  Current Outpatient Medications:    Accu-Chek FastClix Lancets MISC, See admin instructions., Disp: , Rfl:    ACCU-CHEK GUIDE test strip, SMARTSIG:1 Strip(s) Via Meter Daily PRN, Disp: , Rfl:    amphetamine-dextroamphetamine (ADDERALL XR) 25 MG 24 hr capsule, Take 25 mg by mouth daily., Disp: , Rfl:    Blood Glucose Monitoring Suppl (ACCU-CHEK GUIDE ME) w/Device KIT, USE 1 DEVICE ONCE DAILY AS DIRECTED, Disp: , Rfl:    buPROPion (WELLBUTRIN XL) 150 MG 24 hr tablet, Take 150 mg by mouth every morning., Disp: , Rfl:    Calcium Carb-Cholecalciferol (CALCIUM 600+D) 600-800 MG-UNIT TABS, Take 1 tablet by mouth daily., Disp: , Rfl:    cetirizine (ZYRTEC) 10 MG tablet, Take 10 mg by mouth daily. , Disp: , Rfl:    diazepam (VALIUM) 10 MG tablet, Take 10 mg by mouth. As needed prior to procedures, Disp: , Rfl:    doxepin (SINEQUAN) 25 MG capsule, Take 25-50 mg by mouth at bedtime. , Disp: , Rfl:    HYDROcodone-acetaminophen (NORCO) 5-325 MG tablet, Take 1 tablet by mouth every 6 (six) hours as needed for up to 5 days for severe pain., Disp: 20 tablet, Rfl: 0   ibuprofen (ADVIL) 200 MG tablet, Take 400-800 mg by mouth every 6 (six) hours as needed for headache or moderate pain., Disp: , Rfl:    lamoTRIgine (LAMICTAL) 150 MG tablet,  Take 150 mg by mouth daily., Disp: , Rfl:    lurasidone (LATUDA) 40 MG TABS tablet, Take 40 mg by mouth at bedtime. , Disp: , Rfl:    Lurasidone HCl 60 MG TABS, Take 60 mg by mouth at bedtime. , Disp: , Rfl:    metFORMIN (GLUCOPHAGE-XR) 500 MG 24 hr tablet, Take 500 mg by mouth 2 (two) times daily., Disp: , Rfl:    mometasone (NASONEX) 50 MCG/ACT nasal spray, Place 2 sprays into the nose daily as needed (allergies). , Disp: , Rfl:    Multiple Vitamin (MULTIVITAMIN WITH MINERALS) TABS tablet, Take 1 tablet by mouth daily., Disp: , Rfl:    omeprazole (PRILOSEC) 20 MG  capsule, Take 20 mg by mouth daily before breakfast. , Disp: , Rfl:    ondansetron (ZOFRAN ODT) 4 MG disintegrating tablet, Take 1 tablet (4 mg total) by mouth every 8 (eight) hours as needed for nausea or vomiting., Disp: 20 tablet, Rfl: 0   QUEtiapine (SEROQUEL) 300 MG tablet, Take 300 mg by mouth at bedtime., Disp: , Rfl:    simvastatin (ZOCOR) 20 MG tablet, Take 20 mg by mouth daily., Disp: , Rfl:    sulfamethoxazole-trimethoprim (BACTRIM DS) 800-160 MG tablet, Take 1 tablet by mouth 2 (two) times daily for 14 days., Disp: 28 tablet, Rfl: 0   topiramate (TOPAMAX) 100 MG tablet, Take 50-100 mg by mouth See admin instructions. Take 1 tablet (100 mg) by mouth in the morning & take 0.5 tablet (50 mg) by mouth at night., Disp: , Rfl:    vitamin B-12 (CYANOCOBALAMIN) 1000 MCG tablet, Take 1,000 mcg by mouth daily., Disp: , Rfl:  No current facility-administered medications for this visit.  Facility-Administered Medications Ordered in Other Visits:    0.9 %  sodium chloride infusion, , Intravenous, Continuous, Corcoran, Melissa C, MD, Stopped at 01/08/20 1516   sodium chloride flush (NS) 0.9 % injection 10 mL, 10 mL, Intravenous, PRN, Mike Gip, Melissa C, MD, 10 mL at 01/08/20 1314  Past Medical Problems: Past Medical History:  Diagnosis Date   Allergic rhinitis    Anxiety    Bipolar disorder ([place])    depression   Breast cancer ([place]) 03/21/2020   Right mastectomy   Cancer ([place])    breast right   Depression    Diabetes ([place])    GERD (gastroesophageal reflux disease)    Hyperlipidemia    Personal history of chemotherapy    Jan 2021 through July 2021    Past Surgical History: Past Surgical History:  Procedure Laterality Date   BREAST BIOPSY Right 07/22/2019   mass bx, path pending, heart marker   BREAST BIOPSY Right 07/22/2019   LN bx, path pending,  butterfly hydromarker   BREAST RECONSTRUCTION WITH PLACEMENT OF TISSUE EXPANDER AND FLEX HD (ACELLULAR HYDRATED DERMIS) Right 03/21/2020    Procedure: RIGHT BREAST RECONSTRUCTION WITH PLACEMENT OF TISSUE EXPANDER AND FLEX HD (ACELLULAR HYDRATED DERMIS);  Surgeon: [name], MD;  Location: [facility];  Service: Plastics;  Laterality: Right;   BREAST RECONSTRUCTION WITH PLACEMENT OF TISSUE EXPANDER AND FLEX HD (ACELLULAR HYDRATED DERMIS) Right 12/27/2020   Procedure: BREAST RECONSTRUCTION WITH PLACEMENT OF TISSUE EXPANDER AND FLEX HD (ACELLULAR HYDRATED DERMIS);  Surgeon: [name], MD;  Location: [place];  Service: Plastics;  Laterality: Right;   IMAGE GUIDED SINUS SURGERY     INCISION AND DRAINAGE OF WOUND Right 05/02/2020   Procedure: IRRIGATION AND DEBRIDEMENT WOUND WITH REMOVAL OF RIGHT TISSUE EXPANDER;  Surgeon: Mingo Amber  S, MD;  Location: [place];  Service: Plastics;  Laterality: Right;  1 hour please   KNEE SURGERY     MASTECTOMY Right 03/21/2020   NASAL SEPTUM SURGERY     PORTACATH PLACEMENT N/A 08/07/2019   Procedure: INSERTION PORT-A-CATH;  Surgeon: Herbert Pun, MD;  Location: [facility];  Service: General;  Laterality: N/A;   TONSILLECTOMY     TOTAL MASTECTOMY Right 03/21/2020   Procedure: TOTAL MASTECTOMY;  Surgeon: Herbert Pun, MD;  Location: [facility];  Service: General;  Laterality: Right;    Social History: Social History   Socioeconomic History   Marital status: Married    Spouse name: Not on file   Number of children: Not on file   Years of education: Not on file   Highest education level: Not on file  Occupational History   Not on file  Tobacco Use   Smoking status: Never   Smokeless tobacco: Never  Vaping Use   Vaping Use: Never used  Substance and Sexual Activity   Alcohol use: No    Alcohol/week: 0.0 standard drinks   Drug use: No   Sexual activity: Not on file  Other Topics Concern   Not on file  Social History Narrative   Not on file   Social Determinants of Health   Financial Resource Strain: Not on file  Food  Insecurity: Not on file  Transportation Needs: Not on file  Physical Activity: Not on file  Stress: Not on file  Social Connections: Not on file  Intimate Partner Violence: Not on file    Family History: Family History  Problem Relation Age of Onset   Lung cancer Father    Heart attack Father    Lung cancer Paternal Aunt    Lung cancer Paternal Uncle    Heart disease Paternal Uncle    Breast cancer Neg Hx     Review of Systems: ROS No recent illness or infection.  Physical Exam: Vital Signs BP 108/74 (BP Location: Left Arm, Patient Position: Sitting, Cuff Size: Normal)   Pulse (!) 101   Ht '5\' 6"'  (1.676 m)   Wt 157 lb 9.6 oz (71.5 kg)   SpO2 98%   BMI 25.44 kg/m   Physical Exam Constitutional:      General: Not in acute distress.    Appearance: Normal appearance. Not ill-appearing.  HENT:     Head: Normocephalic and atraumatic.  Eyes:     Pupils: Pupils are equal, round Neck:     Musculoskeletal: Normal range of motion.  Cardiovascular:     Rate and Rhythm: Normal rate    Pulses: Normal pulses.  Pulmonary:     Effort: Pulmonary effort is normal. No respiratory distress.  Abdominal:     General: Abdomen is flat. There is no distension.  Musculoskeletal: Normal range of motion.  No lower extremity swelling or edema.  No varicosities noted.  Peripheral pulses intact. Skin:    General: Skin is warm and dry.     Findings: No erythema or rash.  Neurological:     General: No focal deficit present.     Mental Status: Alert and oriented to person, place, and time. Mental status is at baseline.     Motor: No weakness.  Psychiatric:        Mood and Affect: Mood normal.        Behavior: Behavior normal.    Assessment/Plan: The patient is scheduled for right-sided capsulotomy with implant exchange and left-sided mastopexy with Dr.  Pace.  Risks, benefits, and alternatives of procedure discussed, questions answered and consent obtained.    Smoking Status:  Non-smoker. Last Mammogram: 07/2020.  Results: BI-RADS Category 1, negative.  Caprini Score: 6, high; Risk Factors include: Age, BMI greater than 25, history of malignancy, and length of planned surgery. Recommendation for mechanical prophylaxis. Encourage early ambulation.   Pictures obtained: Today.  Post-op Rx sent to pharmacy: Norco, Zofran, Bactrim.  Patient was provided with the General Surgical Risk consent document and Pain Medication Agreement prior to their appointment.  They had adequate time to read through the risk consent documents and Pain Medication Agreement. We also discussed them in person together during this preop appointment. All of their questions were answered to their satisfaction.  Recommended calling if they have any further questions.  Risk consent form and Pain Medication Agreement to be scanned into patient's chart.  The risks that can be encountered with and after placement of a breast implant were discussed and include the following but not limited to these: bleeding, infection, delayed healing, anesthesia risks, skin sensation changes, injury to structures including nerves, blood vessels, and muscles which may be temporary or permanent, allergies to tape, suture materials and glues, blood products, topical preparations or injected agents, skin contour irregularities, skin discoloration and swelling, deep vein thrombosis, cardiac and pulmonary complications, pain, which may persist, fluid accumulation, wrinkling of the skin over the implanmt, changes in nipple or breast sensation, implant leakage or rupture, faulty position of the implant, persistent pain, formation of tight scar tissue around the implant (capsular contracture).   Patient was provided with the [employer] implant patient decision checklist and this was completed during today's preoperative evaluation. Patient had time to read through the information and any questions were answered to their content. Form will  be scanned into patient's chart.   Electronically signed by: Krista Blue, PA-C 06/13/2021 1:26 PM

## 2021-06-13 ENCOUNTER — Ambulatory Visit (INDEPENDENT_AMBULATORY_CARE_PROVIDER_SITE_OTHER): Payer: BC Managed Care – PPO | Admitting: Physician Assistant

## 2021-06-13 ENCOUNTER — Other Ambulatory Visit: Payer: Self-pay

## 2021-06-13 ENCOUNTER — Encounter: Payer: Self-pay | Admitting: Physician Assistant

## 2021-06-13 VITALS — BP 108/74 | HR 101 | Ht 66.0 in | Wt 157.6 lb

## 2021-06-13 DIAGNOSIS — C773 Secondary and unspecified malignant neoplasm of axilla and upper limb lymph nodes: Secondary | ICD-10-CM

## 2021-06-13 DIAGNOSIS — C50511 Malignant neoplasm of lower-outer quadrant of right female breast: Secondary | ICD-10-CM

## 2021-06-13 DIAGNOSIS — C50911 Malignant neoplasm of unspecified site of right female breast: Secondary | ICD-10-CM

## 2021-06-13 DIAGNOSIS — Z171 Estrogen receptor negative status [ER-]: Secondary | ICD-10-CM

## 2021-06-13 MED ORDER — HYDROCODONE-ACETAMINOPHEN 5-325 MG PO TABS: 1.0000 | ORAL_TABLET | Freq: Four times a day (QID) | ORAL | 0 refills | Status: AC | PRN

## 2021-06-13 MED ORDER — SULFAMETHOXAZOLE-TRIMETHOPRIM 800-160 MG PO TABS: 1.0000 | ORAL_TABLET | Freq: Two times a day (BID) | ORAL | 0 refills | Status: AC

## 2021-06-13 MED ORDER — ONDANSETRON 4 MG PO TBDP: 4.0000 mg | ORAL_TABLET | Freq: Three times a day (TID) | ORAL | 0 refills | Status: DC | PRN

## 2021-06-13 NOTE — Progress Notes (Signed)
Daily Session Note  Patient Details  Name: Heather Bell MRN: [id number] Date of Birth: Dec 12, 1975 Referring Provider:   April Manson Cancer Associated Rehabilitation & Exercise from 02/14/2021 in [facility] Cardiac and Pulmonary Rehab  Referring Provider [name]       Encounter Date: 06/13/2021  Check In:  Session Check In - 06/13/21 1212       Check-In   Supervising physician immediately available to respond to emergencies See telemetry face sheet for immediately available ER MD    Location [facility]-Cardiac & Pulmonary Rehab    Staff Present Birdie Sons, MPA, Nino Glow, MS, ASCM CEP, Exercise Physiologist;Melissa Caiola, RDN, LDN    Virtual Visit No    Medication changes reported     No    Fall or balance concerns reported    No    Warm-up and Cool-down Performed on first and last piece of equipment    Resistance Training Performed Yes    VAD Patient? No    PAD/SET Patient? No      Pain Assessment   Currently in Pain? No/denies                Social History   Tobacco Use  Smoking Status Never  Smokeless Tobacco Never    Goals Met:  Independence with exercise equipment Exercise tolerated well No report of concerns or symptoms today Strength training completed today  Goals Unmet:  Not Applicable  Comments: Pt able to follow exercise prescription today without complaint.  Will continue to monitor for progression.    Dr. Delayni Filbert is Medical Director for [name].  Dr. Ottie Glazier is Medical Director for [facility] Pulmonary Rehabilitation.

## 2021-06-14 ENCOUNTER — Telehealth: Payer: Self-pay | Admitting: Internal Medicine

## 2021-06-14 NOTE — Telephone Encounter (Signed)
Reached out to patient but got VM. LMTCB.

## 2021-06-14 NOTE — Telephone Encounter (Signed)
   Name: Heather Bell  DOB: 16-Dec-1975  MRN: [id number]   Primary Cardiologist: Nelva Bush, MD  Chart reviewed as part of pre-operative protocol coverage. Fallen Crisostomo Mcartor was last seen on 01/2021 by Dr. Saunders Revel, chart reviewed. She has been following with him for question of cardiomyopathy. She had a prior echo 2020 showing low normal LVEF with global HK. Due to suboptimal images, he has been following her EF with serial MUGA scans.  Most recent study on 01/30/2021 showed low normal LVEF of 53% (down from 57% a year earlier). At last OV there was consideration of repeat MUGA testing in 07/2021 +/- ischemia testing if indicated. She is not on any blood thinners.  As she is pursuing surgery under general anesthesia, will route to Dr. Saunders Revel for his input on whether she should undergo any ischemic screening as she has no prior history of this. Once we have his reply, will plan to call patient to assess for any new cardiac symptoms - if there is development of any interim symptoms, she will require OV, otherwise will be hopeful to clear over the phone. Dr. Saunders Revel - Please route response to P CV DIV PREOP (the pre-op pool). Thank you.   Charlie Pitter, PA-C 06/14/2021, 11:03 AM

## 2021-06-14 NOTE — Telephone Encounter (Signed)
   [facility] Pre-operative Risk Assessment    Patient Name: Heather Bell  DOB: 06-27-1976 MRN: [id number]  HEARTCARE STAFF:  - IMPORTANT!!!!!! Under Visit Info/Reason for Call, type in Other and utilize the format Clearance MM/DD/YY or Clearance TBD. Do not use dashes or single digits. - Please review there is not already an duplicate clearance open for this procedure. - If request is for dental extraction, please clarify the # of teeth to be extracted. - If the patient is currently at the dentist's office, call Pre-Op Callback Staff (MA/nurse) to input urgent request.  - If the patient is not currently in the dentist office, please route to the Pre-Op pool.  Request for surgical clearance:  What type of surgery is being performed? Exchange of right breast tissue expander for gel implant and left breast reduction  When is this surgery scheduled? 06/27/21   Are there any medications that need to be held prior to surgery and how Dexheimer? Not noted  Practice name and name of physician performing surgery? Dr. Mingo [facility]  What is the office phone number? [phone]   7.   What is the office fax number? [phone]  8.   Anesthesia type (None, local, MAC, general) ? General   Pilar A Ham 06/14/2021, 10:47 AM  _________________________________________________________________   (provider comments below)

## 2021-06-14 NOTE — Telephone Encounter (Signed)
If Ms. Haith is asymptomatic, I think it is would be reasonable for her to proceed with surgery without additional cardiac testing/intervention.  Nelva Bush, MD [facility]

## 2021-06-14 NOTE — Telephone Encounter (Signed)
   Name: Heather Bell  DOB: 1975/11/01  MRN: [id number]   Primary Cardiologist: Nelva Bush, MD  Chart revisited as patient returned call. I reached out to patient for update on how she is doing. The patient affirms she has been doing well without any new cardiac symptoms. Therefore, based on ACC/AHA guidelines, the patient would be at acceptable risk for the planned procedure without further cardiovascular testing. The patient was advised that if she develops new symptoms prior to surgery to contact our office to arrange for a follow-up visit, and she verbalized understanding.   Will route this bundled recommendation to requesting provider via Epic fax function. Please call with questions.  Charlie Pitter, PA-C 06/14/2021, 2:33 PM

## 2021-06-19 ENCOUNTER — Encounter (HOSPITAL_BASED_OUTPATIENT_CLINIC_OR_DEPARTMENT_OTHER): Payer: Self-pay | Admitting: Plastic Surgery

## 2021-06-19 ENCOUNTER — Other Ambulatory Visit: Payer: Self-pay

## 2021-06-20 ENCOUNTER — Inpatient Hospital Stay (HOSPITAL_BASED_OUTPATIENT_CLINIC_OR_DEPARTMENT_OTHER): Payer: BC Managed Care – PPO | Admitting: Internal Medicine

## 2021-06-20 ENCOUNTER — Encounter: Payer: BC Managed Care – PPO | Attending: Oncology

## 2021-06-20 ENCOUNTER — Other Ambulatory Visit: Payer: Self-pay

## 2021-06-20 ENCOUNTER — Inpatient Hospital Stay: Payer: BC Managed Care – PPO | Attending: Oncology

## 2021-06-20 VITALS — Ht 66.25 in | Wt 157.8 lb

## 2021-06-20 DIAGNOSIS — Z171 Estrogen receptor negative status [ER-]: Secondary | ICD-10-CM | POA: Diagnosis not present

## 2021-06-20 DIAGNOSIS — D649 Anemia, unspecified: Secondary | ICD-10-CM | POA: Diagnosis not present

## 2021-06-20 DIAGNOSIS — R5383 Other fatigue: Secondary | ICD-10-CM | POA: Diagnosis not present

## 2021-06-20 DIAGNOSIS — C50511 Malignant neoplasm of lower-outer quadrant of right female breast: Secondary | ICD-10-CM | POA: Insufficient documentation

## 2021-06-20 DIAGNOSIS — E119 Type 2 diabetes mellitus without complications: Secondary | ICD-10-CM | POA: Diagnosis not present

## 2021-06-20 DIAGNOSIS — E538 Deficiency of other specified B group vitamins: Secondary | ICD-10-CM | POA: Insufficient documentation

## 2021-06-20 DIAGNOSIS — Z801 Family history of malignant neoplasm of trachea, bronchus and lung: Secondary | ICD-10-CM | POA: Insufficient documentation

## 2021-06-20 LAB — CBC WITH DIFFERENTIAL/PLATELET
Abs Immature Granulocytes: 0.03 10*3/uL (ref 0.00–0.07)
Basophils Absolute: 0 10*3/uL (ref 0.0–0.1)
Basophils Relative: 1 %
Eosinophils Absolute: 0.2 10*3/uL (ref 0.0–0.5)
Eosinophils Relative: 3 %
HCT: 37.2 % (ref 36.0–46.0)
Hemoglobin: 12 g/dL (ref 12.0–15.0)
Immature Granulocytes: 1 %
Lymphocytes Relative: 29 %
Lymphs Abs: 1.9 10*3/uL (ref 0.7–4.0)
MCH: 29.2 pg (ref 26.0–34.0)
MCHC: 32.3 g/dL (ref 30.0–36.0)
MCV: 90.5 fL (ref 80.0–100.0)
Monocytes Absolute: 0.4 10*3/uL (ref 0.1–1.0)
Monocytes Relative: 6 %
Neutro Abs: 4.1 10*3/uL (ref 1.7–7.7)
Neutrophils Relative %: 60 %
Platelets: 321 10*3/uL (ref 150–400)
RBC: 4.11 MIL/uL (ref 3.87–5.11)
RDW: 12.8 % (ref 11.5–15.5)
WBC: 6.7 10*3/uL (ref 4.0–10.5)
nRBC: 0 % (ref 0.0–0.2)

## 2021-06-20 LAB — COMPREHENSIVE METABOLIC PANEL
ALT: 13 U/L (ref 0–44)
AST: 14 U/L — ABNORMAL LOW (ref 15–41)
Albumin: 4.4 g/dL (ref 3.5–5.0)
Alkaline Phosphatase: 89 U/L (ref 38–126)
Anion gap: 11 (ref 5–15)
BUN: 11 mg/dL (ref 6–20)
CO2: 24 mmol/L (ref 22–32)
Calcium: 9.5 mg/dL (ref 8.9–10.3)
Chloride: 106 mmol/L (ref 98–111)
Creatinine, Ser: 0.75 mg/dL (ref 0.44–1.00)
GFR, Estimated: >60 mL/min (ref >60)
Glucose, Bld: 132 mg/dL — ABNORMAL HIGH (ref 70–99)
Potassium: 3.6 mmol/L (ref 3.5–5.1)
Sodium: 141 mmol/L (ref 135–145)
Total Bilirubin: 0.3 mg/dL (ref 0.3–1.2)
Total Protein: 7.7 g/dL (ref 6.5–8.1)

## 2021-06-20 NOTE — Progress Notes (Signed)
Daily Session Note  Patient Details  Name: Heather Bell MRN: [id number] Date of Birth: January 28, 1976 Referring Provider:   April Manson Cancer Associated Rehabilitation & Exercise from 02/14/2021 in [facility] Cardiac and Pulmonary Rehab  Referring Provider [name]       Encounter Date: 06/20/2021  Check In:  Session Check In - 06/20/21 1235       Check-In   Supervising physician immediately available to respond to emergencies See telemetry face sheet for immediately available ER MD    Location [facility]-Cardiac & Pulmonary Rehab    Staff Present Birdie Sons, MPA, Nino Glow, MS, ASCM CEP, Exercise Physiologist;Jessica Luan Pulling, MA, RCEP, CCRP, CCET    Virtual Visit No    Medication changes reported     No    Fall or balance concerns reported    No    Warm-up and Cool-down Performed on first and last piece of equipment    Resistance Training Performed Yes    VAD Patient? No    PAD/SET Patient? No      Pain Assessment   Currently in Pain? No/denies                Social History   Tobacco Use  Smoking Status Never  Smokeless Tobacco Never    Goals Met:  Independence with exercise equipment Exercise tolerated well No report of concerns or symptoms today Strength training completed today  Goals Unmet:  Not Applicable  Comments: Pt able to follow exercise prescription today without complaint.  Will continue to monitor for progression.    Dr. Avrie Filbert is Medical Director for [facility].  Dr. Ottie Glazier is Medical Director for [facility] Pulmonary Rehabilitation.

## 2021-06-20 NOTE — Assessment & Plan Note (Addendum)
#   Clinical stageIIB triple negativeright breast cancer: s/p neoadjuvant chemotherapy followed by right total mastectomy and sentinel lymph node biopsy-pathology complete pathologic response.  Clinically no evidence of recurrence.  December 2021 mammogram left side normal.  Reconstruction-right breast May 2022. STABLE.  Ordered mammogram.  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 STABLE; awaiting in dec 2022.  #  Normocytic anemia: S/p IV iron infusion- [feb 3794] ? OCT 2022- Hb 12.- STABLE; monitor for now.  #B12 deficiency: B12 - high; on p.o. B12; recommend PO QD  # Fatigue: continue care program-stable.  # Genetics-discussed- previous testing done; reviewed the expanded testing done; negative for any deleterious mutations.  # BMD- none; discuss  # DISPOSITION: # Mammogram in dec 2022 # follow up in 6 months- MD; labs- cbc/cmp/ca27-29-Dr.B

## 2021-06-20 NOTE — Progress Notes (Signed)
[place] NOTE  Patient Care Team: [place] as PCP - General End, Harrell Gave, MD as PCP - Cardiology (Cardiology)  CHIEF COMPLAINTS/PURPOSE OF CONSULTATION: Triple negative breast cancer  #  Oncology History Overview Note  #  Clinical stage IIB triple negative right breast cancer: s/p neoadjuvant chemotherapy followed by right total mastectomy and sentinel lymph node biopsy.             Pathology revealed a complete pathologic remission.             She is s/p removal of right tissue expander.             Clinically, she continues to do well.                 Exam reveals no evidence of recurrent disease.             Encourage monthly breast self-exam.             Left mammogram on 08/09/2020 revealed no evidence of malignancy.             She had a port removed by Dr. Su Monks is on 10/06/2020.             She is scheduled for breast reconstruction with tissue expanders by Dr. Joellyn Rued   Carcinoma of lower-outer quadrant of right breast in female, estrogen receptor negative ([place])  09/28/2019 Initial Diagnosis   Carcinoma of lower-outer quadrant of right breast in female, estrogen receptor negative ([place])      HISTORY OF PRESENTING ILLNESS:  Heather Bell 45 y.o.  female history of triple negative breast cancer stage II currently on surveillance; history of iron deficiency anemia is here for follow-up.  Ongoing fatigue.  Currently improved with [facility] care program.  Otherwise patient denies any blood in stools or black or stools.  Denies any unusual shortness of breath or cough.  Denies any swelling in the legs.   Review of Systems  Constitutional:  Positive for malaise/fatigue. Negative for chills, diaphoresis, fever and weight loss.  HENT:  Negative for nosebleeds and sore throat.   Eyes:  Negative for double vision.  Respiratory:  Negative for cough, hemoptysis, sputum production, shortness of breath and wheezing.   Cardiovascular:   Negative for chest pain, palpitations, orthopnea and leg swelling.  Gastrointestinal:  Negative for abdominal pain, blood in stool, constipation, diarrhea, heartburn, melena, nausea and vomiting.  Genitourinary:  Negative for dysuria, frequency and urgency.  Musculoskeletal:  Positive for joint pain.  Skin: Negative.  Negative for itching and rash.  Neurological:  Negative for dizziness, tingling, focal weakness, weakness and headaches.  Endo/Heme/Allergies:  Does not bruise/bleed easily.  Psychiatric/Behavioral:  Negative for depression. The patient is not nervous/anxious and does not have insomnia.     MEDICAL HISTORY:  Past Medical History:  Diagnosis Date   Allergic rhinitis    Anxiety    Bipolar disorder ([place])    depression   Breast cancer ([place]) 03/21/2020   Right mastectomy   Cancer ([place])    breast right   Depression    Diabetes ([place])    GERD (gastroesophageal reflux disease)    Hyperlipidemia    Personal history of chemotherapy    Jan 2021 through July 2021    SURGICAL HISTORY: Past Surgical History:  Procedure Laterality Date   BREAST BIOPSY Right 07/22/2019   mass bx, path pending, heart marker   BREAST BIOPSY Right 07/22/2019   LN bx, path  pending,  butterfly hydromarker   BREAST RECONSTRUCTION WITH PLACEMENT OF TISSUE EXPANDER AND FLEX HD (ACELLULAR HYDRATED DERMIS) Right 03/21/2020   Procedure: RIGHT BREAST RECONSTRUCTION WITH PLACEMENT OF TISSUE EXPANDER AND FLEX HD (ACELLULAR HYDRATED DERMIS);  Surgeon: [name], MD;  Location: [facility];  Service: Plastics;  Laterality: Right;   BREAST RECONSTRUCTION WITH PLACEMENT OF TISSUE EXPANDER AND FLEX HD (ACELLULAR HYDRATED DERMIS) Right 12/27/2020   Procedure: BREAST RECONSTRUCTION WITH PLACEMENT OF TISSUE EXPANDER AND FLEX HD (ACELLULAR HYDRATED DERMIS);  Surgeon: [name], MD;  Location: [place];  Service: Plastics;  Laterality: Right;   IMAGE GUIDED SINUS SURGERY     INCISION AND DRAINAGE  OF WOUND Right 05/02/2020   Procedure: IRRIGATION AND DEBRIDEMENT WOUND WITH REMOVAL OF RIGHT TISSUE EXPANDER;  Surgeon: [name], MD;  Location: [place];  Service: Plastics;  Laterality: Right;  1 hour please   KNEE SURGERY     MASTECTOMY Right 03/21/2020   NASAL SEPTUM SURGERY     PORTACATH PLACEMENT N/A 08/07/2019   Procedure: INSERTION PORT-A-CATH;  Surgeon: Herbert Pun, MD;  Location: [facility];  Service: General;  Laterality: N/A;   TONSILLECTOMY     TOTAL MASTECTOMY Right 03/21/2020   Procedure: TOTAL MASTECTOMY;  Surgeon: Herbert Pun, MD;  Location: [facility];  Service: General;  Laterality: Right;    SOCIAL HISTORY: Social History   Socioeconomic History   Marital status: Married    Spouse name: Not on file   Number of children: Not on file   Years of education: Not on file   Highest education level: Not on file  Occupational History   Not on file  Tobacco Use   Smoking status: Never   Smokeless tobacco: Never  Vaping Use   Vaping Use: Never used  Substance and Sexual Activity   Alcohol use: No    Alcohol/week: 0.0 standard drinks   Drug use: No   Sexual activity: Not on file  Other Topics Concern   Not on file  Social History Narrative   Not on file   Social Determinants of Health   Financial Resource Strain: Not on file  Food Insecurity: Not on file  Transportation Needs: Not on file  Physical Activity: Not on file  Stress: Not on file  Social Connections: Not on file  Intimate Partner Violence: Not on file    FAMILY HISTORY: Family History  Problem Relation Age of Onset   Lung cancer Father    Heart attack Father    Lung cancer Paternal Aunt    Lung cancer Paternal Uncle    Heart disease Paternal Uncle    Breast cancer Neg Hx     ALLERGIES:  is allergic to compazine [prochlorperazine edisylate], morphine and related, famotidine, doxycycline, fluticasone, and levofloxacin.  MEDICATIONS:  Current  Outpatient Medications  Medication Sig Dispense Refill   Accu-Chek FastClix Lancets MISC See admin instructions.     ACCU-CHEK GUIDE test strip SMARTSIG:1 Strip(s) Via Meter Daily PRN     amphetamine-dextroamphetamine (ADDERALL XR) 25 MG 24 hr capsule Take 25 mg by mouth daily.     Blood Glucose Monitoring Suppl (ACCU-CHEK GUIDE ME) w/Device KIT USE 1 DEVICE ONCE DAILY AS DIRECTED     buPROPion (WELLBUTRIN XL) 300 MG 24 hr tablet Take 300 mg by mouth every morning.     Calcium Carb-Cholecalciferol (CALCIUM 600+D) 600-800 MG-UNIT TABS Take 1 tablet by mouth daily.     cetirizine (ZYRTEC) 10 MG tablet Take 10  mg by mouth daily.      diazepam (VALIUM) 10 MG tablet Take 10 mg by mouth. As needed prior to procedures     doxepin (SINEQUAN) 25 MG capsule Take 25-50 mg by mouth at bedtime.      ibuprofen (ADVIL) 200 MG tablet Take 400-800 mg by mouth every 6 (six) hours as needed for headache or moderate pain.     lamoTRIgine (LAMICTAL) 150 MG tablet Take 150 mg by mouth daily.     lurasidone (LATUDA) 40 MG TABS tablet Take 40 mg by mouth at bedtime.      Lurasidone HCl 60 MG TABS Take 60 mg by mouth at bedtime.      metFORMIN (GLUCOPHAGE-XR) 500 MG 24 hr tablet Take 500 mg by mouth 2 (two) times daily.     mometasone (NASONEX) 50 MCG/ACT nasal spray Place 2 sprays into the nose daily as needed (allergies).      Multiple Vitamin (MULTIVITAMIN WITH MINERALS) TABS tablet Take 1 tablet by mouth daily.     omeprazole (PRILOSEC) 20 MG capsule Take 20 mg by mouth daily before breakfast.      ondansetron (ZOFRAN ODT) 4 MG disintegrating tablet Take 1 tablet (4 mg total) by mouth every 8 (eight) hours as needed for nausea or vomiting. 20 tablet 0   QUEtiapine (SEROQUEL) 300 MG tablet Take 300 mg by mouth at bedtime.     simvastatin (ZOCOR) 20 MG tablet Take 20 mg by mouth daily.     sulfamethoxazole-trimethoprim (BACTRIM DS) 800-160 MG tablet Take 1 tablet by mouth 2 (two) times daily for 14 days. 28 tablet 0    topiramate (TOPAMAX) 100 MG tablet Take 50-100 mg by mouth See admin instructions. Take 1 tablet (100 mg) by mouth in the morning & take 0.5 tablet (50 mg) by mouth at night.     vitamin B-12 (CYANOCOBALAMIN) 1000 MCG tablet Take 1,000 mcg by mouth daily.     No current facility-administered medications for this visit.   Facility-Administered Medications Ordered in Other Visits  Medication Dose Route Frequency Provider Last Rate Last Admin   0.9 %  sodium chloride infusion   Intravenous Continuous Lequita Asal, MD   Stopped at 01/08/20 1516   sodium chloride flush (NS) 0.9 % injection 10 mL  10 mL Intravenous PRN Nolon Stalls C, MD   10 mL at 01/08/20 1314      .  PHYSICAL EXAMINATION: ECOG PERFORMANCE STATUS: 0 - Asymptomatic  Vitals:   06/20/21 1016  BP: 105/71  Pulse: (!) 105  Resp: 18  Temp: 98.9 F (37.2 C)  SpO2: 100%   Filed Weights   06/20/21 1016  Weight: 159 lb (72.1 kg)    Physical Exam Vitals and nursing note reviewed.  Constitutional:      Comments:       HENT:     Head: Normocephalic and atraumatic.     Mouth/Throat:     Pharynx: Oropharynx is clear.  Eyes:     Extraocular Movements: Extraocular movements intact.     Pupils: Pupils are equal, round, and reactive to light.  Cardiovascular:     Rate and Rhythm: Normal rate and regular rhythm.  Pulmonary:     Comments: Decreased breath sounds bilaterally.  Abdominal:     Palpations: Abdomen is soft.  Musculoskeletal:        General: Normal range of motion.     Cervical back: Normal range of motion.  Skin:    General: Skin is warm.  Neurological:     General: No focal deficit present.     Mental Status: She is alert and oriented to person, place, and time.  Psychiatric:        Behavior: Behavior normal.        Judgment: Judgment normal.    LABORATORY DATA:  I have reviewed the data as listed Lab Results  Component Value Date   WBC 6.7 06/20/2021   HGB 12.0 06/20/2021    HCT 37.2 06/20/2021   MCV 90.5 06/20/2021   PLT 321 06/20/2021   Recent Labs    12/19/20 0916 03/21/21 0827 06/20/21 0952  NA 139 140 141  K 3.5 3.3* 3.6  CL 105 109 106  CO2 '25 25 24  ' GLUCOSE 90 127* 132*  BUN '14 13 11  ' CREATININE 0.73 0.87 0.75  CALCIUM 9.4 9.3 9.5  GFRNONAA >60 >60 >60  PROT 7.4 7.4 7.7  ALBUMIN 4.1 4.1 4.4  AST 13* 16 14*  ALT '11 13 13  ' ALKPHOS 70 76 89  BILITOT 0.4 0.4 0.3    RADIOGRAPHIC STUDIES: I have personally reviewed the radiological images as listed and agreed with the findings in the report. No results found.  ASSESSMENT & PLAN:   Carcinoma of lower-outer quadrant of right breast in female, estrogen receptor negative ([place]) #  Clinical stage IIB triple negative right breast cancer: s/p neoadjuvant chemotherapy followed by right total mastectomy and sentinel lymph node biopsy-pathology complete pathologic response.  Clinically no evidence of recurrence.  December 2021 mammogram left side normal.  Reconstruction-right breast May 2022. STABLE.  Ordered mammogram.  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 STABLE; awaiting in dec 2022.  #  Normocytic anemia: S/p IV iron infusion- [feb 1324] ? OCT 2022- Hb 12.- STABLE; monitor for now.             # B12 deficiency: B12 - high; on p.o. B12; recommend PO QD  # Fatigue: continue care program-stable.  # Genetics-discussed- previous testing done; reviewed the expanded testing done; negative for any deleterious mutations.  # BMD- none; discuss  # DISPOSITION: # Mammogram in dec 2022 # follow up in 6 months- MD; labs- cbc/cmp/ca27-29-Dr.B   All questions were answered. The patient knows to call the clinic with any problems, questions or concerns.    Cammie Sickle, MD 06/20/2021 12:51 PM

## 2021-06-21 LAB — CANCER ANTIGEN 27.29: CA 27.29: 5.3 U/mL (ref 0.0–38.6)

## 2021-06-22 ENCOUNTER — Other Ambulatory Visit: Payer: Self-pay

## 2021-06-22 DIAGNOSIS — C50511 Malignant neoplasm of lower-outer quadrant of right female breast: Secondary | ICD-10-CM

## 2021-06-22 NOTE — Progress Notes (Signed)
Daily Session Note  Patient Details  Name: Heather Bell MRN: [id number] Date of Birth: 03/30/1976 Referring Provider:   April Manson Cancer Associated Rehabilitation & Exercise from 02/14/2021 in [facility] Cardiac and Pulmonary Rehab  Referring Provider [name]       Encounter Date: 06/22/2021  Check In:  Session Check In - 06/22/21 1351       Check-In   Supervising physician immediately available to respond to emergencies See telemetry face sheet for immediately available ER MD    Location [facility]-Cardiac & Pulmonary Rehab    Staff Present Coralie Keens, MS, ASCM CEP, Exercise Physiologist;Amanda Oletta Darter, BA, ACSM CEP, Exercise Physiologist    Virtual Visit No    Medication changes reported     No    Fall or balance concerns reported    No    Warm-up and Cool-down Performed on first and last piece of equipment    Resistance Training Performed Yes    VAD Patient? No    PAD/SET Patient? No                Social History   Tobacco Use  Smoking Status Never  Smokeless Tobacco Never    Goals Met:  Independence with exercise equipment Exercise tolerated well No report of concerns or symptoms today Strength training completed today  Goals Unmet:  Not Applicable  Comments: Pt able to follow exercise prescription today without complaint.  Will continue to monitor for progression.    Dr. Neenah Filbert is Medical Director for [facility].  Dr. Ottie Glazier is Medical Director for [facility] Pulmonary Rehabilitation.

## 2021-06-27 ENCOUNTER — Ambulatory Visit (HOSPITAL_BASED_OUTPATIENT_CLINIC_OR_DEPARTMENT_OTHER): Payer: BC Managed Care – PPO | Admitting: Anesthesiology

## 2021-06-27 ENCOUNTER — Ambulatory Visit (HOSPITAL_BASED_OUTPATIENT_CLINIC_OR_DEPARTMENT_OTHER)
Admission: RE | Admit: 2021-06-27 | Discharge: 2021-06-27 | Disposition: A | Discharge status: Home / Self Care | Payer: BC Managed Care – PPO | Attending: Plastic Surgery | Admitting: Plastic Surgery

## 2021-06-27 ENCOUNTER — Encounter (HOSPITAL_BASED_OUTPATIENT_CLINIC_OR_DEPARTMENT_OTHER): Payer: Self-pay | Admitting: Plastic Surgery

## 2021-06-27 ENCOUNTER — Encounter (HOSPITAL_BASED_OUTPATIENT_CLINIC_OR_DEPARTMENT_OTHER)
Admission: RE | Disposition: A | Discharge status: Home / Self Care | Payer: Self-pay | Source: Home / Self Care | Attending: Plastic Surgery

## 2021-06-27 ENCOUNTER — Other Ambulatory Visit: Payer: Self-pay

## 2021-06-27 DIAGNOSIS — Z7984 Long term (current) use of oral hypoglycemic drugs: Secondary | ICD-10-CM | POA: Diagnosis not present

## 2021-06-27 DIAGNOSIS — F419 Anxiety disorder, unspecified: Secondary | ICD-10-CM | POA: Insufficient documentation

## 2021-06-27 DIAGNOSIS — F319 Bipolar disorder, unspecified: Secondary | ICD-10-CM | POA: Diagnosis not present

## 2021-06-27 DIAGNOSIS — C50911 Malignant neoplasm of unspecified site of right female breast: Secondary | ICD-10-CM

## 2021-06-27 DIAGNOSIS — Z853 Personal history of malignant neoplasm of breast: Secondary | ICD-10-CM | POA: Diagnosis not present

## 2021-06-27 DIAGNOSIS — E119 Type 2 diabetes mellitus without complications: Secondary | ICD-10-CM | POA: Insufficient documentation

## 2021-06-27 DIAGNOSIS — D649 Anemia, unspecified: Secondary | ICD-10-CM | POA: Insufficient documentation

## 2021-06-27 DIAGNOSIS — Z45811 Encounter for adjustment or removal of right breast implant: Secondary | ICD-10-CM | POA: Diagnosis present

## 2021-06-27 DIAGNOSIS — N6489 Other specified disorders of breast: Secondary | ICD-10-CM | POA: Insufficient documentation

## 2021-06-27 DIAGNOSIS — K219 Gastro-esophageal reflux disease without esophagitis: Secondary | ICD-10-CM | POA: Diagnosis not present

## 2021-06-27 DIAGNOSIS — C773 Secondary and unspecified malignant neoplasm of axilla and upper limb lymph nodes: Secondary | ICD-10-CM

## 2021-06-27 HISTORY — PX: REMOVAL OF TISSUE EXPANDER AND PLACEMENT OF IMPLANT: SHX6457

## 2021-06-27 HISTORY — PX: BREAST REDUCTION SURGERY: SHX8

## 2021-06-27 LAB — GLUCOSE, CAPILLARY
Glucose-Capillary: 102 mg/dL — ABNORMAL HIGH (ref 70–99)
Glucose-Capillary: 127 mg/dL — ABNORMAL HIGH (ref 70–99)

## 2021-06-27 LAB — POCT PREGNANCY, URINE: Preg Test, Ur: NEGATIVE

## 2021-06-27 SURGERY — REMOVAL, TISSUE EXPANDER, BREAST, WITH IMPLANT INSERTION
Anesthesia: General | Site: Breast | Laterality: Right

## 2021-06-27 MED ORDER — HYDROMORPHONE HCL 1 MG/ML IJ SOLN
INTRAMUSCULAR | Status: AC
  Filled 2021-06-27: qty 0.5

## 2021-06-27 MED ORDER — LACTATED RINGERS IV SOLN: INTRAVENOUS | Status: DC

## 2021-06-27 MED ORDER — PROPOFOL 10 MG/ML IV BOLUS
INTRAVENOUS | Status: AC
  Filled 2021-06-27: qty 20

## 2021-06-27 MED ORDER — OXYCODONE HCL 5 MG PO TABS
5.0000 mg | ORAL_TABLET | Freq: Once | ORAL | Status: AC
  Administered 2021-06-27: 5 mg via ORAL

## 2021-06-27 MED ORDER — CEFAZOLIN SODIUM-DEXTROSE 2-4 GM/100ML-% IV SOLN
INTRAVENOUS | Status: AC
  Filled 2021-06-27: qty 100

## 2021-06-27 MED ORDER — FENTANYL CITRATE (PF) 100 MCG/2ML IJ SOLN
INTRAMUSCULAR | Status: AC
  Filled 2021-06-27: qty 2

## 2021-06-27 MED ORDER — OXYCODONE HCL 5 MG PO TABS
ORAL_TABLET | ORAL | Status: AC
  Filled 2021-06-27: qty 1

## 2021-06-27 MED ORDER — HYDROMORPHONE HCL 1 MG/ML IJ SOLN
0.2500 mg | INTRAMUSCULAR | Status: DC | PRN
  Administered 2021-06-27: 0.5 mg via INTRAVENOUS

## 2021-06-27 MED ORDER — DEXAMETHASONE SODIUM PHOSPHATE 10 MG/ML IJ SOLN
INTRAMUSCULAR | Status: AC
  Filled 2021-06-27: qty 1

## 2021-06-27 MED ORDER — CHLORHEXIDINE GLUCONATE CLOTH 2 % EX PADS: 6.0000 | MEDICATED_PAD | Freq: Once | CUTANEOUS | Status: DC

## 2021-06-27 MED ORDER — LACTATED RINGERS IV SOLN: INTRAVENOUS | Status: DC | PRN

## 2021-06-27 MED ORDER — ACETAMINOPHEN 500 MG PO TABS
ORAL_TABLET | ORAL | Status: AC
  Filled 2021-06-27: qty 2

## 2021-06-27 MED ORDER — LACTATED RINGERS IV SOLN
INTRAVENOUS | Status: DC | PRN
  Administered 2021-06-27: 200 mL

## 2021-06-27 MED ORDER — DEXAMETHASONE SODIUM PHOSPHATE 10 MG/ML IJ SOLN
INTRAMUSCULAR | Status: DC | PRN
  Administered 2021-06-27: 5 mg via INTRAVENOUS

## 2021-06-27 MED ORDER — 0.9 % SODIUM CHLORIDE (POUR BTL) OPTIME
TOPICAL | Status: DC | PRN
  Administered 2021-06-27: 200 mL

## 2021-06-27 MED ORDER — FENTANYL CITRATE (PF) 100 MCG/2ML IJ SOLN
25.0000 ug | INTRAMUSCULAR | Status: DC | PRN
  Administered 2021-06-27 (×3): 50 ug via INTRAVENOUS

## 2021-06-27 MED ORDER — CEFAZOLIN SODIUM-DEXTROSE 2-3 GM-%(50ML) IV SOLR
INTRAVENOUS | Status: DC | PRN
Start: 2021-06-27 — End: 2021-06-27
  Administered 2021-06-27: 2 g via INTRAVENOUS

## 2021-06-27 MED ORDER — SODIUM CHLORIDE 0.9 % IV SOLN
INTRAVENOUS | Status: AC
  Filled 2021-06-27: qty 10

## 2021-06-27 MED ORDER — SODIUM CHLORIDE 0.9 % IV SOLN
INTRAVENOUS | Status: DC | PRN
  Administered 2021-06-27: 500 mL

## 2021-06-27 MED ORDER — LIDOCAINE 2% (20 MG/ML) 5 ML SYRINGE
INTRAMUSCULAR | Status: AC
  Filled 2021-06-27: qty 5

## 2021-06-27 MED ORDER — LIDOCAINE HCL (CARDIAC) PF 100 MG/5ML IV SOSY
PREFILLED_SYRINGE | INTRAVENOUS | Status: DC | PRN
  Administered 2021-06-27: 60 mg via INTRAVENOUS

## 2021-06-27 MED ORDER — ONDANSETRON HCL 4 MG/2ML IJ SOLN
INTRAMUSCULAR | Status: DC | PRN
  Administered 2021-06-27: 4 mg via INTRAVENOUS

## 2021-06-27 MED ORDER — CEFAZOLIN SODIUM-DEXTROSE 2-4 GM/100ML-% IV SOLN: 2.0000 g | INTRAVENOUS | Status: DC

## 2021-06-27 MED ORDER — ONDANSETRON HCL 4 MG/2ML IJ SOLN
INTRAMUSCULAR | Status: AC
  Filled 2021-06-27: qty 2

## 2021-06-27 MED ORDER — FENTANYL CITRATE (PF) 100 MCG/2ML IJ SOLN
INTRAMUSCULAR | Status: DC | PRN
  Administered 2021-06-27: 25 ug via INTRAVENOUS
  Administered 2021-06-27: 100 ug via INTRAVENOUS
  Administered 2021-06-27 (×2): 25 ug via INTRAVENOUS

## 2021-06-27 MED ORDER — ACETAMINOPHEN 500 MG PO TABS
1000.0000 mg | ORAL_TABLET | Freq: Once | ORAL | Status: AC
  Administered 2021-06-27: 1000 mg via ORAL

## 2021-06-27 MED ORDER — PROPOFOL 10 MG/ML IV BOLUS
INTRAVENOUS | Status: DC | PRN
  Administered 2021-06-27: 150 mg via INTRAVENOUS

## 2021-06-27 SURGICAL SUPPLY — 92 items
APL PRP STRL LF DISP 70% ISPRP (MISCELLANEOUS) ×4
APL SKNCLS STERI-STRIP NONHPOA (GAUZE/BANDAGES/DRESSINGS) ×4
BAG DECANTER FOR FLEXI CONT (MISCELLANEOUS) ×3 IMPLANT
BENZOIN TINCTURE PRP APPL 2/3 (GAUZE/BANDAGES/DRESSINGS) ×6 IMPLANT
BIOPATCH RED 1 DISK 7.0 (GAUZE/BANDAGES/DRESSINGS) IMPLANT
BLADE SURG 10 STRL SS (BLADE) ×6 IMPLANT
BLADE SURG 15 STRL LF DISP TIS (BLADE) ×2 IMPLANT
BLADE SURG 15 STRL SS (BLADE) ×3
BNDG CMPR MED 10X6 ELC LF (GAUZE/BANDAGES/DRESSINGS) ×2
BNDG ELASTIC 6X10 VLCR STRL LF (GAUZE/BANDAGES/DRESSINGS) ×3 IMPLANT
BNDG GAUZE ELAST 4 BULKY (GAUZE/BANDAGES/DRESSINGS) IMPLANT
CANISTER SUCT 1200ML W/VALVE (MISCELLANEOUS) ×3 IMPLANT
CHLORAPREP W/TINT 26 (MISCELLANEOUS) ×6 IMPLANT
COVER BACK TABLE 60X90IN (DRAPES) ×3 IMPLANT
COVER MAYO STAND STRL (DRAPES) ×3 IMPLANT
DECANTER SPIKE VIAL GLASS SM (MISCELLANEOUS) IMPLANT
DRAIN CHANNEL 15F RND FF W/TCR (WOUND CARE) IMPLANT
DRAIN PENROSE 1/2X12 LTX STRL (WOUND CARE) IMPLANT
DRAPE INCISE IOBAN 66X45 STRL (DRAPES) IMPLANT
DRAPE LAPAROSCOPIC ABDOMINAL (DRAPES) ×3 IMPLANT
DRAPE TOP ARMCOVERS (MISCELLANEOUS) ×3 IMPLANT
DRAPE UTILITY XL STRL (DRAPES) ×3 IMPLANT
DRSG PAD ABDOMINAL 8X10 ST (GAUZE/BANDAGES/DRESSINGS) ×6 IMPLANT
ELECT BLADE 4.0 EZ CLEAN MEGAD (MISCELLANEOUS)
ELECT REM PT RETURN 9FT ADLT (ELECTROSURGICAL) ×3
ELECTRODE BLDE 4.0 EZ CLN MEGD (MISCELLANEOUS) IMPLANT
ELECTRODE REM PT RTRN 9FT ADLT (ELECTROSURGICAL) ×2 IMPLANT
EVACUATOR SILICONE 100CC (DRAIN) IMPLANT
FUNNEL KELLER 2 DISP (MISCELLANEOUS) ×3 IMPLANT
GAUZE SPONGE 4X4 12PLY STRL (GAUZE/BANDAGES/DRESSINGS) ×6 IMPLANT
GLOVE SRG 8 PF TXTR STRL LF DI (GLOVE) ×4 IMPLANT
GLOVE SURG ENC MOIS LTX SZ7.5 (GLOVE) ×3 IMPLANT
GLOVE SURG ENC TEXT LTX SZ7.5 (GLOVE) ×3 IMPLANT
GLOVE SURG UNDER POLY LF SZ6.5 (GLOVE) ×3 IMPLANT
GLOVE SURG UNDER POLY LF SZ7 (GLOVE) ×3 IMPLANT
GLOVE SURG UNDER POLY LF SZ8 (GLOVE) ×6
GOWN STRL REUS W/ TWL LRG LVL3 (GOWN DISPOSABLE) ×6 IMPLANT
GOWN STRL REUS W/TWL 2XL LVL3 (GOWN DISPOSABLE) ×3 IMPLANT
GOWN STRL REUS W/TWL LRG LVL3 (GOWN DISPOSABLE) ×9
IMPL BREAST P4.7XMDRT 465 (Breast) ×2 IMPLANT
IMPL BRST P4.7XMDRT 465CC (Breast) ×2 IMPLANT
IMPLANT BREAST GEL 465CC (Breast) ×3 IMPLANT
IV NS 500ML (IV SOLUTION)
IV NS 500ML BAXH (IV SOLUTION) IMPLANT
KIT FILL SYSTEM UNIVERSAL (SET/KITS/TRAYS/PACK) IMPLANT
MARKER SKIN DUAL TIP RULER LAB (MISCELLANEOUS) IMPLANT
NDL SAFETY ECLIPSE 18X1.5 (NEEDLE) ×2 IMPLANT
NEEDLE FILTER BLUNT 18X 1/2SAF (NEEDLE) ×1
NEEDLE FILTER BLUNT 18X1 1/2 (NEEDLE) ×2 IMPLANT
NEEDLE HYPO 18GX1.5 SHARP (NEEDLE) ×3
NEEDLE HYPO 25X1 1.5 SAFETY (NEEDLE) IMPLANT
NEEDLE SPNL 18GX3.5 QUINCKE PK (NEEDLE) ×3 IMPLANT
NS IRRIG 1000ML POUR BTL (IV SOLUTION) ×3 IMPLANT
PACK BASIN DAY SURGERY FS (CUSTOM PROCEDURE TRAY) ×3 IMPLANT
PENCIL SMOKE EVACUATOR (MISCELLANEOUS) ×3 IMPLANT
PIN SAFETY STERILE (MISCELLANEOUS) IMPLANT
SHEET MEDIUM DRAPE 40X70 STRL (DRAPES) ×6 IMPLANT
SIZER BREAST REUSE 440CC (SIZER) ×3
SIZER BREAST REUSE 465CC (SIZER) ×3
SIZER BRST REUSE P4.5 440CC (SIZER) ×2 IMPLANT
SIZER BRST REUSE P4.7 465CC (SIZER) ×2 IMPLANT
SLEEVE SCD COMPRESS KNEE MED (STOCKING) ×3 IMPLANT
SPONGE T-LAP 18X18 ~~LOC~~+RFID (SPONGE) ×6 IMPLANT
STAPLER INSORB 30 2030 C-SECTI (MISCELLANEOUS) ×3 IMPLANT
STAPLER VISISTAT 35W (STAPLE) ×3 IMPLANT
STRIP CLOSURE SKIN 1/2X4 (GAUZE/BANDAGES/DRESSINGS) IMPLANT
STRIP SUTURE WOUND CLOSURE 1/2 (MISCELLANEOUS) ×9 IMPLANT
SUT ETHILON 2 0 FS 18 (SUTURE) IMPLANT
SUT MNCRL AB 4-0 PS2 18 (SUTURE) ×6 IMPLANT
SUT MON AB 3-0 SH 27 (SUTURE)
SUT MON AB 3-0 SH27 (SUTURE) IMPLANT
SUT PDS 3-0 CT2 (SUTURE) ×6
SUT PDS AB 2-0 CT2 27 (SUTURE) IMPLANT
SUT PDS II 3-0 CT2 27 ABS (SUTURE) ×4 IMPLANT
SUT VIC AB 3-0 PS1 18 (SUTURE)
SUT VIC AB 3-0 PS1 18XBRD (SUTURE) IMPLANT
SUT VIC AB 3-0 SH 27 (SUTURE)
SUT VIC AB 3-0 SH 27X BRD (SUTURE) IMPLANT
SUT VICRYL 4-0 PS2 18IN ABS (SUTURE) IMPLANT
SUT VLOC 180 0 24IN GS25 (SUTURE) IMPLANT
SUT VLOC 180 P-14 24 (SUTURE) IMPLANT
SUT VLOC 90 P-14 23 (SUTURE) ×6 IMPLANT
SYR 20ML LL LF (SYRINGE) IMPLANT
SYR 50ML LL SCALE MARK (SYRINGE) ×3 IMPLANT
SYR BULB IRRIG 60ML STRL (SYRINGE) ×3 IMPLANT
SYR CONTROL 10ML LL (SYRINGE) IMPLANT
TAPE MEASURE VINYL STERILE (MISCELLANEOUS) IMPLANT
TOWEL GREEN STERILE FF (TOWEL DISPOSABLE) ×6 IMPLANT
TUBE CONNECTING 20X1/4 (TUBING) ×6 IMPLANT
TUBING INFILTRATION IT-10001 (TUBING) ×3 IMPLANT
UNDERPAD 30X36 HEAVY ABSORB (UNDERPADS AND DIAPERS) ×6 IMPLANT
YANKAUER SUCT BULB TIP NO VENT (SUCTIONS) ×3 IMPLANT

## 2021-06-27 NOTE — Interval H&P Note (Signed)
History and Physical Interval Note:  06/27/2021 8:59 AM  Heather Bell  has presented today for surgery, with the diagnosis of Breast cancer metastasized to axillary lymph node.  The various methods of treatment have been discussed with the patient and family. After consideration of risks, benefits and other options for treatment, the patient has consented to  Procedure(s): Removal right breast tissue expander to gel implant (Right) Left breast reduction (Left) as a surgical intervention.  The patient's history has been reviewed, patient examined, no change in status, stable for surgery.  I have reviewed the patient's chart and labs.  Questions were answered to the patient's satisfaction.     Heather Bell

## 2021-06-27 NOTE — Anesthesia Postprocedure Evaluation (Signed)
Anesthesia Post Note  Patient: Heather Bell  Procedure(s) Performed: Removal right breast tissue expander and placement of gel implant (Right: Breast) Left breast reduction (Left: Breast)     Patient location during evaluation: PACU Anesthesia Type: General Level of consciousness: awake and alert Pain management: pain level controlled Vital Signs Assessment: post-procedure vital signs reviewed and stable Respiratory status: spontaneous breathing, nonlabored ventilation and respiratory function stable Cardiovascular status: blood pressure returned to baseline and stable Postop Assessment: no apparent nausea or vomiting Anesthetic complications: no   No notable events documented.  Last Vitals:  Vitals:   06/27/21 1145 06/27/21 1220  BP: (!) 153/87 117/73  Pulse: 99 87  Resp: 15 16  Temp:  36.5 C  SpO2: 95% 94%    Last Pain:  Vitals:   06/27/21 1148  TempSrc:   PainSc: 4                  Heather Bell

## 2021-06-27 NOTE — Discharge Instructions (Addendum)
Activity As tolerated: NO showers for 3 days. Keep ACE wrap on breasts until then. After showering, put ACE wrap back on, this is important for compression. NO driving while in pain, taking pain medication or if you are unable to safely react to traffic. No heavy activities Take Pain medication (Norco) as needed for severe pain. Otherwise, you can use ibuprofen or tylenol as needed. Avoid more than 3,000 mg of tylenol in 24 hours. Norco has 325mg  of tylenol per dose/pill  Diet: Regular. Drink plenty of fluids and eat healthy, high protein, low carbs.  Wound Care: Keep dressing clean & dry. You may change bandages after showering if you continue to notice some drainage. You can reuse bandages if they are not dirty/soiled.  Special Instructions: Call Doctor if any unusual problems occur such as pain, excessive Bleeding, unrelieved Nausea/vomiting, Fever &/or chills  Follow-up appointment: Scheduled for next week.   Tylenol 1000 mg given at [place] Instructions  Activity: Get plenty of rest for the remainder of the day. A responsible individual must stay with you for 24 hours following the procedure.  For the next 24 hours, DO NOT: -Drive a car -Paediatric nurse -Drink alcoholic beverages -Take any medication unless instructed by your physician -Make any legal decisions or sign important papers.  Meals: Start with liquid foods such as gelatin or soup. Progress to regular foods as tolerated. Avoid greasy, spicy, heavy foods. If nausea and/or vomiting occur, drink only clear liquids until the nausea and/or vomiting subsides. Call your physician if vomiting continues.  Special Instructions/Symptoms: Your throat may feel dry or sore from the anesthesia or the breathing tube placed in your throat during surgery. If this causes discomfort, gargle with warm salt water. The discomfort should disappear within 24 hours.  If you had a scopolamine patch placed behind  your ear for the management of post- operative nausea and/or vomiting:  1. The medication in the patch is effective for 72 hours, after which it should be removed.  Wrap patch in a tissue and discard in the trash. Wash hands thoroughly with soap and water. 2. You may remove the patch earlier than 72 hours if you experience unpleasant side effects which may include dry mouth, dizziness or visual disturbances. 3. Avoid touching the patch. Wash your hands with soap and water after contact with the patch.      Next dose of Tylenol can be given at 2:30pm if needed.

## 2021-06-27 NOTE — Anesthesia Preprocedure Evaluation (Addendum)
Anesthesia Evaluation  Patient identified by MRN, date of birth, ID band Patient awake    Reviewed: Allergy & Precautions, H&P , NPO status , Patient's Chart, lab work & pertinent test results  Airway Mallampati: III  TM Distance: >3 FB Neck ROM: Full    Dental no notable dental hx. (+) Teeth Intact, Dental Advisory Given   Pulmonary neg pulmonary ROS,    Pulmonary exam normal breath sounds clear to auscultation       Cardiovascular negative cardio ROS   Rhythm:Regular Rate:Normal     Neuro/Psych Anxiety Depression Bipolar Disorder negative neurological ROS     GI/Hepatic Neg liver ROS, GERD  Medicated,  Endo/Other  diabetes, Type 2, Oral Hypoglycemic Agents  Renal/GU negative Renal ROS  negative genitourinary   Musculoskeletal   Abdominal   Peds  Hematology negative hematology ROS (+) Blood dyscrasia, anemia ,   Anesthesia Other Findings   Reproductive/Obstetrics negative OB ROS                            Anesthesia Physical Anesthesia Plan  ASA: 2  Anesthesia Plan: General   Post-op Pain Management:    Induction: Intravenous  PONV Risk Score and Plan: 4 or greater and Ondansetron, Dexamethasone and Midazolam  Airway Management Planned: LMA  Additional Equipment:   Intra-op Plan:   Post-operative Plan: Extubation in OR  Informed Consent: I have reviewed the patients History and Physical, chart, labs and discussed the procedure including the risks, benefits and alternatives for the proposed anesthesia with the patient or authorized representative who has indicated his/her understanding and acceptance.     Dental advisory given  Plan Discussed with: CRNA  Anesthesia Plan Comments:         Anesthesia Quick Evaluation

## 2021-06-27 NOTE — Anesthesia Procedure Notes (Signed)
Procedure Name: LMA Insertion Date/Time: 06/27/2021 9:42 AM Performed by: Verita Lamb, CRNA Pre-anesthesia Checklist: Patient identified, Emergency Drugs available, Suction available and Patient being monitored Patient Re-evaluated:Patient Re-evaluated prior to induction Oxygen Delivery Method: Circle system utilized Preoxygenation: Pre-oxygenation with 100% oxygen Induction Type: IV induction Ventilation: Mask ventilation without difficulty LMA: LMA inserted LMA Size: 4.0 Number of attempts: 1 Airway Equipment and Method: Bite block Placement Confirmation: positive ETCO2, CO2 detector and breath sounds checked- equal and bilateral Tube secured with: Tape Dental Injury: Teeth and Oropharynx as per pre-operative assessment

## 2021-06-27 NOTE — Op Note (Signed)
Operative Note   DATE OF OPERATION: 06/27/2021  SURGICAL DEPARTMENT: Plastic Surgery  PREOPERATIVE DIAGNOSES: History of right breast cancer and right breast reconstruction  POSTOPERATIVE DIAGNOSES:  same  PROCEDURE: 1.  Exchange of right breast tissue expander for gel implant 2.  Right breast capsulotomies superiorly and laterally 3.  Left breast reduction with superomedial pedicle  SURGEON: Talmadge Coventry, MD  ASSISTANT: Verdie Shire, PA The advanced practice practitioner (APP) assisted throughout the case.  The APP was essential in retraction and counter traction when needed to make the case progress smoothly.  This retraction and assistance made it possible to see the tissue planes for the procedure.  The assistance was needed for hemostasis, tissue re-approximation and closure of the incision site.   ANESTHESIA:  General.   COMPLICATIONS: None.   INDICATIONS FOR PROCEDURE:  The patient, Heather Bell is a 45 y.o. female born on October 17, 1975, is here for treatment of right breast reconstruction with left breast asymmetry MRN: [id number]  CONSENT:  Informed consent was obtained directly from the patient. Risks, benefits and alternatives were fully discussed. Specific risks including but not limited to bleeding, infection, hematoma, seroma, scarring, pain, contracture, asymmetry, wound healing problems, and need for further surgery were all discussed. The patient did have an ample opportunity to have questions answered to satisfaction.   DESCRIPTION OF PROCEDURE:  The patient was taken to the operating room. SCDs were placed and antibiotics were given.  General anesthesia was administered.  The patient's operative site was prepped and draped in a sterile fashion. A time out was performed and all information was confirmed to be correct.  Started by incising along her pre-existing scar on the right side.  Dissected down to the expander with cautery.  Saline was evacuated from the  expander and the expander was removed.  This was a bit narrower footprint then the other side so lighter retractor was utilized and a superior and lateral capsulotomy was performed to widen the footprint.  465 cc moderate plus profile sizer was then placed and seem to give it a much improved shape.  I then turned my attention to the other side.  I had marked her in the standing position preoperatively for a Wise pattern skin excision.  I then marked out a superomedial pedicle for the nipple areolar complex totaling at least 8 cm at the base.  A small amount of tumescent solution was then infiltrated and given time to work.  The pedicle was de-epithelialized with a 10 blade after marking the nipple with a 42 mm cookie cutter.  Remainder the incisions were then made and the excision was performed with knife and cautery.  Hemostasis was obtained.  Wound was stapled closed to check for symmetry.  Minor modifications were made.  Patient was then set up to check for shape size and symmetry which was the case.  The left side was then closed along the inframammary border with interrupted buried Enzor staplers.  The vertical limb was closed with interrupted buried 3-0 PDS sutures.  Periareolar limb closed with interrupted buried 4-0 Monocryl sutures.  A 3 oh V-Loc was then run throughout.  On the right side the sizer was removed.  I checked for hemostasis which was adequate.  The pocket was then irrigated with triple antibiotic solution.  I changed my gloves.  The final implant was then brought onto the field.  This was a Mentor moderate plus profile extra implant with 465 cc of volume.  Serial [id number].  This was  inserted with a Keller funnel and not touched.  The acellular dermal matrix was then reapproximated with interrupted buried 3-0 PDS sutures.  3-0 PDS deep dermal sutures were used for the skin followed by 3 OV lock.  This gave her great on table result.  Benzoin and Steri-Strips were applied followed by a  compressive dressing.  The patient tolerated the procedure well.  There were no complications. The patient was allowed to wake from anesthesia, extubated and taken to the recovery room in satisfactory condition.

## 2021-06-27 NOTE — Transfer of Care (Signed)
Immediate Anesthesia Transfer of Care Note  Patient: Heather Bell  Procedure(s) Performed: Removal right breast tissue expander and placement of gel implant (Right: Breast) Left breast reduction (Left: Breast)  Patient Location: PACU  Anesthesia Type:General  Level of Consciousness: awake, alert  and oriented  Airway & Oxygen Therapy: Patient Spontanous Breathing and Patient connected to face mask oxygen  Post-op Assessment: Report given to RN and Post -op Vital signs reviewed and stable  Post vital signs: Reviewed and stable  Last Vitals:  Vitals Value Taken Time  BP    Temp    Pulse 104 06/27/21 1101  Resp    SpO2 100 % 06/27/21 1101  Vitals shown include unvalidated device data.  Last Pain:  Vitals:   06/27/21 0828  TempSrc: Oral  PainSc: 0-No pain         Complications: No notable events documented.

## 2021-06-28 LAB — SURGICAL PATHOLOGY

## 2021-06-29 ENCOUNTER — Encounter (HOSPITAL_BASED_OUTPATIENT_CLINIC_OR_DEPARTMENT_OTHER): Payer: Self-pay | Admitting: Plastic Surgery

## 2021-07-04 ENCOUNTER — Other Ambulatory Visit: Payer: Self-pay

## 2021-07-04 DIAGNOSIS — Z171 Estrogen receptor negative status [ER-]: Secondary | ICD-10-CM

## 2021-07-04 DIAGNOSIS — C50511 Malignant neoplasm of lower-outer quadrant of right female breast: Secondary | ICD-10-CM

## 2021-07-04 NOTE — Progress Notes (Signed)
Daily Session Note  Patient Details  Name: Heather Bell MRN: [id number] Date of Birth: [birth year] Referring Provider:   April Manson Cancer Associated Rehabilitation & Exercise from 02/14/2021 in [facility] Cardiac and Pulmonary Rehab  Referring Provider [name]       Encounter Date: 07/04/2021  Check In:  Session Check In - 07/04/21 1227       Check-In   Supervising physician immediately available to respond to emergencies See telemetry face sheet for immediately available ER MD    Location [facility]-Cardiac & Pulmonary Rehab    Staff Present Birdie Sons, MPA, RN;Jessica [place], MA, RCEP, CCRP, Marylynn Pearson, MS, ASCM CEP, Exercise Physiologist    Virtual Visit No    Medication changes reported     Yes    Comments on antibiotic post surgical procedure    Fall or balance concerns reported    No    Warm-up and Cool-down Performed on first and last piece of equipment    Resistance Training Performed Yes    VAD Patient? No    PAD/SET Patient? No      Pain Assessment   Currently in Pain? No/denies                Social History   Tobacco Use  Smoking Status Never  Smokeless Tobacco Never    Goals Met:  Independence with exercise equipment Exercise tolerated well No report of concerns or symptoms today Strength training completed today  Goals Unmet:  Not Applicable  Comments: Pt able to follow exercise prescription today without complaint.  Will continue to monitor for progression.  Patient confirmed no restrictions in place besides no lifting. Advised patient to refrain from using arms and no hand weights. Limit ROM.   Dr. Roselee Filbert is Medical Director for [place].  Dr. Ottie Glazier is Medical Director for [facility] Pulmonary Rehabilitation.

## 2021-07-05 ENCOUNTER — Other Ambulatory Visit: Payer: Self-pay

## 2021-07-05 ENCOUNTER — Ambulatory Visit (INDEPENDENT_AMBULATORY_CARE_PROVIDER_SITE_OTHER): Payer: BC Managed Care – PPO | Admitting: Plastic Surgery

## 2021-07-05 DIAGNOSIS — C773 Secondary and unspecified malignant neoplasm of axilla and upper limb lymph nodes: Secondary | ICD-10-CM

## 2021-07-05 DIAGNOSIS — C50911 Malignant neoplasm of unspecified site of right female breast: Secondary | ICD-10-CM

## 2021-07-05 NOTE — Progress Notes (Signed)
Patient is here postop from left breast reduction and exchange of right breast tissue expander for gel implant.  She is overall happy with how things are coming along.  On exam everything looks to be healing well.  Incisions are intact.  Left nipple areolar complex is viable.  Small amount of bruising and swelling on the left side which would be typical for this point in the healing process.  Her symmetry is much better than before and I suspect that as the swelling goes down and as the right implant settles will even improve further.  We will plan to see her again in a couple weeks.  All of her questions were answered.

## 2021-07-06 ENCOUNTER — Other Ambulatory Visit: Payer: Self-pay

## 2021-07-06 DIAGNOSIS — C50511 Malignant neoplasm of lower-outer quadrant of right female breast: Secondary | ICD-10-CM

## 2021-07-06 NOTE — Progress Notes (Signed)
Daily Session Note  Patient Details  Name: Heather Bell MRN: [id number] Date of Birth: April 02, 1976 Referring Provider:   April Manson Cancer Associated Rehabilitation & Exercise from 02/14/2021 in [facility] Cardiac and Pulmonary Rehab  Referring Provider [name]       Encounter Date: 07/06/2021  Check In:  Session Check In - 07/06/21 1300       Check-In   Supervising physician immediately available to respond to emergencies See telemetry face sheet for immediately available ER MD    Location [facility]-Cardiac & Pulmonary Rehab    Staff Present Coralie Keens, MS, ASCM CEP, Exercise Physiologist;Amanda Oletta Darter, BA, ACSM CEP, Exercise Physiologist    Medication changes reported     No    Fall or balance concerns reported    No    Resistance Training Performed Yes   yoga   VAD Patient? No    PAD/SET Patient? No                Social History   Tobacco Use  Smoking Status Never  Smokeless Tobacco Never    Goals Met:  Proper associated with RPD/PD & O2 Sat Exercise tolerated well No report of concerns or symptoms today  Goals Unmet:  Not Applicable  Comments: Pt able to follow exercise prescription today without complaint.  Will continue to monitor for progression.   Yoga completed today by Exercise Physiologist, Nada Maclachlan.   Dr. Kahmya Filbert is Medical Director for [place].  Dr. Ottie Glazier is Medical Director for [facility] Pulmonary Rehabilitation.

## 2021-07-18 ENCOUNTER — Other Ambulatory Visit: Payer: Self-pay

## 2021-07-18 DIAGNOSIS — Z171 Estrogen receptor negative status [ER-]: Secondary | ICD-10-CM

## 2021-07-18 NOTE — Progress Notes (Signed)
Discharge Progress Report  Patient Details  Name: Heather Bell MRN: [id number] Date of Birth: [birth year] Referring Provider:   April Manson Cancer Associated Rehabilitation & Exercise from 02/14/2021 in [facility] Cardiac and Pulmonary Rehab  Referring Provider [name]        Number of Visits: 35  Reason for Discharge:  Patient reached a stable level of exercise. Patient independent in their exercise. Patient has met program and personal goals.    Diagnosis:  Malignant neoplasm of lower-outer quadrant of right breast of female, estrogen receptor negative ([place])  ADL UCSD:   Initial Exercise Prescription:  Initial Exercise Prescription - 02/14/21 1300       Date of Initial Exercise RX and Referring Provider   Date 02/14/21    Referring Provider [name]      Treadmill   MPH 2.1    Grade 0.5    Minutes 15    METs 2.75      Recumbant Bike   Level 2    RPM 60    Watts 30    Minutes 15    METs 3      NuStep   Level 2    SPM 80    Minutes 15    METs 3      REL-XR   Level 1    Speed 50    Minutes 15    METs 3      Prescription Details   Frequency (times per week) 2    Duration Progress to 30 minutes of continuous aerobic without signs/symptoms of physical distress      Intensity   THRR 40-80% of Max Heartrate 129-158    Ratings of Perceived Exertion 11-13    Perceived Dyspnea 0-4      Progression   Progression Continue to progress workloads to maintain intensity without signs/symptoms of physical distress.      Resistance Training   Training Prescription Yes    Weight 4 lb    Reps 10-15             Discharge Exercise Prescription (Final Exercise Prescription Changes):  Exercise Prescription Changes - 06/20/21 1400       Response to Exercise   Blood Pressure (Admit) 114/60    Blood Pressure (Exercise) 98/62    Blood Pressure (Exit) 92/62    Heart Rate (Admit) 101 bpm    Heart Rate (Exercise) 122 bpm    Heart Rate  (Exit) 95 bpm    Oxygen Saturation (Admit) 97 %    Oxygen Saturation (Exercise) 97 %    Oxygen Saturation (Exit) 97 %    Rating of Perceived Exertion (Exercise) 12    Perceived Dyspnea (Exercise) 0    Symptoms none    Comments post walk test results    Duration Continue with 30 min of aerobic exercise without signs/symptoms of physical distress.    Intensity THRR unchanged      Progression   Progression Continue to progress workloads to maintain intensity without signs/symptoms of physical distress.    Average METs 4.27      Resistance Training   Training Prescription Yes    Weight 4 lb    Reps 10-15      Interval Training   Interval Training No      Treadmill   MPH 2.6    Grade 5    Minutes 15    METs 4.78      Recumbant Bike   Level 2  Minutes 15    METs 3.1      REL-XR   Level 3    Minutes 15    METs 2.2             Functional Capacity:  6 Minute Walk     Row Name 02/14/21 1353 06/20/21 1409       6 Minute Walk   Phase Initial Discharge    Distance 955 feet 1440 feet    Distance % Change -- 50.7 %    Distance Feet Change -- 485 ft    Walk Time 6 minutes 6 minutes    # of Rest Breaks 0 0    MPH 1.8 2.72    METS 3.55 4.42    RPE 9 12    Perceived Dyspnea  0 0    VO2 Peak 12.44 15.48    Symptoms No No    Resting HR 100 bpm 101 bpm    Resting BP 94/60 114/60    Resting Oxygen Saturation  98 % 97 %    Exercise Oxygen Saturation  during 6 min walk 96 % 97 %    Max Ex. HR 113 bpm 122 bpm    Max Ex. BP 100/62 98/62    2 Minute Post BP 98/62 --              Nutrition & Weight - Outcomes:  Pre Biometrics - 02/14/21 1354       Pre Biometrics   Height 5' 6.25" (1.683 m)    Weight 155 lb 3.2 oz (70.4 kg)    BMI (Calculated) 24.85    Single Leg Stand 30 seconds             Post Biometrics - 06/20/21 1412        Post  Biometrics   Height 5' 6.25" (1.683 m)    Weight 157 lb 12.8 oz (71.6 kg)    BMI (Calculated) 25.27              Goals reviewed with patient; copy given to patient.

## 2021-07-18 NOTE — Progress Notes (Signed)
Daily Session Note  Patient Details  Name: Heather Bell MRN: [id number] Date of Birth: [birth year] Referring Provider:   April Manson Cancer Associated Rehabilitation & Exercise from 02/14/2021 in [facility] Cardiac and Pulmonary Rehab  Referring Provider [name]       Encounter Date: 07/18/2021  Check In:  Session Check In - 07/18/21 1347       Check-In   Supervising physician immediately available to respond to emergencies See telemetry face sheet for immediately available ER MD    Location [facility]-Cardiac & Pulmonary Rehab    Staff Present Birdie Sons, MPA, RN;Jessica Luan [place], MA, RCEP, CCRP, Marylynn Pearson, MS, ASCM CEP, Exercise Physiologist    Virtual Visit No    Medication changes reported     No    Fall or balance concerns reported    No    Warm-up and Cool-down Performed on first and last piece of equipment    Resistance Training Performed Yes    VAD Patient? No    PAD/SET Patient? No                Social History   Tobacco Use  Smoking Status Never  Smokeless Tobacco Never    Goals Met:  Independence with exercise equipment Exercise tolerated well Personal goals reviewed No report of concerns or symptoms today Strength training completed today  Goals Unmet:  Not Applicable  Comments:  [name] graduated today from  rehab with 35 sessions completed.  Details of the patient's exercise prescription and what She needs to do in order to continue the prescription and progress were discussed with patient.  Patient was given a copy of prescription and goals.  Patient verbalized understanding.    Dr. Shahara Filbert is Medical Director for [place].  Dr. Ottie Glazier is Medical Director for [facility] Pulmonary Rehabilitation.

## 2021-07-18 NOTE — Patient Instructions (Signed)
Discharge Patient Instructions  Patient Details  Name: Heather Bell MRN: [id number] Date of Birth: 07/07/1976 Referring Provider:  [place]*   Number of Visits: 47  Reason for Discharge:  Patient reached a stable level of exercise. Patient independent in their exercise. Patient has met program and personal goals.  Smoking History:  Social History   Tobacco Use  Smoking Status Never  Smokeless Tobacco Never    Diagnosis:  Malignant neoplasm of lower-outer quadrant of right breast of female, estrogen receptor negative ([place])  Initial Exercise Prescription:  Initial Exercise Prescription - 02/14/21 1300       Date of Initial Exercise RX and Referring Provider   Date 02/14/21    Referring Provider [name]      Treadmill   MPH 2.1    Grade 0.5    Minutes 15    METs 2.75      Recumbant Bike   Level 2    RPM 60    Watts 30    Minutes 15    METs 3      NuStep   Level 2    SPM 80    Minutes 15    METs 3      REL-XR   Level 1    Speed 50    Minutes 15    METs 3      Prescription Details   Frequency (times per week) 2    Duration Progress to 30 minutes of continuous aerobic without signs/symptoms of physical distress      Intensity   THRR 40-80% of Max Heartrate 129-158    Ratings of Perceived Exertion 11-13    Perceived Dyspnea 0-4      Progression   Progression Continue to progress workloads to maintain intensity without signs/symptoms of physical distress.      Resistance Training   Training Prescription Yes    Weight 4 lb    Reps 10-15             Discharge Exercise Prescription (Final Exercise Prescription Changes):  Exercise Prescription Changes - 06/20/21 1400       Response to Exercise   Blood Pressure (Admit) 114/60    Blood Pressure (Exercise) 98/62    Blood Pressure (Exit) 92/62    Heart Rate (Admit) 101 bpm    Heart Rate (Exercise) 122 bpm    Heart Rate (Exit) 95 bpm    Oxygen Saturation (Admit) 97 %     Oxygen Saturation (Exercise) 97 %    Oxygen Saturation (Exit) 97 %    Rating of Perceived Exertion (Exercise) 12    Perceived Dyspnea (Exercise) 0    Symptoms none    Comments post walk test results    Duration Continue with 30 min of aerobic exercise without signs/symptoms of physical distress.    Intensity THRR unchanged      Progression   Progression Continue to progress workloads to maintain intensity without signs/symptoms of physical distress.    Average METs 4.27      Resistance Training   Training Prescription Yes    Weight 4 lb    Reps 10-15      Interval Training   Interval Training No      Treadmill   MPH 2.6    Grade 5    Minutes 15    METs 4.78      Recumbant Bike   Level 2    Minutes 15    METs 3.1  REL-XR   Level 3    Minutes 15    METs 2.2             Functional Capacity:  6 Minute Walk     Row Name 02/14/21 1353 06/20/21 1409       6 Minute Walk   Phase Initial Discharge    Distance 955 feet 1440 feet    Distance % Change -- 50.7 %    Distance Feet Change -- 485 ft    Walk Time 6 minutes 6 minutes    # of Rest Breaks 0 0    MPH 1.8 2.72    METS 3.55 4.42    RPE 9 12    Perceived Dyspnea  0 0    VO2 Peak 12.44 15.48    Symptoms No No    Resting HR 100 bpm 101 bpm    Resting BP 94/60 114/60    Resting Oxygen Saturation  98 % 97 %    Exercise Oxygen Saturation  during 6 min walk 96 % 97 %    Max Ex. HR 113 bpm 122 bpm    Max Ex. BP 100/62 98/62    2 Minute Post BP 98/62 --                Nutrition & Weight - Outcomes:  Pre Biometrics - 02/14/21 1354       Pre Biometrics   Height 5' 6.25" (1.683 m)    Weight 155 lb 3.2 oz (70.4 kg)    BMI (Calculated) 24.85    Single Leg Stand 30 seconds             Post Biometrics - 06/20/21 1412        Post  Biometrics   Height 5' 6.25" (1.683 m)    Weight 157 lb 12.8 oz (71.6 kg)    BMI (Calculated) 25.27                Goals reviewed with patient;  copy given to patient.

## 2021-07-19 ENCOUNTER — Ambulatory Visit (INDEPENDENT_AMBULATORY_CARE_PROVIDER_SITE_OTHER): Payer: BC Managed Care – PPO | Admitting: Surgical

## 2021-07-19 DIAGNOSIS — S21001D Unspecified open wound of right breast, subsequent encounter: Secondary | ICD-10-CM

## 2021-07-19 DIAGNOSIS — C50911 Malignant neoplasm of unspecified site of right female breast: Secondary | ICD-10-CM

## 2021-07-19 DIAGNOSIS — C773 Secondary and unspecified malignant neoplasm of axilla and upper limb lymph nodes: Secondary | ICD-10-CM

## 2021-07-19 NOTE — Progress Notes (Signed)
Patient is a 45 year old female here for follow-up after left breast reduction and exchange of right breast tissue expander for gel implant with Dr. Claudia Desanctis.  She is 3 weeks postop. She is doing well.  She does not report any infectious symptoms.    Chaperone present on exam Right breast incision is intact, healing very well.  Left NAC is viable, left breast incisions are healing well.  There is no erythema or cellulitic changes of either breast noted.  She does have some volume loss in superior pole of the right breast.  She has good symmetry.  No subcutaneous fluid collections noted.  We discussed continued restrictions and wearing of compressive garments for 3 more weeks. We discussed scheduling a follow-up in the next few months or 6 months to reevaluate and discuss fat grafting and nipple areolar tattooing.  Patient is in agreement with 23-month follow-up.  All of her questions were answered to her content.  Picture was taken and placed in the patient's chart with patient's permission.

## 2021-08-15 ENCOUNTER — Ambulatory Visit
Admission: RE | Admit: 2021-08-15 | Discharge: 2021-08-15 | Disposition: A | Discharge status: Home / Self Care | Payer: BC Managed Care – PPO | Source: Ambulatory Visit | Attending: Internal Medicine | Admitting: Internal Medicine

## 2021-08-15 ENCOUNTER — Other Ambulatory Visit: Payer: Self-pay

## 2021-08-15 DIAGNOSIS — Z9189 Other specified personal risk factors, not elsewhere classified: Secondary | ICD-10-CM | POA: Diagnosis present

## 2021-08-15 MED ORDER — TECHNETIUM TC 99M-LABELED RED BLOOD CELLS IV KIT
20.0000 | PACK | Freq: Once | INTRAVENOUS | Status: AC | PRN
  Administered 2021-08-15: 11:00:00 21.62 via INTRAVENOUS

## 2021-09-07 NOTE — Progress Notes (Signed)
Cardiology Office Note    Date:  09/11/2021   ID:  Heather Bell July 23, 1976, MRN [id number]  PCP:  Heather Bell  Cardiologist:  Nelva Bush, MD  Electrophysiologist:  None   Chief Complaint: Follow up  History of Present Illness:   Heather Bell is a 46 y.o. female with history of breast cancer status post neoadjuvant chemotherapy followed by right total mastectomy with reconstruction, DM2, HLD, normocytic anemia, and depression who presents for follow-up of MUGA scan.  Prior echo in 07/2019 demonstrated an EF of 50 to 55%, global hypokinesis, normal RV systolic function and ventricular cavity size, trivial mitral regurgitation, and an estimated right atrial pressure of 3 mmHg.  Due to suboptimal images, we have been following her EF with serial MUGA scans.  Most recent MUGA scan on 08/16/2021 demonstrated an EF of 53% which was stable when compared to study in 01/2021, though was slightly down when compared to study from 01/2020.  She was last seen in our office in 01/2021 and was doing well from a cardiac perspective without symptoms of angina or decompensation.  Since we last saw her, she underwent removal of right breast tissue expander and replacement of gel implant as well as breast reduction in 06/2021.  She comes in doing well from a cardiac perspective and is without symptoms concerning for angina or decompensation.  No lower extremity swelling, abdominal distention, or orthopnea.  She does note some mild dizziness without presyncope or syncope.  Historically her blood pressure typically runs in the 90s to low 937T systolic.  No falls since she was last seen.  She does try and watch her salt intake.  She does not have any active issues or concerns from a cardiac perspective at this time.   Labs independently reviewed: 06/2021 - HGB 12.0, PLT 321, potassium 3.6, BUN 11, SCr 0.75, albumin 4.4, AST/ALT not elevated 02/2020 - magnesium 1.8 09/2019 - TSH normal  Past  Medical History:  Diagnosis Date   Allergic rhinitis    Anxiety    Bipolar disorder ([place])    depression   Breast cancer ([place]) 03/21/2020   Right mastectomy   Cancer ([place])    breast right   Depression    Diabetes ([place])    GERD (gastroesophageal reflux disease)    Hyperlipidemia    Personal history of chemotherapy    Jan 2021 through July 2021    Past Surgical History:  Procedure Laterality Date   BREAST BIOPSY Right 07/22/2019   mass bx, path pending, heart marker   BREAST BIOPSY Right 07/22/2019   LN bx, path pending,  butterfly hydromarker   BREAST RECONSTRUCTION WITH PLACEMENT OF TISSUE EXPANDER AND FLEX HD (ACELLULAR HYDRATED DERMIS) Right 03/21/2020   Procedure: RIGHT BREAST RECONSTRUCTION WITH PLACEMENT OF TISSUE EXPANDER AND FLEX HD (ACELLULAR HYDRATED DERMIS);  Surgeon: [name], MD;  Location: [facility];  Service: Plastics;  Laterality: Right;   BREAST RECONSTRUCTION WITH PLACEMENT OF TISSUE EXPANDER AND FLEX HD (ACELLULAR HYDRATED DERMIS) Right 12/27/2020   Procedure: BREAST RECONSTRUCTION WITH PLACEMENT OF TISSUE EXPANDER AND FLEX HD (ACELLULAR HYDRATED DERMIS);  Surgeon: [name], MD;  Location: [place];  Service: Plastics;  Laterality: Right;   BREAST REDUCTION SURGERY Left 06/27/2021   Procedure: Left breast reduction;  Surgeon: [name], MD;  Location: [place];  Service: Plastics;  Laterality: Left;   IMAGE GUIDED SINUS SURGERY     INCISION AND DRAINAGE OF WOUND  Right 05/02/2020   Procedure: IRRIGATION AND DEBRIDEMENT WOUND WITH REMOVAL OF RIGHT TISSUE EXPANDER;  Surgeon: [name], MD;  Location: [place];  Service: Plastics;  Laterality: Right;  1 hour please   KNEE SURGERY     MASTECTOMY Right 03/21/2020   NASAL SEPTUM SURGERY     PORTACATH PLACEMENT N/A 08/07/2019   Procedure: INSERTION PORT-A-CATH;  Surgeon: Herbert Pun, MD;  Location: [facility];  Service: General;  Laterality:  N/A;   REMOVAL OF TISSUE EXPANDER AND PLACEMENT OF IMPLANT Right 06/27/2021   Procedure: Removal right breast tissue expander and placement of gel implant;  Surgeon: [name], MD;  Location: [place];  Service: Plastics;  Laterality: Right;   TONSILLECTOMY     TOTAL MASTECTOMY Right 03/21/2020   Procedure: TOTAL MASTECTOMY;  Surgeon: Herbert Pun, MD;  Location: [facility];  Service: General;  Laterality: Right;    Current Medications: Current Meds  Medication Sig   Accu-Chek FastClix Lancets MISC See admin instructions.   ACCU-CHEK GUIDE test strip SMARTSIG:1 Strip(s) Via Meter Daily PRN   amphetamine-dextroamphetamine (ADDERALL XR) 25 MG 24 hr capsule Take 25 mg by mouth daily.   Blood Glucose Monitoring Suppl (ACCU-CHEK GUIDE ME) w/Device KIT USE 1 DEVICE ONCE DAILY AS DIRECTED   buPROPion (WELLBUTRIN XL) 300 MG 24 hr tablet Take 300 mg by mouth every morning.   Calcium Carb-Cholecalciferol (CALCIUM 600+D) 600-800 MG-UNIT TABS Take 1 tablet by mouth daily.   cetirizine (ZYRTEC) 10 MG tablet Take 10 mg by mouth daily.    diazepam (VALIUM) 10 MG tablet Take 10 mg by mouth. As needed prior to procedures   doxepin (SINEQUAN) 25 MG capsule Take 25-50 mg by mouth at bedtime.    ibuprofen (ADVIL) 200 MG tablet Take 400-800 mg by mouth every 6 (six) hours as needed for headache or moderate pain.   lamoTRIgine (LAMICTAL) 150 MG tablet Take 150 mg by mouth daily.   lurasidone (LATUDA) 40 MG TABS tablet Take 40 mg by mouth at bedtime.    Lurasidone HCl 60 MG TABS Take 60 mg by mouth at bedtime.    metFORMIN (GLUCOPHAGE-XR) 500 MG 24 hr tablet Take 500 mg by mouth 2 (two) times daily.   mometasone (NASONEX) 50 MCG/ACT nasal spray Place 2 sprays into the nose daily as needed (allergies).    Multiple Vitamin (MULTIVITAMIN WITH MINERALS) TABS tablet Take 1 tablet by mouth daily.   omeprazole (PRILOSEC) 20 MG capsule Take 20 mg by mouth daily before breakfast.    QUEtiapine  (SEROQUEL) 300 MG tablet Take 300 mg by mouth at bedtime.   simvastatin (ZOCOR) 20 MG tablet Take 20 mg by mouth daily.   topiramate (TOPAMAX) 100 MG tablet Take 50-100 mg by mouth See admin instructions. Take 1 tablet (100 mg) by mouth in the morning & take 0.5 tablet (50 mg) by mouth at night.   vitamin B-12 (CYANOCOBALAMIN) 1000 MCG tablet Take 1,000 mcg by mouth daily.    Allergies:   Compazine [prochlorperazine edisylate], Morphine and related, Famotidine, Doxycycline, Fluticasone, and Levofloxacin   Social History   Socioeconomic History   Marital status: Married    Spouse name: Not on file   Number of children: Not on file   Years of education: Not on file   Highest education level: Not on file  Occupational History   Not on file  Tobacco Use   Smoking status: Never   Smokeless tobacco: Never  Vaping Use   Vaping  Use: Never used  Substance and Sexual Activity   Alcohol use: No    Alcohol/week: 0.0 standard drinks   Drug use: No   Sexual activity: Not on file  Other Topics Concern   Not on file  Social History Narrative   Not on file   Social Determinants of Health   Financial Resource Strain: Not on file  Food Insecurity: Not on file  Transportation Needs: Not on file  Physical Activity: Not on file  Stress: Not on file  Social Connections: Not on file     Family History:  The patient's family history includes Heart attack in her father; Heart disease in her paternal uncle; Lung cancer in her father, paternal aunt, and paternal uncle. There is no history of Breast cancer.  ROS:   12-point review of system is negative as otherwise noted in HPI.   EKGs/Labs/Other Studies Reviewed:    Studies reviewed were summarized above. The additional studies were reviewed today:  2D echo 08/13/2019: 1. Left ventricular ejection fraction, by visual estimation, is 50 to  55%. The left ventricle has normal function. There is no left ventricular  hypertrophy.   2. The  left ventricle demonstrates global hypokinesis.   3. Global right ventricle has normal systolic function.The right  ventricular size is normal. No increase in right ventricular wall  thickness.   4. Left atrial size was normal.   5. Right atrial size was normal.   6. The mitral valve is normal in structure. Trivial mitral valve  regurgitation. No evidence of mitral stenosis.   7. The tricuspid valve is normal in structure.   8. The aortic valve is normal in structure. Aortic valve regurgitation is  not visualized. No evidence of aortic valve sclerosis or stenosis.   9. The pulmonic valve was normal in structure. Pulmonic valve  regurgitation is not visualized.  10. The inferior vena cava is normal in size with greater than 50%  respiratory variability, suggesting right atrial pressure of 3 mmHg. __________  April Manson 08/25/2019: FINDINGS: No  focal wall motion abnormality of the left ventricle. Calculated left ventricular ejection fraction equals 60.2%   IMPRESSION: Left ventricular ejection fraction equals 60%. ___________  April Manson 12/08/2019: FINDINGS: No  focal wall motion abnormality of the left ventricle. Calculated left ventricular ejection fraction equals 57.4%. Comparable to 60% on MUGA scan 08/24/2019.   IMPRESSION: Left ventricular ejection fraction equals  57.4%. __________  April Manson 01/22/2020: FINDINGS: Calculated LEFT ventricular ejection fraction is 56.5%, normal. Study was obtained at a cardiac rate of 90 bpm. Patient was rhythmic during imaging. Cine analysis of the LEFT ventricle in 3 projections demonstrates normal LV wall motion.   IMPRESSION: Normal LEFT ventricular ejection fraction of 56.5%, little changed from the 57.4% on the previous exam.   No focal wall motion abnormalities. __________  April Manson 01/30/2021: FINDINGS: Calculated LEFT ventricular ejection fraction is 52.5%, slightly decreased from the 56.5% on the previous exam. Study was obtained at a  cardiac rate of 96 bpm. Patient was rhythmic during imaging. Cine analysis of the LEFT ventricle in 3 projections demonstrates normal LV wall motion.   IMPRESSION: Slight decrease in LEFT ventricular ejection fraction from 56.5% on prior exam of 01/22/2020 to 52.5% on current exam of 01/30/2021.   Normal LEFT ventricular wall motion. __________  April Manson 08/16/2021: FINDINGS: Normal left ventricular wall motion. No akinetic or dyskinetic segments are identified. The left ventricular ejection fraction is calculated at 53.0 %. This was 52.5% on prior study.   IMPRESSION: Left  ventricular ejection fraction estimated at 53%.    EKG:  EKG is ordered today.  The EKG ordered today demonstrates NSR, 87 bpm, incomplete RBBB, no significant change when compared to prior tracing  Recent Labs: 06/20/2021: ALT 13; BUN 11; Creatinine, Ser 0.75; Hemoglobin 12.0; Platelets 321; Potassium 3.6; Sodium 141  Recent Lipid Panel No results found for: CHOL, TRIG, HDL, CHOLHDL, VLDL, LDLCALC, LDLDIRECT  PHYSICAL EXAM:    VS:  BP 100/70 (BP Location: Left Arm, Patient Position: Sitting, Cuff Size: Normal)    Pulse 87    Ht _0  (1.676 m)    Wt 157 lb (71.2 kg)    SpO2 98%    BMI 25.34 kg/m   BMI: Body mass index is 25.34 kg/m.  Physical Exam Constitutional:      Appearance: She is well-developed.  HENT:     Head: Normocephalic and atraumatic.  Eyes:     General:        Right eye: No discharge.        Left eye: No discharge.  Neck:     Vascular: No JVD.  Cardiovascular:     Rate and Rhythm: Normal rate and regular rhythm.     Pulses:          Posterior tibial pulses are 2+ on the right side and 2+ on the left side.     Heart sounds: Normal heart sounds, S1 normal and S2 normal. Heart sounds not distant. No midsystolic click and no opening snap. No murmur heard.   No friction rub.  Pulmonary:     Effort: Pulmonary effort is normal. No respiratory distress.     Breath sounds: Normal breath  sounds. No decreased breath sounds, wheezing or rales.  Chest:     Chest wall: No tenderness.  Abdominal:     General: There is no distension.     Palpations: Abdomen is soft.     Tenderness: There is no abdominal tenderness.  Musculoskeletal:     Cervical back: Normal range of motion.     Right lower leg: No edema.     Left lower leg: No edema.  Skin:    General: Skin is warm and dry.     Nails: There is no clubbing.  Neurological:     Mental Status: She is alert and oriented to person, place, and time.  Psychiatric:        Speech: Speech normal.        Behavior: Behavior normal.        Thought Content: Thought content normal.        Judgment: Judgment normal.    Wt Readings from Last 3 Encounters:  09/11/21 157 lb (71.2 kg)  06/27/21 158 lb 8.2 oz (71.9 kg)  06/20/21 157 lb 12.8 oz (71.6 kg)     ASSESSMENT & PLAN:   Breast cancer with abnormal echo and at risk for cardiomyopathy: She is doing quite well from a cardiac perspective and is without symptoms of angina or decompensation.  She has completed treatment of her breast cancer with initial concern for possible mildly reduced LVEF by echo in 2020 as outlined above.  Subsequent MUGA scans have demonstrated a low normal LV systolic function, though these have down trended when compared to study in 2021.  Most recent scan from 07/2021 showed a stable EF at 53% as outlined above.  Given that the patient is asymptomatic, and EF remains stable/low normal, and in the context of baseline relative hypotension we will defer  addition of beta-blocker or ARB at this time in an effort to minimize risk of significant hypotension.  We will also defer ischemic testing given lack of anginal symptoms and with stable EF.  We will update an echo in 01/2022.  If she has a downtrending LV systolic function at that time we would need to consider addition of evidence-based heart failure therapy, as BP allows, and consider ischemic testing followed by  consideration for referral to cardio oncology.    Disposition: F/u with Dr. Saunders Revel or an APP in 6 months, following echo.   Medication Adjustments/Labs and Tests Ordered: Current medicines are reviewed at length with the patient today.  Concerns regarding medicines are outlined above. Medication changes, Labs and Tests ordered today are summarized above and listed in the Patient Instructions accessible in Encounters.   Signed, Christell Faith, PA-C 09/11/2021 12:50 PM     [place] [address] [place], [place] [address] [phone]

## 2021-09-11 ENCOUNTER — Encounter: Payer: Self-pay | Admitting: Hematology and Oncology

## 2021-09-11 ENCOUNTER — Encounter: Payer: Self-pay | Admitting: Physician Assistant

## 2021-09-11 ENCOUNTER — Other Ambulatory Visit: Payer: Self-pay

## 2021-09-11 ENCOUNTER — Ambulatory Visit (INDEPENDENT_AMBULATORY_CARE_PROVIDER_SITE_OTHER): Payer: BC Managed Care – PPO | Admitting: Physician Assistant

## 2021-09-11 VITALS — BP 100/70 | HR 87 | Ht 66.0 in | Wt 157.0 lb

## 2021-09-11 DIAGNOSIS — I429 Cardiomyopathy, unspecified: Secondary | ICD-10-CM

## 2021-09-11 NOTE — Patient Instructions (Signed)
Medication Instructions:  No changes at this time.  *If you need a refill on your cardiac medications before your next appointment, please call your pharmacy*   Lab Work: None  If you have labs (blood work) drawn today and your tests are completely normal, you will receive your results only by: [place] (if you have MyChart) OR A paper copy in the mail If you have any lab test that is abnormal or we need to change your treatment, we will call you to review the results.   Testing/Procedures: Your physician has requested that you have an echocardiogram in June.   Echocardiography is a painless test that uses sound waves to create images of your heart. It provides your doctor with information about the size and shape of your heart and how well your hearts chambers and valves are working. This procedure takes approximately one hour. There are no restrictions for this procedure.    Follow-Up: At [facility], you and your health needs are our priority.  As part of our continuing mission to provide you with exceptional heart care, we have created designated Provider Care Teams.  These Care Teams include your primary Cardiologist (physician) and Advanced Practice Providers (APPs -  Physician Assistants and Nurse Practitioners) who all work together to provide you with the care you need, when you need it.   Your next appointment:   6 month(s) AFTER echocardiogram  The format for your next appointment:   In Person  Provider:   Nelva Bush, MD

## 2021-12-19 ENCOUNTER — Encounter: Payer: Self-pay | Admitting: Internal Medicine

## 2021-12-19 ENCOUNTER — Inpatient Hospital Stay (HOSPITAL_BASED_OUTPATIENT_CLINIC_OR_DEPARTMENT_OTHER): Payer: BC Managed Care – PPO | Admitting: Internal Medicine

## 2021-12-19 ENCOUNTER — Inpatient Hospital Stay: Payer: BC Managed Care – PPO | Attending: Internal Medicine

## 2021-12-19 DIAGNOSIS — E538 Deficiency of other specified B group vitamins: Secondary | ICD-10-CM | POA: Insufficient documentation

## 2021-12-19 DIAGNOSIS — C50511 Malignant neoplasm of lower-outer quadrant of right female breast: Secondary | ICD-10-CM | POA: Diagnosis not present

## 2021-12-19 DIAGNOSIS — Z171 Estrogen receptor negative status [ER-]: Secondary | ICD-10-CM

## 2021-12-19 DIAGNOSIS — M542 Cervicalgia: Secondary | ICD-10-CM | POA: Diagnosis not present

## 2021-12-19 DIAGNOSIS — Z803 Family history of malignant neoplasm of breast: Secondary | ICD-10-CM | POA: Diagnosis not present

## 2021-12-19 DIAGNOSIS — Z853 Personal history of malignant neoplasm of breast: Secondary | ICD-10-CM | POA: Insufficient documentation

## 2021-12-19 DIAGNOSIS — E119 Type 2 diabetes mellitus without complications: Secondary | ICD-10-CM | POA: Diagnosis not present

## 2021-12-19 DIAGNOSIS — R5383 Other fatigue: Secondary | ICD-10-CM | POA: Diagnosis not present

## 2021-12-19 DIAGNOSIS — Z801 Family history of malignant neoplasm of trachea, bronchus and lung: Secondary | ICD-10-CM | POA: Diagnosis not present

## 2021-12-19 DIAGNOSIS — D649 Anemia, unspecified: Secondary | ICD-10-CM | POA: Insufficient documentation

## 2021-12-19 LAB — CBC WITH DIFFERENTIAL/PLATELET
Abs Immature Granulocytes: 0.04 10*3/uL (ref 0.00–0.07)
Basophils Absolute: 0 10*3/uL (ref 0.0–0.1)
Basophils Relative: 1 %
Eosinophils Absolute: 0.2 10*3/uL (ref 0.0–0.5)
Eosinophils Relative: 4 %
HCT: 35.6 % — ABNORMAL LOW (ref 36.0–46.0)
Hemoglobin: 11.3 g/dL — ABNORMAL LOW (ref 12.0–15.0)
Immature Granulocytes: 1 %
Lymphocytes Relative: 31 %
Lymphs Abs: 1.9 10*3/uL (ref 0.7–4.0)
MCH: 28.8 pg (ref 26.0–34.0)
MCHC: 31.7 g/dL (ref 30.0–36.0)
MCV: 90.8 fL (ref 80.0–100.0)
Monocytes Absolute: 0.4 10*3/uL (ref 0.1–1.0)
Monocytes Relative: 7 %
Neutro Abs: 3.5 10*3/uL (ref 1.7–7.7)
Neutrophils Relative %: 56 %
Platelets: 297 10*3/uL (ref 150–400)
RBC: 3.92 MIL/uL (ref 3.87–5.11)
RDW: 13.3 % (ref 11.5–15.5)
WBC: 6.1 10*3/uL (ref 4.0–10.5)
nRBC: 0 % (ref 0.0–0.2)

## 2021-12-19 LAB — COMPREHENSIVE METABOLIC PANEL
ALT: 14 U/L (ref 0–44)
AST: 15 U/L (ref 15–41)
Albumin: 3.8 g/dL (ref 3.5–5.0)
Alkaline Phosphatase: 80 U/L (ref 38–126)
Anion gap: 8 (ref 5–15)
BUN: 12 mg/dL (ref 6–20)
CO2: 23 mmol/L (ref 22–32)
Calcium: 9.2 mg/dL (ref 8.9–10.3)
Chloride: 108 mmol/L (ref 98–111)
Creatinine, Ser: 0.84 mg/dL (ref 0.44–1.00)
GFR, Estimated: >60 mL/min (ref >60)
Glucose, Bld: 108 mg/dL — ABNORMAL HIGH (ref 70–99)
Potassium: 3.7 mmol/L (ref 3.5–5.1)
Sodium: 139 mmol/L (ref 135–145)
Total Bilirubin: 0.3 mg/dL (ref 0.3–1.2)
Total Protein: 7.5 g/dL (ref 6.5–8.1)

## 2021-12-19 MED ORDER — AMOXICILLIN-POT CLAVULANATE 875-125 MG PO TABS: 1.0000 | ORAL_TABLET | Freq: Two times a day (BID) | ORAL | 0 refills | Status: DC

## 2021-12-19 NOTE — Progress Notes (Signed)
[place] ?CONSULT NOTE ? ?Patient Care Team: ?[facility] as PCP - General ?End, Harrell Gave, MD as PCP - Cardiology (Cardiology) ? ?CHIEF COMPLAINTS/PURPOSE OF CONSULTATION: Triple negative breast cancer ? ?#  ?Oncology History Overview Note  ?#  Clinical stage IIB triple negative right breast cancer: s/p neoadjuvant chemotherapy followed by right total mastectomy and sentinel lymph node biopsy. ?            Pathology revealed a complete pathologic remission. ?            She is s/p removal of right tissue expander. ?            Clinically, she continues to do well.     ?            Exam reveals no evidence of recurrent disease. ?            Encourage monthly breast self-exam. ?            Left mammogram on 08/09/2020 revealed no evidence of malignancy. ?            She had a port removed by Dr. Su Monks is on 10/06/2020. ?            She is scheduled for breast reconstruction with tissue expanders by Dr. Joellyn Rued ?  ?Carcinoma of lower-outer quadrant of right breast in female, estrogen receptor negative ([place])  ?09/28/2019 Initial Diagnosis  ? Carcinoma of lower-outer quadrant of right breast in female, estrogen receptor negative ([place]) ?  ? ? ? ?HISTORY OF PRESENTING ILLNESS: ALone.  Ambulating independently. ? ?Heather Bell 46 y.o.  female history of triple negative breast cancer stage II currently on surveillance; history of iron deficiency anemia is here for follow-up. ? ?C/o swollen gland left neck x2-3 days. Has noticed some trouble swallowing solid food, and trouble swallowing saliva when laying down.  Patient denies any fevers or chills.  No concern for infections.  History of chronic allergies; year-round Zyrtec. ? ?Otherwise patient denies any blood in stools or black or stools.  Denies any unusual shortness of breath or cough.  Denies any swelling in the legs. ? ?Review of Systems  ?Constitutional:  Positive for malaise/fatigue. Negative for chills, diaphoresis, fever and  weight loss.  ?HENT:  Negative for nosebleeds and sore throat.   ?Eyes:  Negative for double vision.  ?Respiratory:  Negative for cough, hemoptysis, sputum production, shortness of breath and wheezing.   ?Cardiovascular:  Negative for chest pain, palpitations, orthopnea and leg swelling.  ?Gastrointestinal:  Negative for abdominal pain, blood in stool, constipation, diarrhea, heartburn, melena, nausea and vomiting.  ?Genitourinary:  Negative for dysuria, frequency and urgency.  ?Musculoskeletal:  Positive for joint pain.  ?Skin: Negative.  Negative for itching and rash.  ?Neurological:  Negative for dizziness, tingling, focal weakness, weakness and headaches.  ?Endo/Heme/Allergies:  Does not bruise/bleed easily.  ?Psychiatric/Behavioral:  Negative for depression. The patient is not nervous/anxious and does not have insomnia.    ? ?MEDICAL HISTORY:  ?Past Medical History:  ?Diagnosis Date  ? Allergic rhinitis   ? Anxiety   ? Bipolar disorder ([place])   ? depression  ? Breast cancer ([place]) 03/21/2020  ? Right mastectomy  ? Cancer [facility])   ? breast right  ? Depression   ? Diabetes ([place])   ? GERD (gastroesophageal reflux disease)   ? Hyperlipidemia   ? Personal history of chemotherapy   ? Jan 2021 through July 2021  ? ? ?  SURGICAL HISTORY: ?Past Surgical History:  ?Procedure Laterality Date  ? BREAST BIOPSY Right 07/22/2019  ? mass bx, path pending, heart marker  ? BREAST BIOPSY Right 07/22/2019  ? LN bx, path pending,  butterfly hydromarker  ? BREAST RECONSTRUCTION WITH PLACEMENT OF TISSUE EXPANDER AND FLEX HD (ACELLULAR HYDRATED DERMIS) Right 03/21/2020  ? Procedure: RIGHT BREAST RECONSTRUCTION WITH PLACEMENT OF TISSUE EXPANDER AND FLEX HD (ACELLULAR HYDRATED DERMIS);  Surgeon: [name], MD;  Location: [facility];  Service: Plastics;  Laterality: Right;  ? BREAST RECONSTRUCTION WITH PLACEMENT OF TISSUE EXPANDER AND FLEX HD (ACELLULAR HYDRATED DERMIS) Right 12/27/2020  ? Procedure: BREAST RECONSTRUCTION WITH PLACEMENT OF  TISSUE EXPANDER AND FLEX HD (ACELLULAR HYDRATED DERMIS);  Surgeon: [name], MD;  Location: [place];  Service: Plastics;  Laterality: Right;  ? BREAST REDUCTION SURGERY Left 06/27/2021  ? Procedure: Left breast reduction;  Surgeon: [name], MD;  Location: [place];  Service: Plastics;  Laterality: Left;  ? IMAGE GUIDED SINUS SURGERY    ? INCISION AND DRAINAGE OF WOUND Right 05/02/2020  ? Procedure: IRRIGATION AND DEBRIDEMENT WOUND WITH REMOVAL OF RIGHT TISSUE EXPANDER;  Surgeon: [name], MD;  Location: [place];  Service: Plastics;  Laterality: Right;  1 hour please  ? KNEE SURGERY    ? MASTECTOMY Right 03/21/2020  ? NASAL SEPTUM SURGERY    ? PORTACATH PLACEMENT N/A 08/07/2019  ? Procedure: INSERTION PORT-A-CATH;  Surgeon: Herbert Pun, MD;  Location: [facility];  Service: General;  Laterality: N/A;  ? REMOVAL OF TISSUE EXPANDER AND PLACEMENT OF IMPLANT Right 06/27/2021  ? Procedure: Removal right breast tissue expander and placement of gel implant;  Surgeon: [name], MD;  Location: [place];  Service: Plastics;  Laterality: Right;  ? TONSILLECTOMY    ? TOTAL MASTECTOMY Right 03/21/2020  ? Procedure: TOTAL MASTECTOMY;  Surgeon: Herbert Pun, MD;  Location: [facility];  Service: General;  Laterality: Right;  ? ? ?SOCIAL HISTORY: ?Social History  ? ?Socioeconomic History  ? Marital status: Married  ?  Spouse name: Not on file  ? Number of children: Not on file  ? Years of education: Not on file  ? Highest education level: Not on file  ?Occupational History  ? Not on file  ?Tobacco Use  ? Smoking status: Never  ? Smokeless tobacco: Never  ?Vaping Use  ? Vaping Use: Never used  ?Substance and Sexual Activity  ? Alcohol use: No  ?  Alcohol/week: 0.0 standard drinks  ? Drug use: No  ? Sexual activity: Not on file  ?Other Topics Concern  ? Not on file  ?Social History Narrative  ? Not on file  ? ?Social Determinants  of Health  ? ?Financial Resource Strain: Not on file  ?Food Insecurity: Not on file  ?Transportation Needs: Not on file  ?Physical Activity: Not on file  ?Stress: Not on file  ?Social Connections: Not on file  ?Intimate Partner Violence: Not on file  ? ? ?FAMILY HISTORY: ?Family History  ?Problem Relation Age of Onset  ? Lung cancer Father   ? Heart attack Father   ? Lung cancer Paternal Aunt   ? Lung cancer Paternal Uncle   ? Heart disease Paternal Uncle   ? Breast cancer Neg Hx   ? ? ?ALLERGIES:  is allergic to compazine [prochlorperazine edisylate], morphine and related, famotidine, doxycycline, fluticasone, and levofloxacin. ? ?MEDICATIONS:  ?Current Outpatient Medications  ?Medication Sig Dispense Refill  ?  Accu-Chek FastClix Lancets MISC See admin instructions.    ? ACCU-CHEK GUIDE test strip SMARTSIG:1 Strip(s) Via Meter Daily PRN    ? amoxicillin-clavulanate (AUGMENTIN) 875-125 MG tablet Take 1 tablet by mouth 2 (two) times daily. 10 tablet 0  ? amphetamine-dextroamphetamine (ADDERALL XR) 25 MG 24 hr capsule Take 25 mg by mouth daily.    ? Blood Glucose Monitoring Suppl (ACCU-CHEK GUIDE ME) w/Device KIT USE 1 DEVICE ONCE DAILY AS DIRECTED    ? buPROPion (WELLBUTRIN XL) 300 MG 24 hr tablet Take 300 mg by mouth every morning.    ? Calcium Carb-Cholecalciferol (CALCIUM 600+D) 600-800 MG-UNIT TABS Take 1 tablet by mouth daily.    ? cetirizine (ZYRTEC) 10 MG tablet Take 10 mg by mouth daily.     ? diazepam (VALIUM) 10 MG tablet Take 10 mg by mouth. As needed prior to procedures    ? doxepin (SINEQUAN) 25 MG capsule Take 25-50 mg by mouth at bedtime.     ? ibuprofen (ADVIL) 200 MG tablet Take 400-800 mg by mouth every 6 (six) hours as needed for headache or moderate pain.    ? lamoTRIgine (LAMICTAL) 150 MG tablet Take 150 mg by mouth daily.    ? lurasidone (LATUDA) 40 MG TABS tablet Take 40 mg by mouth at bedtime.     ? Lurasidone HCl 60 MG TABS Take 60 mg by mouth at bedtime.     ? metFORMIN (GLUCOPHAGE-XR) 500  MG 24 hr tablet Take 500 mg by mouth 2 (two) times daily.    ? mometasone (NASONEX) 50 MCG/ACT nasal spray Place 2 sprays into the nose daily as needed (allergies).     ? Multiple Vitamin (MULTIVITAMIN WIT

## 2021-12-19 NOTE — Progress Notes (Signed)
C/o swollen gland left neck x2-3 days. ? ?Has noticed some trouble swallowing solid food, and trouble swallowing saliva when laying down. ? ?

## 2021-12-19 NOTE — Assessment & Plan Note (Signed)
#   DEc 2020-?Clinical stage?IIB triple negative?right breast cancer: s/p neoadjuvant chemotherapy followed by right total mastectomy and sentinel lymph node biopsy-pathology complete pathologic response.  STABLE.  ? ?# Clinically no evidence of recurrence.  December 2021 mammogram left side normal.  Reconstruction-right breast May 2022. STABLE.  Ordered LEFT mammogram. ? ?# DROp in EF- MUGA scan -Slight decrease in LEFT ventricular ejection fraction from 56.5% on prior exam dec 2022- 53% .STABLE.  ? ?# Left neck LN swelling/pain-  X 2 days; recommend augmentin  BID x5 days.  If not improved will recommend CT scan.  Patient will call sooner if not improved. ? ?#  Normocytic anemia: S/p IV iron infusion- [feb 0383] ? OCT 2022- Hb11- 12.- STABLE; monitor for now. ???????????? ?#?B12 deficiency: B12 - high; on p.o. B12; recommend PO QD ? ?# Fatigue: continue care program-stable. ? ?# Genetics-discussed- previous testing done; reviewed the expanded testing done; negative for any deleterious mutations. ? ?# BMD- none; discussed- will order with mammogram ? ?# DISPOSITION: ?# Left Mammogram in dec 2022/bone density ?#  26month MD; no labs- Dr.B ?? ?

## 2021-12-20 LAB — CANCER ANTIGEN 27.29: CA 27.29: <9 U/mL (ref 0.0–38.6)

## 2022-01-11 ENCOUNTER — Ambulatory Visit (INDEPENDENT_AMBULATORY_CARE_PROVIDER_SITE_OTHER): Payer: BC Managed Care – PPO | Admitting: Surgical

## 2022-01-11 ENCOUNTER — Other Ambulatory Visit: Payer: BC Managed Care – PPO

## 2022-01-11 DIAGNOSIS — Z9011 Acquired absence of right breast and nipple: Secondary | ICD-10-CM

## 2022-01-11 DIAGNOSIS — C773 Secondary and unspecified malignant neoplasm of axilla and upper limb lymph nodes: Secondary | ICD-10-CM | POA: Diagnosis not present

## 2022-01-11 DIAGNOSIS — C50911 Malignant neoplasm of unspecified site of right female breast: Secondary | ICD-10-CM

## 2022-01-11 NOTE — Progress Notes (Signed)
   Referring Provider [facility] [address], [place],  [place] [address]   CC:  Chief Complaint  Patient presents with   Follow-up      Heather Bell is an 46 y.o. female.  HPI: Patient is a 46 year old female here for follow-up on her right breast reconstruction.  She underwent left breast reduction and exchange of right breast tissue expander for gel implant with Dr. Claudia Desanctis on 06/27/2021.  She reports overall she is doing really well, she is not having any issues.  She is very pleased with the size and symmetry.  She has some questions about signs to look for complications.  She reports that she thought she was going to have saline implant placed, but she is not sure. I did review EMR notes, I do not see any discussion about this.  Patient reports that she may have been confused.  Review of Systems General: No fevers or chills  Physical Exam    12/19/2021   10:17 AM 09/11/2021   11:21 AM 06/27/2021   12:20 PM  Vitals with BMI  Height '5\' 6"'$  '5\' 6"'$    Weight 158 lbs 13 oz 157 lbs   BMI 67.59 16.38   Systolic 99 466 599  Diastolic 65 70 73  Pulse 90 87 87    General:  No acute distress,  Alert and oriented, Non-Toxic, Normal speech and affect Breast: Left breast NAC is viable, incisions intact, well-healed.  Right breast incision is intact, nipple surgically absent.  Right breast capsule is soft, patient has good symmetry of bilateral breast.  No erythema or cellulitic changes.  No subcutaneous fluid collections noted.  Assessment/Plan Discussed with patient that imaging is typically recommended about 3 years after placement of implants for routine evaluation.  We discussed reasons to evaluate sooner such as injury or changes in her breast shape, firmness of the breast.  I do not see any signs of concern on exam today.  We discussed that I am not sure if there was some confusion with the exchange, but there are no notes that I was able to see stating her request  for saline implants.  She stated that she may have been confused about which implants she requested.   Recommend following up in 1 year for reevaluation.  Recommend calling with questions or concerns.  Heather Bell Heather Bell 01/11/2022, 9:37 AM

## 2022-01-19 ENCOUNTER — Inpatient Hospital Stay: Payer: BC Managed Care – PPO

## 2022-01-19 ENCOUNTER — Inpatient Hospital Stay: Payer: BC Managed Care – PPO | Attending: Internal Medicine | Admitting: Internal Medicine

## 2022-01-19 ENCOUNTER — Encounter: Payer: Self-pay | Admitting: Internal Medicine

## 2022-01-19 DIAGNOSIS — Z171 Estrogen receptor negative status [ER-]: Secondary | ICD-10-CM

## 2022-01-19 DIAGNOSIS — Z801 Family history of malignant neoplasm of trachea, bronchus and lung: Secondary | ICD-10-CM | POA: Diagnosis not present

## 2022-01-19 DIAGNOSIS — R5383 Other fatigue: Secondary | ICD-10-CM | POA: Insufficient documentation

## 2022-01-19 DIAGNOSIS — D649 Anemia, unspecified: Secondary | ICD-10-CM | POA: Insufficient documentation

## 2022-01-19 DIAGNOSIS — Z853 Personal history of malignant neoplasm of breast: Secondary | ICD-10-CM | POA: Diagnosis not present

## 2022-01-19 DIAGNOSIS — E119 Type 2 diabetes mellitus without complications: Secondary | ICD-10-CM | POA: Diagnosis not present

## 2022-01-19 DIAGNOSIS — C50511 Malignant neoplasm of lower-outer quadrant of right female breast: Secondary | ICD-10-CM | POA: Diagnosis not present

## 2022-01-19 DIAGNOSIS — E538 Deficiency of other specified B group vitamins: Secondary | ICD-10-CM | POA: Insufficient documentation

## 2022-01-19 DIAGNOSIS — F418 Other specified anxiety disorders: Secondary | ICD-10-CM | POA: Diagnosis not present

## 2022-01-19 NOTE — Assessment & Plan Note (Addendum)
#   DEc 2020-Clinical stageIIB triple negativeright breast cancer: s/p neoadjuvant chemotherapy followed by right total mastectomy and sentinel lymph node biopsy-pathology complete pathologic response.  STABLE.   # Clinically no evidence of recurrence.  December 2021 mammogram left side normal.  Reconstruction-right breast May 2022.  Ordered LEFT mammogram-pending.   #Vaginal dryness/dyspareunia/lack of sexual drive-multifactorial-antidepressants plus-postchemotherapy menopause/vaginal dryness.  Recommend water-based lubricants prior to intercourse.  If not improved-consider topical estrogen base therapies [however not first preference].  Will refer to gynecology.  Also recommend talking to her psychiatrist regarding medication side effects interfering with her sexual drive.  Will refer to gynecology/[facility].  #  Normocytic anemia: S/p IV iron infusion- [feb 8889] ? OCT 2022- Hb11- 12.- STABLE; monitor for now.  #Depression/anxiety-multiple psychiatric medication [Dr.Su; [facility]]; clinically stable.  See above   #B12 deficiency: B12 - high; on p.o. B12; recommend PO QD  # Fatigue: continue care program-stable.  # BMD- none; discussed; awaiting with mammogram  # DISPOSITION: # refer to [place] side gynecology re: vaginal dryness # follow up in 6 months- MD; labs- cbc/cmp;ca-27-29-dr.B

## 2022-01-19 NOTE — Progress Notes (Signed)
[name]  Patient Care Team: Tangelo Park as PCP - General End, Harrell Gave, MD as PCP - Cardiology (Cardiology) Cammie Sickle, MD as Consulting Physician (Oncology)  CHIEF COMPLAINTS/PURPOSE OF CONSULTATION: Triple negative breast cancer  #  Oncology History Overview Note  #  Clinical stage IIB triple negative right breast cancer: s/p neoadjuvant chemotherapy followed by right total mastectomy and sentinel lymph node biopsy.             Pathology revealed a complete pathologic remission.             She is s/p removal of right tissue expander.             Clinically, she continues to do well.                 Exam reveals no evidence of recurrent disease.             Encourage monthly breast self-exam.             Left mammogram on 08/09/2020 revealed no evidence of malignancy.             She had a port removed by Dr. Su Monks is on 10/06/2020.              # She underwent left breast reduction and exchange of right breast tissue expander for gel implant with Dr. Claudia Desanctis on 06/27/2021.    Carcinoma of lower-outer quadrant of right breast in female, estrogen receptor negative ([place])  09/28/2019 Initial Diagnosis   Carcinoma of lower-outer quadrant of right breast in female, estrogen receptor negative ([place])      HISTORY OF PRESENTING ILLNESS: ALone.  Ambulating independently.  Heather Bell 46 y.o.  female history of triple negative breast cancer stage II currently on surveillance; history of iron deficiency anemia is here for follow-up.  Since finishing chemotherapy she has a decline in sex drive and vaginal dryness.  Patient started on antibiotics at last visit for left-sided lymph node swelling resolved post antibiotics.  Otherwise patient denies any blood in stools or black or stools.  Denies any unusual shortness of breath or cough.  Denies any swelling in the legs.  Review of Systems  Constitutional:  Positive for  malaise/fatigue. Negative for chills, diaphoresis, fever and weight loss.  HENT:  Negative for nosebleeds and sore throat.   Eyes:  Negative for double vision.  Respiratory:  Negative for cough, hemoptysis, sputum production, shortness of breath and wheezing.   Cardiovascular:  Negative for chest pain, palpitations, orthopnea and leg swelling.  Gastrointestinal:  Negative for abdominal pain, blood in stool, constipation, diarrhea, heartburn, melena, nausea and vomiting.  Genitourinary:  Negative for dysuria, frequency and urgency.  Musculoskeletal:  Positive for joint pain.  Skin: Negative.  Negative for itching and rash.  Neurological:  Negative for dizziness, tingling, focal weakness, weakness and headaches.  Endo/Heme/Allergies:  Does not bruise/bleed easily.  Psychiatric/Behavioral:  Negative for depression. The patient is not nervous/anxious and does not have insomnia.     MEDICAL HISTORY:  Past Medical History:  Diagnosis Date   Allergic rhinitis    Anxiety    Bipolar disorder ([place])    depression   Breast cancer ([place]) 03/21/2020   Right mastectomy   Cancer ([place])    breast right   Depression    Diabetes ([place])    GERD (gastroesophageal reflux disease)    Hyperlipidemia    Personal history of chemotherapy  Jan 2021 through July 2021    SURGICAL HISTORY: Past Surgical History:  Procedure Laterality Date   BREAST BIOPSY Right 07/22/2019   mass bx, path pending, heart marker   BREAST BIOPSY Right 07/22/2019   LN bx, path pending,  butterfly hydromarker   BREAST RECONSTRUCTION WITH PLACEMENT OF TISSUE EXPANDER AND FLEX HD (ACELLULAR HYDRATED DERMIS) Right 03/21/2020   Procedure: RIGHT BREAST RECONSTRUCTION WITH PLACEMENT OF TISSUE EXPANDER AND FLEX HD (ACELLULAR HYDRATED DERMIS);  Surgeon: [name], MD;  Location: [facility];  Service: Plastics;  Laterality: Right;   BREAST RECONSTRUCTION WITH PLACEMENT OF TISSUE EXPANDER AND FLEX HD (ACELLULAR HYDRATED DERMIS) Right  12/27/2020   Procedure: BREAST RECONSTRUCTION WITH PLACEMENT OF TISSUE EXPANDER AND FLEX HD (ACELLULAR HYDRATED DERMIS);  Surgeon: [name], MD;  Location: [facility];  Service: Plastics;  Laterality: Right;   BREAST REDUCTION SURGERY Left 06/27/2021   Procedure: Left breast reduction;  Surgeon: [name], MD;  Location: [facility];  Service: Plastics;  Laterality: Left;   IMAGE GUIDED SINUS SURGERY     INCISION AND DRAINAGE OF WOUND Right 05/02/2020   Procedure: IRRIGATION AND DEBRIDEMENT WOUND WITH REMOVAL OF RIGHT TISSUE EXPANDER;  Surgeon: [name], MD;  Location: [facility];  Service: Plastics;  Laterality: Right;  1 hour please   KNEE SURGERY     MASTECTOMY Right 03/21/2020   NASAL SEPTUM SURGERY     PORTACATH PLACEMENT N/A 08/07/2019   Procedure: INSERTION PORT-A-CATH;  Surgeon: Herbert Pun, MD;  Location: [facility];  Service: General;  Laterality: N/A;   REMOVAL OF TISSUE EXPANDER AND PLACEMENT OF IMPLANT Right 06/27/2021   Procedure: Removal right breast tissue expander and placement of gel implant;  Surgeon: [name], MD;  Location: [facility];  Service: Plastics;  Laterality: Right;   TONSILLECTOMY     TOTAL MASTECTOMY Right 03/21/2020   Procedure: TOTAL MASTECTOMY;  Surgeon: Herbert Pun, MD;  Location: [facility];  Service: General;  Laterality: Right;    SOCIAL HISTORY: Social History   Socioeconomic History   Marital status: Married    Spouse name: Not on file   Number of children: Not on file   Years of education: Not on file   Highest education level: Not on file  Occupational History   Not on file  Tobacco Use   Smoking status: Never   Smokeless tobacco: Never  Vaping Use   Vaping Use: Never used  Substance and Sexual Activity   Alcohol use: No    Alcohol/week: 0.0 standard drinks   Drug use: No   Sexual activity: Not on file  Other Topics Concern   Not on file   Social History Narrative   Not on file   Social Determinants of Health   Financial Resource Strain: Not on file  Food Insecurity: Not on file  Transportation Needs: No Transportation Needs   Lack of Transportation (Medical): No   Lack of Transportation (Non-Medical): No  Physical Activity: Not on file  Stress: Not on file  Social Connections: Not on file  Intimate Partner Violence: Not on file    FAMILY HISTORY: Family History  Problem Relation Age of Onset   Lung cancer Father    Heart attack Father    Lung cancer Paternal Aunt    Lung cancer Paternal Uncle    Heart disease Paternal Uncle    Breast cancer Neg Hx     ALLERGIES:  is allergic to  compazine [prochlorperazine edisylate], morphine and related, famotidine, doxycycline, fluticasone, and levofloxacin.  MEDICATIONS:  Current Outpatient Medications  Medication Sig Dispense Refill   Accu-Chek FastClix Lancets MISC See admin instructions.     ACCU-CHEK GUIDE test strip SMARTSIG:1 Strip(s) Via Meter Daily PRN     amphetamine-dextroamphetamine (ADDERALL XR) 25 MG 24 hr capsule Take 25 mg by mouth daily.     Blood Glucose Monitoring Suppl (ACCU-CHEK GUIDE ME) w/Device KIT USE 1 DEVICE ONCE DAILY AS DIRECTED     buPROPion (WELLBUTRIN XL) 300 MG 24 hr tablet Take 300 mg by mouth every morning.     Calcium Carb-Cholecalciferol (CALCIUM 600+D) 600-800 MG-UNIT TABS Take 1 tablet by mouth daily.     cetirizine (ZYRTEC) 10 MG tablet Take 10 mg by mouth daily.      diazepam (VALIUM) 10 MG tablet Take 10 mg by mouth. As needed prior to procedures     doxepin (SINEQUAN) 25 MG capsule Take 25-50 mg by mouth at bedtime.      ibuprofen (ADVIL) 200 MG tablet Take 400-800 mg by mouth every 6 (six) hours as needed for headache or moderate pain.     lamoTRIgine (LAMICTAL) 150 MG tablet Take 150 mg by mouth daily.     lurasidone (LATUDA) 40 MG TABS tablet Take 40 mg by mouth at bedtime.      Lurasidone HCl 60 MG TABS Take 60 mg by mouth  at bedtime.      metFORMIN (GLUCOPHAGE-XR) 500 MG 24 hr tablet Take 500 mg by mouth 2 (two) times daily.     mometasone (NASONEX) 50 MCG/ACT nasal spray Place 2 sprays into the nose daily as needed (allergies).      Multiple Vitamin (MULTIVITAMIN WITH MINERALS) TABS tablet Take 1 tablet by mouth daily.     omeprazole (PRILOSEC) 20 MG capsule Take 20 mg by mouth daily before breakfast.      QUEtiapine (SEROQUEL) 300 MG tablet Take 300 mg by mouth at bedtime.     simvastatin (ZOCOR) 20 MG tablet Take 20 mg by mouth daily.     topiramate (TOPAMAX) 100 MG tablet Take 50-100 mg by mouth See admin instructions. Take 1 tablet (100 mg) by mouth in the morning & take 0.5 tablet (50 mg) by mouth at night.     vitamin B-12 (CYANOCOBALAMIN) 1000 MCG tablet Take 1,000 mcg by mouth daily.     No current facility-administered medications for this visit.   Facility-Administered Medications Ordered in Other Visits  Medication Dose Route Frequency Provider Last Rate Last Admin   0.9 %  sodium chloride infusion   Intravenous Continuous Lequita Asal, MD   Stopped at 01/08/20 1516   sodium chloride flush (NS) 0.9 % injection 10 mL  10 mL Intravenous PRN Nolon Stalls C, MD   10 mL at 01/08/20 1314      .  PHYSICAL EXAMINATION: ECOG PERFORMANCE STATUS: 0 - Asymptomatic  Vitals:   01/19/22 1000  BP: 100/65  Pulse: 97  Temp: 99.1 F (37.3 C)   Filed Weights   01/19/22 1000  Weight: 156 lb (70.8 kg)    Physical Exam Vitals and nursing note reviewed.  Constitutional:      Comments:       HENT:     Head: Normocephalic and atraumatic.     Mouth/Throat:     Pharynx: Oropharynx is clear.  Eyes:     Extraocular Movements: Extraocular movements intact.     Pupils: Pupils are equal, round, and reactive  to light.  Cardiovascular:     Rate and Rhythm: Normal rate and regular rhythm.  Pulmonary:     Comments: Decreased breath sounds bilaterally.  Abdominal:     Palpations: Abdomen is  soft.  Musculoskeletal:        General: Normal range of motion.     Cervical back: Normal range of motion.  Skin:    General: Skin is warm.  Neurological:     General: No focal deficit present.     Mental Status: She is alert and oriented to person, place, and time.  Psychiatric:        Behavior: Behavior normal.        Judgment: Judgment normal.    LABORATORY DATA:  I have reviewed the data as listed Lab Results  Component Value Date   WBC 6.1 12/19/2021   HGB 11.3 (L) 12/19/2021   HCT 35.6 (L) 12/19/2021   MCV 90.8 12/19/2021   PLT 297 12/19/2021   Recent Labs    03/21/21 0827 06/20/21 0952 12/19/21 1009  NA 140 141 139  K 3.3* 3.6 3.7  CL 109 106 108  CO2 '25 24 23  ' GLUCOSE 127* 132* 108*  BUN '13 11 12  ' CREATININE 0.87 0.75 0.84  CALCIUM 9.3 9.5 9.2  GFRNONAA >60 >60 >60  PROT 7.4 7.7 7.5  ALBUMIN 4.1 4.4 3.8  AST 16 14* 15  ALT '13 13 14  ' ALKPHOS 76 89 80  BILITOT 0.4 0.3 0.3    RADIOGRAPHIC STUDIES: I have personally reviewed the radiological images as listed and agreed with the findings in the report. No results found.  ASSESSMENT & PLAN:   Carcinoma of lower-outer quadrant of right breast in female, estrogen receptor negative ([place]) # DEc 2020- Clinical stage IIB triple negative right breast cancer: s/p neoadjuvant chemotherapy followed by right total mastectomy and sentinel lymph node biopsy-pathology complete pathologic response.  STABLE.   # Clinically no evidence of recurrence.  December 2021 mammogram left side normal.  Reconstruction-right breast May 2022.  Ordered LEFT mammogram-pending.   #Vaginal dryness/dyspareunia/lack of sexual drive-multifactorial-antidepressants plus-postchemotherapy menopause/vaginal dryness.  Recommend water-based lubricants prior to intercourse.  If not improved-consider topical estrogen base therapies [however not first preference].  Will refer to gynecology.  Also recommend talking to her psychiatrist regarding  medication side effects interfering with her sexual drive.  Will refer to gynecology/[facility].  #  Normocytic anemia: S/p IV iron infusion- [feb 4239] ? OCT 2022- Hb11- 12.- STABLE; monitor for now.  #Depression/anxiety-multiple psychiatric medication [Dr.Su; [facility]]; clinically stable.  See above              # B12 deficiency: B12 - high; on p.o. B12; recommend PO QD  # Fatigue: continue care program-stable.  # BMD- none; discussed; awaiting with mammogram  # DISPOSITION: # refer to [place] [facility] gynecology re: vaginal dryness # follow up in 6 months- MD; labs- cbc/cmp;ca-27-29-dr.B   All questions were answered. The patient knows to call the clinic with any problems, questions or concerns.    Cammie Sickle, MD 01/19/2022 12:55 PM

## 2022-01-19 NOTE — Progress Notes (Signed)
Since finishing chemotherapy she has a decline in sex drive and vaginal dryness.

## 2022-01-30 ENCOUNTER — Ambulatory Visit
Admission: RE | Admit: 2022-01-30 | Discharge: 2022-01-30 | Disposition: A | Discharge status: Home / Self Care | Payer: BC Managed Care – PPO | Source: Ambulatory Visit | Attending: Internal Medicine | Admitting: Internal Medicine

## 2022-01-30 DIAGNOSIS — C50511 Malignant neoplasm of lower-outer quadrant of right female breast: Secondary | ICD-10-CM | POA: Insufficient documentation

## 2022-01-30 DIAGNOSIS — Z171 Estrogen receptor negative status [ER-]: Secondary | ICD-10-CM

## 2022-02-13 ENCOUNTER — Encounter: Payer: Self-pay | Admitting: Obstetrics

## 2022-02-13 ENCOUNTER — Encounter: Payer: Self-pay | Admitting: Obstetrics and Gynecology

## 2022-02-13 ENCOUNTER — Other Ambulatory Visit (HOSPITAL_COMMUNITY)
Admission: RE | Admit: 2022-02-13 | Discharge: 2022-02-13 | Disposition: A | Discharge status: Home / Self Care | Payer: BC Managed Care – PPO | Source: Ambulatory Visit | Attending: Obstetrics and Gynecology | Admitting: Obstetrics and Gynecology

## 2022-02-13 ENCOUNTER — Ambulatory Visit (INDEPENDENT_AMBULATORY_CARE_PROVIDER_SITE_OTHER): Payer: BC Managed Care – PPO | Admitting: Obstetrics and Gynecology

## 2022-02-13 ENCOUNTER — Encounter: Payer: Self-pay | Admitting: Hematology and Oncology

## 2022-02-13 VITALS — BP 100/72 | Ht 66.0 in | Wt 155.0 lb

## 2022-02-13 DIAGNOSIS — Z1151 Encounter for screening for human papillomavirus (HPV): Secondary | ICD-10-CM

## 2022-02-13 DIAGNOSIS — Z124 Encounter for screening for malignant neoplasm of cervix: Secondary | ICD-10-CM

## 2022-02-13 DIAGNOSIS — N952 Postmenopausal atrophic vaginitis: Secondary | ICD-10-CM | POA: Diagnosis not present

## 2022-02-13 DIAGNOSIS — N941 Unspecified dyspareunia: Secondary | ICD-10-CM

## 2022-02-13 MED ORDER — ESTRADIOL 0.1 MG/GM VA CREA: TOPICAL_CREAM | VAGINAL | 1 refills | Status: AC

## 2022-02-14 ENCOUNTER — Ambulatory Visit (INDEPENDENT_AMBULATORY_CARE_PROVIDER_SITE_OTHER): Payer: BC Managed Care – PPO

## 2022-02-14 DIAGNOSIS — I428 Other cardiomyopathies: Secondary | ICD-10-CM

## 2022-02-14 DIAGNOSIS — I429 Cardiomyopathy, unspecified: Secondary | ICD-10-CM

## 2022-02-14 LAB — ECHOCARDIOGRAM COMPLETE
AR max vel: 2.6 cm2
AV Area VTI: 2.16 cm2
AV Area mean vel: 2.25 cm2
AV Mean grad: 4 mmHg
AV Peak grad: 6.6 mmHg
Ao pk vel: 1.28 m/s
Area-P 1/2: 5.75 cm2
Calc EF: 49 %
S' Lateral: 3.4 cm
Single Plane A2C EF: 46.7 %
Single Plane A4C EF: 52.5 %

## 2022-02-15 LAB — CYTOLOGY - PAP
Comment: NEGATIVE
Diagnosis: NEGATIVE
High risk HPV: NEGATIVE

## 2022-03-21 ENCOUNTER — Ambulatory Visit
Admission: EM | Admit: 2022-03-21 | Discharge: 2022-03-21 | Disposition: A | Discharge status: Home / Self Care | Payer: BC Managed Care – PPO | Attending: Emergency Medicine | Admitting: Emergency Medicine

## 2022-03-21 ENCOUNTER — Encounter: Payer: Self-pay | Admitting: Emergency Medicine

## 2022-03-21 ENCOUNTER — Ambulatory Visit (INDEPENDENT_AMBULATORY_CARE_PROVIDER_SITE_OTHER): Payer: BC Managed Care – PPO

## 2022-03-21 DIAGNOSIS — W19XXXA Unspecified fall, initial encounter: Secondary | ICD-10-CM

## 2022-03-21 DIAGNOSIS — T07XXXA Unspecified multiple injuries, initial encounter: Secondary | ICD-10-CM

## 2022-03-21 DIAGNOSIS — S8002XA Contusion of left knee, initial encounter: Secondary | ICD-10-CM

## 2022-03-21 DIAGNOSIS — M79672 Pain in left foot: Secondary | ICD-10-CM

## 2022-03-21 DIAGNOSIS — S90112A Contusion of left great toe without damage to nail, initial encounter: Secondary | ICD-10-CM

## 2022-03-21 DIAGNOSIS — M25562 Pain in left knee: Secondary | ICD-10-CM | POA: Diagnosis not present

## 2022-03-21 MED ORDER — IBUPROFEN 600 MG PO TABS: 600.0000 mg | ORAL_TABLET | Freq: Four times a day (QID) | ORAL | 0 refills | Status: AC | PRN

## 2022-03-21 NOTE — ED Provider Notes (Signed)
HPI  SUBJECTIVE:  Heather Bell is a 46 y.o. female who presents with a trip and fall up some stairs this morning at 0845.  She reports multiple abrasions over her knees, right forearm, right ankle.  She has cleaned these out with soap and water.  She states that she is unable to bend the toes of her left foot due to pain.  No bruising, numbness or tingling.  She was ambulatory immediately after the fall.  She tried ibuprofen 800 mg with improvement in her pain.  Symptoms are worse when she tries to move her toes.  She also reports pain, swelling, abrasion on proximal left tibia.  She applied ice and took ibuprofen with improvement in her symptoms.  No aggravating factors.  She also reports right ankle pain present only with movement.  She can move her ankle around without any limitation of motion.  No swelling, bruising.  She is ambulatory immediately after the fall.  She has a past medical history of breast cancer, diabetes, hypercholesterolemia, GERD, bipolar.  No history of osteoporosis.  LMP: Over 2 years ago.  PCP: [facility]   Past Medical History:  Diagnosis Date   Allergic rhinitis    Anxiety    Bipolar disorder ([place])    depression   Breast cancer ([place]) 03/21/2020   Right mastectomy   Cancer ([place])    breast right   Depression    Diabetes ([place])    GERD (gastroesophageal reflux disease)    Hyperlipidemia    Personal history of chemotherapy    Jan 2021 through July 2021    Past Surgical History:  Procedure Laterality Date   BREAST BIOPSY Right 07/22/2019   mass bx, path pending, heart marker   BREAST BIOPSY Right 07/22/2019   LN bx, path pending,  butterfly hydromarker   BREAST RECONSTRUCTION WITH PLACEMENT OF TISSUE EXPANDER AND FLEX HD (ACELLULAR HYDRATED DERMIS) Right 03/21/2020   Procedure: RIGHT BREAST RECONSTRUCTION WITH PLACEMENT OF TISSUE EXPANDER AND FLEX HD (ACELLULAR HYDRATED DERMIS);  Surgeon: [name], MD;  Location: [facility];  Service: Plastics;   Laterality: Right;   BREAST RECONSTRUCTION WITH PLACEMENT OF TISSUE EXPANDER AND FLEX HD (ACELLULAR HYDRATED DERMIS) Right 12/27/2020   Procedure: BREAST RECONSTRUCTION WITH PLACEMENT OF TISSUE EXPANDER AND FLEX HD (ACELLULAR HYDRATED DERMIS);  Surgeon: [name], MD;  Location: [facility];  Service: Plastics;  Laterality: Right;   BREAST REDUCTION SURGERY Left 06/27/2021   Procedure: Left breast reduction;  Surgeon: [name], MD;  Location: [place];  Service: Plastics;  Laterality: Left;   IMAGE GUIDED SINUS SURGERY     INCISION AND DRAINAGE OF WOUND Right 05/02/2020   Procedure: IRRIGATION AND DEBRIDEMENT WOUND WITH REMOVAL OF RIGHT TISSUE EXPANDER;  Surgeon: [name], MD;  Location: [facility];  Service: Plastics;  Laterality: Right;  1 hour please   KNEE SURGERY     MASTECTOMY Right 03/21/2020   NASAL SEPTUM SURGERY     PORTACATH PLACEMENT N/A 08/07/2019   Procedure: INSERTION PORT-A-CATH;  Surgeon: Herbert Pun, MD;  Location: [facility];  Service: General;  Laterality: N/A;   REDUCTION MAMMAPLASTY     REMOVAL OF TISSUE EXPANDER AND PLACEMENT OF IMPLANT Right 06/27/2021   Procedure: Removal right breast tissue expander and placement of gel implant;  Surgeon: [name], MD;  Location: [facility];  Service: Plastics;  Laterality: Right;   TONSILLECTOMY     TOTAL MASTECTOMY Right 03/21/2020  Procedure: TOTAL MASTECTOMY;  Surgeon: Herbert Pun, MD;  Location: [facility];  Service: General;  Laterality: Right;    Family History  Problem Relation Age of Onset   Lung cancer Father    Heart attack Father    Lung cancer Paternal Aunt    Lung cancer Paternal Uncle    Heart disease Paternal Uncle    Breast cancer Neg Hx     Social History   Tobacco Use   Smoking status: Never   Smokeless tobacco: Never  Vaping Use   Vaping Use: Never used  Substance Use Topics   Alcohol use: No     Alcohol/week: 0.0 standard drinks of alcohol   Drug use: No    No current facility-administered medications for this encounter.  Current Outpatient Medications:    ibuprofen (ADVIL) 600 MG tablet, Take 1 tablet (600 mg total) by mouth every 6 (six) hours as needed., Disp: 30 tablet, Rfl: 0   Accu-Chek FastClix Lancets MISC, See admin instructions., Disp: , Rfl:    ACCU-CHEK GUIDE test strip, SMARTSIG:1 Strip(s) Via Meter Daily PRN, Disp: , Rfl:    amphetamine-dextroamphetamine (ADDERALL XR) 25 MG 24 hr capsule, Take 25 mg by mouth daily., Disp: , Rfl:    Blood Glucose Monitoring Suppl (ACCU-CHEK GUIDE ME) w/Device KIT, USE 1 DEVICE ONCE DAILY AS DIRECTED, Disp: , Rfl:    buPROPion (WELLBUTRIN XL) 300 MG 24 hr tablet, Take 300 mg by mouth every morning., Disp: , Rfl:    Calcium Carb-Cholecalciferol (CALCIUM 600+D) 600-800 MG-UNIT TABS, Take 1 tablet by mouth daily., Disp: , Rfl:    cetirizine (ZYRTEC) 10 MG tablet, Take 10 mg by mouth daily. , Disp: , Rfl:    diazepam (VALIUM) 10 MG tablet, Take 10 mg by mouth. As needed prior to procedures, Disp: , Rfl:    doxepin (SINEQUAN) 25 MG capsule, Take 25-50 mg by mouth at bedtime. , Disp: , Rfl:    estradiol (ESTRACE) 0.1 MG/GM vaginal cream, Insert 1g nightly for 1 wk, then 1 g once weekly as maintenance; can apply pea sized amount externally at vaginal opening weekly, Disp: 42.5 g, Rfl: 1   lamoTRIgine (LAMICTAL) 150 MG tablet, Take 150 mg by mouth daily., Disp: , Rfl:    lurasidone (LATUDA) 40 MG TABS tablet, Take 40 mg by mouth at bedtime. , Disp: , Rfl:    Lurasidone HCl 60 MG TABS, Take 60 mg by mouth at bedtime. , Disp: , Rfl:    metFORMIN (GLUCOPHAGE-XR) 500 MG 24 hr tablet, Take 500 mg by mouth 2 (two) times daily., Disp: , Rfl:    mometasone (NASONEX) 50 MCG/ACT nasal spray, Place 2 sprays into the nose daily as needed (allergies). , Disp: , Rfl:    Multiple Vitamin (MULTIVITAMIN WITH MINERALS) TABS tablet, Take 1 tablet by mouth daily.,  Disp: , Rfl:    omeprazole (PRILOSEC) 20 MG capsule, Take 20 mg by mouth daily before breakfast. , Disp: , Rfl:    QUEtiapine (SEROQUEL) 300 MG tablet, Take 300 mg by mouth at bedtime., Disp: , Rfl:    simvastatin (ZOCOR) 20 MG tablet, Take 20 mg by mouth daily., Disp: , Rfl:    topiramate (TOPAMAX) 100 MG tablet, Take 50-100 mg by mouth See admin instructions. Take 1 tablet (100 mg) by mouth in the morning & take 0.5 tablet (50 mg) by mouth at night., Disp: , Rfl:    vitamin B-12 (CYANOCOBALAMIN) 1000 MCG tablet, Take 1,000 mcg by mouth daily., Disp: , Rfl:  Facility-Administered Medications Ordered in Other Encounters:    0.9 %  sodium chloride infusion, , Intravenous, Continuous, Corcoran, Melissa C, MD, Stopped at 01/08/20 1516   sodium chloride flush (NS) 0.9 % injection 10 mL, 10 mL, Intravenous, PRN, Mike Gip, Melissa C, MD, 10 mL at 01/08/20 1314  Allergies  Allergen Reactions   Compazine [Prochlorperazine Edisylate] Other (See Comments)    Acute dystonic reaction   Morphine And Related Shortness Of Breath   Famotidine Nausea Only   Doxycycline Other (See Comments)    "made all my symptoms worse"    Fluticasone Other (See Comments)    Doesn't work   Levofloxacin Other (See Comments)    "made all my symptoms worse"      ROS  As noted in HPI.   Physical Exam  BP 103/72 (BP Location: Right Arm)   Pulse 95   Temp 98.5 F (36.9 C) (Oral)   Resp 18   SpO2 98%   Constitutional: Well developed, well nourished, no acute distress Eyes:  EOMI, conjunctiva normal bilaterally HENT: Normocephalic, atraumatic,mucus membranes moist Respiratory: Normal inspiratory effort Cardiovascular: Normal rate GI: nondistended skin: Multiple abrasions over right forearm, bilateral knees, right ankle         Musculoskeletal:  L Knee ROM baseline for Pt, bruising, swelling along the tibial tubercle, flexion  intact, Patella NT, Patellar tendon NT, Medial joint NT, Lateral joint NT,  Popliteal region NT,Distal NVI with intact baseline sensation / motor / pulse distal to knee.  No effusion. No erythema. No increased temperature. No crepitus.  Patient walking comfortably.  Left foot: Positive tenderness along the first toe proximal phalanx and PIP. Bruising at proximal phalanx first toe Rest of the toes are nontender.  Midfoot NT. Base of fifth metatarsal NT.  Skin intact. DP 2+. Refill less than 2 seconds. Sensation grossly intact. Patient has difficulty moving all toes.  no pain with inversion / eversion,  dorsiflexion / plantarflexion. No Tenderness along the plantar fascia. Distal fibula NT, Medial malleolus NT,  Deltoid ligament NT, Lateral ligaments NT, Achilles NT. Patient able to bear weight while in department.   R Ankle: Proximal fibula NT , Distal fibula NT , Medial malleolus NT ,  Deltoid ligaments medially NT ,  ATFL NT , calcaneofibular ligament NT, posterior tablofibular ligament NT ,  Achilles NT, calcaneus  NT,  Proximal 5th metatarsal NT, Midfoot NT, distal NVI with baseline sensation / motor to foot with DP 2+.  no pain with dorsiflexion/plantar flexion.  no pain with inversion/eversion. No bruising. - squeeze test.  Several small abrasions.  Pt able to bear weight in dept.    Neurologic: Alert & oriented x 3, no focal neuro deficits Psychiatric: Speech and behavior appropriate   ED Course   Medications - No data to display  Orders Placed This Encounter  Procedures   DG Foot Complete Left    Standing Status:   Standing    Number of Occurrences:   1    Order Specific Question:   Reason for Exam (SYMPTOM  OR DIAGNOSIS REQUIRED)    Answer:   Fall, direct trauma.  Tenderness along the first toe rule out fracture   DG Knee Complete 4 Views Left    Standing Status:   Standing    Number of Occurrences:   1    Order Specific Question:   Reason for Exam (SYMPTOM  OR DIAGNOSIS REQUIRED)    Answer:   Fall, rule out tibial plateau fracture    No  results found  for this or any previous visit (from the past 24 hour(s)). DG Knee Complete 4 Views Left  Result Date: 03/21/2022 CLINICAL DATA:  Fall, knee pain. EXAM: LEFT KNEE - COMPLETE 4+ VIEW COMPARISON:  June 26, 2016 FINDINGS: No evidence of fracture, dislocation, or joint effusion. No evidence of significant arthropathy or other focal bone abnormality. Soft tissues are unremarkable. IMPRESSION: No acute osseous abnormality. Electronically Signed   By: Dahlia Bailiff M.D.   On: 03/21/2022 12:10   DG Foot Complete Left  Result Date: 03/21/2022 CLINICAL DATA:  Fall, tenderness along the first digit. EXAM: LEFT FOOT - COMPLETE 3+ VIEW COMPARISON:  None Available. FINDINGS: There is no evidence of fracture or dislocation. There is no evidence of arthropathy or other focal bone abnormality. Soft tissues are unremarkable. IMPRESSION: No acute osseous abnormality. Electronically Signed   By: Dahlia Bailiff M.D.   On: 03/21/2022 12:09    ED Clinical Impression  1. Contusion of left knee, initial encounter   2. Contusion of left great toe without damage to nail, initial encounter   3. Abrasions of multiple sites   4. Fall, initial encounter      ED Assessment/Plan  We will obtain left knee, foot to rule out acute fracture.  Patient meets the [facility] ankle rules.  Do not think that she needs an x-ray of the right ankle.  Reviewed imaging independently. no fracture of the knee or toe see radiology report for full details.  Patient with multiple abrasions, knee and big toe contusion post fall.  X-rays are negative for fracture.  Home with Tylenol/ibuprofen, bacitracin.  May buddy tape foot.  Ice the knee and foot.  Follow-up with PCP as needed.  Discussed , imaging, MDM, treatment plan, and plan for follow-up with patient.patient agrees with plan.   Meds ordered this encounter  Medications   ibuprofen (ADVIL) 600 MG tablet    Sig: Take 1 tablet (600 mg total) by mouth every 6 (six) hours as needed.     Dispense:  30 tablet    Refill:  0      *This clinic note was created using [employer]. Therefore, there may be occasional mistakes despite careful proofreading.  ?    Melynda Ripple, MD 03/21/22 1240

## 2022-03-21 NOTE — ED Triage Notes (Signed)
Pt reports going up front steps this morning and tripped falling. Has pain in bilat feet, legs, knee, right forearm. Denies hitting head  Has some abrasions to right forearm, bilat knees. Unsure when last tetanus shot was

## 2022-03-21 NOTE — ED Notes (Signed)
Pt found that her last tetanus shot was in 2018

## 2022-03-21 NOTE — Discharge Instructions (Addendum)
Your x-rays are negative for fracture.  Take 600 mg of ibuprofen, 1000 mg of Tylenol 3-4 times a day, apply bacitracin antibiotic ointment for the next few days, which you can get over-the-counter.  Keep the abrasions clean with soap and water.  May buddy tape the big toe to the second toe for support.  Ice the knee and foot.

## 2022-03-22 NOTE — Progress Notes (Signed)
Cardiology Office Note    Date:  03/23/2022   ID:  Ennis, Delpozo [birth year], MRN [id number]  PCP:  [facility]  Cardiologist:  Nelva Bush, MD  Electrophysiologist:  None   Chief Complaint: Follow-up  History of Present Illness:   Heather Bell is a 46 y.o. female with history of mitral regurgitation, breast cancer status post neoadjuvant chemotherapy followed by right total mastectomy with reconstruction, DM2, HLD, normocytic anemia, and depression who presents for follow-up of echo.   Prior echo in 07/2019 demonstrated an EF of 50 to 55%, global hypokinesis, normal RV systolic function and ventricular cavity size, trivial mitral regurgitation, and an estimated right atrial pressure of 3 mmHg.  Due to suboptimal images, we have been following her EF with serial MUGA scans.  Most recent MUGA scan on 08/16/2021 demonstrated an EF of 53% which was stable when compared to study in 01/2021, though was slightly down when compared to study from 01/2020.  She underwent removal of right breast tissue expander and replacement of gel implant as well as breast reduction in 06/2021.  She was last seen in our office in 08/2021 and was without symptoms of angina or decompensation.  Relative hypotension has precluded escalation of GDMT.  Echo on 02/14/2022 demonstrated an EF of 50 to 55%, no regional wall motion abnormalities, normal LV diastolic function parameters, normal RV systolic function and ventricular cavity size, trivial mitral regurgitation, and an estimated right atrial pressure of 3 mmHg.  She comes in doing well from a cardiac perspective and is without symptoms of angina or decompensation.  No significant lower extremity swelling or orthopnea.  She does note some shortness of breath if she is working at a fast pace.  She remains active.  She has had 1 mechanical fall since she was last seen, though did not hit her head or suffer LOC.  She does not have any cardiac issues or  concerns at this time.   Labs independently reviewed: 12/2021 - Hgb 11.3, PLT 297, potassium 3.7, BUN 12, serum creatinine 0.84, albumin 3.8, AST/ALT normal 02/2020 - magnesium 1.8 09/2019 - TSH normal  Past Medical History:  Diagnosis Date   Allergic rhinitis    Anxiety    Bipolar disorder ([place])    depression   Breast cancer ([place]) 03/21/2020   Right mastectomy   Cancer ([place])    breast right   Depression    Diabetes ([place])    GERD (gastroesophageal reflux disease)    Hyperlipidemia    Personal history of chemotherapy    Jan 2021 through July 2021    Past Surgical History:  Procedure Laterality Date   BREAST BIOPSY Right 07/22/2019   mass bx, path pending, heart marker   BREAST BIOPSY Right 07/22/2019   LN bx, path pending,  butterfly hydromarker   BREAST RECONSTRUCTION WITH PLACEMENT OF TISSUE EXPANDER AND FLEX HD (ACELLULAR HYDRATED DERMIS) Right 03/21/2020   Procedure: RIGHT BREAST RECONSTRUCTION WITH PLACEMENT OF TISSUE EXPANDER AND FLEX HD (ACELLULAR HYDRATED DERMIS);  Surgeon: [name], MD;  Location: [facility];  Service: Plastics;  Laterality: Right;   BREAST RECONSTRUCTION WITH PLACEMENT OF TISSUE EXPANDER AND FLEX HD (ACELLULAR HYDRATED DERMIS) Right 12/27/2020   Procedure: BREAST RECONSTRUCTION WITH PLACEMENT OF TISSUE EXPANDER AND FLEX HD (ACELLULAR HYDRATED DERMIS);  Surgeon: [name], MD;  Location: [facility];  Service: Plastics;  Laterality: Right;   BREAST REDUCTION SURGERY Left 06/27/2021   Procedure: Left breast reduction;  Surgeon: [name], MD;  Location: [facility];  Service: Plastics;  Laterality: Left;   IMAGE GUIDED SINUS SURGERY     INCISION AND DRAINAGE OF WOUND Right 05/02/2020   Procedure: IRRIGATION AND DEBRIDEMENT WOUND WITH REMOVAL OF RIGHT TISSUE EXPANDER;  Surgeon: [name], MD;  Location: [facility];  Service: Plastics;  Laterality: Right;  1 hour please   KNEE SURGERY      MASTECTOMY Right 03/21/2020   NASAL SEPTUM SURGERY     PORTACATH PLACEMENT N/A 08/07/2019   Procedure: INSERTION PORT-A-CATH;  Surgeon: Herbert Pun, MD;  Location: [facility];  Service: General;  Laterality: N/A;   REDUCTION MAMMAPLASTY     REMOVAL OF TISSUE EXPANDER AND PLACEMENT OF IMPLANT Right 06/27/2021   Procedure: Removal right breast tissue expander and placement of gel implant;  Surgeon: [name], MD;  Location: [facility];  Service: Plastics;  Laterality: Right;   TONSILLECTOMY     TOTAL MASTECTOMY Right 03/21/2020   Procedure: TOTAL MASTECTOMY;  Surgeon: Herbert Pun, MD;  Location: [facility];  Service: General;  Laterality: Right;    Current Medications: Current Meds  Medication Sig   Accu-Chek FastClix Lancets MISC See admin instructions.   ACCU-CHEK GUIDE test strip SMARTSIG:1 Strip(s) Via Meter Daily PRN   amphetamine-dextroamphetamine (ADDERALL XR) 25 MG 24 hr capsule Take 25 mg by mouth daily.   Blood Glucose Monitoring Suppl (ACCU-CHEK GUIDE ME) w/Device KIT USE 1 DEVICE ONCE DAILY AS DIRECTED   buPROPion (WELLBUTRIN XL) 300 MG 24 hr tablet Take 300 mg by mouth every morning.   Calcium Carb-Cholecalciferol (CALCIUM 600+D) 600-800 MG-UNIT TABS Take 1 tablet by mouth daily.   cetirizine (ZYRTEC) 10 MG tablet Take 10 mg by mouth daily.    diazepam (VALIUM) 10 MG tablet Take 10 mg by mouth. As needed prior to procedures   doxepin (SINEQUAN) 25 MG capsule Take 25-50 mg by mouth at bedtime.    estradiol (ESTRACE) 0.1 MG/GM vaginal cream Insert 1g nightly for 1 wk, then 1 g once weekly as maintenance; can apply pea sized amount externally at vaginal opening weekly   ibuprofen (ADVIL) 600 MG tablet Take 1 tablet (600 mg total) by mouth every 6 (six) hours as needed.   lamoTRIgine (LAMICTAL) 150 MG tablet Take 150 mg by mouth daily.   lurasidone (LATUDA) 80 MG TABS tablet Take 80 mg by mouth daily.   metFORMIN (GLUCOPHAGE-XR) 500 MG 24 hr  tablet Take 500 mg by mouth 2 (two) times daily.   mometasone (NASONEX) 50 MCG/ACT nasal spray Place 2 sprays into the nose daily as needed (allergies).    Multiple Vitamin (MULTIVITAMIN WITH MINERALS) TABS tablet Take 1 tablet by mouth daily.   omeprazole (PRILOSEC) 20 MG capsule Take 20 mg by mouth daily before breakfast.    QUEtiapine (SEROQUEL) 300 MG tablet Take 300 mg by mouth at bedtime.   simvastatin (ZOCOR) 20 MG tablet Take 20 mg by mouth daily.   topiramate (TOPAMAX) 100 MG tablet Take 50-100 mg by mouth See admin instructions. Take 1 tablet (100 mg) by mouth in the morning & take 0.5 tablet (50 mg) by mouth at night.   vitamin B-12 (CYANOCOBALAMIN) 1000 MCG tablet Take 1,000 mcg by mouth daily.    Allergies:   Compazine [prochlorperazine edisylate], Morphine and related, Famotidine, Doxycycline, Fluticasone, and Levofloxacin   Social History   Socioeconomic History   Marital status: Married    Spouse name: Not on file  Number of children: Not on file   Years of education: Not on file   Highest education level: Not on file  Occupational History   Not on file  Tobacco Use   Smoking status: Never   Smokeless tobacco: Never  Vaping Use   Vaping Use: Never used  Substance and Sexual Activity   Alcohol use: No    Alcohol/week: 0.0 standard drinks of alcohol   Drug use: No   Sexual activity: Yes  Other Topics Concern   Not on file  Social History Narrative   Not on file   Social Determinants of Health   Financial Resource Strain: Not on file  Food Insecurity: Not on file  Transportation Needs: No Transportation Needs (01/19/2022)   PRAPARE - Hydrologist (Medical): No    Lack of Transportation (Non-Medical): No  Physical Activity: Not on file  Stress: Not on file  Social Connections: Not on file     Family History:  The patient's family history includes Heart attack in her father; Heart disease in her paternal uncle; Lung cancer in  her father, paternal aunt, and paternal uncle. There is no history of Breast cancer.  ROS:   12-point review systems is negative unless otherwise noted in HPI.   EKGs/Labs/Other Studies Reviewed:    Studies reviewed were summarized above. The additional studies were reviewed today:  2D echo 02/14/2022: 1. Left ventricular ejection fraction, by estimation, is 50 to 55%. The  left ventricle has low normal function. The left ventricle has no regional  wall motion abnormalities. Left ventricular diastolic parameters were  normal.   2. Right ventricular systolic function is normal. The right ventricular  size is normal.   3. The mitral valve is normal in structure. Trivial mitral valve  regurgitation.   4. The aortic valve is tricuspid. Aortic valve regurgitation is not  visualized.   5. The inferior vena cava is normal in size with greater than 50%  respiratory variability, suggesting right atrial pressure of 3 mmHg. __________  2D echo 08/13/2019: 1. Left ventricular ejection fraction, by visual estimation, is 50 to  55%. The left ventricle has normal function. There is no left ventricular  hypertrophy.   2. The left ventricle demonstrates global hypokinesis.   3. Global right ventricle has normal systolic function.The right  ventricular size is normal. No increase in right ventricular wall  thickness.   4. Left atrial size was normal.   5. Right atrial size was normal.   6. The mitral valve is normal in structure. Trivial mitral valve  regurgitation. No evidence of mitral stenosis.   7. The tricuspid valve is normal in structure.   8. The aortic valve is normal in structure. Aortic valve regurgitation is  not visualized. No evidence of aortic valve sclerosis or stenosis.   9. The pulmonic valve was normal in structure. Pulmonic valve  regurgitation is not visualized.  10. The inferior vena cava is normal in size with greater than 50%  respiratory variability, suggesting right  atrial pressure of 3 mmHg.   EKG:  EKG is ordered today.  The EKG ordered today demonstrates NSR, 90 bpm, no acute ST-T changes, no significant change when compared to prior tracing  Recent Labs: 12/19/2021: ALT 14; BUN 12; Creatinine, Ser 0.84; Hemoglobin 11.3; Platelets 297; Potassium 3.7; Sodium 139  Recent Lipid Panel No results found for: "CHOL", "TRIG", "HDL", "CHOLHDL", "VLDL", "LDLCALC", "LDLDIRECT"  PHYSICAL EXAM:    VS:  BP 110/70 (BP  Location: Left Arm, Patient Position: Sitting, Cuff Size: Normal)   Pulse 90   Ht '5\' 6"'  (1.676 m)   Wt 155 lb 6 oz (70.5 kg)   SpO2 99%   BMI 25.08 kg/m   BMI: Body mass index is 25.08 kg/m.  Physical Exam Vitals reviewed.  Constitutional:      Appearance: She is well-developed.  HENT:     Head: Normocephalic and atraumatic.  Eyes:     General:        Right eye: No discharge.        Left eye: No discharge.  Cardiovascular:     Rate and Rhythm: Normal rate and regular rhythm.     Pulses:          Posterior tibial pulses are 2+ on the right side and 2+ on the left side.     Heart sounds: Normal heart sounds, S1 normal and S2 normal. Heart sounds not distant. No midsystolic click and no opening snap. No murmur heard.    No friction rub.  Pulmonary:     Effort: Pulmonary effort is normal. No respiratory distress.     Breath sounds: Normal breath sounds. No decreased breath sounds, wheezing or rales.  Chest:     Chest wall: No tenderness.  Abdominal:     General: There is no distension.  Musculoskeletal:     Cervical back: Normal range of motion.     Right lower leg: No edema.     Left lower leg: No edema.  Skin:    General: Skin is warm and dry.     Nails: There is no clubbing.  Neurological:     Mental Status: She is alert and oriented to person, place, and time.  Psychiatric:        Speech: Speech normal.        Behavior: Behavior normal.        Thought Content: Thought content normal.        Judgment: Judgment normal.      Wt Readings from Last 3 Encounters:  03/23/22 155 lb 6 oz (70.5 kg)  02/13/22 155 lb (70.3 kg)  01/19/22 156 lb (70.8 kg)     ASSESSMENT & PLAN:   Breast cancer with abnormal echo and at risk for cardiomyopathy: She continues to do well from a cardiac perspective, and is without symptoms of angina or decompensation.  She has completed treatment of her breast cancer with initial concern for mildly reduced LVEF by echo in 2020 as outlined above.  Subsequent MUGA scans have demonstrated stable, low normal LV systolic function.  Most recent echo from 01/2022 showed a stable, low normal LVEF of 50 to 55% with stable trivial mitral regurgitation.  Given she is asymptomatic, and in the context of her LV systolic function remaining stable and low normal, with relative hypotension, we will defer addition of beta-blocker or ARB at this time in an effort to minimize significant hypotension.  Defer ischemic testing given lack of anginal symptoms with stable EF.  Should she become symptomatic, or have new decline in her EF down the road, we would need to consider ischemic evaluation with possible referral to cardio oncology.  Mitral regurgitation: Trivial on echo, and stable dating back at least to 2020.    Disposition: F/u with Dr. Saunders Revel or an APP in 01/2023.   Medication Adjustments/Labs and Tests Ordered: Current medicines are reviewed at length with the patient today.  Concerns regarding medicines are outlined above. Medication changes, Labs and  Tests ordered today are summarized above and listed in the Patient Instructions accessible in Encounters.   Signed, Christell Faith, PA-C 03/23/2022 2:41 PM     [address] [place], [place] [address] [phone]

## 2022-03-23 ENCOUNTER — Encounter: Payer: Self-pay | Admitting: Physician Assistant

## 2022-03-23 ENCOUNTER — Ambulatory Visit (INDEPENDENT_AMBULATORY_CARE_PROVIDER_SITE_OTHER): Payer: BC Managed Care – PPO | Admitting: Physician Assistant

## 2022-03-23 VITALS — BP 110/70 | HR 90 | Ht 66.0 in | Wt 155.4 lb

## 2022-03-23 DIAGNOSIS — Z9189 Other specified personal risk factors, not elsewhere classified: Secondary | ICD-10-CM | POA: Diagnosis not present

## 2022-03-23 DIAGNOSIS — I340 Nonrheumatic mitral (valve) insufficiency: Secondary | ICD-10-CM | POA: Diagnosis not present

## 2022-03-23 NOTE — Patient Instructions (Signed)
Medication Instructions:  No changes at this time.   *If you need a refill on your cardiac medications before your next appointment, please call your pharmacy*   Lab Work: None  If you have labs (blood work) drawn today and your tests are completely normal, you will receive your results only by: [place] (if you have MyChart) OR A paper copy in the mail If you have any lab test that is abnormal or we need to change your treatment, we will call you to review the results.   Testing/Procedures: Your physician has requested that you have an echocardiogram in June 2024.   Echocardiography is a painless test that uses sound waves to create images of your heart. It provides your doctor with information about the size and shape of your heart and how well your heart's chambers and valves are working. This procedure takes approximately one hour. There are no restrictions for this procedure.    Follow-Up: At [facility], you and your health needs are our priority.  As part of our continuing mission to provide you with exceptional heart care, we have created designated Provider Care Teams.  These Care Teams include your primary Cardiologist (physician) and Advanced Practice Providers (APPs -  Physician Assistants and Nurse Practitioners) who all work together to provide you with the care you need, when you need it.   Your next appointment:   Follow up after echocardiogram has been done in June 2024  The format for your next appointment:   In Person  Provider:   Nelva Bush, MD or Christell Faith, PA-C      Important Information About Sugar

## 2022-07-23 ENCOUNTER — Other Ambulatory Visit: Payer: BC Managed Care – PPO

## 2022-07-23 ENCOUNTER — Ambulatory Visit: Payer: BC Managed Care – PPO | Admitting: Internal Medicine

## 2022-08-03 ENCOUNTER — Other Ambulatory Visit: Payer: BC Managed Care – PPO

## 2022-08-03 ENCOUNTER — Ambulatory Visit: Payer: BC Managed Care – PPO | Admitting: Internal Medicine

## 2022-09-13 ENCOUNTER — Other Ambulatory Visit: Payer: Self-pay | Admitting: Obstetrics and Gynecology

## 2022-09-13 DIAGNOSIS — N952 Postmenopausal atrophic vaginitis: Secondary | ICD-10-CM

## 2022-09-13 DIAGNOSIS — N941 Unspecified dyspareunia: Secondary | ICD-10-CM

## 2022-09-14 ENCOUNTER — Ambulatory Visit
Admission: RE | Admit: 2022-09-14 | Discharge: 2022-09-14 | Disposition: A | Discharge status: Home / Self Care | Payer: BC Managed Care – PPO | Source: Ambulatory Visit

## 2022-09-14 VITALS — BP 122/82 | HR 82 | Temp 98.6°F | Resp 18

## 2022-09-14 DIAGNOSIS — K0889 Other specified disorders of teeth and supporting structures: Secondary | ICD-10-CM

## 2022-09-14 MED ORDER — AMOXICILLIN 875 MG PO TABS: 875.0000 mg | ORAL_TABLET | Freq: Two times a day (BID) | ORAL | 0 refills | Status: AC

## 2022-09-14 NOTE — Discharge Instructions (Addendum)
Take the antibiotic as prescribed.    A dental resource guide is attached.  Please call to make an appointment with a dentist as soon as possible.

## 2022-09-14 NOTE — ED Provider Notes (Signed)
Heather Bell    CSN: [id number] Arrival date & time: 09/14/22  1408      History   Chief Complaint Chief Complaint  Patient presents with   Dental Problem    I believe I have an infection.  My gums are swollen and painful around a couple of teeth and under my jaw. - Entered by patient    HPI Heather Bell is a 47 y.o. female.   Patient presents with right lower tooth and gum pain x 3 days.  She has an appointment scheduled with her dentist next week.  She denies fever, chills, difficulty swallowing, or other symptoms.  No OTC medications taken today.  Her medical history includes diabetes, cardiomyopathy, breast cancer, GERD, anemia, bipolar disorder, depression, anxiety.   The history is provided by the patient and medical records.    Past Medical History:  Diagnosis Date   Allergic rhinitis    Anxiety    Bipolar disorder ([place])    depression   Breast cancer ([place]) 03/21/2020   Right mastectomy   Cancer ([place])    breast right   Depression    Diabetes ([place])    GERD (gastroesophageal reflux disease)    Hyperlipidemia    Personal history of chemotherapy    Jan 2021 through July 2021    Patient Active Problem List   Diagnosis Date Noted   Vaginal atrophy 02/13/2022   Abnormal echocardiogram 02/17/2021   History of breast cancer 02/17/2021   Breast cancer ([place]) 03/21/2020   Anemia due to antineoplastic chemotherapy 02/15/2020   Cardiomyopathy ([place]) 12/12/2019   Dehydration 11/24/2019   Acute pain of left shoulder 11/24/2019   Orthostatic hypotension 10/12/2019   Low serum cortisol level 09/29/2019   Carcinoma of lower-outer quadrant of right breast in female, estrogen receptor negative ([place]) 09/28/2019   Hypokalemia 09/15/2019   Hypocalcemia 09/15/2019   Normocytic anemia 09/15/2019   B12 deficiency 09/15/2019   Iron deficiency anemia 09/15/2019   Encounter for antineoplastic chemotherapy 08/25/2019   Goals of care, counseling/discussion 08/17/2019    Weight loss 07/31/2019   H/O diarrhea 01/01/2017    Past Surgical History:  Procedure Laterality Date   BREAST BIOPSY Right 07/22/2019   mass bx, path pending, heart marker   BREAST BIOPSY Right 07/22/2019   LN bx, path pending,  butterfly hydromarker   BREAST RECONSTRUCTION WITH PLACEMENT OF TISSUE EXPANDER AND FLEX HD (ACELLULAR HYDRATED DERMIS) Right 03/21/2020   Procedure: RIGHT BREAST RECONSTRUCTION WITH PLACEMENT OF TISSUE EXPANDER AND FLEX HD (ACELLULAR HYDRATED DERMIS);  Surgeon: [name], MD;  Location: [facility];  Service: Plastics;  Laterality: Right;   BREAST RECONSTRUCTION WITH PLACEMENT OF TISSUE EXPANDER AND FLEX HD (ACELLULAR HYDRATED DERMIS) Right 12/27/2020   Procedure: BREAST RECONSTRUCTION WITH PLACEMENT OF TISSUE EXPANDER AND FLEX HD (ACELLULAR HYDRATED DERMIS);  Surgeon: [name], MD;  Location: [place];  Service: Plastics;  Laterality: Right;   BREAST REDUCTION SURGERY Left 06/27/2021   Procedure: Left breast reduction;  Surgeon: [name], MD;  Location: [place];  Service: Plastics;  Laterality: Left;   IMAGE GUIDED SINUS SURGERY     INCISION AND DRAINAGE OF WOUND Right 05/02/2020   Procedure: IRRIGATION AND DEBRIDEMENT WOUND WITH REMOVAL OF RIGHT TISSUE EXPANDER;  Surgeon: [name], MD;  Location: [place];  Service: Plastics;  Laterality: Right;  1 hour please   KNEE SURGERY     MASTECTOMY Right 03/21/2020   NASAL SEPTUM SURGERY  PORTACATH PLACEMENT N/A 08/07/2019   Procedure: INSERTION PORT-A-CATH;  Surgeon: Herbert Pun, MD;  Location: [facility];  Service: General;  Laterality: N/A;   REDUCTION MAMMAPLASTY     REMOVAL OF TISSUE EXPANDER AND PLACEMENT OF IMPLANT Right 06/27/2021   Procedure: Removal right breast tissue expander and placement of gel implant;  Surgeon: [name], MD;  Location: [place];  Service: Plastics;  Laterality: Right;    TONSILLECTOMY     TOTAL MASTECTOMY Right 03/21/2020   Procedure: TOTAL MASTECTOMY;  Surgeon: Herbert Pun, MD;  Location: [facility];  Service: General;  Laterality: Right;    OB History     Gravida  2   Para  2   Term  1   Preterm  1   AB      Living  2      SAB      IAB      Ectopic      Multiple      Live Births  2            Home Medications    Prior to Admission medications   Medication Sig Start Date End Date Taking? Authorizing Provider  amoxicillin (AMOXIL) 875 MG tablet Take 1 tablet (875 mg total) by mouth 2 (two) times daily for 7 days. 09/14/22 09/21/22 Yes Sharion Balloon, NP  buPROPion (WELLBUTRIN SR) 200 MG 12 hr tablet Take 200 mg by mouth 2 (two) times daily. 04/23/22  Yes [provider]  Accu-Chek FastClix Lancets MISC See admin instructions. 03/10/21   [provider]  ACCU-CHEK GUIDE test strip SMARTSIG:1 Strip(s) Via Meter Daily PRN 03/10/21   [provider]  amphetamine-dextroamphetamine (ADDERALL XR) 25 MG 24 hr capsule Take 25 mg by mouth daily. 06/29/19   [provider]  Blood Glucose Monitoring Suppl (ACCU-CHEK GUIDE ME) w/Device KIT USE 1 DEVICE ONCE DAILY AS DIRECTED 03/10/21   [provider]  buPROPion (WELLBUTRIN XL) 300 MG 24 hr tablet Take 300 mg by mouth every morning. 05/31/21   [provider]  Calcium Carb-Cholecalciferol (CALCIUM 600+D) 600-800 MG-UNIT TABS Take 1 tablet by mouth daily.    [provider]  cetirizine (ZYRTEC) 10 MG tablet Take 10 mg by mouth daily.  04/20/08   [provider]  diazepam (VALIUM) 10 MG tablet Take 10 mg by mouth. As needed prior to procedures    [provider]  doxepin (SINEQUAN) 25 MG capsule Take 100 mg by mouth at bedtime. 07/04/19   [provider]  estradiol (ESTRACE) 0.1 MG/GM vaginal cream Insert 1g nightly for 1 wk, then 1 g once weekly as maintenance; can apply pea sized amount externally at vaginal  opening weekly 1/60/73   Copland, Elmo Putt B, PA-C  ibuprofen (ADVIL) 600 MG tablet Take 1 tablet (600 mg total) by mouth every 6 (six) hours as needed. 03/21/22   Melynda Ripple, MD  lamoTRIgine (LAMICTAL) 150 MG tablet Take 150 mg by mouth daily. 10/19/20   [provider]  lurasidone (LATUDA) 80 MG TABS tablet Take 80 mg by mouth daily. 02/28/22   [provider]  metFORMIN (GLUCOPHAGE-XR) 500 MG 24 hr tablet Take 500 mg by mouth 2 (two) times daily. 07/04/19   [provider]  mometasone (NASONEX) 50 MCG/ACT nasal spray Place 2 sprays into the nose daily as needed (allergies).     [provider]  Multiple Vitamin (MULTIVITAMIN WITH MINERALS) TABS tablet Take 1 tablet by mouth daily.  [provider]  omeprazole (PRILOSEC) 20 MG capsule Take 20 mg by mouth daily before breakfast.  05/19/19   [provider]  QUEtiapine (SEROQUEL) 300 MG tablet Take 300 mg by mouth at bedtime. 02/06/20   [provider]  simvastatin (ZOCOR) 20 MG tablet Take 20 mg by mouth daily.    [provider]  topiramate (TOPAMAX) 100 MG tablet Take 50-100 mg by mouth See admin instructions. Take 1 tablet (100 mg) by mouth in the morning & take 0.5 tablet (50 mg) by mouth at night.    [provider]  vitamin B-12 (CYANOCOBALAMIN) 1000 MCG tablet Take 1,000 mcg by mouth daily.    [provider]    Family History Family History  Problem Relation Age of Onset   Lung cancer Father    Heart attack Father    Lung cancer Paternal Aunt    Lung cancer Paternal Uncle    Heart disease Paternal Uncle    Breast cancer Neg Hx     Social History Social History   Tobacco Use   Smoking status: Never   Smokeless tobacco: Never  Vaping Use   Vaping Use: Never used  Substance Use Topics   Alcohol use: No    Alcohol/week: 0.0 standard drinks of alcohol   Drug use: No     Allergies   Compazine [prochlorperazine edisylate], Morphine and  related, Famotidine, Doxycycline, Fluticasone, and Levofloxacin   Review of Systems Review of Systems  Constitutional:  Negative for chills and fever.  HENT:  Positive for dental problem. Negative for facial swelling, sore throat and trouble swallowing.   Respiratory:  Negative for cough and shortness of breath.   Cardiovascular:  Negative for chest pain and palpitations.  Skin:  Negative for color change and rash.  All other systems reviewed and are negative.    Physical Exam Triage Vital Signs ED Triage Vitals  Enc Vitals Group     BP 09/14/22 1428 122/82     Pulse --      Resp --      Temp --      Temp src --      SpO2 --      Weight --      Height --      Head Circumference --      Peak Flow --      Pain Score 09/14/22 1426 3     Pain Loc --      Pain Edu? --      Excl. in [place]? --    No data found.  Updated Vital Signs BP 122/82   Pulse 82   Temp 98.6 F (37 C)   Resp 18   SpO2 99%   Visual Acuity Right Eye Distance:   Left Eye Distance:   Bilateral Distance:    Right Eye Near:   Left Eye Near:    Bilateral Near:     Physical Exam Vitals and nursing note reviewed.  Constitutional:      General: She is not in acute distress.    Appearance: Normal appearance. She is well-developed. She is not ill-appearing.  HENT:     Mouth/Throat:     Mouth: Mucous membranes are moist.     Dentition: Abnormal dentition. Dental tenderness and dental caries present.     Pharynx: Oropharynx is clear.      Comments: Speech clear. No difficulty swallowing.  Cardiovascular:     Rate and Rhythm: Normal rate and regular rhythm.  Heart sounds: Normal heart sounds.  Pulmonary:     Effort: Pulmonary effort is normal. No respiratory distress.     Breath sounds: Normal breath sounds.  Musculoskeletal:     Cervical back: Neck supple.  Skin:    General: Skin is warm and dry.  Neurological:     Mental Status: She is alert.  Psychiatric:        Mood and Affect: Mood  normal.        Behavior: Behavior normal.      UC Treatments / Results  Labs (all labs ordered are listed, but only abnormal results are displayed) Labs Reviewed - No data to display  EKG   Radiology No results found.  Procedures Procedures (including critical care time)  Medications Ordered in UC Medications - No data to display  Initial Impression / Assessment and Plan / UC Course  I have reviewed the triage vital signs and the nursing notes.  Pertinent labs & imaging results that were available during my care of the patient were reviewed by me and considered in my medical decision making (see chart for details).    Dental pain.  Afebrile, VSS.  Treating with amoxicillin.  Dental resource guide provided and instructed patient to follow up with her dentist.  Education provided on dental pain.  ED precautions provided.  She agrees to plan of care.    Final Clinical Impressions(s) / UC Diagnoses   Final diagnoses:  Pain, dental     Discharge Instructions      Take the antibiotic as prescribed.    A dental resource guide is attached.  Please call to make an appointment with a dentist as soon as possible.          ED Prescriptions     Medication Sig Dispense Auth. Provider   amoxicillin (AMOXIL) 875 MG tablet Take 1 tablet (875 mg total) by mouth 2 (two) times daily for 7 days. 14 tablet Sharion Balloon, NP      I have reviewed the PDMP during this encounter.   Sharion Balloon, NP 09/14/22 1504

## 2022-09-14 NOTE — ED Triage Notes (Signed)
Patient to Urgent Care with complaints of dental pain (right lower). Dental appointment next week.   Reports gum swelling and pain around two teeth and under her jaw. Symptoms started 3 days ago. Swelling started last night.   Denies any known fevers.

## 2022-09-21 ENCOUNTER — Ambulatory Visit: Payer: BC Managed Care – PPO | Admitting: Internal Medicine

## 2022-09-21 ENCOUNTER — Other Ambulatory Visit: Payer: BC Managed Care – PPO

## 2022-10-02 DIAGNOSIS — F410 Panic disorder [episodic paroxysmal anxiety] without agoraphobia: Secondary | ICD-10-CM | POA: Diagnosis not present

## 2022-10-02 DIAGNOSIS — F900 Attention-deficit hyperactivity disorder, predominantly inattentive type: Secondary | ICD-10-CM | POA: Diagnosis not present

## 2022-10-09 ENCOUNTER — Encounter: Payer: Self-pay | Admitting: Internal Medicine

## 2022-10-09 ENCOUNTER — Inpatient Hospital Stay: Payer: BC Managed Care – PPO

## 2022-10-09 ENCOUNTER — Inpatient Hospital Stay: Payer: BC Managed Care – PPO | Attending: Internal Medicine | Admitting: Internal Medicine

## 2022-10-09 VITALS — BP 120/81 | HR 82 | Temp 97.3°F | Resp 16 | Wt 160.0 lb

## 2022-10-09 DIAGNOSIS — Z9011 Acquired absence of right breast and nipple: Secondary | ICD-10-CM | POA: Diagnosis not present

## 2022-10-09 DIAGNOSIS — M25562 Pain in left knee: Secondary | ICD-10-CM | POA: Insufficient documentation

## 2022-10-09 DIAGNOSIS — C50511 Malignant neoplasm of lower-outer quadrant of right female breast: Secondary | ICD-10-CM | POA: Insufficient documentation

## 2022-10-09 DIAGNOSIS — Z171 Estrogen receptor negative status [ER-]: Secondary | ICD-10-CM | POA: Insufficient documentation

## 2022-10-09 DIAGNOSIS — F418 Other specified anxiety disorders: Secondary | ICD-10-CM | POA: Diagnosis not present

## 2022-10-09 DIAGNOSIS — M25561 Pain in right knee: Secondary | ICD-10-CM | POA: Insufficient documentation

## 2022-10-09 DIAGNOSIS — E538 Deficiency of other specified B group vitamins: Secondary | ICD-10-CM | POA: Diagnosis not present

## 2022-10-09 DIAGNOSIS — D649 Anemia, unspecified: Secondary | ICD-10-CM | POA: Diagnosis not present

## 2022-10-09 DIAGNOSIS — Z79899 Other long term (current) drug therapy: Secondary | ICD-10-CM | POA: Insufficient documentation

## 2022-10-09 DIAGNOSIS — Z9221 Personal history of antineoplastic chemotherapy: Secondary | ICD-10-CM | POA: Diagnosis not present

## 2022-10-09 DIAGNOSIS — E119 Type 2 diabetes mellitus without complications: Secondary | ICD-10-CM | POA: Insufficient documentation

## 2022-10-09 DIAGNOSIS — Z801 Family history of malignant neoplasm of trachea, bronchus and lung: Secondary | ICD-10-CM | POA: Diagnosis not present

## 2022-10-09 DIAGNOSIS — G8929 Other chronic pain: Secondary | ICD-10-CM | POA: Diagnosis not present

## 2022-10-09 LAB — CBC WITH DIFFERENTIAL/PLATELET
Abs Immature Granulocytes: 0.03 10*3/uL (ref 0.00–0.07)
Basophils Absolute: 0 10*3/uL (ref 0.0–0.1)
Basophils Relative: 0 %
Eosinophils Absolute: 0.2 10*3/uL (ref 0.0–0.5)
Eosinophils Relative: 2 %
HCT: 35 % — ABNORMAL LOW (ref 36.0–46.0)
Hemoglobin: 11.2 g/dL — ABNORMAL LOW (ref 12.0–15.0)
Immature Granulocytes: 0 %
Lymphocytes Relative: 39 %
Lymphs Abs: 2.6 10*3/uL (ref 0.7–4.0)
MCH: 28.6 pg (ref 26.0–34.0)
MCHC: 32 g/dL (ref 30.0–36.0)
MCV: 89.3 fL (ref 80.0–100.0)
Monocytes Absolute: 0.5 10*3/uL (ref 0.1–1.0)
Monocytes Relative: 7 %
Neutro Abs: 3.4 10*3/uL (ref 1.7–7.7)
Neutrophils Relative %: 52 %
Platelets: 321 10*3/uL (ref 150–400)
RBC: 3.92 MIL/uL (ref 3.87–5.11)
RDW: 13.2 % (ref 11.5–15.5)
WBC: 6.7 10*3/uL (ref 4.0–10.5)
nRBC: 0 % (ref 0.0–0.2)

## 2022-10-09 LAB — COMPREHENSIVE METABOLIC PANEL
ALT: 23 U/L (ref 0–44)
AST: 19 U/L (ref 15–41)
Albumin: 4.1 g/dL (ref 3.5–5.0)
Alkaline Phosphatase: 96 U/L (ref 38–126)
Anion gap: 9 (ref 5–15)
BUN: 11 mg/dL (ref 6–20)
CO2: 24 mmol/L (ref 22–32)
Calcium: 9.2 mg/dL (ref 8.9–10.3)
Chloride: 107 mmol/L (ref 98–111)
Creatinine, Ser: 0.78 mg/dL (ref 0.44–1.00)
GFR, Estimated: >60 mL/min (ref >60)
Glucose, Bld: 108 mg/dL — ABNORMAL HIGH (ref 70–99)
Potassium: 3.6 mmol/L (ref 3.5–5.1)
Sodium: 140 mmol/L (ref 135–145)
Total Bilirubin: 0.5 mg/dL (ref 0.3–1.2)
Total Protein: 7.5 g/dL (ref 6.5–8.1)

## 2022-10-09 NOTE — Progress Notes (Signed)
Patient denies new problems/concerns today.   

## 2022-10-09 NOTE — Progress Notes (Signed)
Survivorship Care Plan visit completed.  Treatment summary reviewed and given to patient.  ASCO answers booklet reviewed and given to patient.  CARE program and Cancer Transitions discussed with patient along with other resources cancer center offers to patients and caregivers.  Patient verbalized understanding.    

## 2022-10-09 NOTE — Assessment & Plan Note (Addendum)
#   DEC 2020- Clinical stage IIB triple negative right breast cancer: s/p neoadjuvant chemotherapy followed by right total mastectomy and sentinel lymph node biopsy-pathology complete pathologic response.  STABLE.   # Clinically no evidence of recurrence.  Reconstruction-right breast May 2022.  JUNE 2023- LEFT mammogram- WNL. stable  #Vaginal dryness/dyspareunia/lack of sexual drive-multifactorial-antidepressants plus-postchemotherapy menopause/vaginal dryness- on vaginal estrogen- monitor for now. stable  #  Normocytic anemia: S/p IV iron infusion- [feb 2021] ? OCT 2022- Hb11- 12.-stable.  Will check iron studies/ferritin B12 at next visit.  # Depression/anxiety-multiple psychiatric medication [Dr.Su; [facility]]; clinically stable.  stable             # B12 deficiency: B12 - high; on p.o. B12; recommend PO QD  # JUNE 2023- BMD-T-score of -1.7. Discussed the potential risk factors for osteoporosis- age/gender/postmenopausal status/use of chemotherapy. Discussed multiple options including exercise/ calcium and vitamin D supplementation/ and also use of bi-sphosphonates- IV or PO. For now recommend monitoring for now.  See below regarding exercise.  # Bil Knee pain-interfering with exercise.  Defer to PCP regarding further workup/referral to orthopedics.  # DISPOSITION: # follow up in 6 months- MD; labs- cbc/cmp;iron studies;ferritin; B12 ; ca-27-29; Left sided screening mammogram - Dr.B

## 2022-10-09 NOTE — Progress Notes (Signed)
[facility] NOTE  Patient Care Team: [place] as PCP - General End, Harrell Gave, MD as PCP - Cardiology (Cardiology) Cammie Sickle, MD as Consulting Physician (Oncology) Herbert Pun, MD as Consulting Physician (General Surgery)  CHIEF COMPLAINTS/PURPOSE OF CONSULTATION: Triple negative breast cancer  #  Oncology History Overview Note  #  Clinical stage IIB triple negative right breast cancer: s/p neoadjuvant chemotherapy followed by right total mastectomy and sentinel lymph node biopsy.             Pathology revealed a complete pathologic remission.             She is s/p removal of right tissue expander.             Clinically, she continues to do well.                 Exam reveals no evidence of recurrent disease.             Encourage monthly breast self-exam.             Left mammogram on 08/09/2020 revealed no evidence of malignancy.             She had a port removed by Dr. Su Monks is on 10/06/2020.              # She underwent left breast reduction and exchange of right breast tissue expander for gel implant with Dr. Claudia Desanctis on 06/27/2021.    Carcinoma of lower-outer quadrant of right breast in female, estrogen receptor negative ([place])  09/28/2019 Initial Diagnosis   Carcinoma of lower-outer quadrant of right breast in female, estrogen receptor negative ([place])      HISTORY OF PRESENTING ILLNESS: ALone.  Ambulating independently.  Heather Bell 47 y.o.  female history of triple negative breast cancer stage II currently on surveillance; history of iron deficiency anemia is here for follow-up/review results of bone density.  In the interim patient was evaluated by gynecology.-Started on vaginal estrogens for vaginal dryness.  She improved.  Complaints of chronic knee pain/knees giving out.  Otherwise patient denies any blood in stools or black or stools.  Denies any unusual shortness of breath or cough.  Denies any  swelling in the legs.  Review of Systems  Constitutional:  Positive for malaise/fatigue. Negative for chills, diaphoresis, fever and weight loss.  HENT:  Negative for nosebleeds and sore throat.   Eyes:  Negative for double vision.  Respiratory:  Negative for cough, hemoptysis, sputum production, shortness of breath and wheezing.   Cardiovascular:  Negative for chest pain, palpitations, orthopnea and leg swelling.  Gastrointestinal:  Negative for abdominal pain, blood in stool, constipation, diarrhea, heartburn, melena, nausea and vomiting.  Genitourinary:  Negative for dysuria, frequency and urgency.  Musculoskeletal:  Positive for joint pain.  Skin: Negative.  Negative for itching and rash.  Neurological:  Negative for dizziness, tingling, focal weakness, weakness and headaches.  Endo/Heme/Allergies:  Does not bruise/bleed easily.  Psychiatric/Behavioral:  Negative for depression. The patient is not nervous/anxious and does not have insomnia.      MEDICAL HISTORY:  Past Medical History:  Diagnosis Date   Allergic rhinitis    Anxiety    Bipolar disorder ([place])    depression   Breast cancer ([place]) 03/21/2020   Right mastectomy   Cancer ([place])    breast right   Depression    Diabetes ([place])    GERD (gastroesophageal reflux disease)    Hyperlipidemia  Personal history of chemotherapy    Jan 2021 through July 2021    SURGICAL HISTORY: Past Surgical History:  Procedure Laterality Date   BREAST BIOPSY Right 07/22/2019   mass bx, path pending, heart marker   BREAST BIOPSY Right 07/22/2019   LN bx, path pending,  butterfly hydromarker   BREAST RECONSTRUCTION WITH PLACEMENT OF TISSUE EXPANDER AND FLEX HD (ACELLULAR HYDRATED DERMIS) Right 03/21/2020   Procedure: RIGHT BREAST RECONSTRUCTION WITH PLACEMENT OF TISSUE EXPANDER AND FLEX HD (ACELLULAR HYDRATED DERMIS);  Surgeon: [name], MD;  Location: [facility];  Service: Plastics;  Laterality: Right;   BREAST RECONSTRUCTION WITH  PLACEMENT OF TISSUE EXPANDER AND FLEX HD (ACELLULAR HYDRATED DERMIS) Right 12/27/2020   Procedure: BREAST RECONSTRUCTION WITH PLACEMENT OF TISSUE EXPANDER AND FLEX HD (ACELLULAR HYDRATED DERMIS);  Surgeon: [name], MD;  Location: [facility];  Service: Plastics;  Laterality: Right;   BREAST REDUCTION SURGERY Left 06/27/2021   Procedure: Left breast reduction;  Surgeon: [name], MD;  Location: [facility];  Service: Plastics;  Laterality: Left;   IMAGE GUIDED SINUS SURGERY     INCISION AND DRAINAGE OF WOUND Right 05/02/2020   Procedure: IRRIGATION AND DEBRIDEMENT WOUND WITH REMOVAL OF RIGHT TISSUE EXPANDER;  Surgeon: [name], MD;  Location: [facility];  Service: Plastics;  Laterality: Right;  1 hour please   KNEE SURGERY     MASTECTOMY Right 03/21/2020   NASAL SEPTUM SURGERY     PORTACATH PLACEMENT N/A 08/07/2019   Procedure: INSERTION PORT-A-CATH;  Surgeon: Herbert Pun, MD;  Location: [facility];  Service: General;  Laterality: N/A;   REDUCTION MAMMAPLASTY     REMOVAL OF TISSUE EXPANDER AND PLACEMENT OF IMPLANT Right 06/27/2021   Procedure: Removal right breast tissue expander and placement of gel implant;  Surgeon: [name], MD;  Location: [facility];  Service: Plastics;  Laterality: Right;   TONSILLECTOMY     TOTAL MASTECTOMY Right 03/21/2020   Procedure: TOTAL MASTECTOMY;  Surgeon: Herbert Pun, MD;  Location: [facility];  Service: General;  Laterality: Right;    SOCIAL HISTORY: Social History   Socioeconomic History   Marital status: Married    Spouse name: Not on file   Number of children: Not on file   Years of education: Not on file   Highest education level: Not on file  Occupational History   Not on file  Tobacco Use   Smoking status: Never   Smokeless tobacco: Never  Vaping Use   Vaping Use: Never used  Substance and Sexual Activity   Alcohol use: No    Alcohol/week:  0.0 standard drinks of alcohol   Drug use: No   Sexual activity: Yes  Other Topics Concern   Not on file  Social History Narrative   Not on file   Social Determinants of Health   Financial Resource Strain: Not on file  Food Insecurity: Not on file  Transportation Needs: No Transportation Needs (01/19/2022)   PRAPARE - Hydrologist (Medical): No    Lack of Transportation (Non-Medical): No  Physical Activity: Not on file  Stress: Not on file  Social Connections: Not on file  Intimate Partner Violence: Not on file    FAMILY HISTORY: Family History  Problem Relation Age of Onset   Lung cancer Father    Heart attack Father    Lung cancer Paternal Aunt    Lung cancer Paternal Uncle  Heart disease Paternal Uncle    Breast cancer Neg Hx     ALLERGIES:  is allergic to compazine [prochlorperazine edisylate], morphine and related, famotidine, doxycycline, fluticasone, and levofloxacin.  MEDICATIONS:  Current Outpatient Medications  Medication Sig Dispense Refill   Accu-Chek FastClix Lancets MISC See admin instructions.     ACCU-CHEK GUIDE test strip SMARTSIG:1 Strip(s) Via Meter Daily PRN     amphetamine-dextroamphetamine (ADDERALL XR) 25 MG 24 hr capsule Take 25 mg by mouth daily.     Blood Glucose Monitoring Suppl (ACCU-CHEK GUIDE ME) w/Device KIT USE 1 DEVICE ONCE DAILY AS DIRECTED     buPROPion (WELLBUTRIN XL) 150 MG 24 hr tablet Take 150 mg by mouth every morning.     Calcium Carb-Cholecalciferol (CALCIUM 600+D) 600-800 MG-UNIT TABS Take 1 tablet by mouth daily.     cetirizine (ZYRTEC) 10 MG tablet Take 10 mg by mouth daily.      doxepin (SINEQUAN) 25 MG capsule Take 100 mg by mouth at bedtime.     estradiol (ESTRACE) 0.1 MG/GM vaginal cream Insert 1g nightly for 1 wk, then 1 g once weekly as maintenance; can apply pea sized amount externally at vaginal opening weekly 42.5 g 1   ibuprofen (ADVIL) 600 MG tablet Take 1 tablet (600 mg total) by mouth  every 6 (six) hours as needed. 30 tablet 0   lamoTRIgine (LAMICTAL) 150 MG tablet Take 150 mg by mouth daily.     lurasidone (LATUDA) 80 MG TABS tablet Take 80 mg by mouth daily.     metFORMIN (GLUCOPHAGE-XR) 500 MG 24 hr tablet Take 500 mg by mouth 2 (two) times daily.     mometasone (NASONEX) 50 MCG/ACT nasal spray Place 2 sprays into the nose daily as needed (allergies).      Multiple Vitamin (MULTIVITAMIN WITH MINERALS) TABS tablet Take 1 tablet by mouth daily.     omeprazole (PRILOSEC) 20 MG capsule Take 20 mg by mouth daily before breakfast.      QUEtiapine (SEROQUEL) 300 MG tablet Take 300 mg by mouth at bedtime.     simvastatin (ZOCOR) 20 MG tablet Take 20 mg by mouth daily.     topiramate (TOPAMAX) 100 MG tablet Take 50-100 mg by mouth See admin instructions. Take 1 tablet (100 mg) by mouth in the morning & take 0.5 tablet (50 mg) by mouth at night.     vitamin B-12 (CYANOCOBALAMIN) 1000 MCG tablet Take 1,000 mcg by mouth daily.     buPROPion (WELLBUTRIN SR) 200 MG 12 hr tablet Take 200 mg by mouth 2 (two) times daily. (Patient not taking: Reported on 10/09/2022)     diazepam (VALIUM) 10 MG tablet Take 10 mg by mouth. As needed prior to procedures (Patient not taking: Reported on 10/09/2022)     No current facility-administered medications for this visit.   Facility-Administered Medications Ordered in Other Visits  Medication Dose Route Frequency Provider Last Rate Last Admin   0.9 %  sodium chloride infusion   Intravenous Continuous Lequita Asal, MD   Stopped at 01/08/20 1516   sodium chloride flush (NS) 0.9 % injection 10 mL  10 mL Intravenous PRN Nolon Stalls C, MD   10 mL at 01/08/20 1314      .  PHYSICAL EXAMINATION: ECOG PERFORMANCE STATUS: 0 - Asymptomatic  Vitals:   10/09/22 1500  BP: 120/81  Pulse: 82  Resp: 16  Temp: (!) 97.3 F (36.3 C)   Filed Weights   10/09/22 1500  Weight: 160 lb (  72.6 kg)    Physical Exam Vitals and nursing note reviewed.   Constitutional:      Comments:       HENT:     Head: Normocephalic and atraumatic.     Mouth/Throat:     Pharynx: Oropharynx is clear.  Eyes:     Extraocular Movements: Extraocular movements intact.     Pupils: Pupils are equal, round, and reactive to light.  Cardiovascular:     Rate and Rhythm: Normal rate and regular rhythm.  Pulmonary:     Comments: Decreased breath sounds bilaterally.  Abdominal:     Palpations: Abdomen is soft.  Musculoskeletal:        General: Normal range of motion.     Cervical back: Normal range of motion.  Skin:    General: Skin is warm.  Neurological:     General: No focal deficit present.     Mental Status: She is alert and oriented to person, place, and time.  Psychiatric:        Behavior: Behavior normal.        Judgment: Judgment normal.     LABORATORY DATA:  I have reviewed the data as listed Lab Results  Component Value Date   WBC 6.7 10/09/2022   HGB 11.2 (L) 10/09/2022   HCT 35.0 (L) 10/09/2022   MCV 89.3 10/09/2022   PLT 321 10/09/2022   Recent Labs    12/19/21 1009 10/09/22 1507  NA 139 140  K 3.7 3.6  CL 108 107  CO2 23 24  GLUCOSE 108* 108*  BUN 12 11  CREATININE 0.84 0.78  CALCIUM 9.2 9.2  GFRNONAA >60 >60  PROT 7.5 7.5  ALBUMIN 3.8 4.1  AST 15 19  ALT 14 23  ALKPHOS 80 96  BILITOT 0.3 0.5    RADIOGRAPHIC STUDIES: I have personally reviewed the radiological images as listed and agreed with the findings in the report. No results found.  ASSESSMENT & PLAN:   Carcinoma of lower-outer quadrant of right breast in female, estrogen receptor negative ([place]) # DEC 2020- Clinical stage IIB triple negative right breast cancer: s/p neoadjuvant chemotherapy followed by right total mastectomy and sentinel lymph node biopsy-pathology complete pathologic response.  STABLE.   # Clinically no evidence of recurrence.  Reconstruction-right breast May 2022.  JUNE 2023- LEFT mammogram- WNL. stable  #Vaginal  dryness/dyspareunia/lack of sexual drive-multifactorial-antidepressants plus-postchemotherapy menopause/vaginal dryness- on vaginal estrogen- monitor for now. stable  #  Normocytic anemia: S/p IV iron infusion- [feb 2021] ? OCT 2022- Hb11- 12.-stable.  Will check iron studies/ferritin B12 at next visit.  # Depression/anxiety-multiple psychiatric medication [Dr.Su; [facility]]; clinically stable.  stable             # B12 deficiency: B12 - high; on p.o. B12; recommend PO QD  # JUNE 2023- BMD-T-score of -1.7. Discussed the potential risk factors for osteoporosis- age/gender/postmenopausal status/use of chemotherapy. Discussed multiple options including exercise/ calcium and vitamin D supplementation/ and also use of bi-sphosphonates- IV or PO. For now recommend monitoring for now.  See below regarding exercise.  # Bil Knee pain-interfering with exercise.  Defer to PCP regarding further workup/referral to orthopedics.  # DISPOSITION: # follow up in 6 months- MD; labs- cbc/cmp;iron studies;ferritin; B12 ; ca-27-29; Left sided screening mammogram - Dr.B   All questions were answered. The patient knows to call the clinic with any problems, questions or concerns.    Cammie Sickle, MD 10/09/2022 3:54 PM

## 2022-10-10 LAB — CANCER ANTIGEN 27.29: CA 27.29: <9 U/mL (ref 0.0–38.6)

## 2022-10-15 DIAGNOSIS — J029 Acute pharyngitis, unspecified: Secondary | ICD-10-CM | POA: Diagnosis not present

## 2022-10-15 DIAGNOSIS — Z712 Person consulting for explanation of examination or test findings: Secondary | ICD-10-CM | POA: Diagnosis not present

## 2022-10-15 DIAGNOSIS — Z1331 Encounter for screening for depression: Secondary | ICD-10-CM | POA: Diagnosis not present

## 2022-10-15 DIAGNOSIS — Z1389 Encounter for screening for other disorder: Secondary | ICD-10-CM | POA: Diagnosis not present

## 2022-11-07 DIAGNOSIS — F3181 Bipolar II disorder: Secondary | ICD-10-CM | POA: Diagnosis not present

## 2022-11-07 DIAGNOSIS — F900 Attention-deficit hyperactivity disorder, predominantly inattentive type: Secondary | ICD-10-CM | POA: Diagnosis not present

## 2022-11-13 DIAGNOSIS — J029 Acute pharyngitis, unspecified: Secondary | ICD-10-CM | POA: Diagnosis not present

## 2022-11-13 DIAGNOSIS — Z1389 Encounter for screening for other disorder: Secondary | ICD-10-CM | POA: Diagnosis not present

## 2022-12-07 DIAGNOSIS — N390 Urinary tract infection, site not specified: Secondary | ICD-10-CM | POA: Diagnosis not present

## 2022-12-11 DIAGNOSIS — R7309 Other abnormal glucose: Secondary | ICD-10-CM | POA: Diagnosis not present

## 2022-12-11 DIAGNOSIS — H93A3 Pulsatile tinnitus, bilateral: Secondary | ICD-10-CM | POA: Diagnosis not present

## 2022-12-11 DIAGNOSIS — Z1389 Encounter for screening for other disorder: Secondary | ICD-10-CM | POA: Diagnosis not present

## 2022-12-11 DIAGNOSIS — J029 Acute pharyngitis, unspecified: Secondary | ICD-10-CM | POA: Diagnosis not present

## 2022-12-11 DIAGNOSIS — N390 Urinary tract infection, site not specified: Secondary | ICD-10-CM | POA: Diagnosis not present

## 2022-12-11 DIAGNOSIS — E119 Type 2 diabetes mellitus without complications: Secondary | ICD-10-CM | POA: Diagnosis not present

## 2022-12-24 DIAGNOSIS — Z112 Encounter for screening for other bacterial diseases: Secondary | ICD-10-CM | POA: Diagnosis not present

## 2022-12-24 DIAGNOSIS — Z712 Person consulting for explanation of examination or test findings: Secondary | ICD-10-CM | POA: Diagnosis not present

## 2022-12-24 DIAGNOSIS — J029 Acute pharyngitis, unspecified: Secondary | ICD-10-CM | POA: Diagnosis not present

## 2022-12-24 DIAGNOSIS — Z1389 Encounter for screening for other disorder: Secondary | ICD-10-CM | POA: Diagnosis not present

## 2022-12-25 DIAGNOSIS — Z1389 Encounter for screening for other disorder: Secondary | ICD-10-CM | POA: Diagnosis not present

## 2022-12-25 DIAGNOSIS — N3000 Acute cystitis without hematuria: Secondary | ICD-10-CM | POA: Diagnosis not present

## 2022-12-25 DIAGNOSIS — Z712 Person consulting for explanation of examination or test findings: Secondary | ICD-10-CM | POA: Diagnosis not present

## 2023-01-09 ENCOUNTER — Ambulatory Visit: Payer: BC Managed Care – PPO | Admitting: Surgical

## 2023-01-11 ENCOUNTER — Ambulatory Visit (INDEPENDENT_AMBULATORY_CARE_PROVIDER_SITE_OTHER): Payer: BC Managed Care – PPO | Admitting: Surgical

## 2023-01-11 ENCOUNTER — Encounter: Payer: Self-pay | Admitting: Surgical

## 2023-01-11 VITALS — BP 108/71 | HR 98

## 2023-01-11 DIAGNOSIS — Z853 Personal history of malignant neoplasm of breast: Secondary | ICD-10-CM | POA: Diagnosis not present

## 2023-01-11 DIAGNOSIS — N6459 Other signs and symptoms in breast: Secondary | ICD-10-CM

## 2023-01-11 DIAGNOSIS — C50911 Malignant neoplasm of unspecified site of right female breast: Secondary | ICD-10-CM

## 2023-01-11 NOTE — Progress Notes (Signed)
Referring Provider [facility] [address] [place],  [place] [address]   CC:  Chief Complaint  Patient presents with   Follow-up      SUNNYE Bell is an 47 y.o. female.  HPI: Patient is a 47 year old female here for follow-up on her right breast reconstruction and left breast reduction for symmetry.  She underwent exchange of right breast tissue expander for gel implant, right breast capsulotomy superiorly and laterally, left breast reduction with superior medial pedicle with Dr. Arita Miss on 06/27/2021.  She is approximately 1 year and 6 months postop.  She had a Mentor moderate plus profile extra implant with 465 cc of volume placed in the right breast.  Patient reports that overall she is doing well, she has not had any issues and is overall very happy with her reconstruction at this time.  She does report that she has noticed the right reconstructed breast may feel firmer than it did before, but she is not sure if the firmness has always been present or if it is new.  Review of Systems General: No fevers or chills  Physical Exam    10/09/2022    3:00 PM 09/14/2022    2:49 PM 09/14/2022    2:28 PM  Vitals with BMI  Weight 160 lbs    Systolic 120  122  Diastolic 81  82  Pulse 82 82     General:  No acute distress,  Alert and oriented, Non-Toxic, Normal speech and affect Bilateral breast: Left breast NAC is viable, left breast reduction incisions are intact and well-healed.  Good shape and symmetry is noted of bilateral breast.  NAC is surgically absent on right breast.  Right breast implant is soft, I do not appreciate any capsular contracture at this time.  She does have some slight firmness of the capsule, however there is no tenderness or irregular shape changes.  She is likely a grade 1/grade 2 Baker scale.  Assessment/Plan 47 year old female status post right breast reconstruction for right breast cancer after mastectomy and left breast reduction for  symmetry.  She is 1 year and 6 months out from expander to implant exchange and left breast reduction for symmetry.  She is doing well.  She did state that she may have noticed some increased firmness on the right side where the implant is in place, but she is not sure.  She stated that she could be imagining the increased firmness.  On exam she did have some firmness of the right breast implant, discussed with her I do not feel as if it is capsular contracture at this time, but she does have a possible Baker grade 2 capsule.  Discussed with her that no recommended interventions are necessary at this time.  She is continuing to get mammograms in the left breast.  We discussed recommendations for imaging of the right breast around 3 years postoperatively to evaluate the implant integrity.  We discussed that if the right implant firmness worsens, please follow-up for evaluation.  Otherwise recommend follow-up in 1 year with surgeon to evaluate.  We discussed that her initial reconstructive surgeon is no longer here at this office and she was provided with information in regards to his current location.  She was appreciative of this.  She is aware that we will continue to follow-up with her if she would like to continue to follow with our practice, otherwise recommend reaching out to Dr. Thomos Lemons office for ongoing management.  Heather Bell 01/11/2023, 8:58 AM

## 2023-01-15 DIAGNOSIS — Z712 Person consulting for explanation of examination or test findings: Secondary | ICD-10-CM | POA: Diagnosis not present

## 2023-01-15 DIAGNOSIS — Z1389 Encounter for screening for other disorder: Secondary | ICD-10-CM | POA: Diagnosis not present

## 2023-01-15 DIAGNOSIS — N3000 Acute cystitis without hematuria: Secondary | ICD-10-CM | POA: Diagnosis not present

## 2023-02-04 ENCOUNTER — Encounter: Payer: Self-pay | Admitting: Radiology

## 2023-02-04 ENCOUNTER — Ambulatory Visit
Admission: RE | Admit: 2023-02-04 | Discharge: 2023-02-04 | Disposition: A | Discharge status: Home / Self Care | Payer: BC Managed Care – PPO | Source: Ambulatory Visit | Attending: Internal Medicine | Admitting: Internal Medicine

## 2023-02-04 DIAGNOSIS — C50511 Malignant neoplasm of lower-outer quadrant of right female breast: Secondary | ICD-10-CM | POA: Diagnosis not present

## 2023-02-04 DIAGNOSIS — Z171 Estrogen receptor negative status [ER-]: Secondary | ICD-10-CM | POA: Insufficient documentation

## 2023-02-04 DIAGNOSIS — Z1231 Encounter for screening mammogram for malignant neoplasm of breast: Secondary | ICD-10-CM | POA: Insufficient documentation

## 2023-02-06 DIAGNOSIS — H9313 Tinnitus, bilateral: Secondary | ICD-10-CM | POA: Diagnosis not present

## 2023-02-06 DIAGNOSIS — H903 Sensorineural hearing loss, bilateral: Secondary | ICD-10-CM | POA: Diagnosis not present

## 2023-02-15 ENCOUNTER — Ambulatory Visit: Payer: BC Managed Care – PPO | Attending: Physician Assistant

## 2023-02-15 DIAGNOSIS — Z9189 Other specified personal risk factors, not elsewhere classified: Secondary | ICD-10-CM

## 2023-02-15 DIAGNOSIS — I429 Cardiomyopathy, unspecified: Secondary | ICD-10-CM

## 2023-02-15 LAB — ECHOCARDIOGRAM COMPLETE
AR max vel: 2.36 cm2
AV Area VTI: 2.3 cm2
AV Area mean vel: 2.29 cm2
AV Mean grad: 4.5 mmHg
AV Peak grad: 8.2 mmHg
Ao pk vel: 1.44 m/s
Area-P 1/2: 3.85 cm2
Calc EF: 48.8 %
S' Lateral: 3.6 cm
Single Plane A2C EF: 49.5 %
Single Plane A4C EF: 49.9 %

## 2023-03-19 DIAGNOSIS — U071 COVID-19: Secondary | ICD-10-CM | POA: Diagnosis not present

## 2023-03-19 DIAGNOSIS — Z1389 Encounter for screening for other disorder: Secondary | ICD-10-CM | POA: Diagnosis not present

## 2023-03-27 DIAGNOSIS — N3000 Acute cystitis without hematuria: Secondary | ICD-10-CM | POA: Diagnosis not present

## 2023-03-27 DIAGNOSIS — Z1389 Encounter for screening for other disorder: Secondary | ICD-10-CM | POA: Diagnosis not present

## 2023-04-08 ENCOUNTER — Inpatient Hospital Stay: Payer: BC Managed Care – PPO | Admitting: Internal Medicine

## 2023-04-08 ENCOUNTER — Encounter: Payer: Self-pay | Admitting: Internal Medicine

## 2023-04-08 ENCOUNTER — Inpatient Hospital Stay: Payer: BC Managed Care – PPO | Attending: Internal Medicine

## 2023-04-08 VITALS — BP 104/71 | HR 95 | Temp 97.6°F | Ht 66.0 in | Wt 171.4 lb

## 2023-04-08 DIAGNOSIS — M25561 Pain in right knee: Secondary | ICD-10-CM | POA: Insufficient documentation

## 2023-04-08 DIAGNOSIS — E538 Deficiency of other specified B group vitamins: Secondary | ICD-10-CM | POA: Insufficient documentation

## 2023-04-08 DIAGNOSIS — F418 Other specified anxiety disorders: Secondary | ICD-10-CM | POA: Diagnosis not present

## 2023-04-08 DIAGNOSIS — C50511 Malignant neoplasm of lower-outer quadrant of right female breast: Secondary | ICD-10-CM | POA: Diagnosis not present

## 2023-04-08 DIAGNOSIS — Z9011 Acquired absence of right breast and nipple: Secondary | ICD-10-CM | POA: Insufficient documentation

## 2023-04-08 DIAGNOSIS — Z9221 Personal history of antineoplastic chemotherapy: Secondary | ICD-10-CM | POA: Insufficient documentation

## 2023-04-08 DIAGNOSIS — D649 Anemia, unspecified: Secondary | ICD-10-CM | POA: Insufficient documentation

## 2023-04-08 DIAGNOSIS — G8929 Other chronic pain: Secondary | ICD-10-CM | POA: Diagnosis not present

## 2023-04-08 DIAGNOSIS — Z171 Estrogen receptor negative status [ER-]: Secondary | ICD-10-CM

## 2023-04-08 DIAGNOSIS — Z853 Personal history of malignant neoplasm of breast: Secondary | ICD-10-CM | POA: Insufficient documentation

## 2023-04-08 DIAGNOSIS — Z801 Family history of malignant neoplasm of trachea, bronchus and lung: Secondary | ICD-10-CM | POA: Diagnosis not present

## 2023-04-08 DIAGNOSIS — M25562 Pain in left knee: Secondary | ICD-10-CM | POA: Insufficient documentation

## 2023-04-08 LAB — IRON AND TIBC
Iron: 56 ug/dL (ref 28–170)
Saturation Ratios: 16 % (ref 10.4–31.8)
TIBC: 349 ug/dL (ref 250–450)
UIBC: 293 ug/dL

## 2023-04-08 LAB — CMP (CANCER CENTER ONLY)
ALT: 33 U/L (ref 0–44)
AST: 27 U/L (ref 15–41)
Albumin: 4.1 g/dL (ref 3.5–5.0)
Alkaline Phosphatase: 87 U/L (ref 38–126)
Anion gap: 7 (ref 5–15)
BUN: 14 mg/dL (ref 6–20)
CO2: 22 mmol/L (ref 22–32)
Calcium: 8.8 mg/dL — ABNORMAL LOW (ref 8.9–10.3)
Chloride: 106 mmol/L (ref 98–111)
Creatinine: 0.94 mg/dL (ref 0.44–1.00)
GFR, Estimated: >60 mL/min (ref >60)
Glucose, Bld: 161 mg/dL — ABNORMAL HIGH (ref 70–99)
Potassium: 3.8 mmol/L (ref 3.5–5.1)
Sodium: 135 mmol/L (ref 135–145)
Total Bilirubin: 0.2 mg/dL — ABNORMAL LOW (ref 0.3–1.2)
Total Protein: 7.5 g/dL (ref 6.5–8.1)

## 2023-04-08 LAB — VITAMIN B12: Vitamin B-12: 991 pg/mL — ABNORMAL HIGH (ref 180–914)

## 2023-04-08 LAB — CBC WITH DIFFERENTIAL (CANCER CENTER ONLY)
Abs Immature Granulocytes: 0.06 10*3/uL (ref 0.00–0.07)
Basophils Absolute: 0 10*3/uL (ref 0.0–0.1)
Basophils Relative: 1 %
Eosinophils Absolute: 0.2 10*3/uL (ref 0.0–0.5)
Eosinophils Relative: 3 %
HCT: 37 % (ref 36.0–46.0)
Hemoglobin: 11.7 g/dL — ABNORMAL LOW (ref 12.0–15.0)
Immature Granulocytes: 1 %
Lymphocytes Relative: 28 %
Lymphs Abs: 1.8 10*3/uL (ref 0.7–4.0)
MCH: 28.5 pg (ref 26.0–34.0)
MCHC: 31.6 g/dL (ref 30.0–36.0)
MCV: 90.2 fL (ref 80.0–100.0)
Monocytes Absolute: 0.4 10*3/uL (ref 0.1–1.0)
Monocytes Relative: 6 %
Neutro Abs: 4.2 10*3/uL (ref 1.7–7.7)
Neutrophils Relative %: 61 %
Platelet Count: 360 10*3/uL (ref 150–400)
RBC: 4.1 MIL/uL (ref 3.87–5.11)
RDW: 13.3 % (ref 11.5–15.5)
WBC Count: 6.7 10*3/uL (ref 4.0–10.5)
nRBC: 0 % (ref 0.0–0.2)

## 2023-04-08 LAB — FERRITIN: Ferritin: 27 ng/mL (ref 11–307)

## 2023-04-08 NOTE — Progress Notes (Unsigned)
Has trouble sleeping, takes doxepin.  Has some trouble chewing due to missing teeth.  No other questions or concerns today.

## 2023-04-08 NOTE — Progress Notes (Unsigned)
[facility] CONSULT NOTE  Patient Care Team: [facility] as PCP - General End, Cristal Deer, MD as PCP - Cardiology (Cardiology) Earna Coder, MD as Consulting Physician (Oncology) Carolan Shiver, MD as Consulting Physician (General Surgery)  CHIEF COMPLAINTS/PURPOSE OF CONSULTATION: Triple negative breast cancer  #  Oncology History Overview Note  #  Clinical stage IIB triple negative right breast cancer: s/p neoadjuvant chemotherapy followed by right total mastectomy and sentinel lymph node biopsy.             Pathology revealed a complete pathologic remission.             She is s/p removal of right tissue expander.             Clinically, she continues to do well.                 Exam reveals no evidence of recurrent disease.             Encourage monthly breast self-exam.             Left mammogram on 08/09/2020 revealed no evidence of malignancy.             She had a port removed by Dr. Lynford Humphrey is on 10/06/2020.              # She underwent left breast reduction and exchange of right breast tissue expander for gel implant with Dr. Arita Miss on 06/27/2021.    Carcinoma of lower-outer quadrant of right breast in female, estrogen receptor negative (HCC)  09/28/2019 Initial Diagnosis   Carcinoma of lower-outer quadrant of right breast in female, estrogen receptor negative (HCC)      HISTORY OF PRESENTING ILLNESS: ALone.  Ambulating independently.  Heather Bell 47 y.o.  female history of triple negative breast cancer stage II currently on surveillance; history of iron deficiency anemia is here for follow-up.  as trouble sleeping, takes doxepin.   Has some trouble chewing due to missing teeth.   No other questions or concerns toda  In the interim patient was evaluated by gynecology.-Started on vaginal estrogens for vaginal dryness.  She improved.  Complaints of chronic knee pain/knees giving out.  Otherwise patient denies any  blood in stools or black or stools.  Denies any unusual shortness of breath or cough.  Denies any swelling in the legs.  Review of Systems  Constitutional:  Positive for malaise/fatigue. Negative for chills, diaphoresis, fever and weight loss.  HENT:  Negative for nosebleeds and sore throat.   Eyes:  Negative for double vision.  Respiratory:  Negative for cough, hemoptysis, sputum production, shortness of breath and wheezing.   Cardiovascular:  Negative for chest pain, palpitations, orthopnea and leg swelling.  Gastrointestinal:  Negative for abdominal pain, blood in stool, constipation, diarrhea, heartburn, melena, nausea and vomiting.  Genitourinary:  Negative for dysuria, frequency and urgency.  Musculoskeletal:  Positive for joint pain.  Skin: Negative.  Negative for itching and rash.  Neurological:  Negative for dizziness, tingling, focal weakness, weakness and headaches.  Endo/Heme/Allergies:  Does not bruise/bleed easily.  Psychiatric/Behavioral:  Negative for depression. The patient is not nervous/anxious and does not have insomnia.      MEDICAL HISTORY:  Past Medical History:  Diagnosis Date   Allergic rhinitis    Anxiety    Bipolar disorder (HCC)    depression   Breast cancer (HCC) 03/21/2020   Right mastectomy   Cancer (HCC)    breast right  Depression    Diabetes (HCC)    GERD (gastroesophageal reflux disease)    Hyperlipidemia    Personal history of chemotherapy    Jan 2021 through July 2021    SURGICAL HISTORY: Past Surgical History:  Procedure Laterality Date   BREAST BIOPSY Right 07/22/2019   mass bx, path pending, heart marker   BREAST BIOPSY Right 07/22/2019   LN bx, path pending,  butterfly hydromarker   BREAST RECONSTRUCTION WITH PLACEMENT OF TISSUE EXPANDER AND FLEX HD (ACELLULAR HYDRATED DERMIS) Right 03/21/2020   Procedure: RIGHT BREAST RECONSTRUCTION WITH PLACEMENT OF TISSUE EXPANDER AND FLEX HD (ACELLULAR HYDRATED DERMIS);  Surgeon: Allena Napoleon, MD;  Location: [facility];  Service: Plastics;  Laterality: Right;   BREAST RECONSTRUCTION WITH PLACEMENT OF TISSUE EXPANDER AND FLEX HD (ACELLULAR HYDRATED DERMIS) Right 12/27/2020   Procedure: BREAST RECONSTRUCTION WITH PLACEMENT OF TISSUE EXPANDER AND FLEX HD (ACELLULAR HYDRATED DERMIS);  Surgeon: Allena Napoleon, MD;  Location: [facility];  Service: Plastics;  Laterality: Right;   BREAST REDUCTION SURGERY Left 06/27/2021   Procedure: Left breast reduction;  Surgeon: Allena Napoleon, MD;  Location: [facility];  Service: Plastics;  Laterality: Left;   IMAGE GUIDED SINUS SURGERY     INCISION AND DRAINAGE OF WOUND Right 05/02/2020   Procedure: IRRIGATION AND DEBRIDEMENT WOUND WITH REMOVAL OF RIGHT TISSUE EXPANDER;  Surgeon: Allena Napoleon, MD;  Location: [facility];  Service: Plastics;  Laterality: Right;  1 hour please   KNEE SURGERY     MASTECTOMY Right 03/21/2020   NASAL SEPTUM SURGERY     PORTACATH PLACEMENT N/A 08/07/2019   Procedure: INSERTION PORT-A-CATH;  Surgeon: Carolan Shiver, MD;  Location: [facility];  Service: General;  Laterality: N/A;   REDUCTION MAMMAPLASTY     REMOVAL OF TISSUE EXPANDER AND PLACEMENT OF IMPLANT Right 06/27/2021   Procedure: Removal right breast tissue expander and placement of gel implant;  Surgeon: Allena Napoleon, MD;  Location: [facility];  Service: Plastics;  Laterality: Right;   TONSILLECTOMY     TOTAL MASTECTOMY Right 03/21/2020   Procedure: TOTAL MASTECTOMY;  Surgeon: Carolan Shiver, MD;  Location: [facility];  Service: General;  Laterality: Right;    SOCIAL HISTORY: Social History   Socioeconomic History   Marital status: Married    Spouse name: Not on file   Number of children: Not on file   Years of education: Not on file   Highest education level: Not on file  Occupational History   Not on file  Tobacco Use   Smoking status: Never   Smokeless tobacco: Never  Vaping  Use   Vaping status: Never Used  Substance and Sexual Activity   Alcohol use: No    Alcohol/week: 0.0 standard drinks of alcohol   Drug use: No   Sexual activity: Yes  Other Topics Concern   Not on file  Social History Narrative   Not on file   Social Determinants of Health   Financial Resource Strain: Not on file  Food Insecurity: Not on file  Transportation Needs: No Transportation Needs (01/19/2022)   PRAPARE - Administrator, Civil Service (Medical): No    Lack of Transportation (Non-Medical): No  Physical Activity: Not on file  Stress: Not on file  Social Connections: Not on file  Intimate Partner Violence: Not on file    FAMILY HISTORY: Family History  Problem Relation Age of Onset   Lung cancer Father  Heart attack Father    Lung cancer Paternal Aunt    Lung cancer Paternal Uncle    Heart disease Paternal Uncle    Breast cancer Neg Hx     ALLERGIES:  is allergic to compazine [prochlorperazine edisylate], morphine and codeine, famotidine, doxycycline, fluticasone, and levofloxacin.  MEDICATIONS:  Current Outpatient Medications  Medication Sig Dispense Refill   Accu-Chek FastClix Lancets MISC See admin instructions.     ACCU-CHEK GUIDE test strip SMARTSIG:1 Strip(s) Via Meter Daily PRN     amphetamine-dextroamphetamine (ADDERALL XR) 25 MG 24 hr capsule Take 25 mg by mouth daily.     Blood Glucose Monitoring Suppl (ACCU-CHEK GUIDE ME) w/Device KIT USE 1 DEVICE ONCE DAILY AS DIRECTED     Calcium Carb-Cholecalciferol (CALCIUM 600+D) 600-800 MG-UNIT TABS Take 1 tablet by mouth daily.     cetirizine (ZYRTEC) 10 MG tablet Take 10 mg by mouth daily.      diazepam (VALIUM) 10 MG tablet Take 10 mg by mouth. As needed prior to procedures     doxepin (SINEQUAN) 25 MG capsule Take 100 mg by mouth at bedtime.     estradiol (ESTRACE) 0.1 MG/GM vaginal cream Insert 1g nightly for 1 wk, then 1 g once weekly as maintenance; can apply pea sized amount externally at  vaginal opening weekly 42.5 g 1   ibuprofen (ADVIL) 600 MG tablet Take 1 tablet (600 mg total) by mouth every 6 (six) hours as needed. 30 tablet 0   lamoTRIgine (LAMICTAL) 150 MG tablet Take 150 mg by mouth daily.     lurasidone (LATUDA) 80 MG TABS tablet Take 80 mg by mouth daily.     metFORMIN (GLUCOPHAGE-XR) 500 MG 24 hr tablet Take 500 mg by mouth 2 (two) times daily.     mometasone (NASONEX) 50 MCG/ACT nasal spray Place 2 sprays into the nose daily as needed (allergies).      Multiple Vitamin (MULTIVITAMIN WITH MINERALS) TABS tablet Take 1 tablet by mouth daily.     omeprazole (PRILOSEC) 20 MG capsule Take 20 mg by mouth daily before breakfast.      QUEtiapine (SEROQUEL) 300 MG tablet Take 300 mg by mouth at bedtime.     simvastatin (ZOCOR) 20 MG tablet Take 20 mg by mouth daily.     topiramate (TOPAMAX) 100 MG tablet Take 50-100 mg by mouth See admin instructions. Take 1 tablet (100 mg) by mouth in the morning & take 0.5 tablet (50 mg) by mouth at night.     vitamin B-12 (CYANOCOBALAMIN) 1000 MCG tablet Take 1,000 mcg by mouth daily.     No current facility-administered medications for this visit.   Facility-Administered Medications Ordered in Other Visits  Medication Dose Route Frequency Provider Last Rate Last Admin   0.9 %  sodium chloride infusion   Intravenous Continuous Rosey Bath, MD   Stopped at 01/08/20 1516   sodium chloride flush (NS) 0.9 % injection 10 mL  10 mL Intravenous PRN Nelva Nay C, MD   10 mL at 01/08/20 1314      .  PHYSICAL EXAMINATION: ECOG PERFORMANCE STATUS: 0 - Asymptomatic  Vitals:   04/08/23 1512  BP: 104/71  Pulse: 95  Temp: 97.6 F (36.4 C)  SpO2: 100%    Filed Weights   04/08/23 1512  Weight: 171 lb 6.4 oz (77.7 kg)     Physical Exam Vitals and nursing note reviewed.  Constitutional:      Comments:       HENT:  Head: Normocephalic and atraumatic.     Mouth/Throat:     Pharynx: Oropharynx is clear.  Eyes:      Extraocular Movements: Extraocular movements intact.     Pupils: Pupils are equal, round, and reactive to light.  Cardiovascular:     Rate and Rhythm: Normal rate and regular rhythm.  Pulmonary:     Comments: Decreased breath sounds bilaterally.  Abdominal:     Palpations: Abdomen is soft.  Musculoskeletal:        General: Normal range of motion.     Cervical back: Normal range of motion.  Skin:    General: Skin is warm.  Neurological:     General: No focal deficit present.     Mental Status: She is alert and oriented to person, place, and time.  Psychiatric:        Behavior: Behavior normal.        Judgment: Judgment normal.     LABORATORY DATA:  I have reviewed the data as listed Lab Results  Component Value Date   WBC 6.7 04/08/2023   HGB 11.7 (L) 04/08/2023   HCT 37.0 04/08/2023   MCV 90.2 04/08/2023   PLT 360 04/08/2023   Recent Labs    10/09/22 1507 04/08/23 1513  NA 140 135  K 3.6 3.8  CL 107 106  CO2 24 22  GLUCOSE 108* 161*  BUN 11 14  CREATININE 0.78 0.94  CALCIUM 9.2 8.8*  GFRNONAA >60 >60  PROT 7.5 7.5  ALBUMIN 4.1 4.1  AST 19 27  ALT 23 33  ALKPHOS 96 87  BILITOT 0.5 0.2*    RADIOGRAPHIC STUDIES: I have personally reviewed the radiological images as listed and agreed with the findings in the report. No results found.  ASSESSMENT & PLAN:   Carcinoma of lower-outer quadrant of right breast in female, estrogen receptor negative (HCC) # DEC 2020- Clinical stage IIB triple negative right breast cancer: s/p neoadjuvant chemotherapy followed by right total mastectomy and sentinel lymph node biopsy-pathology complete pathologic response. Stable.    # Clinically no evidence of recurrence.  Reconstruction-right breast May 2022.  JUNE 2024- LEFT mammogram- WNL. stable  #  Normocytic anemia: S/p IV iron infusion- [feb 2021] ? OCT 2022- Hb 11- 12.-. Awaiting on iron studies/ferritin B12.   # Depression/anxiety-multiple psychiatric medication  [Dr.Su; [facility]]; clinically stable.  stable             # B12 deficiency: B12 - high; on p.o. B12; recommend PO QD  # JUNE 2023- BMD-T-score of -1.7. Discussed the potential risk factors for osteoporosis- age/gender/postmenopausal status/use of chemotherapy. Discussed multiple options including exercise/ calcium and vitamin D supplementation/ and also use of bi-sphosphonates- IV or PO. For now recommend monitoring for now.   # Bil Knee pain-interfering with exercise.  Defer to PCP regarding further workup/referral to orthopedics.  # cancer screening: discussed re: colonscopy screeing; declines GI referral; will speak to her PCP.   # DISPOSITION: # follow up in 12 months- MD; labs- cbc/cmp;iron studies;ferritin; B12 ; ca-27-29; june 2024- Left sided screening mammogram; BMD- Dr.B    All questions were answered. The patient knows to call the clinic with any problems, questions or concerns.    Earna Coder, MD 04/08/2023 4:03 PM

## 2023-04-08 NOTE — Assessment & Plan Note (Addendum)
#   DEC 2020- Clinical stage IIB triple negative right breast cancer: s/p neoadjuvant chemotherapy followed by right total mastectomy and sentinel lymph node biopsy-pathology complete pathologic response. Stable.    # Clinically no evidence of recurrence.  Reconstruction-right breast May 2022.  JUNE 2024- LEFT mammogram- WNL. stable  #  Normocytic anemia: S/p IV iron infusion- [feb 2021] ? OCT 2022- Hb 11- 12.-. Awaiting on iron studies/ferritin B12.   # Depression/anxiety-multiple psychiatric medication [Dr.Su; [facility]]; clinically stable.  stable             # B12 deficiency: B12 - high; on p.o. B12; recommend PO QD  # JUNE 2023- BMD-T-score of -1.7. Discussed the potential risk factors for osteoporosis- age/gender/postmenopausal status/use of chemotherapy. Discussed multiple options including exercise/ calcium and vitamin D supplementation/ and also use of bi-sphosphonates- IV or PO. For now recommend monitoring for now.   # Bil Knee pain-interfering with exercise.  Defer to PCP regarding further workup/referral to orthopedics.  # cancer screening: discussed re: colonscopy screeing; declines GI referral; will speak to her PCP.   # DISPOSITION: # follow up in 12 months- MD; labs- cbc/cmp;iron studies;ferritin; B12 ; ca-27-29; june 2024- Left sided screening mammogram; BMD- Dr.B

## 2023-04-08 NOTE — Progress Notes (Unsigned)
Cardiology Office Note    Date:  04/09/2023   ID:  Heather, Bell November 08, 1975, MRN [id number]  PCP:  [facility]  Cardiologist:  Yvonne Kendall, MD  Electrophysiologist:  None   Chief Complaint: Follow up  History of Present Illness:   Heather Bell is a 47 y.o. female with history of mitral regurgitation, breast cancer status post neoadjuvant chemotherapy followed by right total mastectomy with reconstruction, DM2, HLD, normocytic anemia, and depression who presents for follow-up of echo.   Prior echo in 07/2019 demonstrated an EF of 50 to 55%, global hypokinesis, normal RV systolic function and ventricular cavity size, trivial mitral regurgitation, and an estimated right atrial pressure of 3 mmHg.  Due to suboptimal images, we have been following her EF with serial MUGA scans.  Most recent MUGA scan on 08/16/2021 demonstrated an EF of 53% which was stable when compared to study in 01/2021, though was slightly down when compared to study from 01/2020.  She underwent removal of right breast tissue expander and replacement of gel implant as well as breast reduction in 06/2021.  Echo on 02/14/2022 demonstrated an EF of 50 to 55%, no regional wall motion abnormalities, normal LV diastolic function parameters, normal RV systolic function and ventricular cavity size, trivial mitral regurgitation, and an estimated right atrial pressure of 3 mmHg.  She was last seen in the visit in 03/2022 and remained without symptoms of angina or cardiac decompensation.  Echo in 01/2023 demonstrated an EF of 50 to 55%, no regional wall motion abnormalities, grade 1 diastolic dysfunction, normal RV systolic function and ventricular cavity size, no significant valvular abnormalities, and an estimated right atrial pressure of 3 mmHg.  She comes in doing well from a cardiac perspective and is without symptoms of angina or cardiac decompensation.  No dizziness, presyncope, or syncope.  No palpitations or  progressive dyspnea.  No lower extremity swelling or progressive orthopnea.  No acute cardiac concerns at this time.  However, she does note an increase in cognitive decline and forgetfulness since her last visit.  She indicates she can be midsentence and forget what she is talking about.  She will leave herself notes, though not recall why she left herself that note.  She is frequently misplacing things.   Labs independently reviewed: 09/2022 - Hgb 11.2, PLT 321, potassium 3.6, BUN 11, serum creatinine 0.78, albumin 4.1, AST/ALT normal 02/2020 - magnesium 1.8 09/2019 - TSH normal  Past Medical History:  Diagnosis Date   Allergic rhinitis    Anxiety    Bipolar disorder (HCC)    depression   Breast cancer (HCC) 03/21/2020   Right mastectomy   Cancer (HCC)    breast right   Depression    Diabetes (HCC)    GERD (gastroesophageal reflux disease)    Hyperlipidemia    Personal history of chemotherapy    Jan 2021 through July 2021    Past Surgical History:  Procedure Laterality Date   BREAST BIOPSY Right 07/22/2019   mass bx, path pending, heart marker   BREAST BIOPSY Right 07/22/2019   LN bx, path pending,  butterfly hydromarker   BREAST RECONSTRUCTION WITH PLACEMENT OF TISSUE EXPANDER AND FLEX HD (ACELLULAR HYDRATED DERMIS) Right 03/21/2020   Procedure: RIGHT BREAST RECONSTRUCTION WITH PLACEMENT OF TISSUE EXPANDER AND FLEX HD (ACELLULAR HYDRATED DERMIS);  Surgeon: Allena Napoleon, MD;  Location: [facility];  Service: Plastics;  Laterality: Right;   BREAST RECONSTRUCTION WITH PLACEMENT OF TISSUE EXPANDER AND FLEX HD (  ACELLULAR HYDRATED DERMIS) Right 12/27/2020   Procedure: BREAST RECONSTRUCTION WITH PLACEMENT OF TISSUE EXPANDER AND FLEX HD (ACELLULAR HYDRATED DERMIS);  Surgeon: Allena Napoleon, MD;  Location: [facility];  Service: Plastics;  Laterality: Right;   BREAST REDUCTION SURGERY Left 06/27/2021   Procedure: Left breast reduction;  Surgeon: Allena Napoleon, MD;   Location: [facility];  Service: Plastics;  Laterality: Left;   IMAGE GUIDED SINUS SURGERY     INCISION AND DRAINAGE OF WOUND Right 05/02/2020   Procedure: IRRIGATION AND DEBRIDEMENT WOUND WITH REMOVAL OF RIGHT TISSUE EXPANDER;  Surgeon: Allena Napoleon, MD;  Location: [facility];  Service: Plastics;  Laterality: Right;  1 hour please   KNEE SURGERY     MASTECTOMY Right 03/21/2020   NASAL SEPTUM SURGERY     PORTACATH PLACEMENT N/A 08/07/2019   Procedure: INSERTION PORT-A-CATH;  Surgeon: Carolan Shiver, MD;  Location: [facility];  Service: General;  Laterality: N/A;   REDUCTION MAMMAPLASTY     REMOVAL OF TISSUE EXPANDER AND PLACEMENT OF IMPLANT Right 06/27/2021   Procedure: Removal right breast tissue expander and placement of gel implant;  Surgeon: Allena Napoleon, MD;  Location: [facility];  Service: Plastics;  Laterality: Right;   TONSILLECTOMY     TOTAL MASTECTOMY Right 03/21/2020   Procedure: TOTAL MASTECTOMY;  Surgeon: Carolan Shiver, MD;  Location: [facility];  Service: General;  Laterality: Right;    Current Medications: Current Meds  Medication Sig   Accu-Chek FastClix Lancets MISC See admin instructions.   ACCU-CHEK GUIDE test strip SMARTSIG:1 Strip(s) Via Meter Daily PRN   amphetamine-dextroamphetamine (ADDERALL XR) 25 MG 24 hr capsule Take 25 mg by mouth daily.   Blood Glucose Monitoring Suppl (ACCU-CHEK GUIDE ME) w/Device KIT USE 1 DEVICE ONCE DAILY AS DIRECTED   Calcium Carb-Cholecalciferol (CALCIUM 600+D) 600-800 MG-UNIT TABS Take 1 tablet by mouth daily.   cetirizine (ZYRTEC) 10 MG tablet Take 10 mg by mouth daily.    diazepam (VALIUM) 10 MG tablet Take 10 mg by mouth. As needed prior to procedures   doxepin (SINEQUAN) 25 MG capsule Take 100 mg by mouth at bedtime.   estradiol (ESTRACE) 0.1 MG/GM vaginal cream Insert 1g nightly for 1 wk, then 1 g once weekly as maintenance; can apply pea sized amount externally at  vaginal opening weekly   ibuprofen (ADVIL) 600 MG tablet Take 1 tablet (600 mg total) by mouth every 6 (six) hours as needed.   lamoTRIgine (LAMICTAL) 150 MG tablet Take 150 mg by mouth daily.   lurasidone (LATUDA) 80 MG TABS tablet Take 80 mg by mouth daily.   metFORMIN (GLUCOPHAGE-XR) 500 MG 24 hr tablet Take 500 mg by mouth 2 (two) times daily.   mometasone (NASONEX) 50 MCG/ACT nasal spray Place 2 sprays into the nose daily as needed (allergies).    Multiple Vitamin (MULTIVITAMIN WITH MINERALS) TABS tablet Take 1 tablet by mouth daily.   omeprazole (PRILOSEC) 20 MG capsule Take 20 mg by mouth daily before breakfast.    QUEtiapine (SEROQUEL) 300 MG tablet Take 300 mg by mouth at bedtime.   simvastatin (ZOCOR) 20 MG tablet Take 20 mg by mouth daily.   topiramate (TOPAMAX) 100 MG tablet Take 50-100 mg by mouth See admin instructions. Take 1 tablet (100 mg) by mouth in the morning & take 0.5 tablet (50 mg) by mouth at night.   vitamin B-12 (CYANOCOBALAMIN) 1000 MCG tablet Take 1,000 mcg by mouth daily.    Allergies:  Compazine [prochlorperazine edisylate], Morphine and codeine, Famotidine, Doxycycline, Fluticasone, and Levofloxacin   Social History   Socioeconomic History   Marital status: Married    Spouse name: Not on file   Number of children: Not on file   Years of education: Not on file   Highest education level: Not on file  Occupational History   Not on file  Tobacco Use   Smoking status: Never   Smokeless tobacco: Never  Vaping Use   Vaping status: Never Used  Substance and Sexual Activity   Alcohol use: No    Alcohol/week: 0.0 standard drinks of alcohol   Drug use: No   Sexual activity: Yes  Other Topics Concern   Not on file  Social History Narrative   Not on file   Social Determinants of Health   Financial Resource Strain: Not on file  Food Insecurity: Not on file  Transportation Needs: No Transportation Needs (01/19/2022)   PRAPARE - Transportation    Lack of  Transportation (Medical): No    Lack of Transportation (Non-Medical): No  Physical Activity: Not on file  Stress: Not on file  Social Connections: Not on file     Family History:  The patient's family history includes Heart attack in her father; Heart disease in her paternal uncle; Lung cancer in her father, paternal aunt, and paternal uncle. There is no history of Breast cancer.  ROS:   12-point review of systems is negative unless otherwise noted in the HPI.   EKGs/Labs/Other Studies Reviewed:    Studies reviewed were summarized above. The additional studies were reviewed today:   2D echo 02/15/2023: 1. Left ventricular ejection fraction, by estimation, is 50 to 55%. The  left ventricle has low normal function. The left ventricle has no regional  wall motion abnormalities. Left ventricular diastolic parameters are  consistent with Grade I diastolic  dysfunction (impaired relaxation). The average left ventricular global  longitudinal strain is -17.1 %. The global longitudinal strain is normal.   2. Right ventricular systolic function is normal. The right ventricular  size is normal.   3. The mitral valve is normal in structure. No evidence of mitral valve  regurgitation.   4. The aortic valve is tricuspid. Aortic valve regurgitation is not  visualized.   5. The inferior vena cava is normal in size with greater than 50%  respiratory variability, suggesting right atrial pressure of 3 mmHg.  __________  2D echo 02/14/2022: 1. Left ventricular ejection fraction, by estimation, is 50 to 55%. The  left ventricle has low normal function. The left ventricle has no regional  wall motion abnormalities. Left ventricular diastolic parameters were  normal.   2. Right ventricular systolic function is normal. The right ventricular  size is normal.   3. The mitral valve is normal in structure. Trivial mitral valve  regurgitation.   4. The aortic valve is tricuspid. Aortic valve  regurgitation is not  visualized.   5. The inferior vena cava is normal in size with greater than 50%  respiratory variability, suggesting right atrial pressure of 3 mmHg. __________   2D echo 08/13/2019: 1. Left ventricular ejection fraction, by visual estimation, is 50 to  55%. The left ventricle has normal function. There is no left ventricular  hypertrophy.   2. The left ventricle demonstrates global hypokinesis.   3. Global right ventricle has normal systolic function.The right  ventricular size is normal. No increase in right ventricular wall  thickness.   4. Left atrial size was normal.  5. Right atrial size was normal.   6. The mitral valve is normal in structure. Trivial mitral valve  regurgitation. No evidence of mitral stenosis.   7. The tricuspid valve is normal in structure.   8. The aortic valve is normal in structure. Aortic valve regurgitation is  not visualized. No evidence of aortic valve sclerosis or stenosis.   9. The pulmonic valve was normal in structure. Pulmonic valve  regurgitation is not visualized.  10. The inferior vena cava is normal in size with greater than 50%  respiratory variability, suggesting right atrial pressure of 3 mmHg.   EKG:  EKG is ordered today.  The EKG ordered today demonstrates NSR, 90 bpm, incomplete RBBB, consistent with prior tracing  Recent Labs: 04/08/2023: ALT 33; BUN 14; Creatinine 0.94; Hemoglobin 11.7; Platelet Count 360; Potassium 3.8; Sodium 135  Recent Lipid Panel No results found for: "CHOL", "TRIG", "HDL", "CHOLHDL", "VLDL", "LDLCALC", "LDLDIRECT"  PHYSICAL EXAM:    VS:  BP 110/78 (BP Location: Left Arm, Patient Position: Sitting, Cuff Size: Normal)   Pulse 90   Ht 5\' 6"  (1.676 m)   Wt 167 lb 12.8 oz (76.1 kg)   LMP 01/19/2019 (Approximate) Comment: "Over 2 years"  SpO2 98%   BMI 27.08 kg/m   BMI: Body mass index is 27.08 kg/m.  Physical Exam Vitals reviewed.  Constitutional:      Appearance: She is  well-developed.  HENT:     Head: Normocephalic and atraumatic.  Eyes:     General:        Right eye: No discharge.        Left eye: No discharge.  Neck:     Vascular: No JVD.  Cardiovascular:     Rate and Rhythm: Normal rate and regular rhythm.     Pulses:          Posterior tibial pulses are 2+ on the right side and 2+ on the left side.     Heart sounds: Normal heart sounds, S1 normal and S2 normal. Heart sounds not distant. No midsystolic click and no opening snap. No murmur heard.    No friction rub.  Pulmonary:     Effort: Pulmonary effort is normal. No respiratory distress.     Breath sounds: Normal breath sounds. No decreased breath sounds, wheezing or rales.  Chest:     Chest wall: No tenderness.  Abdominal:     General: There is no distension.  Musculoskeletal:     Cervical back: Normal range of motion.     Right lower leg: No edema.     Left lower leg: No edema.  Skin:    General: Skin is warm and dry.     Nails: There is no clubbing.  Neurological:     Mental Status: She is alert and oriented to person, place, and time.  Psychiatric:        Speech: Speech normal.        Behavior: Behavior normal.        Thought Content: Thought content normal.        Judgment: Judgment normal.     Wt Readings from Last 3 Encounters:  04/09/23 167 lb 12.8 oz (76.1 kg)  04/08/23 171 lb 6.4 oz (77.7 kg)  10/09/22 160 lb (72.6 kg)     ASSESSMENT & PLAN:   Breast cancer with abnormal echo and at risk for cardiomyopathy: She continues to do well from a cardiac perspective, and is without symptoms of angina or decompensation. She has  completed treatment of her breast cancer with initial concern for mildly reduced LVEF by echo in 2020 as outlined above. Subsequent MUGA scans have demonstrated stable, low normal LV systolic function. Most recent echo from 01/2023 showed a stable, low normal LVEF of 50 to 55% with no evidence of mitral regurgitation.  Given she is asymptomatic, and in  the context of her LV systolic function remaining stable and low normal, with relative hypotension, we will defer addition of beta-blocker or ARB at this time in an effort to minimize significant hypotension. Defer ischemic testing given lack of anginal symptoms with stable EF. Should she become symptomatic, or have new decline in her EF down the road, we would need to consider ischemic evaluation.  Repeat echo in 01/2024.  Mitral regurgitation: No significant valvular abnormality noted on echo in 01/2023.  Cognitive decline/forgetfulness: Referred to neurology.    Disposition: F/u with Dr. Okey Dupre or an APP in 12 months.   Medication Adjustments/Labs and Tests Ordered: Current medicines are reviewed at length with the patient today.  Concerns regarding medicines are outlined above. Medication changes, Labs and Tests ordered today are summarized above and listed in the Patient Instructions accessible in Encounters.   Signed, Eula Listen, PA-C 04/09/2023 10:57 AM     [facility] -  [address] [place], [place] [address] [phone]

## 2023-04-09 ENCOUNTER — Encounter: Payer: Self-pay | Admitting: Hematology and Oncology

## 2023-04-09 ENCOUNTER — Ambulatory Visit: Payer: BC Managed Care – PPO | Attending: Physician Assistant | Admitting: Physician Assistant

## 2023-04-09 ENCOUNTER — Encounter: Payer: Self-pay | Admitting: Physician Assistant

## 2023-04-09 VITALS — BP 110/78 | HR 90 | Ht 66.0 in | Wt 167.8 lb

## 2023-04-09 DIAGNOSIS — R4189 Other symptoms and signs involving cognitive functions and awareness: Secondary | ICD-10-CM

## 2023-04-09 DIAGNOSIS — I340 Nonrheumatic mitral (valve) insufficiency: Secondary | ICD-10-CM

## 2023-04-09 DIAGNOSIS — Z9189 Other specified personal risk factors, not elsewhere classified: Secondary | ICD-10-CM

## 2023-04-09 LAB — CANCER ANTIGEN 27.29: CA 27.29: 13.6 U/mL (ref 0.0–38.6)

## 2023-04-09 NOTE — Patient Instructions (Signed)
Medication Instructions:  Your physician recommends that you continue on your current medications as directed. Please refer to the Current Medication list given to you today.   *If you need a refill on your cardiac medications before your next appointment, please call your pharmacy*   Lab Work: No labs ordered today    Testing/Procedures: Your physician has requested that you have an echocardiogram in June, 2025. Echocardiography is a painless test that uses sound waves to create images of your heart. It provides your doctor with information about the size and shape of your heart and how well your heart's chambers and valves are working.   You may receive an ultrasound enhancing agent through an IV if needed to better visualize your heart during the echo. This procedure takes approximately one hour.  There are no restrictions for this procedure.  This will take place at [address] ([facility] [address], [place] [address]    Follow-Up: At [facility], you and your health needs are our priority.  As part of our continuing mission to provide you with exceptional heart care, we have created designated Provider Care Teams.  These Care Teams include your primary Cardiologist (physician) and Advanced Practice Providers (APPs -  Physician Assistants and Nurse Practitioners) who all work together to provide you with the care you need, when you need it.  We recommend signing up for the patient portal called "MyChart".  Sign up information is provided on this After Visit Summary.  MyChart is used to connect with patients for Virtual Visits (Telemedicine).  Patients are able to view lab/test results, encounter notes, upcoming appointments, etc.  Non-urgent messages can be sent to your provider as well.   To learn more about what you can do with MyChart, go to [email or web address].    Your next appointment:   1 year(s)  Provider:   You may see Yvonne Kendall, MD or one  of the following Advanced Practice Providers on your designated Care Team:   Nicolasa Ducking, NP Eula Listen, PA-C Cadence Fransico Michael, PA-C Charlsie Quest, NP

## 2023-04-23 NOTE — Progress Notes (Signed)
GYNECOLOGY PROGRESS NOTE  Subjective:    Heather Bell is a 47 y.o. [id number] postmenopausal female who presents for evaluation of a prolapse. Problem started 5 days ago. Notes that she's had a lot of UTI's recently.  Was seen in the office about 1 year ago by [employer], PA-C due to complaints of vaginal dryness after going through chemotherapy for breast cancer.  She was prescribed local estrogen therapy at that time.  Reports that she has been inconsistent with use until more recently.  Notes that last Friday she went to use the applicator and upon removal of the applicator noted that she felt tissue protruding from her vagina.  She states that it has partially resolved without any other interventions.  Has not utilized the applicators again since that time.   Does admit to a history of irregular bowel movements all her life, notes having a bowel movement every 3 to 4 days is normal for her.  Does sometimes have to strain.  Reports that her PCP has encouraged her to utilize either fiber or stool softeners.   Menstrual History: OB History     Gravida  2   Para  2   Term  1   Preterm  1   AB      Living  2      SAB      IAB      Ectopic      Multiple      Live Births  2           Patient's last menstrual period was 01/19/2019 (approximate).     Past Medical History:  Diagnosis Date   Allergic rhinitis    Anxiety    Bipolar disorder (HCC)    depression   Breast cancer (HCC) 03/21/2020   Right mastectomy   Cancer (HCC)    breast right   Depression    Diabetes (HCC)    GERD (gastroesophageal reflux disease)    Hyperlipidemia    Personal history of chemotherapy    Jan 2021 through July 2021    Family History  Problem Relation Age of Onset   Lung cancer Father    Heart attack Father    Lung cancer Paternal Aunt    Lung cancer Paternal Uncle    Heart disease Paternal Uncle    Breast cancer Neg Hx     Past Surgical History:  Procedure Laterality  Date   BREAST BIOPSY Right 07/22/2019   mass bx, path pending, heart marker   BREAST BIOPSY Right 07/22/2019   LN bx, path pending,  butterfly hydromarker   BREAST RECONSTRUCTION WITH PLACEMENT OF TISSUE EXPANDER AND FLEX HD (ACELLULAR HYDRATED DERMIS) Right 03/21/2020   Procedure: RIGHT BREAST RECONSTRUCTION WITH PLACEMENT OF TISSUE EXPANDER AND FLEX HD (ACELLULAR HYDRATED DERMIS);  Surgeon: Allena Napoleon, MD;  Location: [facility];  Service: Plastics;  Laterality: Right;   BREAST RECONSTRUCTION WITH PLACEMENT OF TISSUE EXPANDER AND FLEX HD (ACELLULAR HYDRATED DERMIS) Right 12/27/2020   Procedure: BREAST RECONSTRUCTION WITH PLACEMENT OF TISSUE EXPANDER AND FLEX HD (ACELLULAR HYDRATED DERMIS);  Surgeon: Allena Napoleon, MD;  Location: [facility];  Service: Plastics;  Laterality: Right;   BREAST REDUCTION SURGERY Left 06/27/2021   Procedure: Left breast reduction;  Surgeon: Allena Napoleon, MD;  Location: [facility];  Service: Plastics;  Laterality: Left;   IMAGE GUIDED SINUS SURGERY     INCISION AND DRAINAGE OF WOUND Right 05/02/2020   Procedure:  IRRIGATION AND DEBRIDEMENT WOUND WITH REMOVAL OF RIGHT TISSUE EXPANDER;  Surgeon: Allena Napoleon, MD;  Location: [facility];  Service: Plastics;  Laterality: Right;  1 hour please   KNEE SURGERY     MASTECTOMY Right 03/21/2020   NASAL SEPTUM SURGERY     PORTACATH PLACEMENT N/A 08/07/2019   Procedure: INSERTION PORT-A-CATH;  Surgeon: Carolan Shiver, MD;  Location: [facility];  Service: General;  Laterality: N/A;   REDUCTION MAMMAPLASTY     REMOVAL OF TISSUE EXPANDER AND PLACEMENT OF IMPLANT Right 06/27/2021   Procedure: Removal right breast tissue expander and placement of gel implant;  Surgeon: Allena Napoleon, MD;  Location: [facility];  Service: Plastics;  Laterality: Right;   TONSILLECTOMY     TOTAL MASTECTOMY Right 03/21/2020   Procedure: TOTAL MASTECTOMY;  Surgeon: Carolan Shiver, MD;  Location: [facility];  Service: General;  Laterality: Right;    Social History   Socioeconomic History   Marital status: Married    Spouse name: Not on file   Number of children: Not on file   Years of education: Not on file   Highest education level: Not on file  Occupational History   Not on file  Tobacco Use   Smoking status: Never   Smokeless tobacco: Never  Vaping Use   Vaping status: Never Used  Substance and Sexual Activity   Alcohol use: No    Alcohol/week: 0.0 standard drinks of alcohol   Drug use: No   Sexual activity: Yes  Other Topics Concern   Not on file  Social History Narrative   Not on file   Social Determinants of Health   Financial Resource Strain: Not on file  Food Insecurity: Not on file  Transportation Needs: No Transportation Needs (01/19/2022)   PRAPARE - Administrator, Civil Service (Medical): No    Lack of Transportation (Non-Medical): No  Physical Activity: Not on file  Stress: Not on file  Social Connections: Not on file  Intimate Partner Violence: Not on file    Current Outpatient Medications on File Prior to Visit  Medication Sig Dispense Refill   Accu-Chek FastClix Lancets MISC See admin instructions.     ACCU-CHEK GUIDE test strip SMARTSIG:1 Strip(s) Via Meter Daily PRN     amphetamine-dextroamphetamine (ADDERALL XR) 25 MG 24 hr capsule Take 25 mg by mouth daily.     Blood Glucose Monitoring Suppl (ACCU-CHEK GUIDE ME) w/Device KIT USE 1 DEVICE ONCE DAILY AS DIRECTED     Calcium Carb-Cholecalciferol (CALCIUM 600+D) 600-800 MG-UNIT TABS Take 1 tablet by mouth daily.     cetirizine (ZYRTEC) 10 MG tablet Take 10 mg by mouth daily.      diazepam (VALIUM) 10 MG tablet Take 10 mg by mouth. As needed prior to procedures     doxepin (SINEQUAN) 25 MG capsule Take 100 mg by mouth at bedtime.     estradiol (ESTRACE) 0.1 MG/GM vaginal cream Insert 1g nightly for 1 wk, then 1 g once weekly as maintenance; can apply pea sized  amount externally at vaginal opening weekly 42.5 g 1   ibuprofen (ADVIL) 600 MG tablet Take 1 tablet (600 mg total) by mouth every 6 (six) hours as needed. 30 tablet 0   lamoTRIgine (LAMICTAL) 150 MG tablet Take 150 mg by mouth daily.     lurasidone (LATUDA) 80 MG TABS tablet Take 80 mg by mouth daily.     metFORMIN (GLUCOPHAGE-XR) 500 MG 24 hr tablet Take 500  mg by mouth 2 (two) times daily.     mometasone (NASONEX) 50 MCG/ACT nasal spray Place 2 sprays into the nose daily as needed (allergies).      Multiple Vitamin (MULTIVITAMIN WITH MINERALS) TABS tablet Take 1 tablet by mouth daily.     omeprazole (PRILOSEC) 20 MG capsule Take 20 mg by mouth daily before breakfast.      QUEtiapine (SEROQUEL) 300 MG tablet Take 300 mg by mouth at bedtime.     simvastatin (ZOCOR) 20 MG tablet Take 20 mg by mouth daily.     topiramate (TOPAMAX) 100 MG tablet Take 50-100 mg by mouth See admin instructions. Take 1 tablet (100 mg) by mouth in the morning & take 0.5 tablet (50 mg) by mouth at night.     vitamin B-12 (CYANOCOBALAMIN) 1000 MCG tablet Take 1,000 mcg by mouth daily.     Current Facility-Administered Medications on File Prior to Visit  Medication Dose Route Frequency Provider Last Rate Last Admin   0.9 %  sodium chloride infusion   Intravenous Continuous Rosey Bath, MD   Stopped at 01/08/20 1516   sodium chloride flush (NS) 0.9 % injection 10 mL  10 mL Intravenous PRN Rosey Bath, MD   10 mL at 01/08/20 1314    Allergies  Allergen Reactions   Compazine [Prochlorperazine Edisylate] Other (See Comments)    Acute dystonic reaction   Morphine And Codeine Shortness Of Breath   Famotidine Nausea Only   Doxycycline Other (See Comments)    "made all my symptoms worse"    Fluticasone Other (See Comments)    Doesn't work   Levofloxacin Other (See Comments)    "made all my symptoms worse"      Review of Systems Pertinent items noted in HPI and remainder of comprehensive ROS  otherwise negative.   Objective:     BP (!) 114/53   Pulse (!) 105   Resp 16   Ht 5\' 6"  (1.676 m)   Wt 174 lb (78.9 kg)   LMP 01/19/2019 (Approximate) Comment: "Over 2 years"  BMI 28.08 kg/m    General appearance: alert and no distress Abdomen: soft, non-tender; bowel sounds normal; no masses,  no organomegaly Pelvic:  VULVA: normal appearing without masses, tenderness or lesions, VAGINA: mildly atrophic, no lesions or discharge. Grade 2 cystocele with Grade 1-2 rectocele. No leakage of fluid on Valsalva. Pelvic floor muscles appear weakened with exercises.  CERVIX: normal appearing cervix without discharge or lesions UTERUS: uterus is normal size, shape, consistency and nontender ADNEXA: normal adnexa in size, nontender and no masses  RECTAL: rectal exam not indicated. Extremities: extremities normal, atraumatic, no cyanosis or edema Neurologic: Grossly normal  Assessment:   The patient has a cystocele (Grade 2) and rectocele (Grade 1-2).   Recurrent UTI's.  Vaginal atrophy Pelvic floor relaxation  Plan:   Discussed cystoceles/rectoceles and management options with the patient. All questions answered. Agricultural engineer distributed. Discussed pessary and will plan visit for fitting. Discussed pelvic floor physical therapy, which patient is also amenable to.  Encouraged to resume use of local estrogen therapy at this time.     Hildred Laser, MD [facility]

## 2023-04-24 ENCOUNTER — Ambulatory Visit (INDEPENDENT_AMBULATORY_CARE_PROVIDER_SITE_OTHER): Payer: BC Managed Care – PPO | Admitting: Obstetrics and Gynecology

## 2023-04-24 ENCOUNTER — Encounter: Payer: Self-pay | Admitting: Obstetrics and Gynecology

## 2023-04-24 VITALS — BP 114/53 | HR 105 | Resp 16 | Ht 66.0 in | Wt 174.0 lb

## 2023-04-24 DIAGNOSIS — N8110 Cystocele, unspecified: Secondary | ICD-10-CM | POA: Diagnosis not present

## 2023-04-24 DIAGNOSIS — N390 Urinary tract infection, site not specified: Secondary | ICD-10-CM

## 2023-04-24 DIAGNOSIS — N952 Postmenopausal atrophic vaginitis: Secondary | ICD-10-CM

## 2023-04-24 DIAGNOSIS — Z87440 Personal history of urinary (tract) infections: Secondary | ICD-10-CM | POA: Diagnosis not present

## 2023-04-24 DIAGNOSIS — N8189 Other female genital prolapse: Secondary | ICD-10-CM

## 2023-05-09 ENCOUNTER — Ambulatory Visit (INDEPENDENT_AMBULATORY_CARE_PROVIDER_SITE_OTHER): Payer: BC Managed Care – PPO | Admitting: Obstetrics and Gynecology

## 2023-05-09 ENCOUNTER — Encounter: Payer: Self-pay | Admitting: Obstetrics and Gynecology

## 2023-05-09 VITALS — BP 109/62 | HR 96 | Ht 66.0 in | Wt 174.0 lb

## 2023-05-09 DIAGNOSIS — N952 Postmenopausal atrophic vaginitis: Secondary | ICD-10-CM

## 2023-05-09 DIAGNOSIS — Z4689 Encounter for fitting and adjustment of other specified devices: Secondary | ICD-10-CM | POA: Diagnosis not present

## 2023-05-09 DIAGNOSIS — N816 Rectocele: Secondary | ICD-10-CM

## 2023-05-09 DIAGNOSIS — N390 Urinary tract infection, site not specified: Secondary | ICD-10-CM

## 2023-05-09 DIAGNOSIS — N8189 Other female genital prolapse: Secondary | ICD-10-CM

## 2023-05-09 NOTE — Progress Notes (Signed)
GYNECOLOGY PROGRESS NOTE  Subjective:    Patient ID: Heather Bell, female    DOB: September 22, 1975, 47 y.o.   MRN: [id number]  HPI  Heather Bell is a 47 y.o. G83P1102  female who presents for pessary fitting.  Has a history of recurrent UTI's, and mild pelvic organ prolapse (Grade 1-2 cystocele). Has recently resumed use of estrogen cream for vaginal atrophy as well as for recurrent UTI's.   The following portions of the patient's history were reviewed and updated as appropriate: allergies, current medications, past family history, past medical history, past social history, past surgical history, and problem list.  Review of Systems Pertinent items noted in HPI and remainder of comprehensive ROS otherwise negative.   Objective:   Blood pressure 109/62, pulse 96, height 5\' 6"  (1.676 m), weight 174 lb (78.9 kg), last menstrual period 01/19/2019. Body mass index is 28.08 kg/m. General appearance: alert and no distress Abdomen: soft, non-tender; bowel sounds normal; no masses,  no organomegaly Pelvic:  VULVA: normal appearing without masses, tenderness or lesions, VAGINA: mildly atrophic, no lesions or discharge. Grade 2 cystocele with Grade 1-2 rectocele. No leakage of fluid on Valsalva. Pelvic floor muscles appear weakened with exercises.  CERVIX: normal appearing cervix without discharge or lesions UTERUS: uterus is normal size, shape, consistency and nontender ADNEXA: normal adnexa in size, nontender and no masses  RECTAL: rectal exam not indicated. Extremities: extremities normal, atraumatic, no cyanosis or edema Neurologic: Grossly normal      Assessment:   1. Encounter for fitting and adjustment of pessary   2. Cystocele with rectocele   3. Pelvic floor relaxation   4. Vaginal atrophy   5. Recurrent UTI     Plan:   - Pessary fitting performed today.  Will fit with Size 2 ring with support.  - Patient to return when pessary arrives in 2-3 weeks.  - Continue Premarin cream as  prescribed for vaginal atrophy.      Hildred Laser, MD [facility]

## 2023-05-10 ENCOUNTER — Encounter: Payer: Self-pay | Admitting: Obstetrics and Gynecology

## 2023-05-15 ENCOUNTER — Ambulatory Visit
Admission: RE | Admit: 2023-05-15 | Discharge: 2023-05-15 | Disposition: A | Discharge status: Home / Self Care | Payer: BC Managed Care – PPO | Source: Ambulatory Visit | Attending: Internal Medicine | Admitting: Internal Medicine

## 2023-05-15 DIAGNOSIS — Z171 Estrogen receptor negative status [ER-]: Secondary | ICD-10-CM | POA: Diagnosis not present

## 2023-05-15 DIAGNOSIS — C50511 Malignant neoplasm of lower-outer quadrant of right female breast: Secondary | ICD-10-CM | POA: Diagnosis not present

## 2023-05-15 DIAGNOSIS — M85851 Other specified disorders of bone density and structure, right thigh: Secondary | ICD-10-CM | POA: Diagnosis not present

## 2023-05-15 DIAGNOSIS — M85852 Other specified disorders of bone density and structure, left thigh: Secondary | ICD-10-CM | POA: Diagnosis not present

## 2023-05-21 ENCOUNTER — Encounter (INDEPENDENT_AMBULATORY_CARE_PROVIDER_SITE_OTHER): Payer: BC Managed Care – PPO | Admitting: Obstetrics and Gynecology

## 2023-05-21 NOTE — Progress Notes (Signed)
    Patient presented for pessary insertion however pessary has not yet arrived. Will reschedule patient for when pessary arrives.  Today's visit canceled.    Tommie Raymond, CMA [facility]

## 2023-05-27 DIAGNOSIS — F419 Anxiety disorder, unspecified: Secondary | ICD-10-CM | POA: Diagnosis not present

## 2023-05-27 DIAGNOSIS — G4700 Insomnia, unspecified: Secondary | ICD-10-CM | POA: Diagnosis not present

## 2023-05-27 DIAGNOSIS — F900 Attention-deficit hyperactivity disorder, predominantly inattentive type: Secondary | ICD-10-CM | POA: Diagnosis not present

## 2023-06-10 NOTE — Progress Notes (Unsigned)
GYNECOLOGY PROGRESS NOTE  Subjective:    Patient ID: Heather Bell, female    DOB: 06/24/1976, 47 y.o.   MRN: [id number]  HPI  Patient is a 47 y.o. G1P1102 female who presents for pessary insertion. Has a history of recurrent UTI's, and mild pelvic organ prolapse (Grade 1-2 cystocele). Has recently resumed use of estrogen cream for vaginal atrophy as well as for recurrent UTI's. She was fitted with a  Size 2 ring with support.   {Common ambulatory SmartLinks:19316}  Review of Systems {ros; complete:30496}   Objective:   Last menstrual period 01/19/2019. There is no height or weight on file to calculate BMI. General appearance: {general exam:16600} Abdomen: {abdominal exam:16834} Pelvic: {pelvic exam:16852::"cervix normal in appearance","external genitalia normal","no adnexal masses or tenderness","no cervical motion tenderness","rectovaginal septum normal","uterus normal size, shape, and consistency","vagina normal without discharge"} Extremities: {extremity exam:5109} Neurologic: {neuro exam:17854}   Assessment:   No diagnosis found.   Plan:   There are no diagnoses linked to this encounter.    Hildred Laser, MD [facility]

## 2023-06-11 ENCOUNTER — Ambulatory Visit: Payer: BC Managed Care – PPO | Admitting: Obstetrics and Gynecology

## 2023-06-11 ENCOUNTER — Encounter: Payer: Self-pay | Admitting: Obstetrics and Gynecology

## 2023-06-11 VITALS — BP 104/65 | HR 96 | Resp 16 | Ht 66.0 in | Wt 175.0 lb

## 2023-06-11 DIAGNOSIS — N390 Urinary tract infection, site not specified: Secondary | ICD-10-CM

## 2023-06-11 DIAGNOSIS — N8110 Cystocele, unspecified: Secondary | ICD-10-CM

## 2023-06-11 DIAGNOSIS — Z4689 Encounter for fitting and adjustment of other specified devices: Secondary | ICD-10-CM

## 2023-06-11 DIAGNOSIS — N952 Postmenopausal atrophic vaginitis: Secondary | ICD-10-CM

## 2023-06-17 DIAGNOSIS — Z1389 Encounter for screening for other disorder: Secondary | ICD-10-CM | POA: Diagnosis not present

## 2023-06-17 DIAGNOSIS — E119 Type 2 diabetes mellitus without complications: Secondary | ICD-10-CM | POA: Diagnosis not present

## 2023-06-17 DIAGNOSIS — N390 Urinary tract infection, site not specified: Secondary | ICD-10-CM | POA: Diagnosis not present

## 2023-06-17 DIAGNOSIS — E785 Hyperlipidemia, unspecified: Secondary | ICD-10-CM | POA: Diagnosis not present

## 2023-06-17 DIAGNOSIS — Z23 Encounter for immunization: Secondary | ICD-10-CM | POA: Diagnosis not present

## 2023-06-24 DIAGNOSIS — J029 Acute pharyngitis, unspecified: Secondary | ICD-10-CM | POA: Diagnosis not present

## 2023-06-24 DIAGNOSIS — J329 Chronic sinusitis, unspecified: Secondary | ICD-10-CM | POA: Diagnosis not present

## 2023-06-24 DIAGNOSIS — Z712 Person consulting for explanation of examination or test findings: Secondary | ICD-10-CM | POA: Diagnosis not present

## 2023-06-24 DIAGNOSIS — E119 Type 2 diabetes mellitus without complications: Secondary | ICD-10-CM | POA: Diagnosis not present

## 2023-06-24 DIAGNOSIS — Z1389 Encounter for screening for other disorder: Secondary | ICD-10-CM | POA: Diagnosis not present

## 2023-07-02 ENCOUNTER — Telehealth: Payer: Self-pay

## 2023-07-02 NOTE — Telephone Encounter (Signed)
If it fell out, then we can order half a size up. Was previously using Size 2 ring with support.

## 2023-07-02 NOTE — Telephone Encounter (Signed)
Pt calling; her pessary fell out again; needs new one.  [phone]  Pt states pessary came out while she was using the toilet and it has been flushed.  Doesn't know exactly if she needs an appointment to be fitted again or just order another one. Adv will get [facility]'s opinion on it.

## 2023-07-12 DIAGNOSIS — L210 Seborrhea capitis: Secondary | ICD-10-CM | POA: Diagnosis not present

## 2023-07-12 DIAGNOSIS — Z1389 Encounter for screening for other disorder: Secondary | ICD-10-CM | POA: Diagnosis not present

## 2023-07-12 DIAGNOSIS — Z712 Person consulting for explanation of examination or test findings: Secondary | ICD-10-CM | POA: Diagnosis not present

## 2023-07-12 NOTE — Telephone Encounter (Signed)
Pt calling; hasn't heard anything from her call last week as to what the doctor wanted to do about her pessary falling out.  Has appt on the 27th and wants to be sure a new one is here for her.  [phone]

## 2023-07-12 NOTE — Telephone Encounter (Signed)
Patient advised pessary expected to arrive next week. She is scheduled for f/u apt 11/27. Will follow up if pessary does not arrive.

## 2023-07-16 NOTE — Telephone Encounter (Signed)
Pessary arrived. Patient aware.

## 2023-07-16 NOTE — Patient Instructions (Signed)
How to Use a Vaginal Pessary  A vaginal pessary is a removable device that is placed into your vagina to support pelvic organs that droop. These organs include your uterus, bladder, and rectum. When your pelvic organs drop down into your vagina, it causes a condition called pelvic organ prolapse (POP). A pessary may be an alternative to surgery for women with POP. It may help women who leak urine when they strain or exercise (stress incontinence). This is a symptom of POP. A vaginal pessary may also be a temporary treatment for stress incontinence during pregnancy. There are several types of pessaries. All types are usually made of silicone. You can insert and remove some on your own. Other types must be inserted and removed by your health care provider at office visits. The reason you are using a pessary and the severity of your condition will determine which one is best for you. It is also important to find the right size. A pessary that is too small may fall out. A pessary that is too large may cause pain or discomfort. Your health care provider will do a physical exam to find the correct size and fit for your pessary. It may take several appointments to find the best fit for you. If you can be fit with the type of pessary that you can insert, remove, and clean yourself, your health care provider will teach you how to use your pessary at home. You may have checkups every few months. If you have the type of pessary that needs to be inserted and removed by your health care provider, you will have appointments every few months to have the pessary removed, cleaned, and replaced. What are the risks? When properly fitted and cared for, risks of using a vaginal pessary can be small. However, there can be problems that may include: Vaginal discharge. Vaginal bleeding. A bad smell coming from your vagina. Scraping of the skin inside your vagina. How to use your pessary Follow your health care provider's  instructions for using a pessary. These instructions may vary, depending on the type of pessary you have. To insert a pessary: Wash your hands with soap and water for at least 20 seconds. Squeeze or fold the pessary in half and lubricate the tip with a water-based lubricant. Insert the pessary into your vagina. It will unfold and provide support. To remove the pessary, gently tug it out of your vagina. You can remove the pessary every night or after several days. You can also remove it to have sex. How to care for your pessary If you have a pessary that you can remove: Clean your pessary with soap and water. Rinse well. Dry it completely before inserting it back into your vagina. Follow these instructions at home: Take over-the-counter and prescription medicines only as told by your health care provider. Your health care provider may prescribe an estrogen cream to moisten your vagina. Keep all follow-up visits. This is important. Contact a health care provider if: You feel any pain or discomfort when your pessary is in place. You continue to have stress incontinence. You have trouble keeping your pessary from falling out. You have an unusual vaginal discharge that is blood-tinged or smells bad. Summary A vaginal pessary is a removable device that is placed into your vagina to support pelvic organs that droop. This condition is called pelvic organ prolapse (POP). There are several types of pessaries. Some you can insert and remove on your own. Others must be inserted and  removed by your health care provider. The best type for you depends on the reason you are using a pessary and the severity of your condition. It is also important to find the right size. If you can use the type that you insert and remove on your own, your health care provider will teach you how to use it and schedule checkups every few months. If you have the type that needs to be inserted and removed by your health care  provider, you will have regular appointments to have your pessary removed, cleaned, and replaced. This information is not intended to replace advice given to you by your health care provider. Make sure you discuss any questions you have with your health care provider. Document Revised: 02/04/2020 Document Reviewed: 02/04/2020 Elsevier Patient Education  2024 [employer].

## 2023-07-16 NOTE — Progress Notes (Unsigned)
    GYNECOLOGY PROGRESS NOTE  Subjective:    Patient ID: ANELL MEEGAN, female    DOB: Jan 07, 1976, 47 y.o.   MRN: [id number]  HPI  Patient is a 47 y.o. G55P1102 female who presents for Pessary Maintenance.  Has a history of recurrent UTI's, and mild pelvic organ prolapse (Grade 1-2 cystocele). Has recently resumed use of estrogen cream for vaginal atrophy as well as for recurrent UTI's.  {Common ambulatory SmartLinks:19316}  Review of Systems {ros; complete:30496}   Objective:   Last menstrual period 01/19/2019. There is no height or weight on file to calculate BMI. General appearance: {general exam:16600} Abdomen: {abdominal exam:16834} Pelvic: {pelvic exam:16852::"cervix normal in appearance","external genitalia normal","no adnexal masses or tenderness","no cervical motion tenderness","rectovaginal septum normal","uterus normal size, shape, and consistency","vagina normal without discharge"} Extremities: {extremity exam:5109} Neurologic: {neuro exam:17854}   Assessment:   1. Pessary maintenance   2. Cystocele with rectocele   3. Recurrent UTI      Plan:   There are no diagnoses linked to this encounter.      Hildred Laser, MD [facility]

## 2023-07-17 ENCOUNTER — Ambulatory Visit (INDEPENDENT_AMBULATORY_CARE_PROVIDER_SITE_OTHER): Payer: BC Managed Care – PPO | Admitting: Obstetrics and Gynecology

## 2023-07-17 ENCOUNTER — Encounter: Payer: Self-pay | Admitting: Obstetrics and Gynecology

## 2023-07-17 VITALS — BP 102/64 | HR 100 | Resp 16 | Ht 66.0 in | Wt 175.3 lb

## 2023-07-17 DIAGNOSIS — N8110 Cystocele, unspecified: Secondary | ICD-10-CM

## 2023-07-17 DIAGNOSIS — N390 Urinary tract infection, site not specified: Secondary | ICD-10-CM

## 2023-07-17 DIAGNOSIS — Z4689 Encounter for fitting and adjustment of other specified devices: Secondary | ICD-10-CM | POA: Diagnosis not present

## 2023-07-17 DIAGNOSIS — K5900 Constipation, unspecified: Secondary | ICD-10-CM

## 2023-07-25 DIAGNOSIS — F419 Anxiety disorder, unspecified: Secondary | ICD-10-CM | POA: Diagnosis not present

## 2023-07-25 DIAGNOSIS — F900 Attention-deficit hyperactivity disorder, predominantly inattentive type: Secondary | ICD-10-CM | POA: Diagnosis not present

## 2023-07-25 DIAGNOSIS — F3181 Bipolar II disorder: Secondary | ICD-10-CM | POA: Diagnosis not present

## 2023-08-02 NOTE — Patient Instructions (Signed)
How to Use a Vaginal Pessary  A vaginal pessary is a removable device that is placed into your vagina to support pelvic organs that droop. These organs include your uterus, bladder, and rectum. When your pelvic organs drop down into your vagina, it causes a condition called pelvic organ prolapse (POP). A pessary may be an alternative to surgery for women with POP. It may help women who leak urine when they strain or exercise (stress incontinence). This is a symptom of POP. A vaginal pessary may also be a temporary treatment for stress incontinence during pregnancy. There are several types of pessaries. All types are usually made of silicone. You can insert and remove some on your own. Other types must be inserted and removed by your health care provider at office visits. The reason you are using a pessary and the severity of your condition will determine which one is best for you. It is also important to find the right size. A pessary that is too small may fall out. A pessary that is too large may cause pain or discomfort. Your health care provider will do a physical exam to find the correct size and fit for your pessary. It may take several appointments to find the best fit for you. If you can be fit with the type of pessary that you can insert, remove, and clean yourself, your health care provider will teach you how to use your pessary at home. You may have checkups every few months. If you have the type of pessary that needs to be inserted and removed by your health care provider, you will have appointments every few months to have the pessary removed, cleaned, and replaced. What are the risks? When properly fitted and cared for, risks of using a vaginal pessary can be small. However, there can be problems that may include: Vaginal discharge. Vaginal bleeding. A bad smell coming from your vagina. Scraping of the skin inside your vagina. How to use your pessary Follow your health care provider's  instructions for using a pessary. These instructions may vary, depending on the type of pessary you have. To insert a pessary: Wash your hands with soap and water for at least 20 seconds. Squeeze or fold the pessary in half and lubricate the tip with a water-based lubricant. Insert the pessary into your vagina. It will unfold and provide support. To remove the pessary, gently tug it out of your vagina. You can remove the pessary every night or after several days. You can also remove it to have sex. How to care for your pessary If you have a pessary that you can remove: Clean your pessary with soap and water. Rinse well. Dry it completely before inserting it back into your vagina. Follow these instructions at home: Take over-the-counter and prescription medicines only as told by your health care provider. Your health care provider may prescribe an estrogen cream to moisten your vagina. Keep all follow-up visits. This is important. Contact a health care provider if: You feel any pain or discomfort when your pessary is in place. You continue to have stress incontinence. You have trouble keeping your pessary from falling out. You have an unusual vaginal discharge that is blood-tinged or smells bad. Summary A vaginal pessary is a removable device that is placed into your vagina to support pelvic organs that droop. This condition is called pelvic organ prolapse (POP). There are several types of pessaries. Some you can insert and remove on your own. Others must be inserted and  removed by your health care provider. The best type for you depends on the reason you are using a pessary and the severity of your condition. It is also important to find the right size. If you can use the type that you insert and remove on your own, your health care provider will teach you how to use it and schedule checkups every few months. If you have the type that needs to be inserted and removed by your health care  provider, you will have regular appointments to have your pessary removed, cleaned, and replaced. This information is not intended to replace advice given to you by your health care provider. Make sure you discuss any questions you have with your health care provider. Document Revised: 02/04/2020 Document Reviewed: 02/04/2020 Elsevier Patient Education  2024 [employer].

## 2023-08-02 NOTE — Progress Notes (Unsigned)
    Virtual Gynecology Visit via Video Note  I connected with Heather Bell on 08/06/23 at 11:15 AM EST by a video enabled telemedicine application and verified that I am speaking with the correct person using two identifiers.  Location: Patient: Home Provider: Office   I discussed the limitations of evaluation and management by telemedicine and the availability of in person appointments. The patient expressed understanding and agreed to proceed.   Subjective:    Patient ID: Heather Bell, female    DOB: [birth year], 47 y.o.   MRN: [id number]  HPI  Patient is a 47 y.o. G35P1102 female who presents for 2 week pessary check. Has a history of recurrent UTI's, and mild pelvic organ prolapse (Grade 1-2 cystocele). . She had a Size 2 1/2 ring with support inserted after expulsion of her previous pessary.  Reports that this one seems to be staying in place, except that she did have to push it back in once after a bowel movement.    The following portions of the patient's history were reviewed and updated as appropriate: allergies, current medications, past family history, past medical history, past social history, past surgical history, and problem list.  Review of Systems Pertinent items noted in HPI and remainder of comprehensive ROS otherwise negative.   Objective:  Height 5\' 6"  (1.676 m), weight 175 lb (79.4 kg), last menstrual period 01/19/2019.  Body mass index is 28.25 kg/m.  General appearance: alert and no distress Head: NCAT Psych: normal speech and affect   Assessment:   1. Pessary maintenance   2. Cystocele with rectocele   3. Recurrent UTI      Plan:   Patient feels that she is doing better with this pessary.  Will follow up in 10 weeks for next pessary check.     I discussed the assessment and treatment plan with the patient. The patient was provided an opportunity to ask questions and all were answered. The patient agreed with the plan and demonstrated an  understanding of the instructions.   The patient was advised to call back or seek an in-person evaluation if the symptoms worsen or if the condition fails to improve as anticipated.  I provided 6 minutes of non-face-to-face time during this encounter.   Hildred Laser, MD [facility]

## 2023-08-06 ENCOUNTER — Telehealth (INDEPENDENT_AMBULATORY_CARE_PROVIDER_SITE_OTHER): Payer: BC Managed Care – PPO | Admitting: Obstetrics and Gynecology

## 2023-08-06 ENCOUNTER — Encounter: Payer: Self-pay | Admitting: Obstetrics and Gynecology

## 2023-08-06 VITALS — Ht 66.0 in | Wt 175.0 lb

## 2023-08-06 DIAGNOSIS — N8110 Cystocele, unspecified: Secondary | ICD-10-CM | POA: Diagnosis not present

## 2023-08-06 DIAGNOSIS — Z4689 Encounter for fitting and adjustment of other specified devices: Secondary | ICD-10-CM | POA: Diagnosis not present

## 2023-08-06 DIAGNOSIS — N390 Urinary tract infection, site not specified: Secondary | ICD-10-CM

## 2023-08-06 DIAGNOSIS — N816 Rectocele: Secondary | ICD-10-CM

## 2023-08-06 DIAGNOSIS — Z87440 Personal history of urinary (tract) infections: Secondary | ICD-10-CM

## 2023-10-23 DIAGNOSIS — J329 Chronic sinusitis, unspecified: Secondary | ICD-10-CM | POA: Diagnosis not present

## 2023-10-23 DIAGNOSIS — Z1389 Encounter for screening for other disorder: Secondary | ICD-10-CM | POA: Diagnosis not present

## 2023-10-23 DIAGNOSIS — E119 Type 2 diabetes mellitus without complications: Secondary | ICD-10-CM | POA: Diagnosis not present

## 2023-10-23 DIAGNOSIS — Z712 Person consulting for explanation of examination or test findings: Secondary | ICD-10-CM | POA: Diagnosis not present

## 2023-12-18 DIAGNOSIS — L720 Epidermal cyst: Secondary | ICD-10-CM | POA: Diagnosis not present

## 2023-12-18 DIAGNOSIS — Z712 Person consulting for explanation of examination or test findings: Secondary | ICD-10-CM | POA: Diagnosis not present

## 2023-12-18 DIAGNOSIS — Z1389 Encounter for screening for other disorder: Secondary | ICD-10-CM | POA: Diagnosis not present

## 2023-12-18 DIAGNOSIS — L209 Atopic dermatitis, unspecified: Secondary | ICD-10-CM | POA: Diagnosis not present

## 2024-01-09 ENCOUNTER — Ambulatory Visit: Payer: BC Managed Care – PPO | Admitting: Plastic Surgery

## 2024-01-28 ENCOUNTER — Ambulatory Visit: Payer: BC Managed Care – PPO | Attending: Physician Assistant

## 2024-01-28 ENCOUNTER — Ambulatory Visit: Payer: Self-pay | Admitting: Physician Assistant

## 2024-01-28 DIAGNOSIS — Z0189 Encounter for other specified special examinations: Secondary | ICD-10-CM

## 2024-01-28 DIAGNOSIS — Z9189 Other specified personal risk factors, not elsewhere classified: Secondary | ICD-10-CM

## 2024-01-28 LAB — ECHOCARDIOGRAM COMPLETE
AR max vel: 2.4 cm2
AV Area VTI: 2.15 cm2
AV Area mean vel: 2.3 cm2
AV Mean grad: 4 mmHg
AV Peak grad: 7.5 mmHg
Ao pk vel: 1.37 m/s
Area-P 1/2: 4.49 cm2
S' Lateral: 2.54 cm

## 2024-02-07 DIAGNOSIS — F5104 Psychophysiologic insomnia: Secondary | ICD-10-CM | POA: Diagnosis not present

## 2024-02-07 DIAGNOSIS — F900 Attention-deficit hyperactivity disorder, predominantly inattentive type: Secondary | ICD-10-CM | POA: Diagnosis not present

## 2024-02-07 DIAGNOSIS — F3181 Bipolar II disorder: Secondary | ICD-10-CM | POA: Diagnosis not present

## 2024-02-13 ENCOUNTER — Other Ambulatory Visit: Payer: Self-pay | Admitting: Internal Medicine

## 2024-02-13 DIAGNOSIS — Z1231 Encounter for screening mammogram for malignant neoplasm of breast: Secondary | ICD-10-CM

## 2024-02-15 ENCOUNTER — Ambulatory Visit: Admission: EM | Admit: 2024-02-15 | Discharge: 2024-02-15 | Disposition: A | Discharge status: Home / Self Care

## 2024-02-15 DIAGNOSIS — L0100 Impetigo, unspecified: Secondary | ICD-10-CM

## 2024-02-15 MED ORDER — MUPIROCIN 2 % EX OINT
1.0000 | TOPICAL_OINTMENT | Freq: Two times a day (BID) | CUTANEOUS | 0 refills | Status: DC
Start: 2024-02-15 — End: 2024-04-07

## 2024-02-15 NOTE — ED Triage Notes (Signed)
 Pt being seen in UC for sore near corner of mouth on L side. Pt reports this started yesterday, pt reports putting erythromycin topical ointment on corner of mouth with no relief. Pt reports sore has gotten worse over night. Some drainage noticed upon assessment. Pt reports itching near site. Pt denies fevers.

## 2024-02-15 NOTE — ED Provider Notes (Signed)
 [name]    CSN: [id number] Arrival date & time: 02/15/24  0935      History   Chief Complaint Chief Complaint  Patient presents with   Wound Check    HPI Heather Bell is a 48 y.o. female.   Patient presents today with a 1 day history of sore on her left upper lateral lip.  She reports that it was initially an ulcerated lesion and then became purulent with some crusting.  She has a history of perioral dermatitis and has been treated with erythromycin ointment.  She applied this medication but it was ineffective when she woke up the area was larger and had additional crusting prompting evaluation.  She denies history of MRSA or recurrent skin infections.  Denies any recent hospitalization or surgical procedure.  Denies any recent systemic antibiotics.      Past Medical History:  Diagnosis Date   Allergic rhinitis    Anxiety    Bipolar disorder (HCC)    depression   Breast cancer (HCC) 03/21/2020   Right mastectomy   Cancer (HCC)    breast right   Depression    Diabetes (HCC)    GERD (gastroesophageal reflux disease)    Hyperlipidemia    Personal history of chemotherapy    Jan 2021 through July 2021    Patient Active Problem List   Diagnosis Date Noted   Vaginal atrophy 02/13/2022   Abnormal echocardiogram 02/17/2021   History of breast cancer 02/17/2021   Breast cancer (HCC) 03/21/2020   Anemia due to antineoplastic chemotherapy 02/15/2020   Cardiomyopathy (HCC) 12/12/2019   Dehydration 11/24/2019   Acute pain of left shoulder 11/24/2019   Orthostatic hypotension 10/12/2019   Low serum cortisol level 09/29/2019   Carcinoma of lower-outer quadrant of right breast in female, estrogen receptor negative (HCC) 09/28/2019   Hypokalemia 09/15/2019   Hypocalcemia 09/15/2019   Normocytic anemia 09/15/2019   B12 deficiency 09/15/2019   Iron  deficiency anemia 09/15/2019   Encounter for antineoplastic chemotherapy 08/25/2019   Goals of care,  counseling/discussion 08/17/2019   Weight loss 07/31/2019   H/O diarrhea 01/01/2017    Past Surgical History:  Procedure Laterality Date   BREAST BIOPSY Right 07/22/2019   mass bx, path pending, heart marker   BREAST BIOPSY Right 07/22/2019   LN bx, path pending,  butterfly hydromarker   BREAST RECONSTRUCTION WITH PLACEMENT OF TISSUE EXPANDER AND FLEX HD (ACELLULAR HYDRATED DERMIS) Right 03/21/2020   Procedure: RIGHT BREAST RECONSTRUCTION WITH PLACEMENT OF TISSUE EXPANDER AND FLEX HD (ACELLULAR HYDRATED DERMIS);  Surgeon: Elisabeth Craig RAMAN, MD;  Location: [facility];  Service: Plastics;  Laterality: Right;   BREAST RECONSTRUCTION WITH PLACEMENT OF TISSUE EXPANDER AND FLEX HD (ACELLULAR HYDRATED DERMIS) Right 12/27/2020   Procedure: BREAST RECONSTRUCTION WITH PLACEMENT OF TISSUE EXPANDER AND FLEX HD (ACELLULAR HYDRATED DERMIS);  Surgeon: Elisabeth Craig RAMAN, MD;  Location: [facility];  Service: Plastics;  Laterality: Right;   BREAST REDUCTION SURGERY Left 06/27/2021   Procedure: Left breast reduction;  Surgeon: Elisabeth Craig RAMAN, MD;  Location: [facility];  Service: Plastics;  Laterality: Left;   IMAGE GUIDED SINUS SURGERY     INCISION AND DRAINAGE OF WOUND Right 05/02/2020   Procedure: IRRIGATION AND DEBRIDEMENT WOUND WITH REMOVAL OF RIGHT TISSUE EXPANDER;  Surgeon: Elisabeth Craig RAMAN, MD;  Location: [facility];  Service: Plastics;  Laterality: Right;  1 hour please   KNEE SURGERY     MASTECTOMY Right 03/21/2020   NASAL  SEPTUM SURGERY     PORTACATH PLACEMENT N/A 08/07/2019   Procedure: INSERTION PORT-A-CATH;  Surgeon: Rodolph Romano, MD;  Location: [facility];  Service: General;  Laterality: N/A;   REDUCTION MAMMAPLASTY     REMOVAL OF TISSUE EXPANDER AND PLACEMENT OF IMPLANT Right 06/27/2021   Procedure: Removal right breast tissue expander and placement of gel implant;  Surgeon: Elisabeth Craig RAMAN, MD;  Location: [facility];  Service:  Plastics;  Laterality: Right;   TONSILLECTOMY     TOTAL MASTECTOMY Right 03/21/2020   Procedure: TOTAL MASTECTOMY;  Surgeon: Rodolph Romano, MD;  Location: [facility];  Service: General;  Laterality: Right;    OB History     Gravida  2   Para  2   Term  1   Preterm  1   AB      Living  2      SAB      IAB      Ectopic      Multiple      Live Births  2            Home Medications    Prior to Admission medications   Medication Sig Start Date End Date Taking? Authorizing Provider  mupirocin ointment (BACTROBAN) 2 % Apply 1 Application topically 2 (two) times daily. 02/15/24  Yes Genova Kiner, Rocky POUR, PA-C  Accu-Chek FastClix Lancets MISC See admin instructions. 03/10/21   [provider]  ACCU-CHEK GUIDE test strip SMARTSIG:1 Strip(s) Via Meter Daily PRN 03/10/21   [provider]  amphetamine -dextroamphetamine  (ADDERALL XR) 25 MG 24 hr capsule Take 25 mg by mouth daily. 06/29/19   [provider]  azelastine (ASTELIN) 0.1 % nasal spray Place 1 spray into both nostrils daily.    [provider]  Blood Glucose Monitoring Suppl (ACCU-CHEK GUIDE ME) w/Device KIT USE 1 DEVICE ONCE DAILY AS DIRECTED 03/10/21   [provider]  busPIRone (BUSPAR) 10 MG tablet Take 10 mg by mouth 2 (two) times daily. 05/27/23   [provider]  Calcium  Carb-Cholecalciferol (CALCIUM  600+D) 600-800 MG-UNIT TABS Take 1 tablet by mouth daily.    [provider]  Calcium  Carb-Cholecalciferol (CALCIUM  CARBONATE-VITAMIN D3 PO) Calcium  600 + D(3) 600 mg calcium - 200 unit capsule    [provider]  cetirizine (ZYRTEC) 10 MG tablet Take 10 mg by mouth daily.  04/20/08   [provider]  diazepam (VALIUM) 10 MG tablet Take 10 mg by mouth. As needed prior to procedures    [provider]  doxepin  (SINEQUAN ) 100 MG capsule Take 100 mg by mouth at bedtime. 06/24/23   [provider]  doxepin  (SINEQUAN ) 50 MG  capsule Take 150 mg by mouth at bedtime. 05/22/23   [provider]  escitalopram (LEXAPRO) 10 MG tablet Take 10 mg by mouth daily. Patient not taking: Reported on 02/15/2024 07/26/23   [provider]  estradiol  (ESTRACE ) 0.1 MG/GM vaginal cream Insert 1g nightly for 1 wk, then 1 g once weekly as maintenance; can apply pea sized amount externally at vaginal opening weekly 02/13/22   Copland, Alicia B, PA-C  ibuprofen  (ADVIL ) 600 MG tablet Take 1 tablet (600 mg total) by mouth every 6 (six) hours as needed. 03/21/22   Van Knee, MD  JARDIANCE 10 MG TABS tablet Take 10 mg by mouth daily.    [provider]  lamoTRIgine  (LAMICTAL ) 150 MG tablet Take 150 mg by mouth daily. 10/19/20   [provider]  lurasidone  (LATUDA ) 80  MG TABS tablet Take 80 mg by mouth daily. 02/28/22   [provider]  metFORMIN  (GLUCOPHAGE -XR) 500 MG 24 hr tablet Take 500 mg by mouth 2 (two) times daily. 07/04/19   [provider]  mometasone (NASONEX) 50 MCG/ACT nasal spray Place 2 sprays into the nose daily as needed (allergies).     [provider]  Multiple Vitamin (MULTIVITAMIN WITH MINERALS) TABS tablet Take 1 tablet by mouth daily.    [provider]  NIZORAL A-D 1 % SHAM Apply topically. Patient not taking: Reported on 02/15/2024 07/12/23   [provider]  omeprazole (PRILOSEC) 20 MG capsule Take 20 mg by mouth daily before breakfast.  05/19/19   [provider]  QUEtiapine  (SEROQUEL ) 300 MG tablet Take 300 mg by mouth at bedtime. 02/06/20   [provider]  simvastatin  (ZOCOR ) 20 MG tablet Take 20 mg by mouth daily.    [provider]  topiramate  (TOPAMAX ) 100 MG tablet Take 50-100 mg by mouth See admin instructions. Take 1 tablet (100 mg) by mouth in the morning & take 0.5 tablet (50 mg) by mouth at night.    [provider]  vitamin B-12 (CYANOCOBALAMIN ) 1000 MCG tablet Take 1,000 mcg by mouth daily.     [provider]    Family History Family History  Problem Relation Age of Onset   Lung cancer Father    Heart attack Father    Lung cancer Paternal Aunt    Lung cancer Paternal Uncle    Heart disease Paternal Uncle    Breast cancer Neg Hx     Social History Social History   Tobacco Use   Smoking status: Never   Smokeless tobacco: Never  Vaping Use   Vaping status: Never Used  Substance Use Topics   Alcohol use: No    Alcohol/week: 0.0 standard drinks of alcohol   Drug use: No     Allergies   Compazine [prochlorperazine edisylate], Morphine and codeine, Famotidine , Doxycycline, Fluticasone, and Levofloxacin   Review of Systems Review of Systems  Constitutional:  Positive for activity change. Negative for appetite change, fatigue and fever.  Gastrointestinal:  Negative for abdominal pain, diarrhea, nausea and vomiting.  Musculoskeletal:  Negative for arthralgias and myalgias.  Skin:  Positive for wound. Negative for color change.     Physical Exam Triage Vital Signs ED Triage Vitals  Encounter Vitals Group     BP 02/15/24 0943 120/82     Girls Systolic BP Percentile --      Girls Diastolic BP Percentile --      Boys Systolic BP Percentile --      Boys Diastolic BP Percentile --      Pulse Rate 02/15/24 0943 (!) 108     Resp 02/15/24 0943 18     Temp 02/15/24 0943 98.1 F (36.7 C)     Temp Source 02/15/24 0943 Oral     SpO2 02/15/24 0943 98 %     Weight --      Height --      Head Circumference --      Peak Flow --      Pain Score 02/15/24 0944 2     Pain Loc --      Pain Education --      Exclude from Growth Chart --    No data found.  Updated Vital Signs BP 120/82 (BP Location: Left Arm)   Pulse 99   Temp 98.1 F (36.7 C) (Oral)  Resp 18   LMP 01/19/2019 (Approximate) Comment: Over 2 years  SpO2 98%   Visual Acuity Right Eye Distance:   Left Eye Distance:   Bilateral Distance:    Right Eye Near:   Left Eye Near:     Bilateral Near:     Physical Exam Vitals reviewed.  Constitutional:      General: She is awake. She is not in acute distress.    Appearance: Normal appearance. She is well-developed. She is not ill-appearing.     Comments: Very pleasant female appears stated age in no acute distress sitting comfortably in exam room  HENT:     Head: Normocephalic and atraumatic.     Mouth/Throat:     Lips: Lesions present.    Cardiovascular:     Rate and Rhythm: Normal rate and regular rhythm.     Heart sounds: Normal heart sounds, S1 normal and S2 normal. No murmur heard. Pulmonary:     Effort: Pulmonary effort is normal.     Breath sounds: Normal breath sounds. No wheezing, rhonchi or rales.     Comments: Clear to auscultation bilaterally  Psychiatric:        Behavior: Behavior is cooperative.      UC Treatments / Results  Labs (all labs ordered are listed, but only abnormal results are displayed) Labs Reviewed - No data to display  EKG   Radiology No results found.  Procedures Procedures (including critical care time)  Medications Ordered in UC Medications - No data to display  Initial Impression / Assessment and Plan / UC Course  I have reviewed the triage vital signs and the nursing notes.  Pertinent labs & imaging results that were available during my care of the patient were reviewed by me and considered in my medical decision making (see chart for details).     Patient is well-appearing, afebrile, nontoxic.  She was initially tachycardic but this normalized after sitting quietly for several minutes.  Symptoms are consistent with impetigo.  Given small area will treat with topical mupirocin.  She was encouraged to keep the area clean and apply this twice daily.  We discussed that if this enlarges or she develops any additional lesions she should return as we would consider systemic antibiotics such as cephalexin at that time but given small area of involvement will defer  systemic antibiotics for the time being.  If anything worsens or changes she is to return for reevaluation immediately.  Strict return precautions given.  All questions were answered to patient satisfaction.  Final Clinical Impressions(s) / UC Diagnoses   Final diagnoses:  Impetigo     Discharge Instructions      We are treating you for an infection.  Apply Bactroban ointment twice daily after cleaning the area with soap and water .  If this area gets larger or you develop additional lesions please return as we may think about oral antibiotics.  If anything worsens and you have fever, nausea, vomiting you need to be seen immediately.     ED Prescriptions     Medication Sig Dispense Auth. Provider   mupirocin ointment (BACTROBAN) 2 % Apply 1 Application topically 2 (two) times daily. 22 g Erynn Vaca K, PA-C      PDMP not reviewed this encounter.   Sherrell Rocky POUR, PA-C 02/15/24 1002

## 2024-02-15 NOTE — Discharge Instructions (Signed)
 We are treating you for an infection.  Apply Bactroban ointment twice daily after cleaning the area with soap and water .  If this area gets larger or you develop additional lesions please return as we may think about oral antibiotics.  If anything worsens and you have fever, nausea, vomiting you need to be seen immediately.

## 2024-02-16 ENCOUNTER — Ambulatory Visit
Admission: EM | Admit: 2024-02-16 | Discharge: 2024-02-16 | Disposition: A | Discharge status: Home / Self Care | Attending: Physician Assistant | Admitting: Physician Assistant

## 2024-02-16 DIAGNOSIS — L0100 Impetigo, unspecified: Secondary | ICD-10-CM

## 2024-02-16 MED ORDER — CEPHALEXIN 500 MG PO CAPS
500.0000 mg | ORAL_CAPSULE | Freq: Four times a day (QID) | ORAL | 0 refills | Status: DC
Start: 2024-02-16 — End: 2024-04-07

## 2024-02-16 NOTE — ED Triage Notes (Signed)
 Pt being seen in UC for wound check. Pt reports swelling started around sore this morning. Pt denies fevers.

## 2024-02-16 NOTE — ED Provider Notes (Signed)
 Heather Bell    CSN: [id number] Arrival date & time: 02/16/24  1138      History   Chief Complaint Chief Complaint  Patient presents with   Wound Check    HPI Heather Bell is a 48 y.o. female.   Patient presents today for reevaluation of wound on her left perioral region.  She was seen by clinic yesterday (02/15/2024) at which point she was diagnosed with impetigo and started on topical mupirocin.  She reports applying this medication as prescribed but has noticed worsening lesion with associated swelling and pain.  She reports that the discomfort is rated 4 on a 0-10 pain scale, localized to area surrounding lesion, no aggravating leaving factors identified.  She has not been taking additional over-the-counter medication for symptom management.  She denies any recurrent skin infections.  Denies any recent systemic antibiotics.  Denies any fever, nausea, vomiting.  She has no concern for pregnancy.    Past Medical History:  Diagnosis Date   Allergic rhinitis    Anxiety    Bipolar disorder (HCC)    depression   Breast cancer (HCC) 03/21/2020   Right mastectomy   Cancer (HCC)    breast right   Depression    Diabetes (HCC)    GERD (gastroesophageal reflux disease)    Hyperlipidemia    Personal history of chemotherapy    Jan 2021 through July 2021    Patient Active Problem List   Diagnosis Date Noted   Vaginal atrophy 02/13/2022   Abnormal echocardiogram 02/17/2021   History of breast cancer 02/17/2021   Breast cancer (HCC) 03/21/2020   Anemia due to antineoplastic chemotherapy 02/15/2020   Cardiomyopathy (HCC) 12/12/2019   Dehydration 11/24/2019   Acute pain of left shoulder 11/24/2019   Orthostatic hypotension 10/12/2019   Low serum cortisol level 09/29/2019   Carcinoma of lower-outer quadrant of right breast in female, estrogen receptor negative (HCC) 09/28/2019   Hypokalemia 09/15/2019   Hypocalcemia 09/15/2019   Normocytic anemia 09/15/2019   B12  deficiency 09/15/2019   Iron  deficiency anemia 09/15/2019   Encounter for antineoplastic chemotherapy 08/25/2019   Goals of care, counseling/discussion 08/17/2019   Weight loss 07/31/2019   H/O diarrhea 01/01/2017    Past Surgical History:  Procedure Laterality Date   BREAST BIOPSY Right 07/22/2019   mass bx, path pending, heart marker   BREAST BIOPSY Right 07/22/2019   LN bx, path pending,  butterfly hydromarker   BREAST RECONSTRUCTION WITH PLACEMENT OF TISSUE EXPANDER AND FLEX HD (ACELLULAR HYDRATED DERMIS) Right 03/21/2020   Procedure: RIGHT BREAST RECONSTRUCTION WITH PLACEMENT OF TISSUE EXPANDER AND FLEX HD (ACELLULAR HYDRATED DERMIS);  Surgeon: Elisabeth Craig RAMAN, MD;  Location: [facility];  Service: Plastics;  Laterality: Right;   BREAST RECONSTRUCTION WITH PLACEMENT OF TISSUE EXPANDER AND FLEX HD (ACELLULAR HYDRATED DERMIS) Right 12/27/2020   Procedure: BREAST RECONSTRUCTION WITH PLACEMENT OF TISSUE EXPANDER AND FLEX HD (ACELLULAR HYDRATED DERMIS);  Surgeon: Elisabeth Craig RAMAN, MD;  Location: [facility];  Service: Plastics;  Laterality: Right;   BREAST REDUCTION SURGERY Left 06/27/2021   Procedure: Left breast reduction;  Surgeon: Elisabeth Craig RAMAN, MD;  Location: [facility];  Service: Plastics;  Laterality: Left;   IMAGE GUIDED SINUS SURGERY     INCISION AND DRAINAGE OF WOUND Right 05/02/2020   Procedure: IRRIGATION AND DEBRIDEMENT WOUND WITH REMOVAL OF RIGHT TISSUE EXPANDER;  Surgeon: Elisabeth Craig RAMAN, MD;  Location: [facility];  Service: Plastics;  Laterality: Right;  1  hour please   KNEE SURGERY     MASTECTOMY Right 03/21/2020   NASAL SEPTUM SURGERY     PORTACATH PLACEMENT N/A 08/07/2019   Procedure: INSERTION PORT-A-CATH;  Surgeon: Rodolph Romano, MD;  Location: [facility];  Service: General;  Laterality: N/A;   REDUCTION MAMMAPLASTY     REMOVAL OF TISSUE EXPANDER AND PLACEMENT OF IMPLANT Right 06/27/2021   Procedure: Removal right  breast tissue expander and placement of gel implant;  Surgeon: Elisabeth Craig RAMAN, MD;  Location: [facility];  Service: Plastics;  Laterality: Right;   TONSILLECTOMY     TOTAL MASTECTOMY Right 03/21/2020   Procedure: TOTAL MASTECTOMY;  Surgeon: Rodolph Romano, MD;  Location: [facility];  Service: General;  Laterality: Right;    OB History     Gravida  2   Para  2   Term  1   Preterm  1   AB      Living  2      SAB      IAB      Ectopic      Multiple      Live Births  2            Home Medications    Prior to Admission medications   Medication Sig Start Date End Date Taking? Authorizing Provider  cephALEXin (KEFLEX) 500 MG capsule Take 1 capsule (500 mg total) by mouth 4 (four) times daily. 02/16/24  Yes Artemisia Auvil, Rocky POUR, PA-C  Accu-Chek FastClix Lancets MISC See admin instructions. 03/10/21   [provider]  ACCU-CHEK GUIDE test strip SMARTSIG:1 Strip(s) Via Meter Daily PRN 03/10/21   [provider]  amphetamine -dextroamphetamine  (ADDERALL XR) 25 MG 24 hr capsule Take 25 mg by mouth daily. 06/29/19   [provider]  azelastine (ASTELIN) 0.1 % nasal spray Place 1 spray into both nostrils daily.    [provider]  Blood Glucose Monitoring Suppl (ACCU-CHEK GUIDE ME) w/Device KIT USE 1 DEVICE ONCE DAILY AS DIRECTED 03/10/21   [provider]  busPIRone (BUSPAR) 10 MG tablet Take 10 mg by mouth 2 (two) times daily. 05/27/23   [provider]  Calcium  Carb-Cholecalciferol (CALCIUM  600+D) 600-800 MG-UNIT TABS Take 1 tablet by mouth daily.    [provider]  Calcium  Carb-Cholecalciferol (CALCIUM  CARBONATE-VITAMIN D3 PO) Calcium  600 + D(3) 600 mg calcium - 200 unit capsule    [provider]  cetirizine (ZYRTEC) 10 MG tablet Take 10 mg by mouth daily.  04/20/08   [provider]  diazepam (VALIUM) 10 MG tablet Take 10 mg by mouth. As needed prior to procedures    [provider]  doxepin  (SINEQUAN ) 100 MG capsule Take 100 mg by mouth at bedtime. 06/24/23   [provider]  doxepin  (SINEQUAN ) 50 MG capsule Take 150 mg by mouth at bedtime. 05/22/23   [provider]  escitalopram (LEXAPRO) 10 MG tablet Take 10 mg by mouth daily. Patient not taking: Reported on 02/15/2024 07/26/23   [provider]  estradiol  (ESTRACE ) 0.1 MG/GM vaginal cream Insert 1g nightly for 1 wk, then 1 g once weekly as maintenance; can apply pea sized amount externally at vaginal opening weekly 02/13/22   Copland, Alicia B, PA-C  ibuprofen  (ADVIL ) 600 MG tablet Take 1 tablet (600 mg total) by mouth every 6 (six) hours as needed. 03/21/22   Van Knee, MD  JARDIANCE 10 MG TABS tablet Take 10 mg by mouth daily.    [provider]  lamoTRIgine  (  LAMICTAL ) 150 MG tablet Take 150 mg by mouth daily. 10/19/20   [provider]  lurasidone  (LATUDA ) 80 MG TABS tablet Take 80 mg by mouth daily. 02/28/22   [provider]  metFORMIN  (GLUCOPHAGE -XR) 500 MG 24 hr tablet Take 500 mg by mouth 2 (two) times daily. 07/04/19   [provider]  mometasone (NASONEX) 50 MCG/ACT nasal spray Place 2 sprays into the nose daily as needed (allergies).     [provider]  Multiple Vitamin (MULTIVITAMIN WITH MINERALS) TABS tablet Take 1 tablet by mouth daily.    [provider]  mupirocin ointment (BACTROBAN) 2 % Apply 1 Application topically 2 (two) times daily. 02/15/24   Antoninette Lerner K, PA-C  NIZORAL A-D 1 % SHAM Apply topically. Patient not taking: Reported on 02/15/2024 07/12/23   [provider]  omeprazole (PRILOSEC) 20 MG capsule Take 20 mg by mouth daily before breakfast.  05/19/19   [provider]  QUEtiapine  (SEROQUEL ) 300 MG tablet Take 300 mg by mouth at bedtime. 02/06/20   [provider]  simvastatin  (ZOCOR ) 20 MG tablet Take 20 mg by mouth daily.    [provider]  topiramate  (TOPAMAX )  100 MG tablet Take 50-100 mg by mouth See admin instructions. Take 1 tablet (100 mg) by mouth in the morning & take 0.5 tablet (50 mg) by mouth at night.    [provider]  vitamin B-12 (CYANOCOBALAMIN ) 1000 MCG tablet Take 1,000 mcg by mouth daily.    [provider]    Family History Family History  Problem Relation Age of Onset   Lung cancer Father    Heart attack Father    Lung cancer Paternal Aunt    Lung cancer Paternal Uncle    Heart disease Paternal Uncle    Breast cancer Neg Hx     Social History Social History   Tobacco Use   Smoking status: Never   Smokeless tobacco: Never  Vaping Use   Vaping status: Never Used  Substance Use Topics   Alcohol use: No    Alcohol/week: 0.0 standard drinks of alcohol   Drug use: No     Allergies   Compazine [prochlorperazine edisylate], Morphine and codeine, Famotidine , Doxycycline, Fluticasone, and Levofloxacin   Review of Systems Review of Systems  Constitutional:  Positive for activity change. Negative for appetite change, fatigue and fever.  Gastrointestinal:  Negative for abdominal pain, diarrhea, nausea and vomiting.  Musculoskeletal:  Negative for arthralgias and myalgias.  Skin:  Positive for wound.     Physical Exam Triage Vital Signs ED Triage Vitals  Encounter Vitals Group     BP 02/16/24 1149 93/69     Girls Systolic BP Percentile --      Girls Diastolic BP Percentile --      Boys Systolic BP Percentile --      Boys Diastolic BP Percentile --      Pulse Rate 02/16/24 1149 87     Resp 02/16/24 1149 18     Temp 02/16/24 1149 97.6 F (36.4 C)     Temp Source 02/16/24 1149 Tympanic     SpO2 02/16/24 1149 96 %     Weight --      Height --      Head Circumference --      Peak Flow --      Pain Score 02/16/24 1147 3     Pain Loc --      Pain Education --  Exclude from Growth Chart --    No data found.  Updated Vital Signs BP 93/69 (BP Location: Right Arm)   Pulse 87   Temp  97.6 F (36.4 C) (Tympanic)   Resp 18   LMP 01/19/2019 (Approximate) Comment: Over 2 years  SpO2 96%   Visual Acuity Right Eye Distance:   Left Eye Distance:   Bilateral Distance:    Right Eye Near:   Left Eye Near:    Bilateral Near:     Physical Exam Vitals reviewed.  Constitutional:      General: She is awake. She is not in acute distress.    Appearance: Normal appearance. She is well-developed. She is not ill-appearing.     Comments: Very pleasant female appears stated age in no acute distress sitting comfortably in exam room  HENT:     Head: Normocephalic and atraumatic.     Mouth/Throat:     Lips: Lesions present.     Pharynx: Uvula midline. No oropharyngeal exudate or posterior oropharyngeal erythema.      Comments: 1 cm ulcerated lesion with crusting with surrounding edema.  Area is tender to palpation.  No fluctuance noted.  No oral lesions.    Cardiovascular:     Rate and Rhythm: Normal rate and regular rhythm.     Heart sounds: Normal heart sounds, S1 normal and S2 normal. No murmur heard. Pulmonary:     Effort: Pulmonary effort is normal.     Breath sounds: Normal breath sounds. No wheezing, rhonchi or rales.     Comments: Clear to auscultation bilaterally  Psychiatric:        Behavior: Behavior is cooperative.      UC Treatments / Results  Labs (all labs ordered are listed, but only abnormal results are displayed) Labs Reviewed - No data to display  EKG   Radiology No results found.  Procedures Procedures (including critical care time)  Medications Ordered in UC Medications - No data to display  Initial Impression / Assessment and Plan / UC Course  I have reviewed the triage vital signs and the nursing notes.  Pertinent labs & imaging results that were available during my care of the patient were reviewed by me and considered in my medical decision making (see chart for details).     Patient is well-appearing, afebrile, nontoxic,  nontachycardic.  Given increasing swelling will cover with systemic antibiotics.  She was encouraged to keep the area clean and continue using mupirocin but will add cephalexin 500 mg 4 times daily.  We discussed that if the lesion changes in becomes larger, fluctuant, she develops fever, nausea, vomiting she should be seen immediately.  Strict return precautions given.  All questions were answered to patient satisfaction.  Final Clinical Impressions(s) / UC Diagnoses   Final diagnoses:  Impetigo     Discharge Instructions      Continue keeping the area clean with soap and water  and applying mupirocin.  Start cephalexin 4 times daily for 5 days.  If this changes in any way and it becomes larger, more painful, you develop fever, nausea, vomiting you need to be seen immediately.  Follow-up with your primary care next week.     ED Prescriptions     Medication Sig Dispense Auth. Provider   cephALEXin (KEFLEX) 500 MG capsule Take 1 capsule (500 mg total) by mouth 4 (four) times daily. 20 capsule Hillery Bhalla K, PA-C      PDMP not reviewed this encounter.   Rosalynn Sergent K,  PA-C 02/16/24 1236

## 2024-02-16 NOTE — Discharge Instructions (Signed)
 Continue keeping the area clean with soap and water  and applying mupirocin.  Start cephalexin 4 times daily for 5 days.  If this changes in any way and it becomes larger, more painful, you develop fever, nausea, vomiting you need to be seen immediately.  Follow-up with your primary care next week.

## 2024-02-28 ENCOUNTER — Ambulatory Visit
Admission: RE | Admit: 2024-02-28 | Discharge: 2024-02-28 | Disposition: A | Discharge status: Home / Self Care | Source: Ambulatory Visit | Attending: Internal Medicine | Admitting: Internal Medicine

## 2024-02-28 DIAGNOSIS — Z1231 Encounter for screening mammogram for malignant neoplasm of breast: Secondary | ICD-10-CM | POA: Diagnosis not present

## 2024-03-05 DIAGNOSIS — F902 Attention-deficit hyperactivity disorder, combined type: Secondary | ICD-10-CM | POA: Diagnosis not present

## 2024-03-05 DIAGNOSIS — F331 Major depressive disorder, recurrent, moderate: Secondary | ICD-10-CM | POA: Diagnosis not present

## 2024-03-05 DIAGNOSIS — F5104 Psychophysiologic insomnia: Secondary | ICD-10-CM | POA: Diagnosis not present

## 2024-04-07 ENCOUNTER — Encounter: Payer: Self-pay | Admitting: Internal Medicine

## 2024-04-07 ENCOUNTER — Inpatient Hospital Stay (HOSPITAL_BASED_OUTPATIENT_CLINIC_OR_DEPARTMENT_OTHER): Payer: BC Managed Care – PPO | Admitting: Internal Medicine

## 2024-04-07 ENCOUNTER — Inpatient Hospital Stay: Payer: BC Managed Care – PPO | Attending: Internal Medicine

## 2024-04-07 VITALS — BP 114/77 | HR 111 | Temp 97.0°F | Resp 16 | Ht 66.0 in | Wt 167.5 lb

## 2024-04-07 DIAGNOSIS — E538 Deficiency of other specified B group vitamins: Secondary | ICD-10-CM | POA: Insufficient documentation

## 2024-04-07 DIAGNOSIS — Z08 Encounter for follow-up examination after completed treatment for malignant neoplasm: Secondary | ICD-10-CM | POA: Diagnosis not present

## 2024-04-07 DIAGNOSIS — Z803 Family history of malignant neoplasm of breast: Secondary | ICD-10-CM

## 2024-04-07 DIAGNOSIS — M8580 Other specified disorders of bone density and structure, unspecified site: Secondary | ICD-10-CM | POA: Insufficient documentation

## 2024-04-07 DIAGNOSIS — Z9011 Acquired absence of right breast and nipple: Secondary | ICD-10-CM | POA: Insufficient documentation

## 2024-04-07 DIAGNOSIS — D509 Iron deficiency anemia, unspecified: Secondary | ICD-10-CM | POA: Diagnosis not present

## 2024-04-07 DIAGNOSIS — Z9221 Personal history of antineoplastic chemotherapy: Secondary | ICD-10-CM | POA: Insufficient documentation

## 2024-04-07 DIAGNOSIS — Z801 Family history of malignant neoplasm of trachea, bronchus and lung: Secondary | ICD-10-CM | POA: Diagnosis not present

## 2024-04-07 DIAGNOSIS — F418 Other specified anxiety disorders: Secondary | ICD-10-CM | POA: Diagnosis not present

## 2024-04-07 DIAGNOSIS — C50511 Malignant neoplasm of lower-outer quadrant of right female breast: Secondary | ICD-10-CM

## 2024-04-07 DIAGNOSIS — R002 Palpitations: Secondary | ICD-10-CM | POA: Insufficient documentation

## 2024-04-07 DIAGNOSIS — D649 Anemia, unspecified: Secondary | ICD-10-CM

## 2024-04-07 DIAGNOSIS — Z853 Personal history of malignant neoplasm of breast: Secondary | ICD-10-CM

## 2024-04-07 LAB — CMP (CANCER CENTER ONLY)
ALT: 28 U/L (ref 0–44)
AST: 24 U/L (ref 15–41)
Albumin: 4 g/dL (ref 3.5–5.0)
Alkaline Phosphatase: 114 U/L (ref 38–126)
Anion gap: 11 (ref 5–15)
BUN: 17 mg/dL (ref 6–20)
CO2: 22 mmol/L (ref 22–32)
Calcium: 9.5 mg/dL (ref 8.9–10.3)
Chloride: 104 mmol/L (ref 98–111)
Creatinine: 1 mg/dL (ref 0.44–1.00)
GFR, Estimated: >60 mL/min (ref >60)
Glucose, Bld: 226 mg/dL — ABNORMAL HIGH (ref 70–99)
Potassium: 3.6 mmol/L (ref 3.5–5.1)
Sodium: 137 mmol/L (ref 135–145)
Total Bilirubin: 0.6 mg/dL (ref 0.0–1.2)
Total Protein: 7.8 g/dL (ref 6.5–8.1)

## 2024-04-07 LAB — CBC WITH DIFFERENTIAL (CANCER CENTER ONLY)
Abs Immature Granulocytes: 0.04 K/uL (ref 0.00–0.07)
Basophils Absolute: 0 K/uL (ref 0.0–0.1)
Basophils Relative: 1 %
Eosinophils Absolute: 0.1 K/uL (ref 0.0–0.5)
Eosinophils Relative: 2 %
HCT: 41.5 % (ref 36.0–46.0)
Hemoglobin: 13 g/dL (ref 12.0–15.0)
Immature Granulocytes: 1 %
Lymphocytes Relative: 26 %
Lymphs Abs: 2.2 K/uL (ref 0.7–4.0)
MCH: 26.6 pg (ref 26.0–34.0)
MCHC: 31.3 g/dL (ref 30.0–36.0)
MCV: 84.9 fL (ref 80.0–100.0)
Monocytes Absolute: 0.4 K/uL (ref 0.1–1.0)
Monocytes Relative: 5 %
Neutro Abs: 5.9 K/uL (ref 1.7–7.7)
Neutrophils Relative %: 65 %
Platelet Count: 367 K/uL (ref 150–400)
RBC: 4.89 MIL/uL (ref 3.87–5.11)
RDW: 13.7 % (ref 11.5–15.5)
WBC Count: 8.8 K/uL (ref 4.0–10.5)
nRBC: 0 % (ref 0.0–0.2)

## 2024-04-07 LAB — IRON AND TIBC
Iron: 47 ug/dL (ref 28–170)
Saturation Ratios: 12 % (ref 10.4–31.8)
TIBC: 407 ug/dL (ref 250–450)
UIBC: 360 ug/dL

## 2024-04-07 LAB — FERRITIN: Ferritin: 15 ng/mL (ref 11–307)

## 2024-04-07 LAB — VITAMIN B12: Vitamin B-12: 983 pg/mL — ABNORMAL HIGH (ref 180–914)

## 2024-04-07 NOTE — Assessment & Plan Note (Addendum)
#   DEC 2020- Clinical stage IIB triple negative right breast cancer: s/p neoadjuvant chemotherapy followed by right total mastectomy and sentinel lymph node biopsy-pathology complete pathologic response. Stable.    # Clinically no evidence of recurrence.  Reconstruction-right breast May 2022.  JULY 2025- LEFT mammogram- WNL. stable  #  Normocytic anemia: S/p IV iron  infusion- [feb 2021] ? OCT 2022- Hb 11- 12.-.  Today hemoglobin is 13.  Monitor for now  # Depression/anxiety-multiple psychiatric medication [[facility]]; clinically stable             # B12 deficiency: B12 - high; on p.o. B12; recommend PO QD  # SEP 2024-- The BMD-  score of -1.9. This patient is considered osteopenic Discussed the potential risk factors for osteoporosis- age/gender/postmenopausal status/use of chemotherapy. Discussed multiple options including exercise/ calcium  and vitamin D  supplementation/ and also use of bi-sphosphonates- IV [will need dental clearance]or PO [GERD]. Discussed For now recommend monitoring for now- will repeat BMD in 2-3 years.   # DISPOSITION: # follow up in 12 months- MD; labs- cbc/cmp;iron  studies;ferritin; B12 ; ca-27-29; JULY 2026 Left sided screening mammogram; Dr.B

## 2024-04-07 NOTE — Progress Notes (Signed)
 [facility] CONSULT NOTE  Patient Care Team: [facility] as PCP - General End, Lonni, MD as PCP - Cardiology (Cardiology) Rennie Cindy SAUNDERS, MD as Consulting Physician (Oncology) Rodolph Romano, MD as Consulting Physician (General Surgery)  CHIEF COMPLAINTS/PURPOSE OF CONSULTATION: Triple negative breast cancer  #  Oncology History Overview Note  #  Clinical stage IIB triple negative right breast cancer: s/p neoadjuvant chemotherapy followed by right total mastectomy and sentinel lymph node biopsy.             Pathology revealed a complete pathologic remission.             She is s/p removal of right tissue expander.             Clinically, she continues to do well.                 Exam reveals no evidence of recurrent disease.             Encourage monthly breast self-exam.             Left mammogram on 08/09/2020 revealed no evidence of malignancy.             She had a port removed by Dr. Loreda is on 10/06/2020.              # She underwent left breast reduction and exchange of right breast tissue expander for gel implant with Dr. Elisabeth on 06/27/2021.    Carcinoma of lower-outer quadrant of right breast in female, estrogen receptor negative (HCC)  09/28/2019 Initial Diagnosis   Carcinoma of lower-outer quadrant of right breast in female, estrogen receptor negative (HCC)     HISTORY OF PRESENTING ILLNESS: ALone.  Ambulating independently.  Heather Bell 48 y.o.  female history of triple negative breast cancer stage II currently on surveillance; history of iron  deficiency anemia is here for follow-up-reviewed results of the mammogram and also bone density.  Patient admits to intermittent palpitations-currently following up with cardiology.   Otherwise denies any new lumps or bumps.  Otherwise patient denies any blood in stools or black or stools.  Denies any unusual shortness of breath or cough.  Denies any swelling in the  legs.  Review of Systems  Constitutional:  Positive for malaise/fatigue. Negative for chills, diaphoresis, fever and weight loss.  HENT:  Negative for nosebleeds and sore throat.   Eyes:  Negative for double vision.  Respiratory:  Negative for cough, hemoptysis, sputum production, shortness of breath and wheezing.   Cardiovascular:  Negative for chest pain, palpitations, orthopnea and leg swelling.  Gastrointestinal:  Negative for abdominal pain, blood in stool, constipation, diarrhea, heartburn, melena, nausea and vomiting.  Genitourinary:  Negative for dysuria, frequency and urgency.  Musculoskeletal:  Positive for joint pain.  Skin: Negative.  Negative for itching and rash.  Neurological:  Negative for dizziness, tingling, focal weakness, weakness and headaches.  Endo/Heme/Allergies:  Does not bruise/bleed easily.  Psychiatric/Behavioral:  Negative for depression. The patient is not nervous/anxious and does not have insomnia.      MEDICAL HISTORY:  Past Medical History:  Diagnosis Date   Allergic rhinitis    Anxiety    Bipolar disorder (HCC)    depression   Breast cancer (HCC) 03/21/2020   Right mastectomy   Cancer (HCC)    breast right   Depression    Diabetes (HCC)    GERD (gastroesophageal reflux disease)    Hyperlipidemia    Personal history  of chemotherapy    Jan 2021 through July 2021    SURGICAL HISTORY: Past Surgical History:  Procedure Laterality Date   BREAST BIOPSY Right 07/22/2019   mass bx, path pending, heart marker   BREAST BIOPSY Right 07/22/2019   LN bx, path pending,  butterfly hydromarker   BREAST RECONSTRUCTION WITH PLACEMENT OF TISSUE EXPANDER AND FLEX HD (ACELLULAR HYDRATED DERMIS) Right 03/21/2020   Procedure: RIGHT BREAST RECONSTRUCTION WITH PLACEMENT OF TISSUE EXPANDER AND FLEX HD (ACELLULAR HYDRATED DERMIS);  Surgeon: Elisabeth Craig RAMAN, MD;  Location: [facility];  Service: Plastics;  Laterality: Right;   BREAST RECONSTRUCTION WITH PLACEMENT OF  TISSUE EXPANDER AND FLEX HD (ACELLULAR HYDRATED DERMIS) Right 12/27/2020   Procedure: BREAST RECONSTRUCTION WITH PLACEMENT OF TISSUE EXPANDER AND FLEX HD (ACELLULAR HYDRATED DERMIS);  Surgeon: Elisabeth Craig RAMAN, MD;  Location: [facility];  Service: Plastics;  Laterality: Right;   BREAST REDUCTION SURGERY Left 06/27/2021   Procedure: Left breast reduction;  Surgeon: Elisabeth Craig RAMAN, MD;  Location: [facility];  Service: Plastics;  Laterality: Left;   IMAGE GUIDED SINUS SURGERY     INCISION AND DRAINAGE OF WOUND Right 05/02/2020   Procedure: IRRIGATION AND DEBRIDEMENT WOUND WITH REMOVAL OF RIGHT TISSUE EXPANDER;  Surgeon: Elisabeth Craig RAMAN, MD;  Location: [facility];  Service: Plastics;  Laterality: Right;  1 hour please   KNEE SURGERY     MASTECTOMY Right 03/21/2020   NASAL SEPTUM SURGERY     PORTACATH PLACEMENT N/A 08/07/2019   Procedure: INSERTION PORT-A-CATH;  Surgeon: Rodolph Romano, MD;  Location: [facility];  Service: General;  Laterality: N/A;   REDUCTION MAMMAPLASTY     REMOVAL OF TISSUE EXPANDER AND PLACEMENT OF IMPLANT Right 06/27/2021   Procedure: Removal right breast tissue expander and placement of gel implant;  Surgeon: Elisabeth Craig RAMAN, MD;  Location: [facility];  Service: Plastics;  Laterality: Right;   TONSILLECTOMY     TOTAL MASTECTOMY Right 03/21/2020   Procedure: TOTAL MASTECTOMY;  Surgeon: Rodolph Romano, MD;  Location: [facility];  Service: General;  Laterality: Right;    SOCIAL HISTORY: Social History   Socioeconomic History   Marital status: Married    Spouse name: Not on file   Number of children: Not on file   Years of education: Not on file   Highest education level: Not on file  Occupational History   Not on file  Tobacco Use   Smoking status: Never   Smokeless tobacco: Never  Vaping Use   Vaping status: Never Used  Substance and Sexual Activity   Alcohol use: No    Alcohol/week: 0.0  standard drinks of alcohol   Drug use: No   Sexual activity: Yes  Other Topics Concern   Not on file  Social History Narrative   Not on file   Social Drivers of Health   Financial Resource Strain: Not on file  Food Insecurity: Not on file  Transportation Needs: No Transportation Needs (01/19/2022)   PRAPARE - Administrator, Civil Service (Medical): No    Lack of Transportation (Non-Medical): No  Physical Activity: Not on file  Stress: Not on file  Social Connections: Not on file  Intimate Partner Violence: Not on file    FAMILY HISTORY: Family History  Problem Relation Age of Onset   Lung cancer Father    Heart attack Father    Lung cancer Paternal Aunt    Lung cancer Paternal Uncle    Heart  disease Paternal Uncle    Breast cancer Neg Hx     ALLERGIES:  is allergic to compazine [prochlorperazine edisylate], morphine and codeine, famotidine , doxycycline, fluticasone, and levofloxacin.  MEDICATIONS:  Current Outpatient Medications  Medication Sig Dispense Refill   Accu-Chek FastClix Lancets MISC See admin instructions.     ACCU-CHEK GUIDE test strip SMARTSIG:1 Strip(s) Via Meter Daily PRN     amphetamine -dextroamphetamine  (ADDERALL XR) 25 MG 24 hr capsule Take 25 mg by mouth daily.     azelastine (ASTELIN) 0.1 % nasal spray Place 1 spray into both nostrils daily.     Blood Glucose Monitoring Suppl (ACCU-CHEK GUIDE ME) w/Device KIT USE 1 DEVICE ONCE DAILY AS DIRECTED     busPIRone (BUSPAR) 10 MG tablet Take 10 mg by mouth 2 (two) times daily.     Calcium  Carb-Cholecalciferol (CALCIUM  600+D) 600-800 MG-UNIT TABS Take 1 tablet by mouth daily.     cetirizine (ZYRTEC) 10 MG tablet Take 10 mg by mouth daily.      diazepam (VALIUM) 10 MG tablet Take 10 mg by mouth. As needed prior to procedures (Patient taking differently: Take 10 mg by mouth daily as needed. As needed prior to procedures)     doxepin  (SINEQUAN ) 50 MG capsule Take 150 mg by mouth at bedtime.      estradiol  (ESTRACE ) 0.1 MG/GM vaginal cream Insert 1g nightly for 1 wk, then 1 g once weekly as maintenance; can apply pea sized amount externally at vaginal opening weekly 42.5 g 1   ibuprofen  (ADVIL ) 600 MG tablet Take 1 tablet (600 mg total) by mouth every 6 (six) hours as needed. 30 tablet 0   JARDIANCE 10 MG TABS tablet Take 10 mg by mouth daily.     lamoTRIgine  (LAMICTAL ) 150 MG tablet Take 150 mg by mouth daily.     lurasidone  (LATUDA ) 80 MG TABS tablet Take 80 mg by mouth daily.     metFORMIN  (GLUCOPHAGE -XR) 500 MG 24 hr tablet Take 500 mg by mouth 2 (two) times daily.     mometasone (NASONEX) 50 MCG/ACT nasal spray Place 2 sprays into the nose daily as needed (allergies).      Multiple Vitamin (MULTIVITAMIN WITH MINERALS) TABS tablet Take 1 tablet by mouth daily.     omeprazole (PRILOSEC) 20 MG capsule Take 20 mg by mouth daily before breakfast.      QUEtiapine  (SEROQUEL ) 300 MG tablet Take 300 mg by mouth at bedtime.     simvastatin  (ZOCOR ) 20 MG tablet Take 20 mg by mouth daily.     topiramate  (TOPAMAX ) 100 MG tablet Take 50 mg by mouth 2 (two) times daily. Take 1 tablet (100 mg) by mouth in the morning & take 0.5 tablet (50 mg) by mouth at night.     vitamin B-12 (CYANOCOBALAMIN ) 1000 MCG tablet Take 1,000 mcg by mouth daily. (Patient taking differently: Take 1,000 mcg by mouth 3 (three) times a week.)     No current facility-administered medications for this visit.   Facility-Administered Medications Ordered in Other Visits  Medication Dose Route Frequency Provider Last Rate Last Admin   sodium chloride  flush (NS) 0.9 % injection 10 mL  10 mL Intravenous PRN Corcoran, Melissa C, MD   10 mL at 01/08/20 1314      .  PHYSICAL EXAMINATION: ECOG PERFORMANCE STATUS: 0 - Asymptomatic  Vitals:   04/07/24 0909  BP: 114/77  Pulse: (!) 111  Resp: 16  Temp: (!) 97 F (36.1 C)  SpO2: 98%  Filed Weights   04/07/24 0909  Weight: 167 lb 8 oz (76 kg)     Physical Exam Vitals  and nursing note reviewed.  Constitutional:      Comments:       HENT:     Head: Normocephalic and atraumatic.     Mouth/Throat:     Pharynx: Oropharynx is clear.  Eyes:     Extraocular Movements: Extraocular movements intact.     Pupils: Pupils are equal, round, and reactive to light.  Cardiovascular:     Rate and Rhythm: Normal rate and regular rhythm.  Pulmonary:     Comments: Decreased breath sounds bilaterally.  Abdominal:     Palpations: Abdomen is soft.  Musculoskeletal:        General: Normal range of motion.     Cervical back: Normal range of motion.  Skin:    General: Skin is warm.  Neurological:     General: No focal deficit present.     Mental Status: She is alert and oriented to person, place, and time.  Psychiatric:        Behavior: Behavior normal.        Judgment: Judgment normal.     LABORATORY DATA:  I have reviewed the data as listed Lab Results  Component Value Date   WBC 8.8 04/07/2024   HGB 13.0 04/07/2024   HCT 41.5 04/07/2024   MCV 84.9 04/07/2024   PLT 367 04/07/2024   Recent Labs    04/08/23 1513 04/07/24 0913  NA 135 137  K 3.8 3.6  CL 106 104  CO2 22 22  GLUCOSE 161* 226*  BUN 14 17  CREATININE 0.94 1.00  CALCIUM  8.8* 9.5  GFRNONAA >60 >60  PROT 7.5 7.8  ALBUMIN 4.1 4.0  AST 27 24  ALT 33 28  ALKPHOS 87 114  BILITOT 0.2* 0.6    RADIOGRAPHIC STUDIES: I have personally reviewed the radiological images as listed and agreed with the findings in the report. No results found.  ASSESSMENT & PLAN:   Carcinoma of lower-outer quadrant of right breast in female, estrogen receptor negative (HCC) # DEC 2020- Clinical stage IIB triple negative right breast cancer: s/p neoadjuvant chemotherapy followed by right total mastectomy and sentinel lymph node biopsy-pathology complete pathologic response. Stable.    # Clinically no evidence of recurrence.  Reconstruction-right breast May 2022.  JULY 2025- LEFT mammogram- WNL. stable  #   Normocytic anemia: S/p IV iron  infusion- [feb 2021] ? OCT 2022- Hb 11- 12.-.  Today hemoglobin is 13.  Monitor for now  # Depression/anxiety-multiple psychiatric medication [[facility]]; clinically stable             # B12 deficiency: B12 - high; on p.o. B12; recommend PO QD  # SEP 2024-- The BMD-  score of -1.9. This patient is considered osteopenic Discussed the potential risk factors for osteoporosis- age/gender/postmenopausal status/use of chemotherapy. Discussed multiple options including exercise/ calcium  and vitamin D  supplementation/ and also use of bi-sphosphonates- IV [will need dental clearance]or PO [GERD]. Discussed For now recommend monitoring for now- will repeat BMD in 2-3 years.   # DISPOSITION: # follow up in 12 months- MD; labs- cbc/cmp;iron  studies;ferritin; B12 ; ca-27-29; JULY 2026 Left sided screening mammogram; Dr.B     All questions were answered. The patient knows to call the clinic with any problems, questions or concerns.    Cindy JONELLE Joe, MD 04/07/2024 10:42 AM

## 2024-04-07 NOTE — Progress Notes (Signed)
 Mammogram 02/28/24, BMD 05/15/23.  C/o heart flutter, pressure and beating fast since 2/25, comes and goes, seeing Dr. Abigail cardiology at [facility].

## 2024-04-08 LAB — CANCER ANTIGEN 27.29: CA 27.29: 12.8 U/mL (ref 0.0–38.6)

## 2024-04-14 ENCOUNTER — Ambulatory Visit: Attending: Physician Assistant | Admitting: Physician Assistant

## 2024-04-14 ENCOUNTER — Encounter: Payer: Self-pay | Admitting: Physician Assistant

## 2024-04-14 ENCOUNTER — Ambulatory Visit

## 2024-04-14 VITALS — BP 100/64 | HR 112 | Ht 66.0 in | Wt 168.0 lb

## 2024-04-14 DIAGNOSIS — R002 Palpitations: Secondary | ICD-10-CM | POA: Diagnosis not present

## 2024-04-14 DIAGNOSIS — I340 Nonrheumatic mitral (valve) insufficiency: Secondary | ICD-10-CM

## 2024-04-14 DIAGNOSIS — Z9189 Other specified personal risk factors, not elsewhere classified: Secondary | ICD-10-CM

## 2024-04-14 NOTE — Patient Instructions (Signed)
 Medication Instructions:  Your physician recommends that you continue on your current medications as directed. Please refer to the Current Medication list given to you today.   *If you need a refill on your cardiac medications before your next appointment, please call your pharmacy*  Lab Work: None ordered at this time   Testing/Procedures: Your physician has recommended that you wear a Zio monitor.   This monitor is a medical device that records the heart's electrical activity. Doctors most often use these monitors to diagnose arrhythmias. Arrhythmias are problems with the speed or rhythm of the heartbeat. The monitor is a small device applied to your chest. You can wear one while you do your normal daily activities. While wearing this monitor if you have any symptoms to push the button and record what you felt. Once you have worn this monitor for the period of time provider prescribed (Usually 14 days), you will return the monitor device in the postage paid box. Once it is returned they will download the data collected and provide us  with a report which the provider will then review and we will call you with those results. Important tips:  Avoid showering during the first 24 hours of wearing the monitor. Avoid excessive sweating to help maximize wear time. Do not submerge the device, no hot tubs, and no swimming pools. Keep any lotions or oils away from the patch. After 24 hours you may shower with the patch on. Take brief showers with your back facing the shower head.  Do not remove patch once it has been placed because that will interrupt data and decrease adhesive wear time. Push the button when you have any symptoms and write down what you were feeling. Once you have completed wearing your monitor, remove and place into box which has postage paid and place in your outgoing mailbox.  If for some reason you have misplaced your box then call our office and we can provide another box and/or  mail it off for you.   Follow-Up: At [facility], you and your health needs are our priority.  As part of our continuing mission to provide you with exceptional heart care, our providers are all part of one team.  This team includes your primary Cardiologist (physician) and Advanced Practice Providers or APPs (Physician Assistants and Nurse Practitioners) who all work together to provide you with the care you need, when you need it.  Your next appointment:   5-6 week(s)  Provider:   You may see Lonni Hanson, MD or Bernardino Bring, PA-C

## 2024-04-14 NOTE — Progress Notes (Signed)
 Cardiology Office Note    Date:  04/14/2024   ID:  Ieasha, Boerema 1976/07/26, MRN [id number]  PCP:  Autry Grayce LABOR, PA  Cardiologist:  Lonni Hanson, MD  Electrophysiologist:  None   Chief Complaint: Follow-up  History of Present Illness:   Heather Bell is a 48 y.o. female with history of mitral regurgitation, breast cancer status post neoadjuvant chemotherapy followed by right total mastectomy with reconstruction, DM2, HLD, normocytic anemia, and depression who presents for follow-up of echo.   Prior echo in 07/2019 demonstrated an EF of 50 to 55%, global hypokinesis, normal RV systolic function and ventricular cavity size, trivial mitral regurgitation, and an estimated right atrial pressure of 3 mmHg.  Due to suboptimal images, we have been following her EF with serial MUGA scans.  Most recent MUGA scan on 08/16/2021 demonstrated an EF of 53% which was stable when compared to study in 01/2021, though was slightly down when compared to study from 01/2020.  She underwent removal of right breast tissue expander and replacement of gel implant as well as breast reduction in 06/2021.  Echo on 02/14/2022 demonstrated an EF of 50 to 55%, no regional wall motion abnormalities, normal LV diastolic function parameters, normal RV systolic function and ventricular cavity size, trivial mitral regurgitation, and an estimated right atrial pressure of 3 mmHg.  Echo in 01/2023 demonstrated an EF of 50 to 55%, no regional wall motion abnormalities, grade 1 diastolic dysfunction, normal RV systolic function and ventricular cavity size, no significant valvular abnormalities, and an estimated right atrial pressure of 3 mmHg.  She was last seen in the office in 03/2023 and continued to do well from a cardiac perspective.  Echo from 01/2024 showed a preserved LV systolic function with an EF of 55 to 60%, no regional wall motion abnormalities, normal RV systolic function and ventricular cavity size, no evidence of mitral  regurgitation, and an estimated right atrial pressure of 3 mmHg.  She comes in today and is without symptoms of angina or cardiac decompensation.  She does notice some intermittent, randomly occurring tachypalpitations.  She has had 2 episodes this month, with the first episode occurring while sitting down with her smart watch indicating a heart rate around 150 bpm.  Palpitations lasted for several minutes followed by spontaneous resolution.  She believes the second episode lasted for approximately 30 seconds or so.  She was not wearing her watch with this second episode.  No associated dizziness, presyncope, or syncope.  She also notes an intermittent sensation of a skipped beat.  No significant lower extremity swelling, falls, or symptoms concerning for bleeding.  She drinks 2 to 3 glasses of sweet tea daily.  Rarely drinks alcohol socially.  No energy drinks.   Labs independently reviewed: 03/2024 - potassium 3.6, BUN 17, serum creatinine 1.00, albumin 4.0, AST/ALT normal, Hgb 13.0, PLT 367 09/2019 - TSH normal  Past Medical History:  Diagnosis Date   Allergic rhinitis    Anxiety    Bipolar disorder (HCC)    depression   Breast cancer (HCC) 03/21/2020   Right mastectomy   Cancer (HCC)    breast right   Depression    Diabetes (HCC)    GERD (gastroesophageal reflux disease)    Hyperlipidemia    Personal history of chemotherapy    Jan 2021 through July 2021    Past Surgical History:  Procedure Laterality Date   BREAST BIOPSY Right 07/22/2019   mass bx, path pending, heart marker  BREAST BIOPSY Right 07/22/2019   LN bx, path pending,  butterfly hydromarker   BREAST RECONSTRUCTION WITH PLACEMENT OF TISSUE EXPANDER AND FLEX HD (ACELLULAR HYDRATED DERMIS) Right 03/21/2020   Procedure: RIGHT BREAST RECONSTRUCTION WITH PLACEMENT OF TISSUE EXPANDER AND FLEX HD (ACELLULAR HYDRATED DERMIS);  Surgeon: Elisabeth Craig RAMAN, MD;  Location: [facility];  Service: Plastics;  Laterality: Right;   BREAST  RECONSTRUCTION WITH PLACEMENT OF TISSUE EXPANDER AND FLEX HD (ACELLULAR HYDRATED DERMIS) Right 12/27/2020   Procedure: BREAST RECONSTRUCTION WITH PLACEMENT OF TISSUE EXPANDER AND FLEX HD (ACELLULAR HYDRATED DERMIS);  Surgeon: Elisabeth Craig RAMAN, MD;  Location: [facility];  Service: Plastics;  Laterality: Right;   BREAST REDUCTION SURGERY Left 06/27/2021   Procedure: Left breast reduction;  Surgeon: Elisabeth Craig RAMAN, MD;  Location: [facility];  Service: Plastics;  Laterality: Left;   IMAGE GUIDED SINUS SURGERY     INCISION AND DRAINAGE OF WOUND Right 05/02/2020   Procedure: IRRIGATION AND DEBRIDEMENT WOUND WITH REMOVAL OF RIGHT TISSUE EXPANDER;  Surgeon: Elisabeth Craig RAMAN, MD;  Location: [facility];  Service: Plastics;  Laterality: Right;  1 hour please   KNEE SURGERY     MASTECTOMY Right 03/21/2020   NASAL SEPTUM SURGERY     PORTACATH PLACEMENT N/A 08/07/2019   Procedure: INSERTION PORT-A-CATH;  Surgeon: Rodolph Romano, MD;  Location: [facility];  Service: General;  Laterality: N/A;   REDUCTION MAMMAPLASTY     REMOVAL OF TISSUE EXPANDER AND PLACEMENT OF IMPLANT Right 06/27/2021   Procedure: Removal right breast tissue expander and placement of gel implant;  Surgeon: Elisabeth Craig RAMAN, MD;  Location: [facility];  Service: Plastics;  Laterality: Right;   TONSILLECTOMY     TOTAL MASTECTOMY Right 03/21/2020   Procedure: TOTAL MASTECTOMY;  Surgeon: Rodolph Romano, MD;  Location: [facility];  Service: General;  Laterality: Right;    Current Medications: Current Meds  Medication Sig   Accu-Chek FastClix Lancets MISC See admin instructions.   ACCU-CHEK GUIDE test strip SMARTSIG:1 Strip(s) Via Meter Daily PRN   amphetamine -dextroamphetamine  (ADDERALL XR) 25 MG 24 hr capsule Take 25 mg by mouth daily.   azelastine (ASTELIN) 0.1 % nasal spray Place 1 spray into both nostrils daily.   Blood Glucose Monitoring Suppl (ACCU-CHEK GUIDE ME)  w/Device KIT USE 1 DEVICE ONCE DAILY AS DIRECTED   busPIRone (BUSPAR) 10 MG tablet Take 10 mg by mouth 2 (two) times daily.   Calcium  Carb-Cholecalciferol (CALCIUM  600+D) 600-800 MG-UNIT TABS Take 1 tablet by mouth daily.   cetirizine (ZYRTEC) 10 MG tablet Take 10 mg by mouth daily.    diazepam (VALIUM) 10 MG tablet Take 10 mg by mouth. As needed prior to procedures (Patient taking differently: Take 10 mg by mouth daily as needed. As needed prior to procedures)   doxepin  (SINEQUAN ) 50 MG capsule Take 150 mg by mouth at bedtime.   estradiol  (ESTRACE ) 0.1 MG/GM vaginal cream Insert 1g nightly for 1 wk, then 1 g once weekly as maintenance; can apply pea sized amount externally at vaginal opening weekly   ibuprofen  (ADVIL ) 600 MG tablet Take 1 tablet (600 mg total) by mouth every 6 (six) hours as needed.   JARDIANCE 10 MG TABS tablet Take 10 mg by mouth daily.   lamoTRIgine  (LAMICTAL ) 150 MG tablet Take 150 mg by mouth daily.   lurasidone  (LATUDA ) 80 MG TABS tablet Take 80 mg by mouth daily.   metFORMIN  (GLUCOPHAGE -XR) 500 MG 24 hr tablet Take 500 mg by  mouth 2 (two) times daily.   mometasone (NASONEX) 50 MCG/ACT nasal spray Place 2 sprays into the nose daily as needed (allergies).    Multiple Vitamin (MULTIVITAMIN WITH MINERALS) TABS tablet Take 1 tablet by mouth daily.   omeprazole (PRILOSEC) 20 MG capsule Take 20 mg by mouth daily before breakfast.    QUEtiapine  (SEROQUEL ) 300 MG tablet Take 300 mg by mouth at bedtime.   simvastatin  (ZOCOR ) 20 MG tablet Take 20 mg by mouth daily.   topiramate  (TOPAMAX ) 100 MG tablet Take 50 mg by mouth 2 (two) times daily. Take 1 tablet (100 mg) by mouth in the morning & take 0.5 tablet (50 mg) by mouth at night.   vitamin B-12 (CYANOCOBALAMIN ) 1000 MCG tablet Take 1,000 mcg by mouth daily. (Patient taking differently: Take 1,000 mcg by mouth 3 (three) times a week.)    Allergies:   Compazine [prochlorperazine edisylate], Morphine and codeine, Famotidine ,  Doxycycline, Fluticasone, and Levofloxacin   Social History   Socioeconomic History   Marital status: Married    Spouse name: Not on file   Number of children: Not on file   Years of education: Not on file   Highest education level: Not on file  Occupational History   Not on file  Tobacco Use   Smoking status: Never   Smokeless tobacco: Never  Vaping Use   Vaping status: Never Used  Substance and Sexual Activity   Alcohol use: No    Alcohol/week: 0.0 standard drinks of alcohol   Drug use: No   Sexual activity: Yes  Other Topics Concern   Not on file  Social History Narrative   Not on file   Social Drivers of Health   Financial Resource Strain: Not on file  Food Insecurity: Not on file  Transportation Needs: No Transportation Needs (01/19/2022)   PRAPARE - Transportation    Lack of Transportation (Medical): No    Lack of Transportation (Non-Medical): No  Physical Activity: Not on file  Stress: Not on file  Social Connections: Not on file     Family History:  The patient's family history includes Heart attack in her father; Heart disease in her paternal uncle; Lung cancer in her father, paternal aunt, and paternal uncle. There is no history of Breast cancer.  ROS:   12-point review of systems is negative unless otherwise noted in the HPI.   EKGs/Labs/Other Studies Reviewed:    Studies reviewed were summarized above. The additional studies were reviewed today:  01/28/2024: 1. Left ventricular ejection fraction, by estimation, is 55 to 60%. The  left ventricle has normal function. The left ventricle has no regional  wall motion abnormalities. Left ventricular diastolic parameters are  indeterminate.   2. Right ventricular systolic function is normal. The right ventricular  size is normal.   3. The mitral valve is normal in structure. No evidence of mitral valve  regurgitation.   4. The aortic valve is tricuspid. Aortic valve regurgitation is not  visualized.    5. The inferior vena cava is normal in size with greater than 50%  respiratory variability, suggesting right atrial pressure of 3 mmHg.  __________  2D echo 02/15/2023: 1. Left ventricular ejection fraction, by estimation, is 50 to 55%. The  left ventricle has low normal function. The left ventricle has no regional  wall motion abnormalities. Left ventricular diastolic parameters are  consistent with Grade I diastolic  dysfunction (impaired relaxation). The average left ventricular global  longitudinal strain is -17.1 %. The global longitudinal  strain is normal.   2. Right ventricular systolic function is normal. The right ventricular  size is normal.   3. The mitral valve is normal in structure. No evidence of mitral valve  regurgitation.   4. The aortic valve is tricuspid. Aortic valve regurgitation is not  visualized.   5. The inferior vena cava is normal in size with greater than 50%  respiratory variability, suggesting right atrial pressure of 3 mmHg.  __________   2D echo 02/14/2022: 1. Left ventricular ejection fraction, by estimation, is 50 to 55%. The  left ventricle has low normal function. The left ventricle has no regional  wall motion abnormalities. Left ventricular diastolic parameters were  normal.   2. Right ventricular systolic function is normal. The right ventricular  size is normal.   3. The mitral valve is normal in structure. Trivial mitral valve  regurgitation.   4. The aortic valve is tricuspid. Aortic valve regurgitation is not  visualized.   5. The inferior vena cava is normal in size with greater than 50%  respiratory variability, suggesting right atrial pressure of 3 mmHg. __________   2D echo 08/13/2019: 1. Left ventricular ejection fraction, by visual estimation, is 50 to  55%. The left ventricle has normal function. There is no left ventricular  hypertrophy.   2. The left ventricle demonstrates global hypokinesis.   3. Global right ventricle has  normal systolic function.The right  ventricular size is normal. No increase in right ventricular wall  thickness.   4. Left atrial size was normal.   5. Right atrial size was normal.   6. The mitral valve is normal in structure. Trivial mitral valve  regurgitation. No evidence of mitral stenosis.   7. The tricuspid valve is normal in structure.   8. The aortic valve is normal in structure. Aortic valve regurgitation is  not visualized. No evidence of aortic valve sclerosis or stenosis.   9. The pulmonic valve was normal in structure. Pulmonic valve  regurgitation is not visualized.  10. The inferior vena cava is normal in size with greater than 50%  respiratory variability, suggesting right atrial pressure of 3 mmHg.   EKG:  EKG is ordered today.  The EKG ordered today demonstrates sinus tachycardia, 112 bpm, poor R wave progression along the precordial leads, no acute ST-T changes  Recent Labs: 04/07/2024: ALT 28; BUN 17; Creatinine 1.00; Hemoglobin 13.0; Platelet Count 367; Potassium 3.6; Sodium 137  Recent Lipid Panel No results found for: CHOL, TRIG, HDL, CHOLHDL, VLDL, LDLCALC, LDLDIRECT  PHYSICAL EXAM:    VS:  BP 100/64 (BP Location: Left Arm, Patient Position: Sitting, Cuff Size: Normal)   Pulse (!) 112   Ht 5' 6 (1.676 m)   Wt 168 lb (76.2 kg)   LMP 01/19/2019 (Approximate) Comment: Over 2 years  SpO2 98%   BMI 27.12 kg/m   BMI: Body mass index is 27.12 kg/m.  Physical Exam Vitals reviewed.  Constitutional:      Appearance: She is well-developed.  HENT:     Head: Normocephalic and atraumatic.  Eyes:     General:        Right eye: No discharge.        Left eye: No discharge.  Cardiovascular:     Rate and Rhythm: Normal rate and regular rhythm.     Pulses:          Posterior tibial pulses are 2+ on the right side and 2+ on the left side.     Heart  sounds: Normal heart sounds, S1 normal and S2 normal. Heart sounds not distant. No midsystolic click  and no opening snap. No murmur heard.    No friction rub.  Pulmonary:     Effort: Pulmonary effort is normal. No respiratory distress.     Breath sounds: Normal breath sounds. No decreased breath sounds, wheezing, rhonchi or rales.  Musculoskeletal:     Cervical back: Normal range of motion.     Right lower leg: No edema.     Left lower leg: No edema.  Skin:    General: Skin is warm and dry.     Nails: There is no clubbing.  Neurological:     Mental Status: She is alert and oriented to person, place, and time.  Psychiatric:        Speech: Speech normal.        Behavior: Behavior normal.        Thought Content: Thought content normal.        Judgment: Judgment normal.     Wt Readings from Last 3 Encounters:  04/14/24 168 lb (76.2 kg)  04/07/24 167 lb 8 oz (76 kg)  08/06/23 175 lb (79.4 kg)     ASSESSMENT & PLAN:   Tachypalpitations: Schedule Zio patch.  Recent echo with preserved LV systolic function.  Based on findings, may need to trend labs.  Defer addition of AV nodal blockade for now in an effort to not to mask findings.  Breast cancer with abnormal echo and at risk for cardiomyopathy: She continues to do well from a cardiac perspective, and is without symptoms of angina or decompensation. She has completed treatment of her breast cancer with initial concern for mildly reduced LVEF by echo in 2020 as outlined above. Subsequent MUGA scans have demonstrated stable, low normal LV systolic function.   Echo from 01/2024 showed normal LV systolic function with an EF of 55 to 60%.  She continues to do well and is without symptoms of angina or cardiac decompensation.  Given normalization of LV systolic function, and in the context of her being asymptomatic, defer addition of beta-blocker or ARB at this time in an effort to minimize significant hypotension.  Defer ischemic testing given lack of anginal symptoms with normal EF.  Should she become symptomatic, or have any new decline in EF  down the road, we would need to consider ischemic evaluation.  Mitral regurgitation: No significant valvular abnormality noted on echo in 01/2024.     Disposition: F/u with Dr. Mady or an APP in 4-6 weeks Zio patch.   Medication Adjustments/Labs and Tests Ordered: Current medicines are reviewed at length with the patient today.  Concerns regarding medicines are outlined above. Medication changes, Labs and Tests ordered today are summarized above and listed in the Patient Instructions accessible in Encounters.   SignedBernardino Bring, PA-C 04/14/2024 11:06 AM     [facility] [place] [address] [place], [place] [address] [phone]

## 2024-04-16 DIAGNOSIS — F419 Anxiety disorder, unspecified: Secondary | ICD-10-CM | POA: Diagnosis not present

## 2024-04-16 DIAGNOSIS — F5104 Psychophysiologic insomnia: Secondary | ICD-10-CM | POA: Diagnosis not present

## 2024-04-16 DIAGNOSIS — F3181 Bipolar II disorder: Secondary | ICD-10-CM | POA: Diagnosis not present

## 2024-04-16 DIAGNOSIS — F902 Attention-deficit hyperactivity disorder, combined type: Secondary | ICD-10-CM | POA: Diagnosis not present

## 2024-05-09 ENCOUNTER — Ambulatory Visit: Payer: Self-pay | Admitting: Physician Assistant

## 2024-05-09 DIAGNOSIS — R002 Palpitations: Secondary | ICD-10-CM | POA: Diagnosis not present

## 2024-05-11 DIAGNOSIS — F3181 Bipolar II disorder: Secondary | ICD-10-CM | POA: Diagnosis not present

## 2024-05-11 DIAGNOSIS — F411 Generalized anxiety disorder: Secondary | ICD-10-CM | POA: Diagnosis not present

## 2024-05-11 DIAGNOSIS — F4010 Social phobia, unspecified: Secondary | ICD-10-CM | POA: Diagnosis not present

## 2024-05-12 DIAGNOSIS — F5104 Psychophysiologic insomnia: Secondary | ICD-10-CM | POA: Diagnosis not present

## 2024-05-12 DIAGNOSIS — F3181 Bipolar II disorder: Secondary | ICD-10-CM | POA: Diagnosis not present

## 2024-05-12 DIAGNOSIS — F902 Attention-deficit hyperactivity disorder, combined type: Secondary | ICD-10-CM | POA: Diagnosis not present

## 2024-05-12 DIAGNOSIS — F419 Anxiety disorder, unspecified: Secondary | ICD-10-CM | POA: Diagnosis not present

## 2024-05-25 NOTE — Progress Notes (Unsigned)
 Cardiology Office Note    Date:  05/26/2024   ID:  Heather Bell, DOB [birth year], MRN [id number]  PCP:  Autry Grayce LABOR, PA  Cardiologist:  Lonni Hanson, MD  Electrophysiologist:  None   Chief Complaint: Follow-up  History of Present Illness:   Heather Bell is a 48 y.o. female with history of mitral regurgitation, breast cancer status post neoadjuvant chemotherapy followed by right total mastectomy with reconstruction, DM2, HLD, normocytic anemia, and depression who presents for follow-up of Zio patch.   Prior echo in 07/2019 demonstrated an EF of 50 to 55%, global hypokinesis, normal RV systolic function and ventricular cavity size, trivial mitral regurgitation, and an estimated right atrial pressure of 3 mmHg.  Due to suboptimal images, we have been following her EF with serial MUGA scans.  Most recent MUGA scan on 08/16/2021 demonstrated an EF of 53% which was stable when compared to study in 01/2021, though was slightly down when compared to study from 01/2020.  She underwent removal of right breast tissue expander and replacement of gel implant as well as breast reduction in 06/2021.  Echo on 02/14/2022 demonstrated an EF of 50 to 55%, no regional wall motion abnormalities, normal LV diastolic function parameters, normal RV systolic function and ventricular cavity size, trivial mitral regurgitation, and an estimated right atrial pressure of 3 mmHg.  Echo in 01/2023 demonstrated an EF of 50 to 55%, no regional wall motion abnormalities, grade 1 diastolic dysfunction, normal RV systolic function and ventricular cavity size, no significant valvular abnormalities, and an estimated right atrial pressure of 3 mmHg.  Echo from 01/2024 showed a preserved LV systolic function with an EF of 55 to 60%, no regional wall motion abnormalities, normal RV systolic function and ventricular cavity size, no evidence of mitral regurgitation, and an estimated right atrial pressure of 3 mmHg.    She was last seen  in the office in 03/2024 and reported intermittent, randomly occurring tachypalpitations with her smart watch indicating a heart rate around 150 bpm.  She reported 2 episodes earlier that month lasting from 30 seconds to several minutes.  Zio patch in 03/2024 showed a predominant rhythm of sinus with rare PACs and PVCs.  Patient triggered events corresponded to sinus tachycardia and isolated PACs.  Patient presents today overall doing well from a cardiac perspective. We discussed the results of her heart monitor which were reassuring. She reports overall improvement in tachypalpitations.  She denies chest pain, shortness of breath, lightheadedness, dizziness, and lower extremity swelling.   Labs independently reviewed: 03/2024 - potassium 3.6, BUN 17, serum creatinine 1.00, albumin 4.0, AST/ALT normal, Hgb 13.0, PLT 367 09/2019 - TSH normal  Past Medical History:  Diagnosis Date   Allergic rhinitis    Anxiety    Bipolar disorder (HCC)    depression   Breast cancer (HCC) 03/21/2020   Right mastectomy   Cancer (HCC)    breast right   Depression    Diabetes (HCC)    GERD (gastroesophageal reflux disease)    Hyperlipidemia    Personal history of chemotherapy    Jan 2021 through July 2021    Past Surgical History:  Procedure Laterality Date   BREAST BIOPSY Right 07/22/2019   mass bx, path pending, heart marker   BREAST BIOPSY Right 07/22/2019   LN bx, path pending,  butterfly hydromarker   BREAST RECONSTRUCTION WITH PLACEMENT OF TISSUE EXPANDER AND FLEX HD (ACELLULAR HYDRATED DERMIS) Right 03/21/2020   Procedure: RIGHT BREAST RECONSTRUCTION WITH PLACEMENT OF  TISSUE EXPANDER AND FLEX HD (ACELLULAR HYDRATED DERMIS);  Surgeon: Elisabeth Craig RAMAN, MD;  Location: [facility];  Service: Plastics;  Laterality: Right;   BREAST RECONSTRUCTION WITH PLACEMENT OF TISSUE EXPANDER AND FLEX HD (ACELLULAR HYDRATED DERMIS) Right 12/27/2020   Procedure: BREAST RECONSTRUCTION WITH PLACEMENT OF TISSUE EXPANDER AND  FLEX HD (ACELLULAR HYDRATED DERMIS);  Surgeon: Elisabeth Craig RAMAN, MD;  Location: [facility];  Service: Plastics;  Laterality: Right;   BREAST REDUCTION SURGERY Left 06/27/2021   Procedure: Left breast reduction;  Surgeon: Elisabeth Craig RAMAN, MD;  Location: [facility];  Service: Plastics;  Laterality: Left;   IMAGE GUIDED SINUS SURGERY     INCISION AND DRAINAGE OF WOUND Right 05/02/2020   Procedure: IRRIGATION AND DEBRIDEMENT WOUND WITH REMOVAL OF RIGHT TISSUE EXPANDER;  Surgeon: Elisabeth Craig RAMAN, MD;  Location: [facility];  Service: Plastics;  Laterality: Right;  1 hour please   KNEE SURGERY     MASTECTOMY Right 03/21/2020   NASAL SEPTUM SURGERY     PORTACATH PLACEMENT N/A 08/07/2019   Procedure: INSERTION PORT-A-CATH;  Surgeon: Rodolph Romano, MD;  Location: [facility];  Service: General;  Laterality: N/A;   REDUCTION MAMMAPLASTY     REMOVAL OF TISSUE EXPANDER AND PLACEMENT OF IMPLANT Right 06/27/2021   Procedure: Removal right breast tissue expander and placement of gel implant;  Surgeon: Elisabeth Craig RAMAN, MD;  Location: [facility];  Service: Plastics;  Laterality: Right;   TONSILLECTOMY     TOTAL MASTECTOMY Right 03/21/2020   Procedure: TOTAL MASTECTOMY;  Surgeon: Rodolph Romano, MD;  Location: [facility];  Service: General;  Laterality: Right;    Current Medications: Current Meds  Medication Sig   Accu-Chek FastClix Lancets MISC See admin instructions.   ACCU-CHEK GUIDE test strip SMARTSIG:1 Strip(s) Via Meter Daily PRN   amphetamine -dextroamphetamine  (ADDERALL XR) 25 MG 24 hr capsule Take 25 mg by mouth daily. (Patient taking differently: Take 20 mg by mouth daily. TOTAL 20MG )   azelastine (ASTELIN) 0.1 % nasal spray Place 1 spray into both nostrils daily.   Blood Glucose Monitoring Suppl (ACCU-CHEK GUIDE ME) w/Device KIT USE 1 DEVICE ONCE DAILY AS DIRECTED   busPIRone (BUSPAR) 10 MG tablet Take 10 mg by mouth 2 (two)  times daily.   Calcium  Carb-Cholecalciferol (CALCIUM  600+D) 600-800 MG-UNIT TABS Take 1 tablet by mouth daily.   cetirizine (ZYRTEC) 10 MG tablet Take 10 mg by mouth daily.    diazepam (VALIUM) 10 MG tablet Take 10 mg by mouth. As needed prior to procedures (Patient taking differently: Take 10 mg by mouth daily as needed. As needed prior to procedures)   doxepin  (SINEQUAN ) 50 MG capsule Take 150 mg by mouth at bedtime.   estradiol  (ESTRACE ) 0.1 MG/GM vaginal cream Insert 1g nightly for 1 wk, then 1 g once weekly as maintenance; can apply pea sized amount externally at vaginal opening weekly   ibuprofen  (ADVIL ) 600 MG tablet Take 1 tablet (600 mg total) by mouth every 6 (six) hours as needed.   JARDIANCE 10 MG TABS tablet Take 10 mg by mouth daily.   lamoTRIgine  (LAMICTAL ) 150 MG tablet Take 150 mg by mouth daily.   lurasidone  (LATUDA ) 80 MG TABS tablet Take 80 mg by mouth daily.   metFORMIN  (GLUCOPHAGE -XR) 500 MG 24 hr tablet Take 500 mg by mouth 2 (two) times daily.   mometasone (NASONEX) 50 MCG/ACT nasal spray Place 2 sprays into the nose daily as needed (allergies).    Multiple Vitamin (  MULTIVITAMIN WITH MINERALS) TABS tablet Take 1 tablet by mouth daily.   omeprazole (PRILOSEC) 20 MG capsule Take 20 mg by mouth daily before breakfast.    QUEtiapine  (SEROQUEL ) 300 MG tablet Take 300 mg by mouth at bedtime.   simvastatin  (ZOCOR ) 20 MG tablet Take 20 mg by mouth daily.   Suvorexant (BELSOMRA) 10 MG TABS Take by mouth.   topiramate  (TOPAMAX ) 100 MG tablet Take 50 mg by mouth 2 (two) times daily. Take 1 tablet (100 mg) by mouth in the morning & take 0.5 tablet (50 mg) by mouth at night.   vitamin B-12 (CYANOCOBALAMIN ) 1000 MCG tablet Take 1,000 mcg by mouth daily. (Patient taking differently: Take 1,000 mcg by mouth 3 (three) times a week.)    Allergies:   Compazine [prochlorperazine edisylate], Morphine and codeine, Famotidine , Doxycycline, Fluticasone, and Levofloxacin   Social History    Socioeconomic History   Marital status: Married    Spouse name: Not on file   Number of children: Not on file   Years of education: Not on file   Highest education level: Not on file  Occupational History   Not on file  Tobacco Use   Smoking status: Never   Smokeless tobacco: Never  Vaping Use   Vaping status: Never Used  Substance and Sexual Activity   Alcohol use: No    Alcohol/week: 0.0 standard drinks of alcohol   Drug use: No   Sexual activity: Yes  Other Topics Concern   Not on file  Social History Narrative   Not on file   Social Drivers of Health   Financial Resource Strain: Not on file  Food Insecurity: Not on file  Transportation Needs: No Transportation Needs (01/19/2022)   PRAPARE - Transportation    Lack of Transportation (Medical): No    Lack of Transportation (Non-Medical): No  Physical Activity: Not on file  Stress: Not on file  Social Connections: Not on file     Family History:  The patient's family history includes Heart attack in her father; Heart disease in her paternal uncle; Lung cancer in her father, paternal aunt, and paternal uncle. There is no history of Breast cancer.  ROS:   12-point review of systems is negative unless otherwise noted in the HPI.   EKGs/Labs/Other Studies Reviewed:    Studies reviewed were summarized above. The additional studies were reviewed today:  Zio patch 03/2024:   The patient was monitored for 13 days, 22 hours.   The predominant rhythm was sinus with an average rate of 101 bpm (range 70-165 bpm).   There were rare PACs and PVCs.   No sustained arrhythmia or prolonged pause was observed.   Patient triggered events correspond to sinus tachycardia, some of which also contain isolated PACs.   Predominantly sinus rhythm with elevated average heart rate.  Rare PACs and PVCs noted. __________  2D echo 01/28/2024: 1. Left ventricular ejection fraction, by estimation, is 55 to 60%. The  left ventricle has normal  function. The left ventricle has no regional  wall motion abnormalities. Left ventricular diastolic parameters are  indeterminate.   2. Right ventricular systolic function is normal. The right ventricular  size is normal.   3. The mitral valve is normal in structure. No evidence of mitral valve  regurgitation.   4. The aortic valve is tricuspid. Aortic valve regurgitation is not  visualized.   5. The inferior vena cava is normal in size with greater than 50%  respiratory variability, suggesting right atrial pressure  of 3 mmHg.  __________   2D echo 02/15/2023: 1. Left ventricular ejection fraction, by estimation, is 50 to 55%. The  left ventricle has low normal function. The left ventricle has no regional  wall motion abnormalities. Left ventricular diastolic parameters are  consistent with Grade I diastolic  dysfunction (impaired relaxation). The average left ventricular global  longitudinal strain is -17.1 %. The global longitudinal strain is normal.   2. Right ventricular systolic function is normal. The right ventricular  size is normal.   3. The mitral valve is normal in structure. No evidence of mitral valve  regurgitation.   4. The aortic valve is tricuspid. Aortic valve regurgitation is not  visualized.   5. The inferior vena cava is normal in size with greater than 50%  respiratory variability, suggesting right atrial pressure of 3 mmHg.  __________   2D echo 02/14/2022: 1. Left ventricular ejection fraction, by estimation, is 50 to 55%. The  left ventricle has low normal function. The left ventricle has no regional  wall motion abnormalities. Left ventricular diastolic parameters were  normal.   2. Right ventricular systolic function is normal. The right ventricular  size is normal.   3. The mitral valve is normal in structure. Trivial mitral valve  regurgitation.   4. The aortic valve is tricuspid. Aortic valve regurgitation is not  visualized.   5. The inferior vena  cava is normal in size with greater than 50%  respiratory variability, suggesting right atrial pressure of 3 mmHg. __________   2D echo 08/13/2019: 1. Left ventricular ejection fraction, by visual estimation, is 50 to  55%. The left ventricle has normal function. There is no left ventricular  hypertrophy.   2. The left ventricle demonstrates global hypokinesis.   3. Global right ventricle has normal systolic function.The right  ventricular size is normal. No increase in right ventricular wall  thickness.   4. Left atrial size was normal.   5. Right atrial size was normal.   6. The mitral valve is normal in structure. Trivial mitral valve  regurgitation. No evidence of mitral stenosis.   7. The tricuspid valve is normal in structure.   8. The aortic valve is normal in structure. Aortic valve regurgitation is  not visualized. No evidence of aortic valve sclerosis or stenosis.   9. The pulmonic valve was normal in structure. Pulmonic valve  regurgitation is not visualized.  10. The inferior vena cava is normal in size with greater than 50%  respiratory variability, suggesting right atrial pressure of 3 mmHg.   EKG:  EKG is ordered today.  The EKG ordered today demonstrates sinus rhythm, rate 95 bpm  Recent Labs: 04/07/2024: ALT 28; BUN 17; Creatinine 1.00; Hemoglobin 13.0; Platelet Count 367; Potassium 3.6; Sodium 137  Recent Lipid Panel No results found for: CHOL, TRIG, HDL, CHOLHDL, VLDL, LDLCALC, LDLDIRECT  PHYSICAL EXAM:    VS:  BP 110/68 (BP Location: Left Arm, Patient Position: Sitting, Cuff Size: Normal)   Pulse 95   Ht 5' 6 (1.676 m)   Wt 168 lb (76.2 kg)   LMP 01/19/2019 (Approximate) Comment: Over 2 years  SpO2 97%   BMI 27.12 kg/m   BMI: Body mass index is 27.12 kg/m.  Physical Exam Vitals and nursing note reviewed.  Constitutional:      General: She is not in acute distress.    Appearance: Normal appearance.  Cardiovascular:     Rate and  Rhythm: Normal rate and regular rhythm.  Heart sounds: No murmur heard. Pulmonary:     Effort: Pulmonary effort is normal. No respiratory distress.     Breath sounds: No wheezing or rales.  Musculoskeletal:     Right lower leg: No edema.     Left lower leg: No edema.  Skin:    General: Skin is warm and dry.  Neurological:     General: No focal deficit present.     Mental Status: She is alert and oriented to person, place, and time. Mental status is at baseline.  Psychiatric:        Mood and Affect: Mood normal.        Behavior: Behavior normal.     Wt Readings from Last 3 Encounters:  05/26/24 168 lb (76.2 kg)  04/14/24 168 lb (76.2 kg)  04/07/24 167 lb 8 oz (76 kg)     ASSESSMENT & PLAN:   Palpitations - Reports overall improvement in symptoms.  Recent Zio patch showed no evidence of arrhythmia with rare atrial and ventricular ectopy.  Patient triggered events corresponded to sinus tachycardia and isolated PACs.  Reassurance provided.  No further workup or medication changes indicated at this time.  Breast cancer with abnormal echo and at risk for cardiomyopathy - Initial concern for mildly reduced LVEF by echo in 2020 with subsequent MUGA scans demonstrating stable, low normal LV systolic function.  Most recent echo 01/2024 with EF 55 to 60%.  No symptoms of angina or cardiac decompensation.  Given normal LV function without symptoms, will defer the addition of beta-blocker or ARB at this time.  Especially in the setting of low-normal BP.  Mitral regurgitation - No significant valvular abnormality noted on echo in 01/2024.    Disposition: F/u with Dr. Mady or an APP in 1 year.   Medication Adjustments/Labs and Tests Ordered: Current medicines are reviewed at length with the patient today.  Concerns regarding medicines are outlined above. Medication changes, Labs and Tests ordered today are summarized above and listed in the Patient Instructions accessible in Encounters.    Bonney Lesley Maffucci, PA-C 05/26/2024 11:53 AM     [facility]  [address] [place], [place] [address] [phone]

## 2024-05-26 ENCOUNTER — Ambulatory Visit: Attending: Physician Assistant | Admitting: Physician Assistant

## 2024-05-26 VITALS — BP 110/68 | HR 95 | Ht 66.0 in | Wt 168.0 lb

## 2024-05-26 DIAGNOSIS — I340 Nonrheumatic mitral (valve) insufficiency: Secondary | ICD-10-CM

## 2024-05-26 DIAGNOSIS — Z9189 Other specified personal risk factors, not elsewhere classified: Secondary | ICD-10-CM

## 2024-05-26 DIAGNOSIS — R002 Palpitations: Secondary | ICD-10-CM | POA: Diagnosis not present

## 2024-05-26 NOTE — Patient Instructions (Signed)
 Medication Instructions:  Your physician recommends that you continue on your current medications as directed. Please refer to the Current Medication list given to you today.   *If you need a refill on your cardiac medications before your next appointment, please call your pharmacy*  Lab Work: None ordered at this time   Follow-Up: At [facility], you and your health needs are our priority.  As part of our continuing mission to provide you with exceptional heart care, our providers are all part of one team.  This team includes your primary Cardiologist (physician) and Advanced Practice Providers or APPs (Physician Assistants and Nurse Practitioners) who all work together to provide you with the care you need, when you need it.  Your next appointment:   1 year(s)  Provider:   You may see Lonni Hanson, MD or Lesley Maffucci, PA-C or Bernardino Bring, PA-C

## 2024-05-28 DIAGNOSIS — F4010 Social phobia, unspecified: Secondary | ICD-10-CM | POA: Diagnosis not present

## 2024-05-28 DIAGNOSIS — F3181 Bipolar II disorder: Secondary | ICD-10-CM | POA: Diagnosis not present

## 2024-05-28 DIAGNOSIS — F411 Generalized anxiety disorder: Secondary | ICD-10-CM | POA: Diagnosis not present

## 2024-06-08 DIAGNOSIS — F3181 Bipolar II disorder: Secondary | ICD-10-CM | POA: Diagnosis not present

## 2024-06-08 DIAGNOSIS — F5104 Psychophysiologic insomnia: Secondary | ICD-10-CM | POA: Diagnosis not present

## 2024-06-08 DIAGNOSIS — F902 Attention-deficit hyperactivity disorder, combined type: Secondary | ICD-10-CM | POA: Diagnosis not present

## 2024-06-08 DIAGNOSIS — F419 Anxiety disorder, unspecified: Secondary | ICD-10-CM | POA: Diagnosis not present

## 2024-06-11 DIAGNOSIS — F3181 Bipolar II disorder: Secondary | ICD-10-CM | POA: Diagnosis not present

## 2024-06-11 DIAGNOSIS — F411 Generalized anxiety disorder: Secondary | ICD-10-CM | POA: Diagnosis not present

## 2024-06-11 DIAGNOSIS — F4010 Social phobia, unspecified: Secondary | ICD-10-CM | POA: Diagnosis not present

## 2024-06-24 DIAGNOSIS — F411 Generalized anxiety disorder: Secondary | ICD-10-CM | POA: Diagnosis not present

## 2024-06-24 DIAGNOSIS — F3181 Bipolar II disorder: Secondary | ICD-10-CM | POA: Diagnosis not present

## 2024-06-24 DIAGNOSIS — F4010 Social phobia, unspecified: Secondary | ICD-10-CM | POA: Diagnosis not present

## 2024-07-08 DIAGNOSIS — F332 Major depressive disorder, recurrent severe without psychotic features: Secondary | ICD-10-CM | POA: Diagnosis not present

## 2024-07-08 DIAGNOSIS — F411 Generalized anxiety disorder: Secondary | ICD-10-CM | POA: Diagnosis not present

## 2024-07-15 DIAGNOSIS — F3181 Bipolar II disorder: Secondary | ICD-10-CM | POA: Diagnosis not present

## 2024-07-22 DIAGNOSIS — F332 Major depressive disorder, recurrent severe without psychotic features: Secondary | ICD-10-CM | POA: Diagnosis not present

## 2024-07-22 DIAGNOSIS — F411 Generalized anxiety disorder: Secondary | ICD-10-CM | POA: Diagnosis not present

## 2024-08-06 DIAGNOSIS — F902 Attention-deficit hyperactivity disorder, combined type: Secondary | ICD-10-CM | POA: Diagnosis not present

## 2024-08-06 DIAGNOSIS — F5104 Psychophysiologic insomnia: Secondary | ICD-10-CM | POA: Diagnosis not present

## 2024-08-06 DIAGNOSIS — F419 Anxiety disorder, unspecified: Secondary | ICD-10-CM | POA: Diagnosis not present

## 2024-08-06 DIAGNOSIS — F3181 Bipolar II disorder: Secondary | ICD-10-CM | POA: Diagnosis not present

## 2025-04-07 ENCOUNTER — Ambulatory Visit: Admitting: Internal Medicine

## 2025-04-07 ENCOUNTER — Other Ambulatory Visit
